# Patient Record
Sex: Female | Born: 1960
Health system: Southern US, Community
[De-identification: ages and names within clinical notes are randomized; demographics above are authoritative.]

## PROBLEM LIST (undated history)

## (undated) DIAGNOSIS — F32A Depression, unspecified: Secondary | ICD-10-CM

## (undated) DIAGNOSIS — J449 Chronic obstructive pulmonary disease, unspecified: Secondary | ICD-10-CM

## (undated) DIAGNOSIS — F988 Other specified behavioral and emotional disorders with onset usually occurring in childhood and adolescence: Secondary | ICD-10-CM

## (undated) DIAGNOSIS — F209 Schizophrenia, unspecified: Secondary | ICD-10-CM

## (undated) DIAGNOSIS — R569 Unspecified convulsions: Secondary | ICD-10-CM

## (undated) DIAGNOSIS — F329 Major depressive disorder, single episode, unspecified: Secondary | ICD-10-CM

## (undated) DIAGNOSIS — F319 Bipolar disorder, unspecified: Secondary | ICD-10-CM

## (undated) DIAGNOSIS — F419 Anxiety disorder, unspecified: Secondary | ICD-10-CM

## (undated) DIAGNOSIS — G8929 Other chronic pain: Secondary | ICD-10-CM

## (undated) DIAGNOSIS — F1111 Opioid abuse, in remission: Secondary | ICD-10-CM

## (undated) HISTORY — PX: BACK SURGERY: SHX140

---

## 2000-09-10 ENCOUNTER — Encounter: Payer: Self-pay | Admitting: Internal Medicine

## 2000-09-10 ENCOUNTER — Ambulatory Visit (HOSPITAL_COMMUNITY): Admission: RE | Admit: 2000-09-10 | Discharge: 2000-09-10 | Payer: Self-pay | Admitting: Internal Medicine

## 2000-11-09 ENCOUNTER — Emergency Department (HOSPITAL_COMMUNITY): Admission: EM | Admit: 2000-11-09 | Discharge: 2000-11-09 | Payer: Self-pay | Admitting: *Deleted

## 2000-11-09 ENCOUNTER — Encounter: Payer: Self-pay | Admitting: *Deleted

## 2002-03-20 ENCOUNTER — Emergency Department (HOSPITAL_COMMUNITY): Admission: EM | Admit: 2002-03-20 | Discharge: 2002-03-20 | Payer: Self-pay | Admitting: Emergency Medicine

## 2003-07-17 ENCOUNTER — Other Ambulatory Visit: Admission: RE | Admit: 2003-07-17 | Discharge: 2003-07-17 | Payer: Self-pay | Admitting: Internal Medicine

## 2005-10-06 ENCOUNTER — Ambulatory Visit (HOSPITAL_COMMUNITY): Admission: RE | Admit: 2005-10-06 | Discharge: 2005-10-06 | Payer: Self-pay | Admitting: Family Medicine

## 2006-11-09 ENCOUNTER — Ambulatory Visit: Payer: Self-pay | Admitting: Pain Medicine

## 2010-04-25 ENCOUNTER — Encounter: Payer: Self-pay | Admitting: Family Medicine

## 2011-08-05 ENCOUNTER — Encounter (HOSPITAL_COMMUNITY): Payer: Self-pay | Admitting: *Deleted

## 2011-08-05 ENCOUNTER — Emergency Department (HOSPITAL_COMMUNITY)
Admission: EM | Admit: 2011-08-05 | Discharge: 2011-08-05 | Disposition: A | Discharge status: Other Acute Inpatient Hospital | Payer: Medicare Other | Source: Home / Self Care

## 2011-08-05 ENCOUNTER — Encounter (HOSPITAL_COMMUNITY): Payer: Self-pay | Admitting: Emergency Medicine

## 2011-08-05 ENCOUNTER — Inpatient Hospital Stay (HOSPITAL_COMMUNITY)
Admission: AD | Admit: 2011-08-05 | Discharge: 2011-08-08 | DRG: 885 | Disposition: A | Discharge status: Home / Self Care | Payer: Medicare Other | Source: Ambulatory Visit | Attending: Psychiatry | Admitting: Psychiatry

## 2011-08-05 DIAGNOSIS — Z91199 Patient's noncompliance with other medical treatment and regimen due to unspecified reason: Secondary | ICD-10-CM

## 2011-08-05 DIAGNOSIS — F1111 Opioid abuse, in remission: Secondary | ICD-10-CM | POA: Diagnosis present

## 2011-08-05 DIAGNOSIS — Z9119 Patient's noncompliance with other medical treatment and regimen: Secondary | ICD-10-CM

## 2011-08-05 DIAGNOSIS — F988 Other specified behavioral and emotional disorders with onset usually occurring in childhood and adolescence: Secondary | ICD-10-CM | POA: Diagnosis present

## 2011-08-05 DIAGNOSIS — G8929 Other chronic pain: Secondary | ICD-10-CM | POA: Insufficient documentation

## 2011-08-05 DIAGNOSIS — F17200 Nicotine dependence, unspecified, uncomplicated: Secondary | ICD-10-CM | POA: Diagnosis present

## 2011-08-05 DIAGNOSIS — F200 Paranoid schizophrenia: Secondary | ICD-10-CM | POA: Diagnosis present

## 2011-08-05 DIAGNOSIS — Z79899 Other long term (current) drug therapy: Secondary | ICD-10-CM | POA: Insufficient documentation

## 2011-08-05 DIAGNOSIS — F259 Schizoaffective disorder, unspecified: Principal | ICD-10-CM | POA: Diagnosis present

## 2011-08-05 DIAGNOSIS — F319 Bipolar disorder, unspecified: Secondary | ICD-10-CM | POA: Insufficient documentation

## 2011-08-05 DIAGNOSIS — R443 Hallucinations, unspecified: Secondary | ICD-10-CM | POA: Insufficient documentation

## 2011-08-05 DIAGNOSIS — Z9114 Patient's other noncompliance with medication regimen: Secondary | ICD-10-CM

## 2011-08-05 HISTORY — DX: Opioid abuse, in remission: F11.11

## 2011-08-05 HISTORY — DX: Bipolar disorder, unspecified: F31.9

## 2011-08-05 HISTORY — DX: Other chronic pain: G89.29

## 2011-08-05 HISTORY — DX: Other specified behavioral and emotional disorders with onset usually occurring in childhood and adolescence: F98.8

## 2011-08-05 LAB — CBC
HCT: 42.8 % (ref 36.0–46.0)
MCH: 32.2 pg (ref 26.0–34.0)
MCHC: 34.3 g/dL (ref 30.0–36.0)
MCV: 93.9 fL (ref 78.0–100.0)
Platelets: 397 10*3/uL (ref 150–400)
RDW: 13.6 % (ref 11.5–15.5)
WBC: 9.4 10*3/uL (ref 4.0–10.5)

## 2011-08-05 LAB — COMPREHENSIVE METABOLIC PANEL
AST: 23 U/L (ref 0–37)
Albumin: 3.3 g/dL — ABNORMAL LOW (ref 3.5–5.2)
BUN: 7 mg/dL (ref 6–23)
Calcium: 9.3 mg/dL (ref 8.4–10.5)
Creatinine, Ser: 0.65 mg/dL (ref 0.50–1.10)
Total Bilirubin: 0.2 mg/dL — ABNORMAL LOW (ref 0.3–1.2)
Total Protein: 7.4 g/dL (ref 6.0–8.3)

## 2011-08-05 LAB — RAPID URINE DRUG SCREEN, HOSP PERFORMED
Amphetamines: POSITIVE — AB
Cocaine: NOT DETECTED
Opiates: NOT DETECTED
Tetrahydrocannabinol: NOT DETECTED

## 2011-08-05 LAB — ETHANOL: Alcohol, Ethyl (B): <11 mg/dL (ref 0–11)

## 2011-08-05 MED ORDER — ACETAMINOPHEN 325 MG PO TABS: 650.0000 mg | ORAL_TABLET | ORAL | Status: DC | PRN

## 2011-08-05 MED ORDER — METHYLPHENIDATE HCL 20 MG PO TABS: 30.0000 mg | ORAL_TABLET | Freq: Two times a day (BID) | ORAL | Status: DC

## 2011-08-05 MED ORDER — ZIPRASIDONE HCL 80 MG PO CAPS
80.0000 mg | ORAL_CAPSULE | Freq: Every day | ORAL | Status: DC
  Filled 2011-08-05: qty 1

## 2011-08-05 MED ORDER — LORAZEPAM 1 MG PO TABS: 1.0000 mg | ORAL_TABLET | Freq: Three times a day (TID) | ORAL | Status: DC | PRN

## 2011-08-05 MED ORDER — ASPIRIN-ACETAMINOPHEN-CAFFEINE 250-250-65 MG PO TABS
2.0000 | ORAL_TABLET | Freq: Four times a day (QID) | ORAL | Status: DC | PRN
  Administered 2011-08-05 – 2011-08-08 (×10): 2 via ORAL
  Filled 2011-08-05 (×5): qty 2

## 2011-08-05 MED ORDER — ALUM & MAG HYDROXIDE-SIMETH 200-200-20 MG/5ML PO SUSP: 30.0000 mL | ORAL | Status: DC | PRN

## 2011-08-05 MED ORDER — CLONAZEPAM 1 MG PO TABS
1.0000 mg | ORAL_TABLET | Freq: Once | ORAL | Status: AC
  Administered 2011-08-05 – 2011-08-06 (×2): 1 mg via ORAL
  Filled 2011-08-05: qty 1

## 2011-08-05 MED ORDER — FLUOXETINE HCL 20 MG PO CAPS
40.0000 mg | ORAL_CAPSULE | Freq: Every day | ORAL | Status: DC
  Administered 2011-08-06 – 2011-08-08 (×3): 40 mg via ORAL
  Filled 2011-08-05 (×6): qty 2

## 2011-08-05 MED ORDER — FLUOXETINE HCL 20 MG PO CAPS
40.0000 mg | ORAL_CAPSULE | Freq: Every day | ORAL | Status: DC
  Administered 2011-08-05: 40 mg via ORAL
  Filled 2011-08-05: qty 2

## 2011-08-05 MED ORDER — NICOTINE 21 MG/24HR TD PT24: 21.0000 mg | MEDICATED_PATCH | Freq: Every day | TRANSDERMAL | Status: DC

## 2011-08-05 MED ORDER — ZIPRASIDONE HCL 60 MG PO CAPS
60.0000 mg | ORAL_CAPSULE | Freq: Once | ORAL | Status: AC
  Administered 2011-08-05: 60 mg via ORAL
  Filled 2011-08-05 (×2): qty 1

## 2011-08-05 MED ORDER — MAGNESIUM HYDROXIDE 400 MG/5ML PO SUSP
30.0000 mL | Freq: Every day | ORAL | Status: DC | PRN
Start: 2011-08-05 — End: 2011-08-08

## 2011-08-05 MED ORDER — ONDANSETRON HCL 8 MG PO TABS: 4.0000 mg | ORAL_TABLET | Freq: Three times a day (TID) | ORAL | Status: DC | PRN

## 2011-08-05 MED ORDER — ZIPRASIDONE MESYLATE 20 MG IM SOLR
20.0000 mg | Freq: Once | INTRAMUSCULAR | Status: AC
  Administered 2011-08-05: 20 mg via INTRAMUSCULAR
  Filled 2011-08-05: qty 20

## 2011-08-05 NOTE — ED Notes (Signed)
Per Irving Burton from ACT team, patient has been accepted at behavioral health pending she is agreeable to stopping her suboxone and ritalin prescriptions. Pt very agitated and yelling she doesn't want to stop these medications. Pt educated that her withdrawal symptoms would be managed by her doctors. Pt finally agreeable to plan to transfer to [facility]. Irving Burton made aware and will start paper work.

## 2011-08-05 NOTE — ED Notes (Signed)
Assumed care of patient from [place], [place]. Pt very pleasant. States she was told by her psychiatrist she needed to come to the ER because she has been hearing voices for the last 2 months. Pt states the voices having been accusing her of selling oxycodone and tell her the police are watching her. Pt reports she has been clean from drugs and alcohol for 5 years. Pt denies suicidal or homicidal ideation. Pt expressed no needs at present. Will continue to monitor.

## 2011-08-05 NOTE — BH Assessment (Signed)
Assessment Note   Christine Ortega is an 51 y.o. female that presents at the request of her Psychiatrist and PCP to address increasing A/V hallucinations and "voices that are driving me crazy."  Pt has become increasingly paranoid, irritable, and agitated while in ED and believes that the staff are talking negatively about her.  Pt admits that she attempted to wean herself off of her Geodon "because it was just one more pill that I was forced to take."  Pt realizes now that "this was a bad idea."  Pt has been responding to internal stimuli while in ED and is yelling at and threatening the voices.  Pt denies SI, HI, or previous history of same.  Pt does admits history of Opioid Abuse, but is currently taking prescribed Suboxene "religiously" and voices over five years of sobriety from ETOH and "pain pills."  Pt is currently able to contract for safety but is becoming difficult to verbal handle, as she continued to scream and yell at "the voices" until she was given 20mg  of Geodon IM.  Pt is also requesting her Flexeril and her Ritalin and "then I will be A-okay."  Telepsych requested by Grant Fontana, PA-C; awaiting disposition regarding medication and placement needs.  However, pt would benefit from inpatient care to treat current and worsening symptoms.  Axis I: Mood Disorder NOS Axis II: Deferred Axis III:  Past Medical History  Diagnosis Date  . Narcotic abuse in remission   . Bipolar disorder   . Chronic pain   . ADD (attention deficit disorder)    Axis IV: other psychosocial or environmental problems, problems related to social environment, problems with access to health care services and problems with primary support group Axis V: 21-30 behavior considerably influenced by delusions or hallucinations OR serious impairment in judgment, communication OR inability to function in almost all areas  Past Medical History:  Past Medical History  Diagnosis Date  . Narcotic abuse in remission     . Bipolar disorder   . Chronic pain   . ADD (attention deficit disorder)     History reviewed. No pertinent past surgical history.  Family History: History reviewed. No pertinent family history.  Social History:  reports that she has been smoking.  She does not have any smokeless tobacco history on file. She reports that she does not drink alcohol or use illicit drugs.  Additional Social History:    Allergies:  Allergies  Allergen Reactions  . Ibuprofen Anaphylaxis    Home Medications:  (Not in a hospital admission)  OB/GYN Status:  No LMP recorded.  General Assessment Data Location of Assessment: [facility] ED Living Arrangements: Alone Can pt return to current living arrangement?: Yes Admission Status: Voluntary Is patient capable of signing voluntary admission?: Yes Transfer from: Acute Hospital Referral Source: MD  Education Status Is patient currently in school?: No  Risk to self Suicidal Ideation: No Suicidal Intent: No Is patient at risk for suicide?: No Suicidal Plan?: No Access to Means: No What has been your use of drugs/alcohol within the last 12 months?:  (history of Opioid Abuse; on Syboxen) Previous Attempts/Gestures: No How many times?: 0  Other Self Harm Risks: reckless/ A/V hallucinations Family Suicide History: No Recent stressful life event(s): Conflict (Comment);Recent negative physical changes Persecutory voices/beliefs?: Yes Depression: Yes Depression Symptoms: Insomnia;Tearfulness;Guilt;Feeling worthless/self pity;Feeling angry/irritable Substance abuse history and/or treatment for substance abuse?: Yes Suicide prevention information given to non-admitted patients: Not applicable  Risk to Others Homicidal Ideation: No Thoughts of Harm  to Others: No Current Homicidal Intent: No Current Homicidal Plan: No Access to Homicidal Means: No History of harm to others?: No Assessment of Violence: None Noted Violent Behavior Description: none  noted Does patient have access to weapons?: No Criminal Charges Pending?: No Does patient have a court date: No  Psychosis Hallucinations: Auditory;With command Delusions: Persecutory  Mental Status Report Appear/Hygiene: Improved Eye Contact: Good Motor Activity: Agitation;Restlessness Speech: Argumentative;Loud Level of Consciousness: Alert;Restless;Crying;Irritable Mood: Depressed;Anxious;Suspicious;Angry;Despair Affect: Apathetic;Depressed;Irritable;Inconsistent with thought content;Preoccupied;Sad;Threatening Anxiety Level: Severe Thought Processes: Irrelevant;Flight of Ideas Judgement: Impaired Orientation: Person;Place;Time;Situation Obsessive Compulsive Thoughts/Behaviors: Severe  Cognitive Functioning Concentration: Decreased Memory: Recent Impaired;Remote Impaired IQ: Average Insight: Poor Impulse Control: Poor Appetite: Good Weight Loss: 0  Weight Gain: 0  Sleep: Decreased Total Hours of Sleep: 4  Vegetative Symptoms: None  Prior Inpatient Therapy Prior Inpatient Therapy: No Prior Therapy Dates: n/a Prior Therapy Facilty/Provider(s): none Reason for Treatment: n/a  Prior Outpatient Therapy Prior Outpatient Therapy: Yes Prior Therapy Dates: currently Prior Therapy Facilty/Provider(s): Dr. Orvan Falconer Reason for Treatment: depression/anxiety/SA            Values / Beliefs Cultural Requests During Hospitalization: None Spiritual Requests During Hospitalization: None        Additional Information 1:1 In Past 12 Months?: No CIRT Risk: No Elopement Risk: No Does patient have medical clearance?: Yes     Disposition:  Disposition Disposition of Patient: Referred to Patient referred to: Other (Comment)  On Site Evaluation by:   Reviewed with Physician:     Angelica Ran 08/05/2011 3:38 PM

## 2011-08-05 NOTE — ED Notes (Signed)
Pt here with auditory hallucinations and paranoia x 2 months that went to PCP for today and sent here for eval; pt denies SI/HI; pt calm cooperative; pt sts hx of narcotic abuse in past but now on suboxone

## 2011-08-05 NOTE — ED Notes (Signed)
Pt found agitated, yelling in room at no one. Pt obviously having auditory hallucinations, very tearful and paranoid staff is talking about her. Grant Fontana PA-C made aware. New orders received to medicate patient with geodon.

## 2011-08-05 NOTE — ED Provider Notes (Signed)
History     CSN: [id number]  Arrival date & time 08/05/11  1043   First MD Initiated Contact with Patient 08/05/11 1228      Chief Complaint  Patient presents with  . Medical Clearance  . Hallucinations    (Consider location/radiation/quality/duration/timing/severity/associated sxs/prior treatment) HPI Hx from pt. 51 year old female with past medical history of bipolar disorder, ADD presents for medical clearance with c/o hallucinations. She states that this began about 2 months ago. She states that she has a remote history of opioid abuse, and has not used opioids within the past 5 years. She is having hallucinations, both auditory and visual, of policemen coming to her house, accusing her of selling oxycodone. States they are in the woods outside her house and have come into her attic. She states "I know this is really silly. My family tells me that they can't see them and they make fun of me." States "I feel like I'm having a psychotic break."   She has been on Geodon for about the past 15 years for her bipolar, but has been "weaning herself off" over about the past month. She is unable to tell me a reason why she has done this. She has never had hallucinations in the past. Denies any recreational drug use or alcohol use. Denies suicidal or homicidal ideation. She has no somatic complaints.  Past Medical History  Diagnosis Date  . Narcotic abuse in remission   . Bipolar disorder   . Chronic pain   . ADD (attention deficit disorder)     History reviewed. No pertinent past surgical history.  History reviewed. No pertinent family history.  History  Substance Use Topics  . Smoking status: Current Everyday Smoker  . Smokeless tobacco: Not on file  . Alcohol Use: No    OB History    Grav Para Term Preterm Abortions TAB SAB Ect Mult Living                  Review of Systems  Constitutional: Negative.   HENT: Negative.   Eyes: Negative.   Respiratory: Negative for cough,  chest tightness and shortness of breath.   Cardiovascular: Negative for chest pain and palpitations.  Gastrointestinal: Negative for nausea, vomiting and abdominal pain.  Musculoskeletal: Negative for myalgias.  Skin: Negative for color change and rash.  Neurological: Negative for weakness and headaches.    Allergies  Ibuprofen  Home Medications   Current Outpatient Rx  Name Route Sig Dispense Refill  . ASPIRIN-ACETAMINOPHEN-CAFFEINE 250-250-65 MG PO TABS Oral Take 2 tablets by mouth 4 (four) times daily.    Marland Kitchen BUPRENORPHINE HCL-NALOXONE HCL 8-2 MG SL FILM Sublingual Place 1 Film under the tongue daily.    Marland Kitchen CLONAZEPAM 1 MG PO TABS Oral Take 1 mg by mouth 3 (three) times daily.    Marland Kitchen FLUOXETINE HCL 40 MG PO CAPS Oral Take 40 mg by mouth daily.    . METHYLPHENIDATE HCL 20 MG PO TABS Oral Take 30 mg by mouth 2 (two) times daily.    Marland Kitchen ZIPRASIDONE HCL 80 MG PO CAPS Oral Take 80 mg by mouth at bedtime.      BP 110/76  Pulse 96  Temp(Src) 97.8 F (36.6 C) (Oral)  Resp 18  SpO2 98%  Physical Exam  Nursing note and vitals reviewed. Constitutional: She is oriented to person, place, and time. She appears well-developed and well-nourished. No distress.  HENT:  Head: Normocephalic and atraumatic.  Eyes: EOM are normal. Pupils are equal, round,  and reactive to light.  Neck: Normal range of motion. Neck supple.  Cardiovascular: Normal rate, regular rhythm and normal heart sounds.   Pulmonary/Chest: Effort normal and breath sounds normal. She exhibits no tenderness.  Abdominal: Soft. Bowel sounds are normal. There is no tenderness. There is no rebound and no guarding.  Musculoskeletal: Normal range of motion.  Neurological: She is alert and oriented to person, place, and time. No cranial nerve deficit. She exhibits normal muscle tone. Coordination normal.  Skin: Skin is warm and dry. She is not diaphoretic.  Psychiatric: She has a normal mood and affect.       Pt cooperative, not actively  hallucinating at this time    ED Course  Procedures (including critical care time)  Labs Reviewed  COMPREHENSIVE METABOLIC PANEL - Abnormal; Notable for the following:    Potassium 3.3 (*)    Albumin 3.3 (*)    Alkaline Phosphatase 157 (*)    Total Bilirubin 0.2 (*)    All other components within normal limits  URINE RAPID DRUG SCREEN (HOSP PERFORMED) - Abnormal; Notable for the following:    Amphetamines POSITIVE (*)    All other components within normal limits  CBC  ETHANOL  ACETAMINOPHEN LEVEL  Pt is on Ritalin to explain +UDS  No results found.   No diagnosis found.    MDM  Pt with c/o hallucinations. Medically clear for eval. Discussed with ACT counselor; telepsych ordered.        Grant Fontana, [place] 08/05/11 1556

## 2011-08-06 DIAGNOSIS — F3160 Bipolar disorder, current episode mixed, unspecified: Secondary | ICD-10-CM

## 2011-08-06 DIAGNOSIS — Z9114 Patient's other noncompliance with medication regimen: Secondary | ICD-10-CM | POA: Insufficient documentation

## 2011-08-06 MED ORDER — CYCLOBENZAPRINE HCL 10 MG PO TABS
10.0000 mg | ORAL_TABLET | Freq: Three times a day (TID) | ORAL | Status: DC
  Administered 2011-08-06 – 2011-08-07 (×6): 10 mg via ORAL
  Filled 2011-08-06 (×12): qty 1

## 2011-08-06 MED ORDER — CLONAZEPAM 1 MG PO TABS
1.0000 mg | ORAL_TABLET | Freq: Three times a day (TID) | ORAL | Status: DC
  Administered 2011-08-06 – 2011-08-07 (×5): 1 mg via ORAL
  Filled 2011-08-06 (×5): qty 1

## 2011-08-06 MED ORDER — CYCLOBENZAPRINE HCL 10 MG PO TABS
10.0000 mg | ORAL_TABLET | Freq: Once | ORAL | Status: DC
  Filled 2011-08-06: qty 1

## 2011-08-06 MED ORDER — ZIPRASIDONE HCL 80 MG PO CAPS
80.0000 mg | ORAL_CAPSULE | Freq: Every day | ORAL | Status: DC
  Administered 2011-08-06 – 2011-08-07 (×2): 80 mg via ORAL
  Filled 2011-08-06 (×5): qty 1

## 2011-08-06 MED ORDER — FLUOXETINE HCL 40 MG PO CAPS: 40.0000 mg | ORAL_CAPSULE | Freq: Every day | ORAL | Status: DC

## 2011-08-06 MED ORDER — CLONAZEPAM 1 MG PO TABS
ORAL_TABLET | ORAL | Status: AC
  Administered 2011-08-06: 1 mg via ORAL
  Filled 2011-08-06: qty 1

## 2011-08-06 MED ORDER — CLONAZEPAM 1 MG PO TABS: 1.0000 mg | ORAL_TABLET | Freq: Three times a day (TID) | ORAL | Status: DC

## 2011-08-06 MED ORDER — ZIPRASIDONE HCL 80 MG PO CAPS: 80.0000 mg | ORAL_CAPSULE | Freq: Every day | ORAL | Status: DC

## 2011-08-06 NOTE — H&P (Signed)
Psychiatric Admission Assessment Adult  Patient Identification:  Christine Ortega Date of Evaluation:  08/06/2011 51 yo MWF  CC: hallucinations  History of Present Illness: Weaned herself off Geodon that she has taken for 15 years this past month. Now relizes this was a bad idea.  Is now having hallucinations- both auditory and visual of policemen coming to her house accusing her of selling Oxycodone.She has not used opioids for at least 5 years. Feels like she is having a psychotic break.     Past Psychiatric History: Says sh has been admitted for rehab purposes but not for at least 5 years. Diagnosed as Bipolar & ADD age 85.   Substance Abuse History: 5 years since last Opioid use.   Social History:    reports that she has been smoking.  She does not have any smokeless tobacco history on file. She reports that she does not drink alcohol or use illicit drugs. Married once has  daughters 9 & 65 one son age 81.  Gets SSDI for low back pain had a tumor removed and this caused opiate dependence.    Family Psych History: Denies   Past Medical History:     Past Medical History  Diagnosis Date  . Narcotic abuse in remission   . Bipolar disorder   . Chronic pain   . ADD (attention deficit disorder)        Past Surgical History  Procedure Date  . Back surgery     Allergies:  Allergies  Allergen Reactions  . Ibuprofen Anaphylaxis  . Nsaids Anaphylaxis    Allergic to NSAIDs except for Excedrin    Current Medications:  Prior to Admission medications   Medication Sig Start Date End Date Taking? Authorizing Provider  aspirin-acetaminophen-caffeine (EXCEDRIN MIGRAINE) [phone] MG per tablet Take 2 tablets by mouth 4 (four) times daily.    Historical Provider, MD  Buprenorphine HCl-Naloxone HCl (SUBOXONE) 8-2 MG FILM Place 1 Film under the tongue daily.    Historical Provider, MD  clonazePAM (KLONOPIN) 1 MG tablet Take 1 mg by mouth 3 (three) times daily.    Historical  Provider, MD  FLUoxetine (PROZAC) 40 MG capsule Take 40 mg by mouth daily.    Historical Provider, MD  methylphenidate (RITALIN) 20 MG tablet Take 30 mg by mouth 2 (two) times daily.    Historical Provider, MD  ziprasidone (GEODON) 80 MG capsule Take 80 mg by mouth at bedtime.    Historical Provider, MD    Mental Status Examination/Evaluation: Objective:  Appearance: Disheveled  Psychomotor Activity:  Mannerisms  Eye Contact::  Good  Speech:  Clear and Coherent  Volume:  Normal  Mood:ramps up easily     Affect:  Full Range  Thought Process: clear rational goal oriented - asks for discharge   Orientation:  Full  Thought Content:  Hallucinations: Auditory Visual  Suicidal Thoughts:  No  Homicidal Thoughts:  No  Judgement:  Fair  Insight:  Fair    DIAGNOSIS:    AXIS I Bipolar, mixed  AXIS II Deferred  AXIS III See medical history.  AXIS IV other psychosocial or environmental problems, problems with access to health care services and problems with primary support group  AXIS V 21-30 behavior considerably influenced by delusions or hallucinations OR serious impairment in judgment, communication OR inability to function in almost all areas     Treatment Plan Summary: Admit for safety & stabilization  Dr.Zarzar had already restarted Prozac 40 mg QAm Flexeril 10 mg TID Geodon 80  mg at hs and Klonopin 1mg  TID.Ritalin and Suboxone to be held while inpatient.

## 2011-08-06 NOTE — Progress Notes (Signed)
[facility] Group Notes:  (Counselor/Nursing/MHT/Case Management/Adjunct)  08/06/2011 4:31 PM  Type of Therapy:  Group Therapy  Participation Level:  Active  Participation Quality:  Appropriate  Affect:  Appropriate  Cognitive:  Appropriate  Insight:  Good  Engagement in Group:  Good  Engagement in Therapy:  Good  Modes of Intervention:  Clarification, Problem-solving, Socialization and Support  Summary of Progress/Problems:Pt.  participated in counseling group on self sabotaging behaviors and how to support themselves in a positive way. Pt. spoke about how she does not take her medication like she should as a way she self sabotage herself. Pt.s spoke about taking her medications when she is supposed to as a way of positively supporting her self when needed.  Neila Gear 08/06/2011, 4:31 PM

## 2011-08-06 NOTE — [facility] Suicide Risk Assessment (Signed)
Suicide Risk Assessment  Admission Assessment     Demographic factors:  Assessment Details Time of Assessment: Admission Information Obtained From: Patient Current Mental Status:    Loss Factors:  Loss Factors: Decline in physical health Historical Factors:  Historical Factors: Family history of mental illness or substance abuse Risk Reduction Factors:  Risk Reduction Factors: Responsible for children under 51 years of age;Sense of responsibility to family;Positive social support;Positive therapeutic relationship  CLINICAL FACTORS:   Bipolar Disorder:   Depressive phase Alcohol/Substance Abuse/Dependencies Chronic Pain More than one psychiatric diagnosis  Christine Ortega was seen and assessed today. She reports self-discontinuing Geodon, thinking that she could live without it, and then having worsening auditory hallucinations of voices talking about her prior addiction. She denies AVH today. She slept well last night and reports good mood, appetite, and energy level. She does complain of back pain and requests Flexeril. She also asks if Ritalin and Suboxone can be restarted and I explained that we would not be restarting these medications here. I agreed to restart Klonopin after confirming the patient's dose through the [place] controlled substance database.  COGNITIVE FEATURES THAT CONTRIBUTE TO RISK:  Closed-mindedness    SUICIDE RISK:   Mild:  Suicidal ideation of limited frequency, intensity, duration, and specificity.  There are no identifiable plans, no associated intent, mild dysphoria and related symptoms, good self-control (both objective and subjective assessment), few other risk factors, and identifiable protective factors, including available and accessible social support.  PLAN OF CARE: 1. Restart Prozac 40 mg qam, Flexeril 10 mg TID, Geodon 80 mg qhs, and Klonopin 1 mg po TID, which are the patient's outpatient meds. Will not restart Suboxone or Ritalin while inpatient. 2. Continue  q15 minute observation for safety. The patient currently denies SI/HI. 3. Encouraged participation in [facility] groups. 4. Reviewed admission labs.  Christine Ortega 08/06/2011, 1:13 PM

## 2011-08-06 NOTE — H&P (Signed)
I have read the H&P, interviewed the patient, and I agree with the findings above.  Jamerson Vonbargen, MD   

## 2011-08-06 NOTE — ED Provider Notes (Signed)
Medical screening examination/treatment/procedure(s) were performed by non-physician practitioner and as supervising physician I was immediately available for consultation/collaboration.   Alassane Kalafut A Farha Dano, MD 08/06/11 1242 

## 2011-08-06 NOTE — Progress Notes (Signed)
[facility] Group Notes:  (Counselor/Nursing/MHT/Case Management/Adjunct)  08/06/2011 4:54 PM  Type of Therapy:  After care Planning group    Summary of Progress/Problems: Pt. participated in after care planning group. Pt. Was give the [facility] SI  Prevention information and the crisis and hot line numbers. Pt. Agreed to use them if needed.  The pt. Spoke about being at hospital due to not taking her mediations. Pt. Denied SI or HI. Pt. Was told she would see a doctor today.  Neila Gear 08/06/2011, 4:54 PM

## 2011-08-06 NOTE — Progress Notes (Signed)
Pt has been very anxious and upset today  She was asking for her medications ritalin and suboxone to be restarted but then later admitted to wanting to come off the suboxone   Her mood is labile  She has been tearful and saying staff just doesn't care about her   Then she becomes calm and logical   She does talk about all the stress she has at home with a disabled 51 year old son  She reports having a good relationship with her husband  She said she made a mistake going off her geodon and plans to be compliant with medications once discharged   Verbal support given   Medications administered and effectiveness monitored  Q 15 min checks   Pt safe at present

## 2011-08-07 MED ORDER — CYCLOBENZAPRINE HCL 10 MG PO TABS
10.0000 mg | ORAL_TABLET | ORAL | Status: DC
  Administered 2011-08-07 – 2011-08-08 (×3): 10 mg via ORAL
  Filled 2011-08-07 (×8): qty 1

## 2011-08-07 MED ORDER — CLONAZEPAM 1 MG PO TABS
1.0000 mg | ORAL_TABLET | ORAL | Status: DC
  Administered 2011-08-07 – 2011-08-08 (×3): 1 mg via ORAL
  Filled 2011-08-07 (×3): qty 1

## 2011-08-07 NOTE — Progress Notes (Signed)
[facility] MD Progress Note  08/07/2011 11:45 AM  ADL's:  Intact  Sleep: Good  Appetite:  Good  Suicidal Ideation:  Plan:  No Intent:  No Means:  No Homicidal Ideation:  Plan:  No Intent:  No Means:  No  Christine Ortega reports a "pretty good" mood today. She slept fairly well last night and her appetite is good. She reports feeling ready for discharge. She denies adverse effects to medications and denies SI/HI and AVH. Reports she is glad she came in to get help when she did and says she knows she needs to "stick with the medications. I've learned my lesson." Patient has an appointment with her family doctor, Dr. Orvan Falconer on 5/17, and says she wants his input prior to choosing a community mental health provider.  Mental Status Examination/Evaluation: Objective:  Appearance: Casual and Fairly Groomed  Eye Contact::  Good  Speech:  Clear and Coherent and Normal Rate  Volume:  Normal  Mood:  Euthymic  Affect:  Full Range  Thought Process:  Coherent, Goal Directed and Linear  Orientation:  Full  Thought Content:  No SI/HI, no delusions, no AVH, not responding to internal stimuli  Suicidal Thoughts:  No  Homicidal Thoughts:  No  Judgement:  Fair  Insight:  Fair  Psychomotor Activity:  Normal  Concentration:  Good  Sleep:  Number of Hours: 5.75    Vital Signs:Blood pressure 92/66, pulse 87, temperature 98.5 F (36.9 C), temperature source Oral, resp. rate 16, height 6\' 1"  (1.854 m), weight 176 lb (79.833 kg). Current Medications: Current Facility-Administered Medications  Medication Dose Route Frequency Provider Last Rate Last Dose  . alum & mag hydroxide-simeth (MAALOX/MYLANTA) 200-200-20 MG/5ML suspension 30 mL  30 mL Oral Q4H PRN Cleotis Nipper, MD      . aspirin-acetaminophen-caffeine (EXCEDRIN MIGRAINE) per tablet 2 tablet  2 tablet Oral QID PRN Cleotis Nipper, MD   2 tablet at 08/07/11 [id number]  . clonazePAM (KLONOPIN) tablet 1 mg  1 mg Oral Once Cleotis Nipper, MD   1 mg at 08/06/11 1402    . clonazePAM (KLONOPIN) tablet 1 mg  1 mg Oral TID Ronny Bacon, MD   1 mg at 08/07/11 0811  . cyclobenzaprine (FLEXERIL) tablet 10 mg  10 mg Oral TID Ronny Bacon, MD   10 mg at 08/07/11 0811  . cyclobenzaprine (FLEXERIL) tablet 10 mg  10 mg Oral Once Curlene Labrum Readling, MD      . cyclobenzaprine (FLEXERIL) tablet 10 mg  10 mg Oral Once Ronny Bacon, MD      . FLUoxetine (PROZAC) capsule 40 mg  40 mg Oral Daily Cleotis Nipper, MD   40 mg at 08/07/11 0811  . magnesium hydroxide (MILK OF MAGNESIA) suspension 30 mL  30 mL Oral Daily PRN Cleotis Nipper, MD      . ziprasidone (GEODON) capsule 80 mg  80 mg Oral Q supper Curlene Labrum Readling, MD   80 mg at 08/06/11 1853  . DISCONTD: clonazePAM (KLONOPIN) tablet 1 mg  1 mg Oral TID Mickie D. Adams, PA      . DISCONTD: FLUoxetine (PROZAC) capsule 40 mg  40 mg Oral Daily Mickie D. Adams, PA      . DISCONTD: ziprasidone (GEODON) capsule 80 mg  80 mg Oral QHS Mickie D. Adams, [place]        Lab Results:  Results for orders placed during the hospital encounter of 08/05/11 (from the past 48 hour(s))  URINE  RAPID DRUG SCREEN (HOSP PERFORMED)     Status: Abnormal   Collection Time   08/05/11 11:47 AM      Component Value Range Comment   Opiates NONE DETECTED  NONE DETECTED     Cocaine NONE DETECTED  NONE DETECTED     Benzodiazepines NONE DETECTED  NONE DETECTED     Amphetamines POSITIVE (*) NONE DETECTED     Tetrahydrocannabinol NONE DETECTED  NONE DETECTED     Barbiturates NONE DETECTED  NONE DETECTED     Treatment Plan Summary: Daily contact with patient to assess and evaluate symptoms and progress in treatment Medication management  Plan: 1. Continue medications noted above. 2. Continue q15 minute observation for safety 3. Encouraged participation in [facility] groups  Eligah East 08/07/2011, 11:45 AM

## 2011-08-07 NOTE — Progress Notes (Signed)
Patient ID: Christine Ortega, female   DOB: 01-Oct-1960, 51 y.o.   MRN: [id number] Came to the med window after group this evening, stated she just wanted her meds so she could go to bed. Was told it was a little soon for one, became irritable, said just give her what she could have, took them and walked to her room. Will continue to monitor.

## 2011-08-07 NOTE — Progress Notes (Signed)
[facility] Group Notes:  (Counselor/Nursing/MHT/Case Management/Adjunct)  08/07/2011 7:35 PM  Type of Therapy:  Group Therapy  Participation Level:  Active  Participation Quality:  Appropriate  Affect:  Appropriate  Cognitive:  Appropriate  Insight:  Good  Engagement in Group:  Good  Engagement in Therapy:  Good  Modes of Intervention:  Clarification, Limit-setting, Socialization and Support  Summary of Progress/Problems: Pt. participated in group on supports and how they can support themselves. Each pt. Shared how the person who is a support to them supports them personally. The pt. Spoke about her daughter being a support. Pt.'s daughter is a Engineer, civil (consulting) and she stated her daughter is going to help her with taking her medication properly.  Christine Ortega 08/07/2011, 7:35 PM

## 2011-08-07 NOTE — Progress Notes (Signed)
Pt has been pleasant and mostly appropriate today  She attended and participated in groups   She can be loud and very talkative at times and asks for medications as often as possible   Pt expresses she wants to be discharged tomorrow   Verbal support given  Medications administered and effectiveness monitored  Q 15 min checks  Pt safe at present

## 2011-08-07 NOTE — Progress Notes (Signed)
[facility] Adult Inpatient Family/Significant Other Suicide Prevention Education  Suicide Prevention Education:  Education Completed; Daughter April [phone] has been identified by the patient as the family member/significant other with whom the patient will be residing, and identified as the person(s) who will aid the patient in the event of a mental health crisis (suicidal ideations/suicide attempt).  With written consent from the patient, the family member/significant other has been provided the following suicide prevention education, prior to the and/or following the discharge of the patient.  The suicide prevention education provided includes the following:  Suicide risk factors  Suicide prevention and interventions  National Suicide Hotline telephone number  [facility] assessment telephone number  [facility] Emergency Assistance 911  [facility] and/or Residential Mobile Crisis Unit telephone number  Request made of family/significant other to:  Remove weapons (e.g., guns, rifles, knives), all items previously/currently identified as safety concern.    Remove drugs/medications (over-the-counter, prescriptions, illicit drugs), all items previously/currently identified as a safety concern.  The family member/significant other verbalizes understanding of the suicide prevention education information provided.  The family member/significant other agrees to remove the items of safety concern listed above.  Christine Ortega L 08/07/2011, 4:20 PM

## 2011-08-08 DIAGNOSIS — F1111 Opioid abuse, in remission: Secondary | ICD-10-CM

## 2011-08-08 DIAGNOSIS — F200 Paranoid schizophrenia: Secondary | ICD-10-CM

## 2011-08-08 MED ORDER — ZIPRASIDONE HCL 80 MG PO CAPS: ORAL_CAPSULE | ORAL | Status: DC

## 2011-08-08 MED ORDER — CYCLOBENZAPRINE HCL 10 MG PO TABS: ORAL_TABLET | ORAL | Status: DC

## 2011-08-08 MED ORDER — POTASSIUM CHLORIDE CRYS ER 20 MEQ PO TBCR
20.0000 meq | EXTENDED_RELEASE_TABLET | Freq: Every day | ORAL | Status: DC
  Administered 2011-08-08: 20 meq via ORAL
  Filled 2011-08-08 (×4): qty 1

## 2011-08-08 MED ORDER — CLONAZEPAM 1 MG PO TABS: ORAL_TABLET | ORAL | Status: DC

## 2011-08-08 MED ORDER — FLUOXETINE HCL 40 MG PO CAPS: 40.0000 mg | ORAL_CAPSULE | Freq: Every day | ORAL | Status: AC

## 2011-08-08 NOTE — Discharge Summary (Signed)
Physician Discharge Summary Note  Patient:  Christine Ortega is an 51 y.o., female MRN:  [id number] DOB:  [birth year] Patient phone:  [phone] (home)  Patient address:   [address] [place] [address],   Date of Admission:  08/05/2011 Date of Discharge: 08/08/2011  Reason for Admission: Pt. Was having active psychosis  Discharge Diagnoses: Principal Problem:  *Non compliance w medication regimen   Axis Diagnosis:  Discharge Diagnoses:  AXIS I: Schizophrenia - Paranoid Type.  Narcotic Dependence - In Remission.  AXIS II: Deferred.  AXIS III: 1. Chronic Pain.  AXIS IV: Chronic Mental Illness. Non-compliance with medications.  AXIS V: GAF at time of admission approximately 30. GAF at time of discharge approximately 55.   Level of Care:  OP  Hospital Course:  Christine Ortega was admitted to [facility] after becoming increasingly more and more agitated after she elected to discontinue her Geodon.  She developed auditory and visual hallucinations and was very belligerent to ED staff and was given Geodon IM. After she became stable she was transferred to [facility].  Her home medications were restarted at the regular intervals with the exception of her Ritalin. Christine Ortega slept well and ate well. She interacted with staff and other patients, attended groups and unit programming.  While she was a little intrusive at times she was not at risk for elopement and was not a safety concern.  Her behaviors changed significantly over the weekend. She reported no side effects to the medication and was asking for discharge this morning. Christine Ortega was evaluated by the treatment team and felt safe for discharge this morning with plans for her to follow up with her primary care MD as well as her psychiatrist.  Consults:  None  Significant Diagnostic Studies:  None  Discharge Vitals:   Blood pressure 99/68, pulse 75, temperature 96.9 F (36.1 C), temperature source Oral, resp. rate 20, height 6\' 1"  (1.854 m), weight 79.833 kg  (176 lb).  Mental Status Exam: See Mental Status Examination and Suicide Risk Assessment completed by Attending Physician prior to discharge.  Discharge destination:  Home  Is patient on multiple antipsychotic therapies at discharge:  No   Has Patient had three or more failed trials of antipsychotic monotherapy by history:  No  Recommended Plan for Multiple Antipsychotic Therapies: Not applicable.   Discharge Orders    Future Orders Please Complete By Expires   Diet - low sodium heart healthy      Increase activity slowly      Discharge instructions      Comments:   Take all medication as prescribed.  Keep all follow up appointments as planned in order to get your meds refilled.     Medication List  As of 08/08/2011  1:53 PM   STOP taking these medications         aspirin-acetaminophen-caffeine 250-250-65 MG per tablet      methylphenidate 20 MG tablet      SUBOXONE 8-2 MG Film         TAKE these medications      Indication    clonazePAM 1 MG tablet   Commonly known as: KLONOPIN   Take at 8AM, 2pm, and at bedtime for anxiety.    Indication: Panic Disorder      cyclobenzaprine 10 MG tablet   Commonly known as: FLEXERIL   Take one tablet every eight hours if needed for muscle spasm.    Indication: Muscle Spasm      FLUoxetine 40 MG  capsule   Commonly known as: PROZAC   Take 1 capsule (40 mg total) by mouth daily. For depression and anxiety.    Indication: Depression      ziprasidone 80 MG capsule   Commonly known as: GEODON   Take one capsule at bedtime for depression and mood stability.    Indication: Manic-Depression           Follow-up Information    Follow up with Dr. Junious Dresser on 08/19/2011. (8:15am appointment)    Contact information:   [facility] [address] [place]  [address] Telephone:  [phone]  Fax:  [phone]         Follow-up recommendations:  Activity:  as tolerated. Diet:  heart  healthy.  Comments:  none  Signed: Lloyd Huger T. Jarrod Bodkins PAC for  Dr. Harvie Heck D. Readling 08/08/2011, 1:53 PM

## 2011-08-08 NOTE — Tx Team (Signed)
Interdisciplinary Treatment Plan Update (Adult)  Date:  08/08/2011  Time Reviewed:  10:15AM-11:15AM  Progress in Treatment: Attending groups:  Yes Participating in groups:    Yes, fully engaged Taking medication as prescribed:    Yes, no refusals Tolerating medication:   Yes, no side effects have been reported by patient or noted by staff Family/Significant other contact made:  Will be done prior to discharge Patient understands diagnosis:   Yes Discussing patient identified problems/goals with staff:   Yes Medical problems stabilized or resolved:   Yes Denies suicidal/homicidal ideation:  Yes Issues/concerns per patient self-inventory:   None Other:    New problem(s) identified: No, Describe:    Reason for Continuation of Hospitalization: None  Interventions implemented related to continuation of hospitalization:  Medication monitoring and adjustment, safety checks Q15 min., suicide risk assessment, group therapy, psychoeducation, collateral contact, aftercare planning, ongoing physician assessments, medication education - UNTIL DISCHARGE  Additional comments:  Not applicable  Estimated length of stay:  Discharge today  Discharge Plan:  Return home to live with husband and 3 children, transported by daughter.  Follow up with Dr. [facility]er at [facility].  RN daughter to help with medications.  New goal(s):  Not applicable  Review of initial/current patient goals per problem list:   1.  Goal(s):  Medication stabilization; "I need my meds adjusted and to stay on them."  Met:  Yes  Target date:  By Discharge   As evidenced by:  Feels meds are adjusted, states that she will not go off them again, "That wasn't a good idea."  2.  Goal(s):  Contact family re supports at home  Met:  No  Target date:  By Discharge   As evidenced by:  Needed prior to discharge  3.  Goal(s):  Decrease paranoia to manageable level  Met:  Yes  Target date:  By Discharge     As evidenced by:  Reports none today   Attendees: Patient:  Christine Ortega  08/08/2011 10:15AM-11:15AM  Family:     Physician:  Dr. Harvie Heck Readling 08/08/2011 10:15AM-11:15AM  Nursing:   Tacy Learn, RN 08/08/2011 10:15AM -11:15AM   Case Manager:  Ambrose Mantle, LCSW 08/08/2011 10:15AM-11:15AM  Counselor:  Veto Kemps, MT-BC 08/08/2011 10:15AM-11:15AM  Other:   Verne Spurr, PA 08/08/2011 10:15AM-11:15AM  Other:      Other:      Other:       Scribe for Treatment Team:   Sarina Ser, 08/08/2011, 10:15AM-11:15AM

## 2011-08-08 NOTE — Progress Notes (Signed)
Patient ID: Christine Ortega, female   DOB: 19-Oct-1960, 51 y.o.   MRN: [id number] Was out and about on hall this evening and in dayroom, laughing loudly at times and can be intrusive.  Interacting with select peers, has been to group this evening and came to med window for exedrin for back pain but didn't come back for the rest of her meds. They were taken to her as she had gotten into bed, and she was appreciative. No other c/o's voiced, denies SI/HI, voices.  Will continue to monitor.

## 2011-08-08 NOTE — [facility] Suicide Risk Assessment (Signed)
Suicide Risk Assessment  Discharge Assessment     Demographic factors:  Caucasian;Unemployed  Current Mental Status Per Nursing Assessment::   On Admission:    At Discharge:  The patient is AO x 3.  She was friendly and cooperative and denies any depressive symptoms as well as any SI/HI.  She also denies any auditory or visual hallucinations or delusional thinking.  She does report some mild anxiety.  Current Mental Status Per Physician:  Diagnosis:  Axis I:  Schizophrenia - Paranoid Type. Narcotic Dependence - In Remission.  The patient was seen today and reports the following:   ADL's: Intact.  Sleep: The patient reports to sleeping very well last night without difficulty.  Appetite: The patient reports a good appetite.   Mild>(1-10) >Severe  Hopelessness (1-10): 0  Depression (1-10): 0  Anxiety (1-10): 2   Suicidal Ideation: The patient adamantly denies any suicidal ideations today.  Plan: No.  Intent: No.  Means: No.   Homicidal Ideation: The patient adamantly denies any homicidal ideations.  Plan: No  Intent: No.  Means: No   General Appearance /Behavior: Casual and cooperative today with this provider.  Eye Contact: Good.  Speech: Appropriate in rate and volume with no pressuring noted.  Motor Behavior: Appropriate.  Level of Consciousness: Alert and Oriented x 3.  Mental Status: Alert and Oriented x 3.  Mood: Euthymic.  Affect: Bright and cheerful.  Anxiety Level: Mild anxiety reported today.  Thought Process: wnl.  Thought Content: The patient denies any auditory or visual hallucinations or delusional thinking.  Perception:. wnl.  Judgment: Fair to Good.  Insight: Fair to Good.  Cognition: Oriented to person, place and time.  Loss Factors: Decline in physical health  Historical Factors: Family history of mental illness or substance abuse  Risk Reduction Factors:   Good family support.  Good assess to healthcare.  Continued Clinical Symptoms:    Schizophrenia:   Paranoid or undifferentiated type Previous Psychiatric Diagnoses and Treatments Medical Diagnoses and Treatments/Surgeries  Discharge Diagnoses:   AXIS I:   Schizophrenia - Paranoid Type.   Narcotic Dependence - In Remission. AXIS II:   Deferred. AXIS III:   1. Chronic Pain. AXIS IV:   Chronic Mental Illness. Non-compliance with medications. AXIS V:   GAF at time of admission approximately 30.  GAF at time of discharge approximately 55.  Cognitive Features That Contribute To Risk:  None Noted.  Vital Signs:Blood pressure 99/68, pulse 75, temperature 96.9 F (36.1 C), temperature source Oral, resp. rate 20, height 6\' 1"  (1.854 m), weight 79.833 kg (176 lb).  Current Medications: Current Facility-Administered Medications  Medication Dose Route Frequency Provider Last Rate Last Dose  . alum & mag hydroxide-simeth (MAALOX/MYLANTA) 200-200-20 MG/5ML suspension 30 mL  30 mL Oral Q4H PRN Cleotis Nipper, MD      . aspirin-acetaminophen-caffeine (EXCEDRIN MIGRAINE) per tablet 2 tablet  2 tablet Oral QID PRN Cleotis Nipper, MD   2 tablet at 08/08/11 1324  . clonazePAM (KLONOPIN) tablet 1 mg  1 mg Oral [id number] Curlene Labrum Briana Newman, MD   1 mg at 08/08/11 1324  . cyclobenzaprine (FLEXERIL) tablet 10 mg  10 mg Oral BH-[id number] Curlene Labrum Giabella Duhart, MD   10 mg at 08/08/11 1324  . FLUoxetine (PROZAC) capsule 40 mg  40 mg Oral Daily Curlene Labrum Shianna Bally, MD   40 mg at 08/08/11 0802  . magnesium hydroxide (MILK OF MAGNESIA) suspension 30 mL  30 mL Oral Daily PRN Cleotis Nipper, MD      .  potassium chloride SA (K-DUR,KLOR-CON) CR tablet 20 mEq  20 mEq Oral Daily Curlene Labrum Fard Borunda, MD   20 mEq at 08/08/11 0919  . ziprasidone (GEODON) capsule 80 mg  80 mg Oral Q supper Ronny Bacon, MD   80 mg at 08/07/11 2013  . DISCONTD: clonazePAM (KLONOPIN) tablet 1 mg  1 mg Oral TID Ronny Bacon, MD   1 mg at 08/07/11 1719  . DISCONTD: cyclobenzaprine (FLEXERIL) tablet 10 mg  10 mg Oral TID Ronny Bacon, MD   10 mg at 08/07/11 1719  . DISCONTD: cyclobenzaprine (FLEXERIL) tablet 10 mg  10 mg Oral Once Ronny Bacon, MD      . DISCONTD: cyclobenzaprine (FLEXERIL) tablet 10 mg  10 mg Oral Once Ronny Bacon, MD       Lab Results: No results found for this or any previous visit (from the past 48 hour(s)).  Review of Symptoms:  Neurological: No current headaches or dizziness.  G.I.: The patient denies any current nausea, vomiting or diarrhea.  Musculoskeletal: The patient reports chronic low back pain today.   Time was spent today discussing with the patient her current symptoms. The patient states that she is sleeping well without difficulty and reports a good appetite. She denies any significant feelings of sadness, anhedonia or depressed mood and denies any SI/HI. She also denies any auditory or visual hallucinations or delusional thinking. The patient reports some mild anxiety but overall feels ready for discharge to outpatient follow up.  Ms. Nicholls states that she became non-compliant with her medications and began to experience both auditory and visual hallucinations.  She states that once she restarted her medications, her symptoms quickly resolved.  She is followed by Dr. Gypsy Decant with an appointment scheduled for Aug 19, 2011.  Treatment Plan Summary:  1. Daily contact with patient to assess and evaluate symptoms and progress in treatment  2. Medication management  3. The patient will deny suicidal ideations or homicidal ideations for 48 hours prior to discharge and have a depression and anxiety rating of 3 or less. The patient will also deny any auditory or visual hallucinations or delusional thinking.  4. The patient will deny any symptoms of substance withdrawal at time of discharge.   Plan:  1. Will continue the patient on her current medications.  2. Laboratory studies reviewed.  3. Will continue to monitor.  4. Will discharge today to outpatient follow up as  requested.   Suicide Risk:  Minimal: No identifiable suicidal ideation. Patients presenting with no risk factors but with morbid ruminations; may be classified as minimal risk based on the severity of the depressive symptoms   Plan Of Care/Follow-up recommendations:  Activity: As tolerated.  Diet: Heart Healthy diet.  Other: Please take all medications as prescribed and keep all scheduled follow up appointments.  Shreyan Hinz 08/08/2011, 2:17 PM

## 2011-08-08 NOTE — Progress Notes (Signed)
[facility] Case Management Discharge Plan:  Will you be returning to the same living situation after discharge: Yes,  lives with husband and 3 children aged 51, 15, and 31. At discharge, do you have transportation home?:Yes,  daughter who is a nurse will pick her up, transport home. Do you have the ability to pay for your medications:Yes,  has disability income and 2 kinds of insurance  Interagency Information:     Release of information consent forms completed and in the chart;  Patient's signature needed at discharge.  Patient to Follow up at:  Follow-up Information    Follow up with Dr. Junious Dresser on 08/19/2011. (8:15am appointment)    Contact information:   [facility] [address]  [place]  [address] Telephone:  [phone]  Fax:  [phone]         Patient denies SI/HI:   Yes,      Safety Planning and Suicide Prevention discussed:  Yes,  over the weekend, received SPI   Barrier to discharge identified:No.  Summary and Recommendations:  Patient will see her primary care physician who has been prescribing all her meds, including psychiatric, on 5/17.  If he feels she should go to a psychiatrist or other specialist, he will do that referral, per patient.   Sarina Ser 08/08/2011, 1:11 PM

## 2011-08-08 NOTE — Progress Notes (Signed)
Patient ID: Christine Ortega, female   DOB: October 16, 1960, 51 y.o.   MRN: [id number] Pt discharged to daughter at this time, pt denies SI/HI, pt provided with prescriptions and supply of medications, discharge instructions provided and pt verbalized understanding, all belongings returned

## 2011-08-09 NOTE — Progress Notes (Signed)
[facility] Group Notes:  (Counselor/Nursing/MHT/Case Management/Adjunct)  08/09/2011 9:10 AM  Type of Therapy:  Group Therapy 08/08/11  Participation Level:  Active  Participation Quality:  Attentive and Sharing  Affect:  Appropriate  Cognitive:  Oriented  Insight:  Good  Engagement in Group:  Good  Engagement in Therapy:  Good  Modes of Intervention:  Clarification, Education, Problem-solving and Support  Summary of Progress/Problems: Patient stated that she plans never to go off her medications again. Talked about her reckless behavior. Talked about the support of her family.    Jayin Derousse, Aram Beecham 08/09/2011, 9:10 AM

## 2011-08-09 NOTE — Progress Notes (Signed)
Patient ID: Christine Ortega, female   DOB: 12-06-1960, 51 y.o.   MRN: [id number] Called patient's daughter April [phone]) on 08/08/11 to ask if she thought patient was back to baseline. She had visited her all weekend and felt like she was back to baseline and could be discharged. She expressed no concerns.

## 2011-08-11 NOTE — Progress Notes (Signed)
Patient Discharge Instructions:  After Visit Summary (AVS):   Faxed to:  08/11/2011 Face Sheet:   Faxed to:  08/11/2011 Psychiatric Admission Assessment Note:   Faxed to:  08/11/2011 Suicide Risk Assessment - Discharge Assessment:   Faxed to:  08/11/2011 Faxed/Sent to the Next Level Care provider:  08/11/2011  Faxed to [facility] - Dr. Orvan Falconer @ [phone]  Wandra Scot, 08/11/2011, 6:52 PM

## 2011-08-25 ENCOUNTER — Encounter (HOSPITAL_COMMUNITY): Payer: Self-pay

## 2011-08-25 ENCOUNTER — Emergency Department (HOSPITAL_COMMUNITY)
Admission: EM | Admit: 2011-08-25 | Discharge: 2011-08-25 | Disposition: A | Discharge status: Other Acute Inpatient Hospital | Payer: Medicare Other | Attending: Emergency Medicine | Admitting: Emergency Medicine

## 2011-08-25 DIAGNOSIS — Z046 Encounter for general psychiatric examination, requested by authority: Secondary | ICD-10-CM | POA: Insufficient documentation

## 2011-08-25 DIAGNOSIS — F319 Bipolar disorder, unspecified: Secondary | ICD-10-CM

## 2011-08-25 DIAGNOSIS — R443 Hallucinations, unspecified: Secondary | ICD-10-CM | POA: Insufficient documentation

## 2011-08-25 DIAGNOSIS — G8929 Other chronic pain: Secondary | ICD-10-CM | POA: Insufficient documentation

## 2011-08-25 DIAGNOSIS — F988 Other specified behavioral and emotional disorders with onset usually occurring in childhood and adolescence: Secondary | ICD-10-CM | POA: Insufficient documentation

## 2011-08-25 LAB — DIFFERENTIAL
Basophils Absolute: 0.1 10*3/uL (ref 0.0–0.1)
Lymphocytes Relative: 27 % (ref 12–46)
Lymphs Abs: 3.8 10*3/uL (ref 0.7–4.0)
Neutro Abs: 8.9 10*3/uL — ABNORMAL HIGH (ref 1.7–7.7)
Neutrophils Relative %: 65 % (ref 43–77)

## 2011-08-25 LAB — RAPID URINE DRUG SCREEN, HOSP PERFORMED
Amphetamines: NOT DETECTED
Benzodiazepines: NOT DETECTED
Cocaine: NOT DETECTED
Opiates: NOT DETECTED

## 2011-08-25 LAB — ETHANOL: Alcohol, Ethyl (B): <11 mg/dL (ref 0–11)

## 2011-08-25 LAB — BASIC METABOLIC PANEL
CO2: 27 mEq/L (ref 19–32)
Calcium: 9.3 mg/dL (ref 8.4–10.5)
Chloride: 101 mEq/L (ref 96–112)
Potassium: 3.9 mEq/L (ref 3.5–5.1)
Sodium: 136 mEq/L (ref 135–145)

## 2011-08-25 LAB — CBC
Platelets: 335 10*3/uL (ref 150–400)
RBC: 3.91 MIL/uL (ref 3.87–5.11)
RDW: 13.2 % (ref 11.5–15.5)
WBC: 13.7 10*3/uL — ABNORMAL HIGH (ref 4.0–10.5)

## 2011-08-25 MED ORDER — FLUOXETINE HCL 20 MG PO CAPS
40.0000 mg | ORAL_CAPSULE | Freq: Every day | ORAL | Status: DC
  Administered 2011-08-25: 40 mg via ORAL
  Filled 2011-08-25 (×3): qty 2

## 2011-08-25 MED ORDER — ZIPRASIDONE MESYLATE 20 MG IM SOLR
20.0000 mg | Freq: Once | INTRAMUSCULAR | Status: AC
  Administered 2011-08-25: 20 mg via INTRAMUSCULAR
  Filled 2011-08-25: qty 20

## 2011-08-25 MED ORDER — ONDANSETRON HCL 4 MG PO TABS: 4.0000 mg | ORAL_TABLET | Freq: Three times a day (TID) | ORAL | Status: DC | PRN

## 2011-08-25 MED ORDER — ZOLPIDEM TARTRATE 5 MG PO TABS: 5.0000 mg | ORAL_TABLET | Freq: Every evening | ORAL | Status: DC | PRN

## 2011-08-25 MED ORDER — ZIPRASIDONE HCL 80 MG PO CAPS
80.0000 mg | ORAL_CAPSULE | Freq: Every day | ORAL | Status: DC
  Filled 2011-08-25 (×2): qty 1

## 2011-08-25 MED ORDER — NICOTINE 21 MG/24HR TD PT24
21.0000 mg | MEDICATED_PATCH | Freq: Every day | TRANSDERMAL | Status: DC
  Filled 2011-08-25: qty 1

## 2011-08-25 MED ORDER — ACETAMINOPHEN 325 MG PO TABS: 650.0000 mg | ORAL_TABLET | ORAL | Status: DC | PRN

## 2011-08-25 MED ORDER — LORAZEPAM 1 MG PO TABS: 1.0000 mg | ORAL_TABLET | Freq: Three times a day (TID) | ORAL | Status: DC | PRN

## 2011-08-25 MED ORDER — ZIPRASIDONE HCL 80 MG PO CAPS
80.0000 mg | ORAL_CAPSULE | Freq: Once | ORAL | Status: DC
Start: 2011-08-25 — End: 2011-08-25
  Filled 2011-08-25: qty 1

## 2011-08-25 MED ORDER — CLONAZEPAM 0.5 MG PO TABS
0.5000 mg | ORAL_TABLET | Freq: Three times a day (TID) | ORAL | Status: DC
  Administered 2011-08-25: 0.5 mg via ORAL
  Filled 2011-08-25 (×2): qty 1

## 2011-08-25 NOTE — ED Notes (Addendum)
Witnessed Pt in room having a conversation and hollaring intermittant as if someone in room with her.

## 2011-08-25 NOTE — ED Provider Notes (Signed)
History     CSN: [id number]  Arrival date & time 08/25/11  0545   First MD Initiated Contact with Patient 08/25/11 [phone]      Chief Complaint  Patient presents with  . Psychiatric Evaluation    (Consider location/radiation/quality/duration/timing/severity/associated sxs/prior treatment) HPI Christine Ortega is a 51 y.o. female  With a h/o bipolar disease,who was brought to theto the Emergency Department by [facility] Department complaining of hallucinations. Patient was recently hospitalized at [facility] (5/3 -08/09/2011) having gone off her geodon. She is here today having called 911 claiming a policeman was shot in her home and there are undercover agents in her attic. She can hear them through the ceiling.She is seen by Dr. Verlee Rossetti in [facility] who manages her  Chronic pain and former narcotic addiction with suboxone. She is seeking help because her family thinks she is crazy and she knows she is hearing voices.   Past Medical History  Diagnosis Date  . Narcotic abuse in remission   . Bipolar disorder   . Chronic pain   . ADD (attention deficit disorder)     Past Surgical History  Procedure Date  . Back surgery     History reviewed. No pertinent family history.  History  Substance Use Topics  . Smoking status: Current Everyday Smoker -- 0.5 packs/day  . Smokeless tobacco: Not on file  . Alcohol Use: No    OB History    Grav Para Term Preterm Abortions TAB SAB Ect Mult Living                  Review of Systems  Constitutional: Negative for fever.       10 Systems reviewed and are negative for acute change except as noted in the HPI.  HENT: Negative for congestion.   Eyes: Negative for discharge and redness.  Respiratory: Negative for cough and shortness of breath.   Cardiovascular: Negative for chest pain.  Gastrointestinal: Negative for vomiting and abdominal pain.  Musculoskeletal: Negative for back pain.  Skin: Negative for rash.    Neurological: Negative for syncope, numbness and headaches.  Psychiatric/Behavioral:       Hearing voices    Allergies  Ibuprofen and Nsaids  Home Medications   Current Outpatient Rx  Name Route Sig Dispense Refill  . CLONAZEPAM 1 MG PO TABS  Take at 8AM, 2pm, and at bedtime for anxiety. 90 tablet 0  . CYCLOBENZAPRINE HCL 10 MG PO TABS  Take one tablet every eight hours if needed for muscle spasm. 30 tablet 0  . FLUOXETINE HCL 40 MG PO CAPS Oral Take 1 capsule (40 mg total) by mouth daily. For depression and anxiety. 30 capsule 0  . ZIPRASIDONE HCL 80 MG PO CAPS  Take one capsule at bedtime for depression and mood stability. 30 capsule 0    BP 141/90  Pulse 112  Temp(Src) 98.6 F (37 C) (Oral)  Resp 16  Ht 5\' 2"  (1.575 m)  Wt 168 lb (76.204 kg)  BMI 30.73 kg/m2  SpO2 95%  Physical Exam  Nursing note and vitals reviewed. Constitutional:       Awake, alert, nontoxic appearance.Tearful  HENT:  Head: Atraumatic.  Eyes: Right eye exhibits no discharge. Left eye exhibits no discharge.  Neck: Neck supple.  Pulmonary/Chest: Effort normal. She exhibits no tenderness.  Abdominal: Soft. There is no tenderness. There is no rebound.  Musculoskeletal: She exhibits no tenderness.       Baseline ROM, no obvious new  focal weakness.  Neurological:       Mental status and motor strength appears baseline for patient and situation.  Skin: No rash noted.  Psychiatric: She has a normal mood and affect.       Denies SI/HI. States she does hear voices and she has seen and talked with the undercover agents that live in her attic.     ED Course  Procedures (including critical care time)   Labs Reviewed  CBC  DIFFERENTIAL  BASIC METABOLIC PANEL  ETHANOL  URINE RAPID DRUG SCREEN (HOSP PERFORMED)   [phone] Reviewed previous admission to [facility]. She was taken off suboxone while hospitalized for 3 days. Patient states when she went home she restarted her subaxone. She was discharged with the  understanding she would take her geodon. Patient states she has been compliant. Daughter places meds in a pill box.    MDM  Patient with h/o hallucinations and delusions here seeking help. Recent hospitalization for similar presentation. She is voluntary at the present time. Once medially cleared, she is to be evaluated by [facility]. 0700 Care/disposition to Dr. Adriana Simas.  MDM Reviewed: previous chart, nursing note and vitals           Nicoletta Dress. Colon Branch, MD 08/25/11 [phone]

## 2011-08-25 NOTE — BH Assessment (Signed)
Assessment Note   Patient has been accepted by Dr. Wendall Stade @ [facility].  [place], [facility] M 08/25/2011 2:13 PM

## 2011-08-25 NOTE — ED Notes (Signed)
Report to christy rn.

## 2011-08-25 NOTE — ED Notes (Addendum)
Family at bedside.Pt's daughter. Pt crying wanting to leave. Daughter consoling pt. Pt yelling out and crying out. Daughter requesting IM injection of geodon for pt. MD notified.

## 2011-08-25 NOTE — ED Notes (Signed)
Gave patient AM food tray. No other needs voiced at this time.

## 2011-08-25 NOTE — ED Notes (Signed)
Pts daughter April Majka took pts black pocketbook with her, as requested by he mom.  Daryll Drown, Pts nurse also aware.
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
## 2011-08-25 NOTE — ED Notes (Addendum)
Patient wanded by security. Pocketbook removed from room, patient placed in hospital gown at her request. States that she does not ever wear pants and does not want to wear paper scrubs. Patients dress, shoes, and pocketbook placed in cabinet. Patient states that she does not have any money, Chapman Moss, RN present when patient stated this information.

## 2011-08-25 NOTE — ED Notes (Signed)
IVC papers faxed to court house by [facility] member and pt can go with them when they come. Security on standby

## 2011-08-25 NOTE — ED Notes (Signed)
Pt refused to eat am meal, stated she "just want to go back home", "i just need to take my geodon"
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
## 2011-08-25 NOTE — ED Notes (Signed)
Pt left with sheriff ; pt was yelling but cooperative with sheriff

## 2016-10-28 ENCOUNTER — Other Ambulatory Visit (HOSPITAL_COMMUNITY): Payer: Self-pay | Admitting: Family Medicine

## 2016-10-28 DIAGNOSIS — G8929 Other chronic pain: Secondary | ICD-10-CM

## 2016-10-28 DIAGNOSIS — M545 Low back pain: Principal | ICD-10-CM

## 2016-11-01 ENCOUNTER — Ambulatory Visit (HOSPITAL_COMMUNITY)
Admission: RE | Admit: 2016-11-01 | Discharge: 2016-11-01 | Disposition: A | Discharge status: Home / Self Care | Payer: Medicare Other | Source: Ambulatory Visit | Attending: Family Medicine | Admitting: Family Medicine

## 2016-11-01 DIAGNOSIS — M4316 Spondylolisthesis, lumbar region: Secondary | ICD-10-CM | POA: Insufficient documentation

## 2016-11-01 DIAGNOSIS — G8929 Other chronic pain: Secondary | ICD-10-CM | POA: Insufficient documentation

## 2016-11-01 DIAGNOSIS — M545 Low back pain: Secondary | ICD-10-CM | POA: Diagnosis present

## 2016-11-01 DIAGNOSIS — M5136 Other intervertebral disc degeneration, lumbar region: Secondary | ICD-10-CM | POA: Insufficient documentation

## 2016-11-01 DIAGNOSIS — M47896 Other spondylosis, lumbar region: Secondary | ICD-10-CM | POA: Diagnosis not present

## 2016-11-01 DIAGNOSIS — M47897 Other spondylosis, lumbosacral region: Secondary | ICD-10-CM | POA: Diagnosis not present

## 2018-05-19 ENCOUNTER — Other Ambulatory Visit: Payer: Self-pay

## 2018-05-19 ENCOUNTER — Inpatient Hospital Stay (HOSPITAL_COMMUNITY): Payer: Medicare Other

## 2018-05-19 ENCOUNTER — Emergency Department (HOSPITAL_COMMUNITY): Payer: Medicare Other

## 2018-05-19 ENCOUNTER — Encounter (HOSPITAL_COMMUNITY): Payer: Self-pay | Admitting: Emergency Medicine

## 2018-05-19 ENCOUNTER — Encounter (HOSPITAL_COMMUNITY)
Admission: EM | Disposition: A | Discharge status: Home Health | Payer: Self-pay | Source: Home / Self Care | Attending: Pulmonary Disease

## 2018-05-19 ENCOUNTER — Inpatient Hospital Stay (HOSPITAL_COMMUNITY)
Admission: EM | Admit: 2018-05-19 | Discharge: 2018-06-08 | DRG: 853 | Disposition: A | Discharge status: Home Health | Payer: Medicare Other | Attending: Internal Medicine | Admitting: Internal Medicine

## 2018-05-19 DIAGNOSIS — F909 Attention-deficit hyperactivity disorder, unspecified type: Secondary | ICD-10-CM | POA: Diagnosis not present

## 2018-05-19 DIAGNOSIS — T82858A Stenosis of vascular prosthetic devices, implants and grafts, initial encounter: Secondary | ICD-10-CM | POA: Diagnosis not present

## 2018-05-19 DIAGNOSIS — G92 Toxic encephalopathy: Secondary | ICD-10-CM | POA: Diagnosis not present

## 2018-05-19 DIAGNOSIS — I82622 Acute embolism and thrombosis of deep veins of left upper extremity: Secondary | ICD-10-CM | POA: Diagnosis not present

## 2018-05-19 DIAGNOSIS — T82898A Other specified complication of vascular prosthetic devices, implants and grafts, initial encounter: Secondary | ICD-10-CM | POA: Diagnosis not present

## 2018-05-19 DIAGNOSIS — J189 Pneumonia, unspecified organism: Secondary | ICD-10-CM | POA: Diagnosis not present

## 2018-05-19 DIAGNOSIS — I82409 Acute embolism and thrombosis of unspecified deep veins of unspecified lower extremity: Secondary | ICD-10-CM | POA: Diagnosis not present

## 2018-05-19 DIAGNOSIS — R509 Fever, unspecified: Secondary | ICD-10-CM

## 2018-05-19 DIAGNOSIS — Z888 Allergy status to other drugs, medicaments and biological substances status: Secondary | ICD-10-CM

## 2018-05-19 DIAGNOSIS — E876 Hypokalemia: Secondary | ICD-10-CM

## 2018-05-19 DIAGNOSIS — J9600 Acute respiratory failure, unspecified whether with hypoxia or hypercapnia: Secondary | ICD-10-CM | POA: Diagnosis not present

## 2018-05-19 DIAGNOSIS — K625 Hemorrhage of anus and rectum: Secondary | ICD-10-CM | POA: Diagnosis not present

## 2018-05-19 DIAGNOSIS — E872 Acidosis: Secondary | ICD-10-CM | POA: Diagnosis present

## 2018-05-19 DIAGNOSIS — R0602 Shortness of breath: Secondary | ICD-10-CM

## 2018-05-19 DIAGNOSIS — R6521 Severe sepsis with septic shock: Secondary | ICD-10-CM | POA: Diagnosis not present

## 2018-05-19 DIAGNOSIS — A419 Sepsis, unspecified organism: Principal | ICD-10-CM

## 2018-05-19 DIAGNOSIS — K270 Acute peptic ulcer, site unspecified, with hemorrhage: Secondary | ICD-10-CM | POA: Diagnosis not present

## 2018-05-19 DIAGNOSIS — F319 Bipolar disorder, unspecified: Secondary | ICD-10-CM | POA: Diagnosis present

## 2018-05-19 DIAGNOSIS — K922 Gastrointestinal hemorrhage, unspecified: Secondary | ICD-10-CM

## 2018-05-19 DIAGNOSIS — Z452 Encounter for adjustment and management of vascular access device: Secondary | ICD-10-CM | POA: Diagnosis not present

## 2018-05-19 DIAGNOSIS — G8929 Other chronic pain: Secondary | ICD-10-CM | POA: Diagnosis not present

## 2018-05-19 DIAGNOSIS — K631 Perforation of intestine (nontraumatic): Secondary | ICD-10-CM

## 2018-05-19 DIAGNOSIS — J1100 Influenza due to unidentified influenza virus with unspecified type of pneumonia: Secondary | ICD-10-CM | POA: Diagnosis present

## 2018-05-19 DIAGNOSIS — R059 Cough, unspecified: Secondary | ICD-10-CM

## 2018-05-19 DIAGNOSIS — F988 Other specified behavioral and emotional disorders with onset usually occurring in childhood and adolescence: Secondary | ICD-10-CM | POA: Diagnosis not present

## 2018-05-19 DIAGNOSIS — K250 Acute gastric ulcer with hemorrhage: Secondary | ICD-10-CM | POA: Diagnosis not present

## 2018-05-19 DIAGNOSIS — I361 Nonrheumatic tricuspid (valve) insufficiency: Secondary | ICD-10-CM | POA: Diagnosis not present

## 2018-05-19 DIAGNOSIS — D473 Essential (hemorrhagic) thrombocythemia: Secondary | ICD-10-CM | POA: Diagnosis not present

## 2018-05-19 DIAGNOSIS — B9620 Unspecified Escherichia coli [E. coli] as the cause of diseases classified elsewhere: Secondary | ICD-10-CM | POA: Diagnosis present

## 2018-05-19 DIAGNOSIS — Z87891 Personal history of nicotine dependence: Secondary | ICD-10-CM

## 2018-05-19 DIAGNOSIS — M21371 Foot drop, right foot: Secondary | ICD-10-CM | POA: Diagnosis present

## 2018-05-19 DIAGNOSIS — D62 Acute posthemorrhagic anemia: Secondary | ICD-10-CM | POA: Diagnosis not present

## 2018-05-19 DIAGNOSIS — J9601 Acute respiratory failure with hypoxia: Secondary | ICD-10-CM | POA: Diagnosis not present

## 2018-05-19 DIAGNOSIS — D500 Iron deficiency anemia secondary to blood loss (chronic): Secondary | ICD-10-CM | POA: Diagnosis not present

## 2018-05-19 DIAGNOSIS — F200 Paranoid schizophrenia: Secondary | ICD-10-CM | POA: Diagnosis present

## 2018-05-19 DIAGNOSIS — R569 Unspecified convulsions: Secondary | ICD-10-CM | POA: Diagnosis not present

## 2018-05-19 DIAGNOSIS — R143 Flatulence: Secondary | ICD-10-CM | POA: Diagnosis not present

## 2018-05-19 DIAGNOSIS — M549 Dorsalgia, unspecified: Secondary | ICD-10-CM | POA: Diagnosis present

## 2018-05-19 DIAGNOSIS — F1120 Opioid dependence, uncomplicated: Secondary | ICD-10-CM | POA: Diagnosis present

## 2018-05-19 DIAGNOSIS — N390 Urinary tract infection, site not specified: Secondary | ICD-10-CM | POA: Diagnosis not present

## 2018-05-19 DIAGNOSIS — R262 Difficulty in walking, not elsewhere classified: Secondary | ICD-10-CM | POA: Diagnosis not present

## 2018-05-19 DIAGNOSIS — R1310 Dysphagia, unspecified: Secondary | ICD-10-CM | POA: Diagnosis not present

## 2018-05-19 DIAGNOSIS — Z978 Presence of other specified devices: Secondary | ICD-10-CM

## 2018-05-19 DIAGNOSIS — F209 Schizophrenia, unspecified: Secondary | ICD-10-CM | POA: Diagnosis present

## 2018-05-19 DIAGNOSIS — Z539 Procedure and treatment not carried out, unspecified reason: Secondary | ICD-10-CM | POA: Diagnosis present

## 2018-05-19 DIAGNOSIS — K264 Chronic or unspecified duodenal ulcer with hemorrhage: Secondary | ICD-10-CM | POA: Diagnosis not present

## 2018-05-19 DIAGNOSIS — F419 Anxiety disorder, unspecified: Secondary | ICD-10-CM | POA: Diagnosis present

## 2018-05-19 DIAGNOSIS — J9691 Respiratory failure, unspecified with hypoxia: Secondary | ICD-10-CM

## 2018-05-19 DIAGNOSIS — R739 Hyperglycemia, unspecified: Secondary | ICD-10-CM | POA: Diagnosis not present

## 2018-05-19 DIAGNOSIS — Z7982 Long term (current) use of aspirin: Secondary | ICD-10-CM

## 2018-05-19 DIAGNOSIS — M21372 Foot drop, left foot: Secondary | ICD-10-CM | POA: Diagnosis present

## 2018-05-19 DIAGNOSIS — Z79899 Other long term (current) drug therapy: Secondary | ICD-10-CM

## 2018-05-19 DIAGNOSIS — Z9911 Dependence on respirator [ventilator] status: Secondary | ICD-10-CM | POA: Diagnosis not present

## 2018-05-19 DIAGNOSIS — J9584 Transfusion-related acute lung injury (TRALI): Secondary | ICD-10-CM | POA: Diagnosis not present

## 2018-05-19 DIAGNOSIS — I248 Other forms of acute ischemic heart disease: Secondary | ICD-10-CM | POA: Diagnosis not present

## 2018-05-19 DIAGNOSIS — Z931 Gastrostomy status: Secondary | ICD-10-CM | POA: Diagnosis not present

## 2018-05-19 DIAGNOSIS — I82411 Acute embolism and thrombosis of right femoral vein: Secondary | ICD-10-CM | POA: Diagnosis not present

## 2018-05-19 DIAGNOSIS — I82401 Acute embolism and thrombosis of unspecified deep veins of right lower extremity: Secondary | ICD-10-CM | POA: Diagnosis not present

## 2018-05-19 DIAGNOSIS — G9340 Encephalopathy, unspecified: Secondary | ICD-10-CM | POA: Diagnosis not present

## 2018-05-19 DIAGNOSIS — K254 Chronic or unspecified gastric ulcer with hemorrhage: Secondary | ICD-10-CM | POA: Diagnosis present

## 2018-05-19 DIAGNOSIS — R05 Cough: Secondary | ICD-10-CM

## 2018-05-19 DIAGNOSIS — J9690 Respiratory failure, unspecified, unspecified whether with hypoxia or hypercapnia: Secondary | ICD-10-CM | POA: Diagnosis not present

## 2018-05-19 DIAGNOSIS — R000 Tachycardia, unspecified: Secondary | ICD-10-CM | POA: Diagnosis not present

## 2018-05-19 DIAGNOSIS — R945 Abnormal results of liver function studies: Secondary | ICD-10-CM | POA: Diagnosis not present

## 2018-05-19 DIAGNOSIS — T4275XA Adverse effect of unspecified antiepileptic and sedative-hypnotic drugs, initial encounter: Secondary | ICD-10-CM | POA: Diagnosis not present

## 2018-05-19 DIAGNOSIS — R0603 Acute respiratory distress: Secondary | ICD-10-CM

## 2018-05-19 DIAGNOSIS — R578 Other shock: Secondary | ICD-10-CM | POA: Diagnosis not present

## 2018-05-19 DIAGNOSIS — J159 Unspecified bacterial pneumonia: Secondary | ICD-10-CM | POA: Diagnosis not present

## 2018-05-19 DIAGNOSIS — M7989 Other specified soft tissue disorders: Secondary | ICD-10-CM | POA: Diagnosis not present

## 2018-05-19 DIAGNOSIS — D75839 Thrombocytosis, unspecified: Secondary | ICD-10-CM

## 2018-05-19 DIAGNOSIS — Z0189 Encounter for other specified special examinations: Secondary | ICD-10-CM

## 2018-05-19 DIAGNOSIS — R0689 Other abnormalities of breathing: Secondary | ICD-10-CM | POA: Diagnosis not present

## 2018-05-19 DIAGNOSIS — I952 Hypotension due to drugs: Secondary | ICD-10-CM | POA: Diagnosis not present

## 2018-05-19 DIAGNOSIS — J0410 Acute tracheitis without obstruction: Secondary | ICD-10-CM | POA: Diagnosis not present

## 2018-05-19 DIAGNOSIS — R918 Other nonspecific abnormal finding of lung field: Secondary | ICD-10-CM | POA: Diagnosis not present

## 2018-05-19 DIAGNOSIS — Z4659 Encounter for fitting and adjustment of other gastrointestinal appliance and device: Secondary | ICD-10-CM

## 2018-05-19 DIAGNOSIS — R579 Shock, unspecified: Secondary | ICD-10-CM | POA: Diagnosis not present

## 2018-05-19 DIAGNOSIS — G894 Chronic pain syndrome: Secondary | ICD-10-CM

## 2018-05-19 DIAGNOSIS — F1911 Other psychoactive substance abuse, in remission: Secondary | ICD-10-CM | POA: Diagnosis not present

## 2018-05-19 HISTORY — DX: Depression, unspecified: F32.A

## 2018-05-19 HISTORY — DX: Major depressive disorder, single episode, unspecified: F32.9

## 2018-05-19 HISTORY — DX: Schizophrenia, unspecified: F20.9

## 2018-05-19 HISTORY — DX: Anxiety disorder, unspecified: F41.9

## 2018-05-19 HISTORY — DX: Unspecified convulsions: R56.9

## 2018-05-19 HISTORY — PX: ESOPHAGOGASTRODUODENOSCOPY: SHX5428

## 2018-05-19 LAB — BASIC METABOLIC PANEL
Anion gap: 18 — ABNORMAL HIGH (ref 5–15)
BUN: 16 mg/dL (ref 6–20)
CALCIUM: 10.3 mg/dL (ref 8.9–10.3)
CO2: 22 mmol/L (ref 22–32)
CREATININE: 1.04 mg/dL — AB (ref 0.44–1.00)
Chloride: 97 mmol/L — ABNORMAL LOW (ref 98–111)
GFR calc Af Amer: >60 mL/min (ref >60)
GFR, EST NON AFRICAN AMERICAN: 60 mL/min — AB (ref >60)
GLUCOSE: 135 mg/dL — AB (ref 70–99)
Potassium: 2.6 mmol/L — CL (ref 3.5–5.1)
Sodium: 137 mmol/L (ref 135–145)

## 2018-05-19 LAB — CBC WITH DIFFERENTIAL/PLATELET
ABS IMMATURE GRANULOCYTES: 0.08 10*3/uL — AB (ref 0.00–0.07)
Abs Immature Granulocytes: 0.22 10*3/uL — ABNORMAL HIGH (ref 0.00–0.07)
BASOS PCT: 0 %
Basophils Absolute: 0.1 10*3/uL (ref 0.0–0.1)
Basophils Absolute: 0 10*3/uL (ref 0.0–0.1)
Basophils Relative: 0 %
EOS ABS: 0.2 10*3/uL (ref 0.0–0.5)
EOS PCT: 1 %
Eosinophils Absolute: 0.2 10*3/uL (ref 0.0–0.5)
Eosinophils Relative: 1 %
HCT: 16.2 % — ABNORMAL LOW (ref 36.0–46.0)
HEMATOCRIT: 20.3 % — AB (ref 36.0–46.0)
Hemoglobin: 6 g/dL — CL (ref 12.0–15.0)
Hemoglobin: 4.8 g/dL — CL (ref 12.0–15.0)
Immature Granulocytes: 1 %
Immature Granulocytes: 1 %
Lymphocytes Relative: 20 %
Lymphocytes Relative: 20 %
Lymphs Abs: 4.3 10*3/uL — ABNORMAL HIGH (ref 0.7–4.0)
Lymphs Abs: 3 10*3/uL (ref 0.7–4.0)
MCH: 32.3 pg (ref 26.0–34.0)
MCH: 29.8 pg (ref 26.0–34.0)
MCHC: 29.6 g/dL — ABNORMAL LOW (ref 30.0–36.0)
MCHC: 29.6 g/dL — ABNORMAL LOW (ref 30.0–36.0)
MCV: 109.1 fL — AB (ref 80.0–100.0)
MCV: 100.6 fL — ABNORMAL HIGH (ref 80.0–100.0)
MONO ABS: 1.2 10*3/uL — AB (ref 0.1–1.0)
Monocytes Absolute: 1.1 10*3/uL — ABNORMAL HIGH (ref 0.1–1.0)
Monocytes Relative: 5 %
Monocytes Relative: 8 %
NEUTROS ABS: 10.8 10*3/uL — AB (ref 1.7–7.7)
NEUTROS PCT: 73 %
NEUTROS PCT: 70 %
Neutro Abs: 15.6 10*3/uL — ABNORMAL HIGH (ref 1.7–7.7)
PLATELETS: 811 10*3/uL — AB (ref 150–400)
PLATELETS: 479 10*3/uL — AB (ref 150–400)
RBC: 1.86 MIL/uL — AB (ref 3.87–5.11)
RBC: 1.61 MIL/uL — ABNORMAL LOW (ref 3.87–5.11)
RDW: 20.4 % — AB (ref 11.5–15.5)
RDW: 20.7 % — ABNORMAL HIGH (ref 11.5–15.5)
WBC: 21.4 10*3/uL — ABNORMAL HIGH (ref 4.0–10.5)
WBC: 15.3 10*3/uL — ABNORMAL HIGH (ref 4.0–10.5)
nRBC: 0.8 % — ABNORMAL HIGH (ref 0.0–0.2)
nRBC: 1 % — ABNORMAL HIGH (ref 0.0–0.2)

## 2018-05-19 LAB — COMPREHENSIVE METABOLIC PANEL
ALT: 12 U/L (ref 0–44)
ANION GAP: 7 (ref 5–15)
AST: 33 U/L (ref 15–41)
Albumin: 1.9 g/dL — ABNORMAL LOW (ref 3.5–5.0)
Alkaline Phosphatase: 79 U/L (ref 38–126)
BUN: 14 mg/dL (ref 6–20)
CO2: 21 mmol/L — ABNORMAL LOW (ref 22–32)
Calcium: 7.5 mg/dL — ABNORMAL LOW (ref 8.9–10.3)
Chloride: 113 mmol/L — ABNORMAL HIGH (ref 98–111)
Creatinine, Ser: 0.8 mg/dL (ref 0.44–1.00)
GFR calc Af Amer: >60 mL/min (ref >60)
GFR calc non Af Amer: >60 mL/min (ref >60)
Glucose, Bld: 86 mg/dL (ref 70–99)
Potassium: 4.3 mmol/L (ref 3.5–5.1)
Sodium: 141 mmol/L (ref 135–145)
Total Bilirubin: 0.2 mg/dL — ABNORMAL LOW (ref 0.3–1.2)
Total Protein: 5 g/dL — ABNORMAL LOW (ref 6.5–8.1)

## 2018-05-19 LAB — LACTIC ACID, PLASMA
LACTIC ACID, VENOUS: 5.6 mmol/L — AB (ref 0.5–1.9)
LACTIC ACID, VENOUS: 4.4 mmol/L — AB (ref 0.5–1.9)
LACTIC ACID, VENOUS: 1.6 mmol/L (ref 0.5–1.9)

## 2018-05-19 LAB — URINALYSIS, ROUTINE W REFLEX MICROSCOPIC
Bilirubin Urine: NEGATIVE
Glucose, UA: NEGATIVE mg/dL
KETONES UR: NEGATIVE mg/dL
Nitrite: NEGATIVE
PH: 6 (ref 5.0–8.0)
Protein, ur: 30 mg/dL — AB
Specific Gravity, Urine: 1.008 (ref 1.005–1.030)
Trans Epithel, UA: 1
WBC, UA: >50 WBC/hpf — ABNORMAL HIGH (ref 0–5)

## 2018-05-19 LAB — CBC
HCT: 13.6 % — ABNORMAL LOW (ref 36.0–46.0)
HEMOGLOBIN: 4.1 g/dL — AB (ref 12.0–15.0)
MCH: 29.9 pg (ref 26.0–34.0)
MCHC: 30.1 g/dL (ref 30.0–36.0)
MCV: 99.3 fL (ref 80.0–100.0)
Platelets: 436 10*3/uL — ABNORMAL HIGH (ref 150–400)
RBC: 1.37 MIL/uL — AB (ref 3.87–5.11)
RDW: 20.7 % — ABNORMAL HIGH (ref 11.5–15.5)
WBC: 14.5 10*3/uL — ABNORMAL HIGH (ref 4.0–10.5)
nRBC: 0.9 % — ABNORMAL HIGH (ref 0.0–0.2)

## 2018-05-19 LAB — VALPROIC ACID LEVEL: Valproic Acid Lvl: 11 ug/mL — ABNORMAL LOW (ref 50.0–100.0)

## 2018-05-19 LAB — POC OCCULT BLOOD, ED: FECAL OCCULT BLD: POSITIVE — AB

## 2018-05-19 LAB — MAGNESIUM
Magnesium: 1.9 mg/dL (ref 1.7–2.4)
Magnesium: 1.5 mg/dL — ABNORMAL LOW (ref 1.7–2.4)

## 2018-05-19 LAB — ABO/RH
ABO/RH(D): O POS
ABO/RH(D): O POS

## 2018-05-19 LAB — PREPARE RBC (CROSSMATCH)

## 2018-05-19 LAB — MRSA PCR SCREENING: MRSA BY PCR: NEGATIVE

## 2018-05-19 LAB — INFLUENZA PANEL BY PCR (TYPE A & B)
Influenza A By PCR: NEGATIVE
Influenza B By PCR: NEGATIVE

## 2018-05-19 LAB — PROTIME-INR
INR: 1.23
Prothrombin Time: 15.4 seconds — ABNORMAL HIGH (ref 11.4–15.2)

## 2018-05-19 LAB — APTT: aPTT: 31 seconds (ref 24–36)

## 2018-05-19 LAB — PROCALCITONIN: Procalcitonin: 0.68 ng/mL

## 2018-05-19 LAB — AMMONIA: Ammonia: 24 umol/L (ref 9–35)

## 2018-05-19 SURGERY — EGD (ESOPHAGOGASTRODUODENOSCOPY)
Anesthesia: Moderate Sedation

## 2018-05-19 MED ORDER — SODIUM CHLORIDE 0.9% IV SOLUTION: Freq: Once | INTRAVENOUS | Status: DC

## 2018-05-19 MED ORDER — VANCOMYCIN HCL 10 G IV SOLR
1250.0000 mg | Freq: Once | INTRAVENOUS | Status: AC
  Administered 2018-05-19: 1250 mg via INTRAVENOUS
  Filled 2018-05-19: qty 1250

## 2018-05-19 MED ORDER — POTASSIUM CHLORIDE IN NACL 20-0.9 MEQ/L-% IV SOLN
INTRAVENOUS | Status: DC
  Administered 2018-05-19: 15:00:00 via INTRAVENOUS
  Filled 2018-05-19 (×2): qty 1000

## 2018-05-19 MED ORDER — NOREPINEPHRINE 4 MG/250ML-% IV SOLN
0.0000 ug/min | INTRAVENOUS | Status: DC
  Administered 2018-05-19: 5 ug/min via INTRAVENOUS
  Filled 2018-05-19 (×3): qty 250

## 2018-05-19 MED ORDER — ONDANSETRON HCL 4 MG PO TABS: 4.0000 mg | ORAL_TABLET | Freq: Four times a day (QID) | ORAL | Status: DC | PRN

## 2018-05-19 MED ORDER — VANCOMYCIN HCL IN DEXTROSE 1-5 GM/200ML-% IV SOLN
1000.0000 mg | INTRAVENOUS | Status: DC
  Filled 2018-05-19: qty 200

## 2018-05-19 MED ORDER — POTASSIUM CHLORIDE 20 MEQ/15ML (10%) PO SOLN
20.0000 meq | Freq: Once | ORAL | Status: AC
  Administered 2018-05-19: 20 meq via ORAL
  Filled 2018-05-19: qty 15
  Filled 2018-05-19: qty 30
  Filled 2018-05-19: qty 15

## 2018-05-19 MED ORDER — SODIUM CHLORIDE 0.9 % IV BOLUS
1000.0000 mL | Freq: Once | INTRAVENOUS | Status: AC
  Administered 2018-05-19: 1000 mL via INTRAVENOUS

## 2018-05-19 MED ORDER — ACETAMINOPHEN 325 MG PO TABS
650.0000 mg | ORAL_TABLET | Freq: Four times a day (QID) | ORAL | Status: DC | PRN
  Administered 2018-05-19: 650 mg via ORAL
  Filled 2018-05-19: qty 2

## 2018-05-19 MED ORDER — SODIUM CHLORIDE 0.9 % IV SOLN
2.0000 g | INTRAVENOUS | Status: AC
  Administered 2018-05-20 – 2018-05-26 (×7): 2 g via INTRAVENOUS
  Filled 2018-05-19 (×7): qty 20

## 2018-05-19 MED ORDER — SODIUM CHLORIDE 0.9 % IV SOLN
80.0000 mg | Freq: Once | INTRAVENOUS | Status: AC
  Administered 2018-05-19: 80 mg via INTRAVENOUS
  Filled 2018-05-19: qty 80

## 2018-05-19 MED ORDER — FENTANYL CITRATE (PF) 100 MCG/2ML IJ SOLN
50.0000 ug | Freq: Once | INTRAMUSCULAR | Status: AC
  Administered 2018-05-19: 50 ug via INTRAVENOUS
  Filled 2018-05-19: qty 2

## 2018-05-19 MED ORDER — SODIUM CHLORIDE 0.9 % IV SOLN
8.0000 mg/h | INTRAVENOUS | Status: AC
  Administered 2018-05-19 – 2018-05-22 (×5): 8 mg/h via INTRAVENOUS
  Filled 2018-05-19 (×12): qty 80

## 2018-05-19 MED ORDER — HYDROCODONE-HOMATROPINE 5-1.5 MG/5ML PO SYRP
5.0000 mL | ORAL_SOLUTION | ORAL | Status: DC | PRN
  Administered 2018-05-22: 5 mL via ORAL
  Filled 2018-05-19: qty 5

## 2018-05-19 MED ORDER — DM-GUAIFENESIN ER 30-600 MG PO TB12
1.0000 | ORAL_TABLET | Freq: Two times a day (BID) | ORAL | Status: DC
  Filled 2018-05-19 (×2): qty 1

## 2018-05-19 MED ORDER — PANTOPRAZOLE SODIUM 40 MG IV SOLR: 40.0000 mg | Freq: Two times a day (BID) | INTRAVENOUS | Status: DC

## 2018-05-19 MED ORDER — HYDROCODONE-ACETAMINOPHEN 5-325 MG PO TABS: 1.0000 | ORAL_TABLET | Freq: Four times a day (QID) | ORAL | Status: DC | PRN

## 2018-05-19 MED ORDER — GUAIFENESIN-DM 100-10 MG/5ML PO SYRP
5.0000 mL | ORAL_SOLUTION | Freq: Once | ORAL | Status: AC
  Administered 2018-05-19: 5 mL via ORAL
  Filled 2018-05-19: qty 5

## 2018-05-19 MED ORDER — SODIUM CHLORIDE 0.9 % IV SOLN
1.0000 g | Freq: Once | INTRAVENOUS | Status: AC
  Administered 2018-05-19: 1 g via INTRAVENOUS
  Filled 2018-05-19: qty 10

## 2018-05-19 MED ORDER — FLUOXETINE HCL 20 MG PO CAPS
40.0000 mg | ORAL_CAPSULE | Freq: Every day | ORAL | Status: DC
  Administered 2018-05-19: 40 mg via ORAL
  Filled 2018-05-19 (×2): qty 2

## 2018-05-19 MED ORDER — MIDAZOLAM HCL 2 MG/2ML IJ SOLN
2.0000 mg | INTRAMUSCULAR | Status: DC | PRN
  Administered 2018-05-20: 2 mg via INTRAVENOUS
  Filled 2018-05-19 (×2): qty 2

## 2018-05-19 MED ORDER — ACETAMINOPHEN 650 MG RE SUPP: 650.0000 mg | Freq: Four times a day (QID) | RECTAL | Status: DC | PRN

## 2018-05-19 MED ORDER — FENTANYL CITRATE (PF) 100 MCG/2ML IJ SOLN
INTRAMUSCULAR | Status: AC
  Filled 2018-05-19: qty 4

## 2018-05-19 MED ORDER — ACETAMINOPHEN 325 MG PO TABS
650.0000 mg | ORAL_TABLET | Freq: Once | ORAL | Status: AC
  Administered 2018-05-19: 650 mg via ORAL
  Filled 2018-05-19: qty 2

## 2018-05-19 MED ORDER — ONDANSETRON HCL 4 MG/2ML IJ SOLN
4.0000 mg | Freq: Once | INTRAMUSCULAR | Status: AC
  Administered 2018-05-19: 4 mg via INTRAVENOUS
  Filled 2018-05-19: qty 2

## 2018-05-19 MED ORDER — BENZONATATE 100 MG PO CAPS
100.0000 mg | ORAL_CAPSULE | Freq: Three times a day (TID) | ORAL | Status: DC
  Administered 2018-05-19: 100 mg via ORAL
  Filled 2018-05-19 (×2): qty 1

## 2018-05-19 MED ORDER — MELATONIN 3 MG PO TABS
3.0000 | ORAL_TABLET | Freq: Every day | ORAL | Status: DC
  Filled 2018-05-19: qty 3

## 2018-05-19 MED ORDER — DOCUSATE SODIUM 50 MG/5ML PO LIQD
100.0000 mg | Freq: Two times a day (BID) | ORAL | Status: DC | PRN
  Administered 2018-05-27 – 2018-05-28 (×2): 100 mg
  Filled 2018-05-19 (×3): qty 10

## 2018-05-19 MED ORDER — SODIUM CHLORIDE 0.9 % IV SOLN
500.0000 mg | INTRAVENOUS | Status: DC
  Administered 2018-05-20 – 2018-05-21 (×2): 500 mg via INTRAVENOUS
  Filled 2018-05-19 (×2): qty 500

## 2018-05-19 MED ORDER — CYCLOBENZAPRINE HCL 5 MG PO TABS
5.0000 mg | ORAL_TABLET | Freq: Three times a day (TID) | ORAL | Status: DC
  Administered 2018-05-19: 5 mg via ORAL
  Filled 2018-05-19: qty 1

## 2018-05-19 MED ORDER — SODIUM CHLORIDE 0.9 % IV SOLN
1.0000 g | INTRAVENOUS | Status: DC
  Administered 2018-05-19: 1 g via INTRAVENOUS
  Filled 2018-05-19: qty 10

## 2018-05-19 MED ORDER — POTASSIUM CHLORIDE 10 MEQ/100ML IV SOLN
10.0000 meq | Freq: Once | INTRAVENOUS | Status: AC
  Administered 2018-05-19: 10 meq via INTRAVENOUS
  Filled 2018-05-19: qty 100

## 2018-05-19 MED ORDER — MIDAZOLAM HCL 2 MG/2ML IJ SOLN
2.0000 mg | INTRAMUSCULAR | Status: AC | PRN
  Administered 2018-05-19 – 2018-05-20 (×3): 2 mg via INTRAVENOUS
  Filled 2018-05-19 (×2): qty 2

## 2018-05-19 MED ORDER — FENTANYL CITRATE (PF) 100 MCG/2ML IJ SOLN
100.0000 ug | INTRAMUSCULAR | Status: DC | PRN
  Administered 2018-05-19 – 2018-05-20 (×2): 100 ug via INTRAVENOUS
  Filled 2018-05-19 (×3): qty 2

## 2018-05-19 MED ORDER — POTASSIUM CHLORIDE 10 MEQ/100ML IV SOLN
10.0000 meq | INTRAVENOUS | Status: AC
  Administered 2018-05-19 (×3): 10 meq via INTRAVENOUS
  Filled 2018-05-19 (×3): qty 100

## 2018-05-19 MED ORDER — MIDAZOLAM HCL 2 MG/2ML IJ SOLN
INTRAMUSCULAR | Status: AC
  Filled 2018-05-19: qty 4

## 2018-05-19 MED ORDER — SODIUM CHLORIDE 0.9 % IV SOLN
500.0000 mg | Freq: Once | INTRAVENOUS | Status: AC
  Administered 2018-05-19: 500 mg via INTRAVENOUS
  Filled 2018-05-19: qty 500

## 2018-05-19 MED ORDER — FENTANYL CITRATE (PF) 100 MCG/2ML IJ SOLN
100.0000 ug | INTRAMUSCULAR | Status: DC | PRN
  Administered 2018-05-20: 100 ug via INTRAVENOUS
  Filled 2018-05-19: qty 2

## 2018-05-19 MED ORDER — NOREPINEPHRINE 4 MG/250ML-% IV SOLN
INTRAVENOUS | Status: AC
  Filled 2018-05-19: qty 250

## 2018-05-19 MED ORDER — ZIPRASIDONE HCL 80 MG PO CAPS
80.0000 mg | ORAL_CAPSULE | Freq: Every day | ORAL | Status: DC
  Filled 2018-05-19: qty 1

## 2018-05-19 MED ORDER — DIVALPROEX SODIUM ER 500 MG PO TB24
500.0000 mg | ORAL_TABLET | Freq: Every day | ORAL | Status: DC
  Filled 2018-05-19: qty 1

## 2018-05-19 MED ORDER — CYCLOBENZAPRINE HCL 10 MG PO TABS: 5.0000 mg | ORAL_TABLET | Freq: Three times a day (TID) | ORAL | Status: DC | PRN

## 2018-05-19 MED ORDER — ONDANSETRON HCL 4 MG/2ML IJ SOLN: 4.0000 mg | Freq: Four times a day (QID) | INTRAMUSCULAR | Status: DC | PRN

## 2018-05-19 NOTE — Progress Notes (Signed)
Pt orientation to unit, room and routine. Information packet given to patient/family and safety video watched.  Admission INP armband ID verified with patient/family, and in place. SR up x 2, fall risk assessment complete with Patient and family verbalizing understanding of risks associated with falls. Pt verbalizes an understanding of how to use the call bell and to call for help before getting out of bed.  Skin, clean-dry- intact without evidence of bruising, or skin tears.   Pt received two unit of blood prior to admission, pt H&H order.  No evidence of skin break down noted on exam. Will cont to monitor and assist as needed.  [place], RN 05/19/2018 3:07 PM

## 2018-05-19 NOTE — Progress Notes (Signed)
GI UPDATE  I received a call from the hospitalist about this patient this evening. She passed a significant amount of red / maroon blood with clots this evening. She became tachycardic initially to 140s and hypotensive. Repeat Hgb after 2 units of PRBC she received at [facility] showed Hgb went from 6.0 to 4.1. Given her respiratory status and massive bleeding recommended transfer to the ICU and intubation for urgent endoscopy.  Given her history of melena over the past 4 days + significant NSAID use this has been presumed to be an upper GI bleed, however BUN is not as elevated as would expect in this situation, possible it could be lower bleed as well. She is receiving RBC transfusion ASAP and will be intubated in the ICU. Plan on urgent EGD to clear the upper tract. If this is an upper GI bleed we will provide endoscopic hemostasis if possible. If EGD negative, would proceed with CT angio and IR consultation. Discussed with patient who agreed with the plan. Once hemodynamics improve and resuscitated with blood will proceed with endoscopy. No family present at this time, will reach out to them over the phone.  [place] Cellar, MD [facility]

## 2018-05-19 NOTE — ED Notes (Signed)
Date and time results received: 05/19/18 0810 (use smartphrase ".now" to insert current time)  Test: Lactic Acid Critical Value: 5.6  Name of Provider Notified: Eulis Foster  Orders Received? Or Actions Taken?: Orders Received - See Orders for details

## 2018-05-19 NOTE — Progress Notes (Addendum)
MD. Posey Pronto was notified of the p'st first red bloodly stools, Doctor communicated that we need to obtain the lab work and monitor her hemoglobin and Hematocrit. Night shift was notify also of the pt bloody stools.  Pt had no complaints of SOB or signs of distress noted. BP 114/46, map 62,  HR 114, sat 94% on 3L.  Will continue to monitor.

## 2018-05-19 NOTE — Progress Notes (Addendum)
Called by RN, regarding worsening GI bleed, 3 episodes in an hour w/ clots, maroon stool -Vitals initially ok, then BP dropped to 24-11 systolic range, evaluated at bedside, drowsy but answers my questions, HR 100-130s -Hb down to 4.8 on PPI gtt -called [facility] and d/w Dr.Armbruster -ordered 3 units PRBC, Transfer to ICU, PCCM consulted for Intubation and then endoscopy   Domenic Polite, MD

## 2018-05-19 NOTE — ED Triage Notes (Signed)
Patient states flu like symptoms. Patient states she has been short of breath x 1 week.

## 2018-05-19 NOTE — Interval H&P Note (Signed)
History and Physical Interval Note:  05/19/2018 10:24 PM  Christine Ortega  has presented today for surgery, with the diagnosis of massive GI bleed  The various methods of treatment have been discussed with the patient and family. After consideration of risks, benefits and other options for treatment, the patient has consented to  Procedure(s) with comments: ESOPHAGOGASTRODUODENOSCOPY (EGD) (N/A) - egd at bedside in ICU, intubated as a surgical intervention .  The patient's history has been reviewed, patient examined, no change in status, stable for surgery.  I have reviewed the patient's chart and labs.  Questions were answered to the patient's satisfaction.     [place]

## 2018-05-19 NOTE — ED Notes (Signed)
Date and time results received: 05/19/18 0810 (use smartphrase ".now" to insert current time)  Test: K+ Critical Value: 2.6  Name of Provider Notified: Eulis Foster  Orders Received? Or Actions Taken?: Orders Received - See Orders for details

## 2018-05-19 NOTE — ED Notes (Signed)
Date and time results received: 05/19/18 0810 (use smartphrase ".now" to insert current time)  Test: Hgb  Critical Value: 6.0  Name of Provider Notified: Eulis Foster  Orders Received? Or Actions Taken?: Orders Received - See Orders for details

## 2018-05-19 NOTE — ED Notes (Signed)
Report given to [facility]  

## 2018-05-19 NOTE — Progress Notes (Signed)
[facility] PROGRESS NOTE  Patient: Christine Ortega OVP:[id number]   PCP: Default, Provider, MD DOB: 04/28/1960   DOA: 05/19/2018   DOS: 05/19/2018    Subjective: Patient seen at bedside. reports chronic persistent back pain, pleuritic chest pain from coughing, persistent shortnes of breath, no fever or chills or nausea or vomiting. No further BM since arrival. Family-daughter who is RN in SNF- concerned with volume overload.   Objective:    General: Alert, Awake and Oriented to Time, Place and Person. Appear in marked distress, affect appropriate flat Eyes: PERRL, Conjunctiva normal ENT: Oral Mucosa clear moist. Neck: difficult to assess  JVD, no Abnormal Mass Or lumps Cardiovascular: S1 and S2 Present, no Murmur, Peripheral Pulses Present Respiratory: increased respiratory effort, Bilateral Air entry equal and Decreased, no use of accessory muscle, bilateral basal Crackles, Occasional  wheezes Abdomen: Bowel Sound present, Soft and distended, no tenderness, Skin: no redness, no Rash, no induration Extremities: trace Pedal edema, no calf tenderness Neurologic: Grossly no focal neuro deficit. Bilaterally Equal motor strength  Assessment and plan: Med rec performed.  Patient is not taking Suboxone, Flexeril. Not taking any chronic pain medication right now other than antacids as mentioned. Depakote is once a day not 3 times daily. Not taking Mobic. Also not taking Ritalin or Klonopin  Sepsis with pneumonia and cough. Former smoker quit a week ago 1-1/2 pack a day history Hycodan for cough.  Mucinex.  Tessalon Perles. Duo nebs. Recheck lactic acid and pro calcitonin level.  Anemia. Recheck CBC INR APTT. L85 iron folic acid tomorrow morning. Reorder type and screen transfuse for hemoglobin less than 7. Per GI keep n.p.o. for emergent procedure. Continue gentle IV hydration.  Chronic back pain. Added Norco for now. PDMP reviewed.  Depression. On Depakote.  Continue.   Check levels.  Hypokalemia. Hypomagnesemia. Recheck.  Author: Berle Mull, MD [facility] 05/19/2018 6:00 PM   If 7PM-7AM, please contact night-coverage at [email or web address]

## 2018-05-19 NOTE — Progress Notes (Signed)
Pharmacy Antibiotic Note  Christine Ortega is a 58 y.o. female admitted on 05/19/2018 with pneumonia.  Pharmacy has been consulted for Vancomycin dosing.  Plan: Vancomycin 1000 mg IV every 24 hours.  Goal trough 15-20 mcg/mL.  Monitor labs, c/s, and vanco levels as indicated.  Height: 5\' 3"  (160 cm) Weight: 140 lb (63.5 kg) IBW/kg (Calculated) : 52.4  Temp (24hrs), Avg:97.6 F (36.4 C), Min:97.4 F (36.3 C), Max:97.8 F (36.6 C)  Recent Labs  Lab 05/19/18 0700 05/19/18 0737 05/19/18 0920  WBC 21.4*  --   --   CREATININE 1.04*  --   --   LATICACIDVEN  --  5.6* 4.4*    Estimated Creatinine Clearance: 53.5 mL/min (A) (by C-G formula based on SCr of 1.04 mg/dL (H)).    Allergies  Allergen Reactions  . Ibuprofen Anaphylaxis  . Nsaids Anaphylaxis    Allergic to NSAIDs except for Excedrin    Antimicrobials this admission: Vanco 2/14 >>   Azith x 1 dose Ceftriaxone x 1 dose  Dose adjustments this admission: N/A  Microbiology results: 2/15 BCx: pending  Thank you for allowing pharmacy to be a part of this patient's care.  Ramond Craver 05/19/2018 10:59 AM

## 2018-05-19 NOTE — Consult Note (Signed)
NAME:  Christine Ortega, MRN:  [id number], DOB:  09/23/1960, LOS: 0 ADMISSION DATE:  05/19/2018, CONSULTATION DATE: 2 15 2020 REFERRING MD: GI services, CHIEF COMPLAINT: GI bleed, pneumonia, blood loss anemia shock  Brief History   58 year old with GI bleed to be intubated for endoscopy  History of present illness   58 year old female who was transferred from [facility] 05/19/2018 after developing GI bleed blood loss anemia.  While at [facility] she became hypotensive noted to be pale and was transferred to intensive care unit with intention of intubation for planned endoscopy plus or minus pressor support.  Been adequately resuscitated with blood products and fluids.  Blood pressure is responding to this current interventions.  We are intubating her at the crest of GI to the fact she has most likely a community-acquired pneumonia and due to her weakened state probably not tolerated endoscopy without developing respiratory failure.  He is ex-smoker having quit 1 month ago.  Chest x-ray is consistent with community-acquired pneumonia.  Past Medical History  Attention deficit disorder Schizophrenia Bipolar History of chronic opioid abuse History of tobacco abuse Overuse of NSAIDs   Significant Hospital Events   05/19/2018 due to excessive GI bleeding transferred to intensive care unit with plan for endoscopy  Consults:  05/19/2018 pulmonary critical care 05/19/2018 GI  Procedures:  05/19/2018 intubation  Significant Diagnostic Tests:  05/19/2018 plan for endoscopy>>  Micro Data:  05/19/2018 blood cultures x2 05/19/2018 urine culture 05/19/2018 sputum culture  Antimicrobials:  05/19/2018 ceftriaxone 05/19/2018 Zithromax  Interim history/subjective:  59 year old female with acute GI bleed being transferred to intensive care unit with intensive intubation transfusion endoscopy pressor support.  Objective   Blood pressure (!) 74/60, pulse (!) 115, temperature 98.5 F (36.9 C),  temperature source Oral, resp. rate (!) 28, height 5\' 3"  (1.6 m), weight 63.5 kg, SpO2 99 %.        Intake/Output Summary (Last 24 hours) at 05/19/2018 2115 Last data filed at 05/19/2018 1645 Gross per 24 hour  Intake 4594.54 ml  Output -  Net 4594.54 ml   Filed Weights   05/19/18 0650  Weight: 63.5 kg    Examination: General: Thin pale female who is awake follows commands HENT: Oropharynx is unremarkable.  No circumoral pallor is noted Lungs: Coarse rhonchi bilaterally Cardiovascular: Tach 108 Abdomen: Mild tenderness, passing flatus.  Has melena smell to it Extremities: Warm to touch positive pulses Neuro: Awake and follows commands. GU Foley with adequate urine output  Resolved Hospital Problem list     Assessment & Plan:  Shock secondary to blood loss anemia with hemoglobin noted be as low as 4.1.  In the setting of a GI bleed.  With multiple melena stools.  He has a history of use of NSAIDs for chronic pain. Transfer to intensive care unit She will need intubation for endoscopy Protonix drip per GI service Endoscopy per GI service May need pressor support Fluid resuscitation Blood transfusions  Vent dependent respiratory failure secondary to blood loss anemia, shock and need to perform endoscopy. Suspected pneumonia Vent bundle Portable chest x-ray Arterial blood gas History of smoking therefore she will most likely need bronchodilators Antimicrobial therapy  History attention deficit disorder, bipolar, depression, schizophrenia chronic pain Holding Geodon at this time Fentanyl and Versed for tube tolerance should cover her anxiety issues  Best practice:  Diet: N.p.o. Pain/Anxiety/Delirium protocol (if indicated): Sedation when intubated for tube tolerance VAP protocol (if indicated): Positive DVT prophylaxis: Compression hose no anticoagulation with GI bleed  GI prophylaxis: PPI drip Glucose control: Sliding scale insulin Mobility: Bedrest Code Status:  Full Family Communication: Immediate bedside 05/19/2018 2100 hrs. Disposition: Respiratory intensive care unit, she will need intubation, possible central line, possible pressor support.  Labs   CBC: Recent Labs  Lab 05/19/18 0700 05/19/18 1839 05/19/18 2039  WBC 21.4* 15.3* 14.5*  NEUTROABS 15.6* 10.8*  --   HGB 6.0* 4.8* 4.1*  HCT 20.3* 16.2* 13.6*  MCV 109.1* 100.6* 99.3  PLT 811* 479* 436*    Basic Metabolic Panel: Recent Labs  Lab 05/19/18 0700 05/19/18 0737 05/19/18 1839  NA 137  --  141  K 2.6*  --  4.3  CL 97*  --  113*  CO2 22  --  21*  GLUCOSE 135*  --  86  BUN 16  --  14  CREATININE 1.04*  --  0.80  CALCIUM 10.3  --  7.5*  MG  --  1.9 1.5*   GFR: Estimated Creatinine Clearance: 69.6 mL/min (by C-G formula based on SCr of 0.8 mg/dL). Recent Labs  Lab 05/19/18 0700 05/19/18 0737 05/19/18 0920 05/19/18 1839 05/19/18 2039  PROCALCITON  --   --   --  0.68  --   WBC 21.4*  --   --  15.3* 14.5*  LATICACIDVEN  --  5.6* 4.4* 1.6  --     Liver Function Tests: Recent Labs  Lab 05/19/18 1839  AST 33  ALT 12  ALKPHOS 79  BILITOT 0.2*  PROT 5.0*  ALBUMIN 1.9*   No results for input(s): LIPASE, AMYLASE in the last 168 hours. Recent Labs  Lab 05/19/18 1839  AMMONIA 24    ABG No results found for: PHART, PCO2ART, PO2ART, HCO3, TCO2, ACIDBASEDEF, O2SAT   Coagulation Profile: Recent Labs  Lab 05/19/18 1839  INR 1.23    Cardiac Enzymes: No results for input(s): CKTOTAL, CKMB, CKMBINDEX, TROPONINI in the last 168 hours.  HbA1C: No results found for: HGBA1C  CBG: No results for input(s): GLUCAP in the last 168 hours.  Review of Systems:   Acutely ill unable to obtain  Past Medical History  She,  has a past medical history of ADD (attention deficit disorder), Anxiety, Bipolar disorder ([place]), Chronic pain, Depression, Narcotic abuse in remission ([place]), Schizophrenia ([place]), and Seizures ([place]).   Surgical History    Past Surgical History:    Procedure Laterality Date  . BACK SURGERY       Social History   reports that she has quit smoking. She smoked 0.50 packs per day. She has never used smokeless tobacco. She reports that she does not drink alcohol or use drugs.   Family History   Her family history is not on file.   Allergies No Active Allergies   Home Medications  Prior to Admission medications   Medication Sig Start Date End Date Taking? Authorizing Provider  Aspirin-Salicylamide-Caffeine (ARTHRITIS STRENGTH BC POWDER PO) Take 2 packets by mouth 5 (five) times daily as needed (pain/headache).   Yes [provider]  divalproex (DEPAKOTE ER) 500 MG 24 hr tablet Take 500 mg by mouth at bedtime. For moods 03/08/18  Yes [provider]  FLUoxetine (PROZAC) 40 MG capsule Take 1 capsule (40 mg total) by mouth daily. For depression and anxiety. 08/08/11  Yes Mashburn, Marlane Hatcher, PA-C  ibuprofen (ADVIL,MOTRIN) 200 MG tablet Take 400-600 mg by mouth every 6 (six) hours as needed for headache (pain).   Yes [provider]  Melatonin 5 MG CAPS Take 20 mg  by mouth at bedtime.    Yes [provider]  naproxen (NAPROSYN) 500 MG tablet Take 500-1,000 mg by mouth 3 (three) times daily as needed (pain/headache).  03/22/18  Yes [provider]  Probiotic Product (PROBIOTIC PO) Take 1 tablet by mouth daily. Women's probiotic for GI health   Yes [provider]  ziprasidone (GEODON) 80 MG capsule Take one capsule at bedtime for depression and mood stability. Patient taking differently: Take 80 mg by mouth at bedtime. for depression and mood stability. 08/08/11  Yes Ruben Im, PA-C     Critical care time: 45 min     Richardson Landry Meadow Abramo ACNP Maryanna Shape PCCM Pager [phone] till 1 pm If no answer page [phone] 05/19/2018, 9:16 PM

## 2018-05-19 NOTE — Progress Notes (Signed)
Got report on patient at 108pm.  Daughter reported 3 bloody bowel movements within last hour.  Got critical value from lab, hgb 4.8.  Pt. BP running soft and tachy.  Called MD on call.  Dr. Broadus John responded.  Started blood.  Got help from charge and SWOT nurses.  Transferred pt over to 65M.  Pt daughter April notified.

## 2018-05-19 NOTE — ED Provider Notes (Signed)
[facility] EMERGENCY DEPARTMENT Provider Note   CSN: [id number] Arrival date & time: 05/19/18  0102     History   Chief Complaint Chief Complaint  Patient presents with  . Influenza    HPI Christine Ortega is a 58 y.o. female.  HPI  She presents for evaluation of cough for 2 weeks productive of "white pus."  She denies fever, hemoptysis, chest pain, focal weakness or paresthesia.  She denies being a cigarette smoker.  She did not take a flu immunization, this year.  No other recent illnesses.  She denies nausea, vomiting, abdominal or back pain.  There is been no change in her bowel or urinary habits.  There are no other known modifying factors.  Past Medical History:  Diagnosis Date  . ADD (attention deficit disorder)   . Anxiety   . Bipolar disorder ([place])   . Chronic pain   . Depression   . Narcotic abuse in remission ([place])   . Schizophrenia ([place])   . Seizures [facility])     Patient Active Problem List   Diagnosis Date Noted  . Schizophrenia, paranoid type ([place]) 08/08/2011  . Narcotic abuse in remission ([place])   . Bipolar disorder ([place])   . Chronic pain   . ADD (attention deficit disorder)     Past Surgical History:  Procedure Laterality Date  . BACK SURGERY       OB History   No obstetric history on file.      Home Medications    Prior to Admission medications   Medication Sig Start Date End Date Taking? Authorizing Provider  divalproex (DEPAKOTE) 250 MG DR tablet Take 250 mg by mouth 3 (three) times daily.   Yes [provider]  FLUoxetine (PROZAC) 40 MG capsule Take 1 capsule (40 mg total) by mouth daily. For depression and anxiety. 08/08/11  Yes Mashburn, Marlane Hatcher, PA-C  ziprasidone (GEODON) 80 MG capsule Take one capsule at bedtime for depression and mood stability. 08/08/11  Yes Mashburn, Marlane Hatcher, PA-C  buprenorphine-naloxone (SUBOXONE) 8-2 MG SUBL Place 1 tablet under the tongue daily.    [provider]  clonazePAM (KLONOPIN) 1 MG tablet  Take at 8AM, 2pm, and at bedtime for anxiety. 08/08/11   Ruben Im, PA-C  cyclobenzaprine (FLEXERIL) 10 MG tablet Take one tablet every eight hours if needed for muscle spasm. 08/08/11   Ruben Im, PA-C  meloxicam (MOBIC) 7.5 MG tablet Take 7.5 mg by mouth daily. 03/13/18   [provider]  methylphenidate (RITALIN) 20 MG tablet Take 20 mg by mouth 3 (three) times daily.    [provider]  naproxen (NAPROSYN) 500 MG tablet Take 500 mg by mouth 2 (two) times daily. 03/22/18   [provider]    Family History History reviewed. No pertinent family history.  Social History Social History   Tobacco Use  . Smoking status: Former Smoker    Packs/day: 0.50  . Smokeless tobacco: Never Used  Substance Use Topics  . Alcohol use: No  . Drug use: No     Allergies   Ibuprofen and Nsaids   Review of Systems Review of Systems   Physical Exam Updated Vital Signs BP (!) 129/106 (BP Location: Left Arm)   Pulse (!) 118   Temp (!) 97.4 F (36.3 C) (Rectal)   Resp 20   Ht 5\' 3"  (1.6 m)   Wt 63.5 kg   SpO2 95%   BMI 24.80 kg/m   Physical Exam Vitals signs  and nursing note reviewed.  Constitutional:      General: She is in acute distress (Uncomfortable).     Appearance: She is well-developed. She is ill-appearing. She is not toxic-appearing or diaphoretic.     Comments: She appears under nourished  HENT:     Head: Normocephalic and atraumatic.     Right Ear: External ear normal.     Left Ear: External ear normal.     Mouth/Throat:     Mouth: Mucous membranes are moist.     Pharynx: No oropharyngeal exudate or posterior oropharyngeal erythema.  Eyes:     Conjunctiva/sclera: Conjunctivae normal.     Pupils: Pupils are equal, round, and reactive to light.  Neck:     Musculoskeletal: Normal range of motion and neck supple.     Trachea: Phonation normal.  Cardiovascular:     Rate and Rhythm: Regular rhythm. Tachycardia present.     Heart  sounds: Normal heart sounds.  Pulmonary:     Effort: Pulmonary effort is normal. No respiratory distress.     Breath sounds: No stridor.     Comments: Scattered rhonchi.  No increased work of breathing. Chest:     Chest wall: No tenderness.  Abdominal:     General: There is no distension.     Palpations: Abdomen is soft.     Tenderness: There is no abdominal tenderness.  Genitourinary:    Comments: Rectal exam: Normal anus.  Small amount of dark, stool in rectal vault.  No evidence of gross bleeding. Musculoskeletal: Normal range of motion.        General: No swelling or tenderness.     Right lower leg: No edema.     Left lower leg: No edema.  Skin:    General: Skin is warm and dry.  Neurological:     Mental Status: She is alert and oriented to person, place, and time.     Cranial Nerves: No cranial nerve deficit.     Sensory: No sensory deficit.     Motor: No abnormal muscle tone.     Coordination: Coordination normal.  Psychiatric:        Behavior: Behavior normal.        Thought Content: Thought content normal.        Judgment: Judgment normal.      ED Treatments / Results  Labs (all labs ordered are listed, but only abnormal results are displayed) Labs Reviewed  LACTIC ACID, PLASMA - Abnormal; Notable for the following components:      Result Value   Lactic Acid, Venous 5.6 (*)    All other components within normal limits  LACTIC ACID, PLASMA - Abnormal; Notable for the following components:   Lactic Acid, Venous 4.4 (*)    All other components within normal limits  BASIC METABOLIC PANEL - Abnormal; Notable for the following components:   Potassium 2.6 (*)    Chloride 97 (*)    Glucose, Bld 135 (*)    Creatinine, Ser 1.04 (*)    GFR calc non Af Amer 60 (*)    Anion gap 18 (*)    All other components within normal limits  CBC WITH DIFFERENTIAL/PLATELET - Abnormal; Notable for the following components:   WBC 21.4 (*)    RBC 1.86 (*)    Hemoglobin 6.0 (*)     HCT 20.3 (*)    MCV 109.1 (*)    MCHC 29.6 (*)    RDW 20.4 (*)    Platelets 811 (*)  nRBC 0.8 (*)    Neutro Abs 15.6 (*)    Lymphs Abs 4.3 (*)    Monocytes Absolute 1.1 (*)    Abs Immature Granulocytes 0.22 (*)    All other components within normal limits  URINALYSIS, ROUTINE W REFLEX MICROSCOPIC - Abnormal; Notable for the following components:   APPearance CLOUDY (*)    Hgb urine dipstick SMALL (*)    Protein, ur 30 (*)    Leukocytes,Ua LARGE (*)    WBC, UA >50 (*)    Bacteria, UA RARE (*)    All other components within normal limits  POC OCCULT BLOOD, ED - Abnormal; Notable for the following components:   Fecal Occult Bld POSITIVE (*)    All other components within normal limits  CULTURE, BLOOD (ROUTINE X 2)  CULTURE, BLOOD (ROUTINE X 2)  MRSA PCR SCREENING  INFLUENZA PANEL BY PCR (TYPE A & B)  MAGNESIUM  PREPARE RBC (CROSSMATCH)  TYPE AND SCREEN  ABO/RH    EKG EKG Interpretation  Date/Time:  Saturday May 19 2018 06:53:41 EST Ventricular Rate:  135 PR Interval:    QRS Duration: 86 QT Interval:  315 QTC Calculation: 473 R Axis:   55 Text Interpretation:  Sinus tachycardia Paired ventricular premature complexes Aberrant complex Minimal ST depression, lateral leads Baseline wander in lead(s) V6 No previous ECGs available Confirmed by Ezequiel Essex [phone]) on 05/19/2018 7:01:52 AM   Radiology Dg Chest 2 View  Result Date: 05/19/2018 CLINICAL DATA:  Shortness of breath and cough EXAM: CHEST - 2 VIEW COMPARISON:  None. FINDINGS: There is diffuse reticulonodular interstitial disease throughout the lungs bilaterally. No consolidation. Heart size and pulmonary vascularity are normal. No adenopathy. No bone lesions. IMPRESSION: Widespread reticulonodular interstitial disease without consolidation. Question chronicity of this interstitial disease. Differential considerations must include atypical infection, somewhat atypical appearance of edema secondary to congestive  heart failure, allergic type phenomenon, and possible underlying noncardiogenic interstitial disease. Short interval follow-up chest radiograph advised in this circumstance; repeat study in 2-3 weeks after appropriate therapy based on clinical symptoms advised. Heart size and pulmonary vascularity are normal. No adenopathy evident. Electronically Signed   By: Lowella Grip III M.D.   On: 05/19/2018 08:21    Procedures .Critical Care Performed by: Daleen Bo, MD Authorized by: Daleen Bo, MD   Critical care provider statement:    Critical care time (minutes):  35   Critical care start time:  05/19/2018 7:00 AM   Critical care end time:  05/19/2018 10:32 AM   Critical care time was exclusive of:  Separately billable procedures and treating other patients   Critical care was necessary to treat or prevent imminent or life-threatening deterioration of the following conditions:  Shock   Critical care was time spent personally by me on the following activities:  Blood draw for specimens, development of treatment plan with patient or surrogate, discussions with consultants, evaluation of patient's response to treatment, examination of patient, obtaining history from patient or surrogate, ordering and performing treatments and interventions, ordering and review of laboratory studies, pulse oximetry, re-evaluation of patient's condition, review of old charts and ordering and review of radiographic studies   (including critical care time)  Medications Ordered in ED Medications  potassium chloride 10 mEq in 100 mL IVPB (10 mEq Intravenous New Bag/Given 05/19/18 1049)  vancomycin (VANCOCIN) IVPB 1000 mg/200 mL premix (has no administration in time range)  sodium chloride 0.9 % bolus 1,000 mL (0 mLs Intravenous Stopped 05/19/18 0844)  guaiFENesin-dextromethorphan (ROBITUSSIN DM)  100-10 MG/5ML syrup 5 mL (5 mLs Oral Given 05/19/18 0738)  sodium chloride 0.9 % bolus 1,000 mL (0 mLs Intravenous Stopped  05/19/18 1000)  cefTRIAXone (ROCEPHIN) 1 g in sodium chloride 0.9 % 100 mL IVPB (0 g Intravenous Stopped 05/19/18 0912)  azithromycin (ZITHROMAX) 500 mg in sodium chloride 0.9 % 250 mL IVPB (0 mg Intravenous Stopped 05/19/18 1022)  potassium chloride 10 mEq in 100 mL IVPB (0 mEq Intravenous Stopped 05/19/18 0948)  sodium chloride 0.9 % bolus 1,000 mL (0 mLs Intravenous Stopped 05/19/18 0948)  vancomycin (VANCOCIN) 1,250 mg in sodium chloride 0.9 % 250 mL IVPB (1,250 mg Intravenous New Bag/Given 05/19/18 0933)  acetaminophen (TYLENOL) tablet 650 mg (650 mg Oral Given 05/19/18 0923)  ondansetron (ZOFRAN) injection 4 mg (4 mg Intravenous Given 05/19/18 0938)  fentaNYL (SUBLIMAZE) injection 50 mcg (50 mcg Intravenous Given 05/19/18 1046)     Initial Impression / Assessment and Plan / ED Course  I have reviewed the triage vital signs and the nursing notes.  Pertinent labs & imaging results that were available during my care of the patient were reviewed by me and considered in my medical decision making (see chart for details).  Clinical Course as of May 20 1115  Sat May 19, 2018  4163 Creatinine(!): 1.04 [SH]  0815 Abnormal chest x-ray requires aggressive treatment.  Lactate elevated.  Sirs positive with apparent sepsis.  Potassium low.   [EW]  [id number] At this time blood pressure was 108/44.  Patient is afebrile, central, by Foley catheter.   [EW]  [id number] Normal  Influenza panel by PCR (type A & B) [EW]  0938 Abnormal, elevated white count, low hemoglobin, elevated MCV, left shift.  CBC with Differential(!!) [EW]  [id number] Abnormal, blood present  POC occult blood, ED(!) [EW]  [phone] Abnormal, elevated  Lactic acid, plasma(!!) [EW]  0938 Abnormal, low potassium, elevated glucose, elevated creatinine, low GFR and elevated anion gap  Basic metabolic panel(!!) [EW]  6468 Case discussed with intensivist, Dr. Doyne Keel.  Currently blood pressure 120/105.  He states that the patient can be maintained at this  facility, with appropriate support including vasopressors as needed.  Will contact hospitalist service here.   [EW]  1030 Elevated but improving.  Lactic acid, plasma(!!) [EW]  1053 Patient had transient hypoxia, which improved with adjusting the oxygen saturation monitor, and increasing her oxygen to 3 L by nasal cannula.  Now saturation is 93%.  Patient complained of back pain, while she was having the transient hypoxia.   [EW]    Clinical Course User Index [EW] Daleen Bo, MD [SH] William Hamburger, Student-PA     Patient Vitals for the past 24 hrs:  BP Temp Temp src Pulse Resp SpO2 Height Weight  05/19/18 1036 (!) 129/106 - - (!) 118 20 95 % - -  05/19/18 0930 (!) 108/44 - - (!) 109 (!) 24 98 % - -  05/19/18 0921 - (!) 97.4 F (36.3 C) Rectal - - - - -  05/19/18 0900 (!) 100/51 - - (!) 107 - 98 % - -  05/19/18 0830 (!) 80/64 - - - (!) 23 - - -  05/19/18 0730 (!) 73/61 - - (!) 140 (!) 23 93 % - -  05/19/18 0700 (!) 93/40 - - - (!) 27 - - -  05/19/18 0650 - - - - - - 5\' 3"  (1.6 m) 63.5 kg  05/19/18 0649 (!) 99/31 97.8 F (36.6 C) Oral - (!) 22 93 % - -  Sepsis - Repeat Assessment  Performed at:    9:15 AM  Vitals     Blood pressure (!) 108/44, pulse (!) 109, temperature (!) 97.4 F (36.3 C), temperature source Rectal, resp. rate (!) 24, height 5\' 3"  (1.6 m), weight 63.5 kg, SpO2 98 %.  Heart:     Tachycardic  Lungs:    Rhonchi  Capillary Refill:   > 2 sec  Peripheral Pulse:   Radial pulse palpable  Skin:     Pale    9:47 AM Reevaluation with update and discussion. After initial assessment and treatment, an updated evaluation reveals he is fairly comfortable, mentating well.  After Foley catheter placed she has produced about 150 cc of urine.  Findings discussed with the patient, she agrees with plan. Daleen Bo   Medical Decision Making: Patient with respiratory illness, signs and symptoms of severe sepsis, blood pressure improving with IV fluids.  Rectal  bleeding present, with significant anemia, requiring transfusion, and will need to be worked up further.  Hyperkalemia present, treated with IV supplementation.  Patient does not require vasopressors, as of 9:45 AM.  ICU consulted for admission.  CRITICAL CARE- yes Performed by: Daleen Bo  Nursing Notes Reviewed/ Care Coordinated Applicable Imaging Reviewed Interpretation of Laboratory Data incorporated into ED treatment  10:33 AM-Consult complete with hospitalist. Patient case explained and discussed.  He agrees to admit patient for further evaluation and treatment. Call ended at 11:18 AM  Plan: Admit  Final Clinical Impressions(s) / ED Diagnoses   Final diagnoses:  Sepsis with acute organ dysfunction and septic shock, due to unspecified organism, unspecified type ([place])  Iron deficiency anemia due to chronic blood loss  Community acquired pneumonia, unspecified laterality  Hypokalemia  Rectal bleeding    ED Discharge Orders    None       Daleen Bo, MD 05/19/18 1119

## 2018-05-19 NOTE — Consult Note (Signed)
Consultation  Referring Provider:   Orson Eva MD   Primary Care Physician:  Default, Provider, MD Primary Gastroenterologist:        NA Reason for Consultation:     GI bleed         HPI:   Christine Ortega is a 58 y.o. female with chronic pain, history of schizophrenia, seizure disorder, who presented to [facility] with shortness of breath and fevers. She also endorsed melena. We were consulted by Dr. Carles Collet for transfer as Forestine Na has no GI coverage this weekend. She states she has chronic back pain, has been taking ibuprofen - 2 tabs every 4-6 hours as well as BC powder every 6 hours. About 4 days ago she reports she had a "massive black tarry bowel movement", and then had another black bowel movement about 24 hours ago. No bowel movements since that time. She has ongoing shortness of breath and feels "terrible.   In the ED she was tachycardic to 120s and hypotensive with 70/60s BP. She was noted to have Hgb of 6.0. She was given 2 units PRBC as well. BP and HR have since improved. She tested negative for flu. CXR shows widespread interstitial infiltrate concerning for pneumonia. She has a WBC of 21.4 and has been started on ceftriazone, azithromycin, and vancomycin. Her oxygen sats range from high 80s to low 90s on 3 L [place].  She denies any history of GI bleeding. She has never had an EGD or colonoscopy. She does not follow with any GI practice. Her main complaint is chronic back pain and asking what she can be given for that. She denies any abdominal pain or vomiting.   Past Medical History:  Diagnosis Date  . ADD (attention deficit disorder)   . Anxiety   . Bipolar disorder ([place])   . Chronic pain   . Depression   . Narcotic abuse in remission ([place])   . Schizophrenia ([place])   . Seizures ([place])     Past Surgical History:  Procedure Laterality Date  . BACK SURGERY      History reviewed. No pertinent family history.  No CRC  Social History   Tobacco Use  . Smoking  status: Former Smoker    Packs/day: 0.50  . Smokeless tobacco: Never Used  Substance Use Topics  . Alcohol use: No  . Drug use: No    Prior to Admission medications   Medication Sig Start Date End Date Taking? Authorizing Provider  divalproex (DEPAKOTE) 250 MG DR tablet Take 250 mg by mouth 3 (three) times daily.   Yes [provider]  FLUoxetine (PROZAC) 40 MG capsule Take 1 capsule (40 mg total) by mouth daily. For depression and anxiety. 08/08/11  Yes Mashburn, Marlane Hatcher, PA-C  ziprasidone (GEODON) 80 MG capsule Take one capsule at bedtime for depression and mood stability. 08/08/11  Yes Mashburn, Marlane Hatcher, PA-C  buprenorphine-naloxone (SUBOXONE) 8-2 MG SUBL Place 1 tablet under the tongue daily.    [provider]  clonazePAM (KLONOPIN) 1 MG tablet Take at 8AM, 2pm, and at bedtime for anxiety. 08/08/11   Ruben Im, PA-C  cyclobenzaprine (FLEXERIL) 10 MG tablet Take one tablet every eight hours if needed for muscle spasm. 08/08/11   Ruben Im, PA-C  meloxicam (MOBIC) 7.5 MG tablet Take 7.5 mg by mouth daily. 03/13/18   [provider]  methylphenidate (RITALIN) 20 MG tablet Take 20 mg by mouth 3 (three) times daily.  [provider]  naproxen (NAPROSYN) 500 MG tablet Take 500 mg by mouth 2 (two) times daily. 03/22/18   [provider]    Current Facility-Administered Medications  Medication Dose Route Frequency Provider Last Rate Last Dose  . 0.9 % NaCl with KCl 20 mEq/ L  infusion   Intravenous Continuous Tat, David, MD 100 mL/hr at 05/19/18 1518    . cefTRIAXone (ROCEPHIN) 1 g in sodium chloride 0.9 % 100 mL IVPB  1 g Intravenous Q24H Tat, David, MD 200 mL/hr at 05/19/18 1455 1 g at 05/19/18 1455  . pantoprazole (PROTONIX) 80 mg in sodium chloride 0.9 % 250 mL (0.32 mg/mL) infusion  8 mg/hr Intravenous Continuous Tat, David, MD 25 mL/hr at 05/19/18 1431 8 mg/hr at 05/19/18 1431  . [START ON 05/20/2018] vancomycin (VANCOCIN) IVPB 1000  mg/200 mL premix  1,000 mg Intravenous Q24H Daleen Bo, MD        Allergies as of 05/19/2018 - Review Complete 05/19/2018  Allergen Reaction Noted  . Ibuprofen Anaphylaxis 08/05/2011  . Nsaids Anaphylaxis 08/05/2011     Review of Systems:    As per HPI, otherwise negative    Physical Exam:  Vital signs in last 24 hours: Temp:  [97.4 F (36.3 C)-98.2 F (36.8 C)] 98.2 F (36.8 C) (02/15 1451) Pulse Rate:  [104-140] 107 (02/15 1451) Resp:  [16-27] 21 (02/15 1451) BP: (73-129)/(31-106) 95/75 (02/15 1451) SpO2:  [93 %-100 %] 96 % (02/15 1451) Weight:  [63.5 kg] 63.5 kg (02/15 0650) Last BM Date: 05/18/18 General:   Pleasant female, working hard to breath Neck:  Supple Lungs: Respirations even but labored. Some coarse BS B.    Heart:  Mild tachycardia Abdomen:  Soft, nondistended, nontender. No appreciable masses or hepatomegaly.  Rectal:  Not performed.  Msk:  Symmetrical without gross deformities.  Extremities:  Without edema. Neurologic:  Alert and  oriented x4;  grossly normal neurologically. Skin:  Intact without significant lesions or rashes. Psych:  Alert and cooperative. Normal affect.  LAB RESULTS: Recent Labs    05/19/18 0700  WBC 21.4*  HGB 6.0*  HCT 20.3*  PLT 811*   BMET Recent Labs    05/19/18 0700  NA 137  K 2.6*  CL 97*  CO2 22  GLUCOSE 135*  BUN 16  CREATININE 1.04*  CALCIUM 10.3   LFT No results for input(s): PROT, ALBUMIN, AST, ALT, ALKPHOS, BILITOT, BILIDIR, IBILI in the last 72 hours. PT/INR No results for input(s): LABPROT, INR in the last 72 hours.  STUDIES: Dg Chest 2 View  Result Date: 05/19/2018 CLINICAL DATA:  Shortness of breath and cough EXAM: CHEST - 2 VIEW COMPARISON:  None. FINDINGS: There is diffuse reticulonodular interstitial disease throughout the lungs bilaterally. No consolidation. Heart size and pulmonary vascularity are normal. No adenopathy. No bone lesions. IMPRESSION: Widespread reticulonodular interstitial  disease without consolidation. Question chronicity of this interstitial disease. Differential considerations must include atypical infection, somewhat atypical appearance of edema secondary to congestive heart failure, allergic type phenomenon, and possible underlying noncardiogenic interstitial disease. Short interval follow-up chest radiograph advised in this circumstance; repeat study in 2-3 weeks after appropriate therapy based on clinical symptoms advised. Heart size and pulmonary vascularity are normal. No adenopathy evident. Electronically Signed   By: Lowella Grip III M.D.   On: 05/19/2018 08:21        Impression / Plan:  58 y/o female with history of significant NSAID use for chronic pain, presenting with shortness of breath / leukocytosis /  abnormal CXR, and symptoms of melena with severe anemia. She has a severe pneumonia / sepsis, along with an upper GI bleed. Her GI bleeding symptoms have been ongoing for at least 4 days, seems to be a slow bleed right now, her last BM was about 24 hours ago. She is on supplemental oxygen but having some labored respirations. I suspect she likely has NSAID related PUD causing her bleeding and she warrants an endoscopy, however would prefer her respiratory status to be significantly improved prior to sedating her, do not think she would tolerate it very well right now.   Recommend: - continue NPO in case emergent endoscopy is needed - please recheck post transfusion Hgb and trend, transfuse to Hgb > 7 - continue IV protonix - no NSAIDs - await course with treatment of pneumonia. No plans for urgent EGD at this time due to her respiratory status unless she has significant active bleeding. In that situation she would likely need intubation.   Will follow, call with questions.   Iberia Cellar, MD [facility]

## 2018-05-19 NOTE — Procedures (Signed)
Intubation Procedure Note NAJIA HURLBUTT [id number] 1960-12-26  Procedure: Intubation Indications: Airway protection and maintenance  Procedure Details Consent: Risks of procedure as well as the alternatives and risks of each were explained to the (patient/caregiver).  Consent for procedure obtained. Time Out: Verified patient identification, verified procedure, site/side was marked, verified correct patient position, special equipment/implants available, medications/allergies/relevent history reviewed, required imaging and test results available.  Performed   Sabra Heck and 3    Evaluation Hemodynamic Status: prior to procedure became hypertensive for which she received a total of 100 mcg of fentanyl with improved BP. After induction her BP dropped to MAP 60's so levophed 2 mcg started with improvement; O2 sats: stable throughout Patient's Current Condition: stable Complications: No apparent complications Patient did tolerate procedure well. Chest X-ray ordered to verify placement.  CXR: pending.   Mali Ranvir Renovato 05/19/2018

## 2018-05-19 NOTE — H&P (Addendum)
History and Physical  Christine Ortega EPP:[id number] DOB: 1960/04/13 DOA: 05/19/2018   PCP: Default, Provider, MD   Patient coming from: Home  Chief Complaint: sob, cough  HPI:  Christine Ortega is a 58 y.o. female with medical history of bipolar disorder, narcotic dependence, anxiety, and chronic back pain presenting with 2-week history of shortness of breath and coughing with white sputum.  The patient has had some subjective fevers and chills associated with shortness of breath.  She denies any chest pain, headache, sore throat, neck pain.  Because of her chronic back pain, the patient has been taking ibuprofen 2 tablets every 4 to 6 hours as well as 1 BC every 6 hours for at least the past month.  She denies any vomiting but continues to complain of nausea without any abdominal pain.  The patient states that she has had dark stools without any hematochezia.  There is been no hematemesis or hemoptysis.  The patient denies any recent sick contacts or travels. In the emergency department, the patient was afebrile but was initially tachycardic up to 120.  Her initial blood pressure was 73/61.  After 3 L of fluid, the patient's blood pressure has been in the low 90s.  WBC was 21.4 with hemoglobin 6.0 and platelets 211,000.  BMP showed a potassium of 2.6 with serum creatinine 1.04.  Chest x-ray showed widespread reticulonodular interstitial disease.  FOBT showed dark stool without any blood.  Initial lactic acid was 5.6.  Influenza PCR was negative.  UA showed pyuria greater than 50 WBC.  EKG was sinus tachycardia with nonspecific ST changes.  The patient was given vancomycin, azithromycin, and ceftriaxone.  Assessment/Plan: Sepsis -present on admission -Secondary to pneumonia, possibly urinary source -Continue ceftriaxone 2 g daily and azithromycin -Lactic acid peaked at 5.6 -Check procalcitonin -Continue IV fluids  Pneumonia -Urine Legionella antigen -Urine Streptococcus pneumoniae  antigen  Pyuria -UA greater than 50 WBCs -Obtain urine culture -Continue ceftriaxone pending culture data  Acute blood loss anemia/melena -Patient presents with hemoglobin 6.0 -08/25/2011 hemoglobin 12.3 -Patient has been overusing NSAIDs as discussed above -GI has been consulted--case discussed with Dr. Havery Moros who will see pt after transfer to [facility] -start protonix drip -clear liquids for now -iron studies -serum B12 -RBC folate -2 units PRBC ordered in ED  Opioid dependence -PMP Aware queried--patient has not received hydrocodone since 12/25/2017.  She has not received clonazepam since 09/04/2017 -judicious pain control  Thrombocytosis -acute phase reactant vs iron deficiency -iron studies        Past Medical History:  Diagnosis Date  . ADD (attention deficit disorder)   . Anxiety   . Bipolar disorder ([place])   . Chronic pain   . Depression   . Narcotic abuse in remission ([place])   . Schizophrenia ([place])   . Seizures ([place])    Past Surgical History:  Procedure Laterality Date  . BACK SURGERY     Social History:  reports that she has quit smoking. She smoked 0.50 packs per day. She has never used smokeless tobacco. She reports that she does not drink alcohol or use drugs.   History reviewed. No pertinent family history.   Allergies  Allergen Reactions  . Ibuprofen Anaphylaxis  . Nsaids Anaphylaxis    Allergic to NSAIDs except for Excedrin     Prior to Admission medications   Medication Sig Start Date End Date Taking? Authorizing Provider  divalproex (DEPAKOTE) 250 MG DR tablet Take 250 mg by mouth 3 (  three) times daily.   Yes [provider]  FLUoxetine (PROZAC) 40 MG capsule Take 1 capsule (40 mg total) by mouth daily. For depression and anxiety. 08/08/11  Yes Mashburn, Marlane Hatcher, PA-C  ziprasidone (GEODON) 80 MG capsule Take one capsule at bedtime for depression and mood stability. 08/08/11  Yes Mashburn, Marlane Hatcher, PA-C  buprenorphine-naloxone (SUBOXONE)  8-2 MG SUBL Place 1 tablet under the tongue daily.    [provider]  clonazePAM (KLONOPIN) 1 MG tablet Take at 8AM, 2pm, and at bedtime for anxiety. 08/08/11   Ruben Im, PA-C  cyclobenzaprine (FLEXERIL) 10 MG tablet Take one tablet every eight hours if needed for muscle spasm. 08/08/11   Ruben Im, PA-C  meloxicam (MOBIC) 7.5 MG tablet Take 7.5 mg by mouth daily. 03/13/18   [provider]  methylphenidate (RITALIN) 20 MG tablet Take 20 mg by mouth 3 (three) times daily.    [provider]  naproxen (NAPROSYN) 500 MG tablet Take 500 mg by mouth 2 (two) times daily. 03/22/18   [provider]    Review of Systems:  Constitutional:  No weight loss, night sweats, Fevers, chills, fatigue.  Head&Eyes: No headache.  No vision loss.  No eye pain or scotoma ENT:  No Difficulty swallowing,Tooth/dental problems,Sore throat,  No ear ache, post nasal drip,  Cardio-vascular:  No chest pain, Orthopnea, PND, swelling in lower extremities,  dizziness, palpitations  GI:  No  abdominal pain, vomiting, diarrhea, loss of appetite, hematochezia, melena, heartburn, indigestion, Resp:   No coughing up of blood .No wheezing.No chest wall deformity  Skin:  no rash or lesions.  GU:  no dysuria, change in color of urine, no urgency or frequency. No flank pain.  Musculoskeletal:  No joint pain or swelling. No decreased range of motion. No back pain.  Psych:  No change in mood or affect. No depression or anxiety. Neurologic: No headache, no dysesthesia, no focal weakness, no vision loss. No syncope  Physical Exam: Vitals:   05/19/18 0921 05/19/18 0930 05/19/18 1036 05/19/18 1140  BP:  (!) 108/44 (!) 129/106 93/76  Pulse:  (!) 109 (!) 118 (!) 109  Resp:  (!) 24 20 (!) 21  Temp: (!) 97.4 F (36.3 C)     TempSrc: Rectal     SpO2:  98% 95% 100%  Weight:      Height:       General:  A&O x 3, NAD, nontoxic, pleasant/cooperative Head/Eye: No conjunctival  hemorrhage, no icterus, [place]/AT, No nystagmus ENT:  No icterus,  No thrush, good dentition, no pharyngeal exudate Neck:  No masses, no lymphadenpathy, no bruits CV:  RRR, no rub, no gallop, no S3 Lung:  Bibasilar crackles, no wheeze Abdomen: soft/NT, +BS, nondistended, no peritoneal signs Ext: No cyanosis, No rashes, No petechiae, No lymphangitis, No edema Neuro: CNII-XII intact, strength 4/5 in bilateral upper and lower extremities, no dysmetria  Labs on Admission:  Basic Metabolic Panel: Recent Labs  Lab 05/19/18 0700 05/19/18 0737  NA 137  --   K 2.6*  --   CL 97*  --   CO2 22  --   GLUCOSE 135*  --   BUN 16  --   CREATININE 1.04*  --   CALCIUM 10.3  --   MG  --  1.9   Liver Function Tests: No results for input(s): AST, ALT, ALKPHOS, BILITOT, PROT, ALBUMIN in the last 168 hours. No results for input(s): LIPASE, AMYLASE in the last 168 hours. No  results for input(s): AMMONIA in the last 168 hours. CBC: Recent Labs  Lab 05/19/18 0700  WBC 21.4*  NEUTROABS 15.6*  HGB 6.0*  HCT 20.3*  MCV 109.1*  PLT 811*   Coagulation Profile: No results for input(s): INR, PROTIME in the last 168 hours. Cardiac Enzymes: No results for input(s): CKTOTAL, CKMB, CKMBINDEX, TROPONINI in the last 168 hours. BNP: Invalid input(s): POCBNP CBG: No results for input(s): GLUCAP in the last 168 hours. Urine analysis:    Component Value Date/Time   COLORURINE YELLOW 05/19/2018 0921   APPEARANCEUR CLOUDY (A) 05/19/2018 0921   LABSPEC 1.008 05/19/2018 0921   PHURINE 6.0 05/19/2018 0921   GLUCOSEU NEGATIVE 05/19/2018 0921   HGBUR SMALL (A) 05/19/2018 0921   BILIRUBINUR NEGATIVE 05/19/2018 0921   KETONESUR NEGATIVE 05/19/2018 0921   PROTEINUR 30 (A) 05/19/2018 0921   NITRITE NEGATIVE 05/19/2018 0921   LEUKOCYTESUR LARGE (A) 05/19/2018 0921   Sepsis Labs: @LABRCNTIP (procalcitonin:4,lacticidven:4) ) Recent Results (from the past 240 hour(s))  Culture, blood (routine x 2)     Status:  None (Preliminary result)   Collection Time: 05/19/18  8:13 AM  Result Value Ref Range Status   Specimen Description   Final    BLOOD LEFT FOREARM BOTTLES DRAWN AEROBIC AND ANAEROBIC   Special Requests   Final    Blood Culture adequate volume Performed at [facility], [address] [place], [place] [address]    Culture PENDING  Incomplete   Report Status PENDING  Incomplete  Culture, blood (routine x 2)     Status: None (Preliminary result)   Collection Time: 05/19/18  9:00 AM  Result Value Ref Range Status   Specimen Description BLOOD RIGHT HAND BOTTLES DRAWN AEROBIC ONLY  Final   Special Requests   Final    Blood Culture results may not be optimal due to an inadequate volume of blood received in culture bottles Performed at [facility], [address], [place], [place] [address]    Culture PENDING  Incomplete   Report Status PENDING  Incomplete     Radiological Exams on Admission: Dg Chest 2 View  Result Date: 05/19/2018 CLINICAL DATA:  Shortness of breath and cough EXAM: CHEST - 2 VIEW COMPARISON:  None. FINDINGS: There is diffuse reticulonodular interstitial disease throughout the lungs bilaterally. No consolidation. Heart size and pulmonary vascularity are normal. No adenopathy. No bone lesions. IMPRESSION: Widespread reticulonodular interstitial disease without consolidation. Question chronicity of this interstitial disease. Differential considerations must include atypical infection, somewhat atypical appearance of edema secondary to congestive heart failure, allergic type phenomenon, and possible underlying noncardiogenic interstitial disease. Short interval follow-up chest radiograph advised in this circumstance; repeat study in 2-3 weeks after appropriate therapy based on clinical symptoms advised. Heart size and pulmonary vascularity are normal. No adenopathy evident. Electronically Signed   By: Lowella Grip III M.D.   On: 05/19/2018 08:21    EKG: Independently  reviewed. Sinus, nonspecific ST changes    Time spent:70 minutes Code Status:   FULL Family Communication:  No Family at bedside Disposition Plan: expect 2-3 day hospitalization Consults called: GI--[place] DVT Prophylaxis: SCDs  Orson Eva, DO  [facility] Pager [phone]  If 7PM-7AM, please contact night-coverage [email or web address] Password [facility] 05/19/2018, 11:58 AM

## 2018-05-19 NOTE — H&P (View-Only) (Signed)
Consultation  Referring Provider:   Orson Eva MD   Primary Care Physician:  Default, Provider, MD Primary Gastroenterologist:        NA Reason for Consultation:     GI bleed         HPI:   Christine Ortega is a 58 y.o. female with chronic pain, history of schizophrenia, seizure disorder, who presented to [facility] with shortness of breath and fevers. She also endorsed melena. We were consulted by Dr. Carles Collet for transfer as Forestine Na has no GI coverage this weekend. She states she has chronic back pain, has been taking ibuprofen - 2 tabs every 4-6 hours as well as BC powder every 6 hours. About 4 days ago she reports she had a "massive black tarry bowel movement", and then had another black bowel movement about 24 hours ago. No bowel movements since that time. She has ongoing shortness of breath and feels "terrible.   In the ED she was tachycardic to 120s and hypotensive with 70/60s BP. She was noted to have Hgb of 6.0. She was given 2 units PRBC as well. BP and HR have since improved. She tested negative for flu. CXR shows widespread interstitial infiltrate concerning for pneumonia. She has a WBC of 21.4 and has been started on ceftriazone, azithromycin, and vancomycin. Her oxygen sats range from high 80s to low 90s on 3 L [place].  She denies any history of GI bleeding. She has never had an EGD or colonoscopy. She does not follow with any GI practice. Her main complaint is chronic back pain and asking what she can be given for that. She denies any abdominal pain or vomiting.   Past Medical History:  Diagnosis Date  . ADD (attention deficit disorder)   . Anxiety   . Bipolar disorder ([place])   . Chronic pain   . Depression   . Narcotic abuse in remission ([place])   . Schizophrenia ([place])   . Seizures ([place])     Past Surgical History:  Procedure Laterality Date  . BACK SURGERY      History reviewed. No pertinent family history.  No CRC  Social History   Tobacco Use  . Smoking  status: Former Smoker    Packs/day: 0.50  . Smokeless tobacco: Never Used  Substance Use Topics  . Alcohol use: No  . Drug use: No    Prior to Admission medications   Medication Sig Start Date End Date Taking? Authorizing Provider  divalproex (DEPAKOTE) 250 MG DR tablet Take 250 mg by mouth 3 (three) times daily.   Yes [provider]  FLUoxetine (PROZAC) 40 MG capsule Take 1 capsule (40 mg total) by mouth daily. For depression and anxiety. 08/08/11  Yes Mashburn, Marlane Hatcher, PA-C  ziprasidone (GEODON) 80 MG capsule Take one capsule at bedtime for depression and mood stability. 08/08/11  Yes Mashburn, Marlane Hatcher, PA-C  buprenorphine-naloxone (SUBOXONE) 8-2 MG SUBL Place 1 tablet under the tongue daily.    [provider]  clonazePAM (KLONOPIN) 1 MG tablet Take at 8AM, 2pm, and at bedtime for anxiety. 08/08/11   Ruben Im, PA-C  cyclobenzaprine (FLEXERIL) 10 MG tablet Take one tablet every eight hours if needed for muscle spasm. 08/08/11   Ruben Im, PA-C  meloxicam (MOBIC) 7.5 MG tablet Take 7.5 mg by mouth daily. 03/13/18   [provider]  methylphenidate (RITALIN) 20 MG tablet Take 20 mg by mouth 3 (three) times daily.  [provider]  naproxen (NAPROSYN) 500 MG tablet Take 500 mg by mouth 2 (two) times daily. 03/22/18   [provider]    Current Facility-Administered Medications  Medication Dose Route Frequency Provider Last Rate Last Dose  . 0.9 % NaCl with KCl 20 mEq/ L  infusion   Intravenous Continuous Tat, David, MD 100 mL/hr at 05/19/18 1518    . cefTRIAXone (ROCEPHIN) 1 g in sodium chloride 0.9 % 100 mL IVPB  1 g Intravenous Q24H Tat, David, MD 200 mL/hr at 05/19/18 1455 1 g at 05/19/18 1455  . pantoprazole (PROTONIX) 80 mg in sodium chloride 0.9 % 250 mL (0.32 mg/mL) infusion  8 mg/hr Intravenous Continuous Tat, David, MD 25 mL/hr at 05/19/18 1431 8 mg/hr at 05/19/18 1431  . [START ON 05/20/2018] vancomycin (VANCOCIN) IVPB 1000  mg/200 mL premix  1,000 mg Intravenous Q24H Daleen Bo, MD        Allergies as of 05/19/2018 - Review Complete 05/19/2018  Allergen Reaction Noted  . Ibuprofen Anaphylaxis 08/05/2011  . Nsaids Anaphylaxis 08/05/2011     Review of Systems:    As per HPI, otherwise negative    Physical Exam:  Vital signs in last 24 hours: Temp:  [97.4 F (36.3 C)-98.2 F (36.8 C)] 98.2 F (36.8 C) (02/15 1451) Pulse Rate:  [104-140] 107 (02/15 1451) Resp:  [16-27] 21 (02/15 1451) BP: (73-129)/(31-106) 95/75 (02/15 1451) SpO2:  [93 %-100 %] 96 % (02/15 1451) Weight:  [63.5 kg] 63.5 kg (02/15 0650) Last BM Date: 05/18/18 General:   Pleasant female, working hard to breath Neck:  Supple Lungs: Respirations even but labored. Some coarse BS B.    Heart:  Mild tachycardia Abdomen:  Soft, nondistended, nontender. No appreciable masses or hepatomegaly.  Rectal:  Not performed.  Msk:  Symmetrical without gross deformities.  Extremities:  Without edema. Neurologic:  Alert and  oriented x4;  grossly normal neurologically. Skin:  Intact without significant lesions or rashes. Psych:  Alert and cooperative. Normal affect.  LAB RESULTS: Recent Labs    05/19/18 0700  WBC 21.4*  HGB 6.0*  HCT 20.3*  PLT 811*   BMET Recent Labs    05/19/18 0700  NA 137  K 2.6*  CL 97*  CO2 22  GLUCOSE 135*  BUN 16  CREATININE 1.04*  CALCIUM 10.3   LFT No results for input(s): PROT, ALBUMIN, AST, ALT, ALKPHOS, BILITOT, BILIDIR, IBILI in the last 72 hours. PT/INR No results for input(s): LABPROT, INR in the last 72 hours.  STUDIES: Dg Chest 2 View  Result Date: 05/19/2018 CLINICAL DATA:  Shortness of breath and cough EXAM: CHEST - 2 VIEW COMPARISON:  None. FINDINGS: There is diffuse reticulonodular interstitial disease throughout the lungs bilaterally. No consolidation. Heart size and pulmonary vascularity are normal. No adenopathy. No bone lesions. IMPRESSION: Widespread reticulonodular interstitial  disease without consolidation. Question chronicity of this interstitial disease. Differential considerations must include atypical infection, somewhat atypical appearance of edema secondary to congestive heart failure, allergic type phenomenon, and possible underlying noncardiogenic interstitial disease. Short interval follow-up chest radiograph advised in this circumstance; repeat study in 2-3 weeks after appropriate therapy based on clinical symptoms advised. Heart size and pulmonary vascularity are normal. No adenopathy evident. Electronically Signed   By: Lowella Grip III M.D.   On: 05/19/2018 08:21        Impression / Plan:  58 y/o female with history of significant NSAID use for chronic pain, presenting with shortness of breath / leukocytosis /  abnormal CXR, and symptoms of melena with severe anemia. She has a severe pneumonia / sepsis, along with an upper GI bleed. Her GI bleeding symptoms have been ongoing for at least 4 days, seems to be a slow bleed right now, her last BM was about 24 hours ago. She is on supplemental oxygen but having some labored respirations. I suspect she likely has NSAID related PUD causing her bleeding and she warrants an endoscopy, however would prefer her respiratory status to be significantly improved prior to sedating her, do not think she would tolerate it very well right now.   Recommend: - continue NPO in case emergent endoscopy is needed - please recheck post transfusion Hgb and trend, transfuse to Hgb > 7 - continue IV protonix - no NSAIDs - await course with treatment of pneumonia. No plans for urgent EGD at this time due to her respiratory status unless she has significant active bleeding. In that situation she would likely need intubation.   Will follow, call with questions.   [place] Cellar, MD [facility] Gastroenterology

## 2018-05-20 ENCOUNTER — Inpatient Hospital Stay (HOSPITAL_COMMUNITY): Payer: Medicare Other

## 2018-05-20 ENCOUNTER — Encounter (HOSPITAL_COMMUNITY): Payer: Self-pay | Admitting: Interventional Radiology

## 2018-05-20 DIAGNOSIS — G9340 Encephalopathy, unspecified: Secondary | ICD-10-CM

## 2018-05-20 DIAGNOSIS — K922 Gastrointestinal hemorrhage, unspecified: Secondary | ICD-10-CM

## 2018-05-20 HISTORY — PX: IR EMBO ART  VEN HEMORR LYMPH EXTRAV  INC GUIDE ROADMAPPING: IMG5450

## 2018-05-20 HISTORY — PX: IR ANGIOGRAM SELECTIVE EACH ADDITIONAL VESSEL: IMG667

## 2018-05-20 HISTORY — PX: IR US GUIDE VASC ACCESS LEFT: IMG2389

## 2018-05-20 HISTORY — PX: IR ANGIOGRAM VISCERAL SELECTIVE: IMG657

## 2018-05-20 LAB — CBC WITH DIFFERENTIAL/PLATELET
Abs Immature Granulocytes: 0.24 10*3/uL — ABNORMAL HIGH (ref 0.00–0.07)
Abs Immature Granulocytes: 0.11 10*3/uL — ABNORMAL HIGH (ref 0.00–0.07)
Abs Immature Granulocytes: 0.13 10*3/uL — ABNORMAL HIGH (ref 0.00–0.07)
Basophils Absolute: 0.1 10*3/uL (ref 0.0–0.1)
Basophils Absolute: 0 10*3/uL (ref 0.0–0.1)
Basophils Absolute: 0.1 10*3/uL (ref 0.0–0.1)
Basophils Relative: 0 %
Basophils Relative: 0 %
Basophils Relative: 0 %
Eosinophils Absolute: 0 10*3/uL (ref 0.0–0.5)
Eosinophils Absolute: 0 10*3/uL (ref 0.0–0.5)
Eosinophils Absolute: 0 10*3/uL (ref 0.0–0.5)
Eosinophils Relative: 0 %
Eosinophils Relative: 0 %
Eosinophils Relative: 0 %
HCT: 45.1 % (ref 36.0–46.0)
HCT: 35.8 % — ABNORMAL LOW (ref 36.0–46.0)
HCT: 37.4 % (ref 36.0–46.0)
Hemoglobin: 14.1 g/dL (ref 12.0–15.0)
Hemoglobin: 11.7 g/dL — ABNORMAL LOW (ref 12.0–15.0)
Hemoglobin: 12.1 g/dL (ref 12.0–15.0)
Immature Granulocytes: 1 %
Immature Granulocytes: 1 %
Immature Granulocytes: 1 %
Lymphocytes Relative: 9 %
Lymphocytes Relative: 19 %
Lymphocytes Relative: 24 %
Lymphs Abs: 2.1 10*3/uL (ref 0.7–4.0)
Lymphs Abs: 3.2 10*3/uL (ref 0.7–4.0)
Lymphs Abs: 4.3 10*3/uL — ABNORMAL HIGH (ref 0.7–4.0)
MCH: 27.8 pg (ref 26.0–34.0)
MCH: 28.3 pg (ref 26.0–34.0)
MCH: 28.2 pg (ref 26.0–34.0)
MCHC: 31.3 g/dL (ref 30.0–36.0)
MCHC: 32.7 g/dL (ref 30.0–36.0)
MCHC: 32.4 g/dL (ref 30.0–36.0)
MCV: 88.8 fL (ref 80.0–100.0)
MCV: 86.5 fL (ref 80.0–100.0)
MCV: 87.2 fL (ref 80.0–100.0)
Monocytes Absolute: 2.7 10*3/uL — ABNORMAL HIGH (ref 0.1–1.0)
Monocytes Absolute: 1.4 10*3/uL — ABNORMAL HIGH (ref 0.1–1.0)
Monocytes Absolute: 1.8 10*3/uL — ABNORMAL HIGH (ref 0.1–1.0)
Monocytes Relative: 11 %
Monocytes Relative: 8 %
Monocytes Relative: 10 %
NEUTROS PCT: 79 %
NEUTROS PCT: 72 %
Neutro Abs: 19.1 10*3/uL — ABNORMAL HIGH (ref 1.7–7.7)
Neutro Abs: 12.3 10*3/uL — ABNORMAL HIGH (ref 1.7–7.7)
Neutro Abs: 11.5 10*3/uL — ABNORMAL HIGH (ref 1.7–7.7)
Neutrophils Relative %: 65 %
Platelets: 288 10*3/uL (ref 150–400)
Platelets: 229 10*3/uL (ref 150–400)
Platelets: 242 10*3/uL (ref 150–400)
RBC: 5.08 MIL/uL (ref 3.87–5.11)
RBC: 4.14 MIL/uL (ref 3.87–5.11)
RBC: 4.29 MIL/uL (ref 3.87–5.11)
RDW: 15.9 % — ABNORMAL HIGH (ref 11.5–15.5)
RDW: 16.7 % — ABNORMAL HIGH (ref 11.5–15.5)
RDW: 17.5 % — AB (ref 11.5–15.5)
WBC: 24.2 10*3/uL — ABNORMAL HIGH (ref 4.0–10.5)
WBC: 17 10*3/uL — ABNORMAL HIGH (ref 4.0–10.5)
WBC: 17.8 10*3/uL — ABNORMAL HIGH (ref 4.0–10.5)
nRBC: 2.4 % — ABNORMAL HIGH (ref 0.0–0.2)
nRBC: 3.3 % — ABNORMAL HIGH (ref 0.0–0.2)
nRBC: 2.1 % — ABNORMAL HIGH (ref 0.0–0.2)

## 2018-05-20 LAB — RESPIRATORY PANEL BY PCR
Adenovirus: NOT DETECTED
BORDETELLA PERTUSSIS-RVPCR: NOT DETECTED
Chlamydophila pneumoniae: NOT DETECTED
Coronavirus 229E: NOT DETECTED
Coronavirus HKU1: NOT DETECTED
Coronavirus NL63: NOT DETECTED
Coronavirus OC43: NOT DETECTED
Influenza A: NOT DETECTED
Influenza B: NOT DETECTED
METAPNEUMOVIRUS-RVPPCR: NOT DETECTED
Mycoplasma pneumoniae: NOT DETECTED
Parainfluenza Virus 1: NOT DETECTED
Parainfluenza Virus 2: NOT DETECTED
Parainfluenza Virus 3: NOT DETECTED
Parainfluenza Virus 4: NOT DETECTED
RHINOVIRUS / ENTEROVIRUS - RVPPCR: NOT DETECTED
Respiratory Syncytial Virus: NOT DETECTED

## 2018-05-20 LAB — POCT I-STAT 7, (LYTES, BLD GAS, ICA,H+H)
Acid-base deficit: 11 mmol/L — ABNORMAL HIGH (ref 0.0–2.0)
Acid-base deficit: 8 mmol/L — ABNORMAL HIGH (ref 0.0–2.0)
Bicarbonate: 18.6 mmol/L — ABNORMAL LOW (ref 20.0–28.0)
Bicarbonate: 18.1 mmol/L — ABNORMAL LOW (ref 20.0–28.0)
Calcium, Ion: 1.07 mmol/L — ABNORMAL LOW (ref 1.15–1.40)
Calcium, Ion: 1.09 mmol/L — ABNORMAL LOW (ref 1.15–1.40)
HCT: 41 % (ref 36.0–46.0)
HCT: 34 % — ABNORMAL LOW (ref 36.0–46.0)
HEMOGLOBIN: 13.9 g/dL (ref 12.0–15.0)
HEMOGLOBIN: 11.6 g/dL — AB (ref 12.0–15.0)
O2 Saturation: 100 %
O2 Saturation: 98 %
PCO2 ART: 41.9 mmHg (ref 32.0–48.0)
PO2 ART: 407 mmHg — AB (ref 83.0–108.0)
Potassium: 4.4 mmol/L (ref 3.5–5.1)
Potassium: 4.3 mmol/L (ref 3.5–5.1)
Sodium: 140 mmol/L (ref 135–145)
Sodium: 146 mmol/L — ABNORMAL HIGH (ref 135–145)
TCO2: 20 mmol/L — ABNORMAL LOW (ref 22–32)
TCO2: 19 mmol/L — ABNORMAL LOW (ref 22–32)
pCO2 arterial: 53.7 mmHg — ABNORMAL HIGH (ref 32.0–48.0)
pH, Arterial: 7.147 — CL (ref 7.350–7.450)
pH, Arterial: 7.258 — ABNORMAL LOW (ref 7.350–7.450)
pO2, Arterial: 129 mmHg — ABNORMAL HIGH (ref 83.0–108.0)

## 2018-05-20 LAB — IRON AND TIBC
IRON: 84 ug/dL (ref 28–170)
Saturation Ratios: 30 % (ref 10.4–31.8)
TIBC: 277 ug/dL (ref 250–450)
UIBC: 193 ug/dL

## 2018-05-20 LAB — GLUCOSE, CAPILLARY: Glucose-Capillary: 159 mg/dL — ABNORMAL HIGH (ref 70–99)

## 2018-05-20 LAB — BASIC METABOLIC PANEL
Anion gap: 8 (ref 5–15)
BUN: 16 mg/dL (ref 6–20)
CO2: 17 mmol/L — ABNORMAL LOW (ref 22–32)
Calcium: 6.6 mg/dL — ABNORMAL LOW (ref 8.9–10.3)
Chloride: 116 mmol/L — ABNORMAL HIGH (ref 98–111)
Creatinine, Ser: 1.08 mg/dL — ABNORMAL HIGH (ref 0.44–1.00)
GFR calc non Af Amer: 57 mL/min — ABNORMAL LOW (ref >60)
Glucose, Bld: 196 mg/dL — ABNORMAL HIGH (ref 70–99)
Potassium: 4.4 mmol/L (ref 3.5–5.1)
Sodium: 141 mmol/L (ref 135–145)

## 2018-05-20 LAB — STREP PNEUMONIAE URINARY ANTIGEN: STREP PNEUMO URINARY ANTIGEN: NEGATIVE

## 2018-05-20 LAB — PHOSPHORUS: Phosphorus: 5 mg/dL — ABNORMAL HIGH (ref 2.5–4.6)

## 2018-05-20 LAB — RAPID URINE DRUG SCREEN, HOSP PERFORMED
Amphetamines: NOT DETECTED
Barbiturates: NOT DETECTED
Benzodiazepines: POSITIVE — AB
COCAINE: NOT DETECTED
Opiates: NOT DETECTED
Tetrahydrocannabinol: POSITIVE — AB

## 2018-05-20 LAB — TRIGLYCERIDES: Triglycerides: 109 mg/dL (ref <150)

## 2018-05-20 LAB — ACETAMINOPHEN LEVEL: Acetaminophen (Tylenol), Serum: <10 ug/mL — ABNORMAL LOW (ref 10–30)

## 2018-05-20 LAB — LACTIC ACID, PLASMA: LACTIC ACID, VENOUS: 1.6 mmol/L (ref 0.5–1.9)

## 2018-05-20 LAB — FERRITIN: Ferritin: 19 ng/mL (ref 11–307)

## 2018-05-20 LAB — SALICYLATE LEVEL: Salicylate Lvl: <7 mg/dL (ref 2.8–30.0)

## 2018-05-20 LAB — MAGNESIUM: Magnesium: 1.5 mg/dL — ABNORMAL LOW (ref 1.7–2.4)

## 2018-05-20 LAB — HIV ANTIBODY (ROUTINE TESTING W REFLEX): HIV Screen 4th Generation wRfx: NONREACTIVE

## 2018-05-20 LAB — VITAMIN B12: Vitamin B-12: 2349 pg/mL — ABNORMAL HIGH (ref 180–914)

## 2018-05-20 MED ORDER — NOREPINEPHRINE 16 MG/250ML-% IV SOLN
0.0000 ug/min | INTRAVENOUS | Status: DC
  Administered 2018-05-20: 20 ug/min via INTRAVENOUS
  Administered 2018-05-20: 40 ug/min via INTRAVENOUS
  Filled 2018-05-20 (×3): qty 250

## 2018-05-20 MED ORDER — FENTANYL CITRATE (PF) 100 MCG/2ML IJ SOLN
50.0000 ug | INTRAMUSCULAR | Status: DC | PRN
  Administered 2018-05-20: 100 ug via INTRAVENOUS

## 2018-05-20 MED ORDER — LIDOCAINE HCL 1 % IJ SOLN
INTRAMUSCULAR | Status: AC
  Filled 2018-05-20: qty 20

## 2018-05-20 MED ORDER — FENTANYL CITRATE (PF) 100 MCG/2ML IJ SOLN
INTRAMUSCULAR | Status: AC | PRN
  Administered 2018-05-20: 50 ug via INTRAVENOUS

## 2018-05-20 MED ORDER — ORAL CARE MOUTH RINSE
15.0000 mL | OROMUCOSAL | Status: DC
  Administered 2018-05-20 – 2018-05-21 (×8): 15 mL via OROMUCOSAL

## 2018-05-20 MED ORDER — HYDROMORPHONE HCL 1 MG/ML IJ SOLN: 1.0000 mg | INTRAMUSCULAR | Status: DC | PRN

## 2018-05-20 MED ORDER — SODIUM BICARBONATE 8.4 % IV SOLN
INTRAVENOUS | Status: DC
  Administered 2018-05-20: 05:00:00 via INTRAVENOUS
  Filled 2018-05-20 (×2): qty 150

## 2018-05-20 MED ORDER — IOPAMIDOL (ISOVUE-300) INJECTION 61%
INTRAVENOUS | Status: AC
  Filled 2018-05-20: qty 200

## 2018-05-20 MED ORDER — MIDAZOLAM HCL 2 MG/2ML IJ SOLN
1.0000 mg | INTRAMUSCULAR | Status: DC | PRN
  Administered 2018-05-20: 2 mg via INTRAVENOUS

## 2018-05-20 MED ORDER — PROPOFOL 1000 MG/100ML IV EMUL
5.0000 ug/kg/min | INTRAVENOUS | Status: DC
  Administered 2018-05-20: 40 ug/kg/min via INTRAVENOUS
  Administered 2018-05-20: 20 ug/kg/min via INTRAVENOUS
  Administered 2018-05-20: 40 ug/kg/min via INTRAVENOUS
  Administered 2018-05-21: 70 ug/kg/min via INTRAVENOUS
  Filled 2018-05-20 (×6): qty 100

## 2018-05-20 MED ORDER — SODIUM BICARBONATE 8.4 % IV SOLN
100.0000 meq | Freq: Once | INTRAVENOUS | Status: AC
  Administered 2018-05-20: 100 meq via INTRAVENOUS
  Filled 2018-05-20: qty 50

## 2018-05-20 MED ORDER — DEXTROSE IN LACTATED RINGERS 5 % IV SOLN
INTRAVENOUS | Status: DC
  Administered 2018-05-20: 900 mL via INTRAVENOUS
  Administered 2018-05-20 – 2018-05-21 (×2): via INTRAVENOUS

## 2018-05-20 MED ORDER — HYDROMORPHONE HCL 1 MG/ML IJ SOLN
1.0000 mg | INTRAMUSCULAR | Status: DC | PRN
  Administered 2018-05-20: 2 mg via INTRAVENOUS

## 2018-05-20 MED ORDER — VALPROATE SODIUM 500 MG/5ML IV SOLN
250.0000 mg | Freq: Two times a day (BID) | INTRAVENOUS | Status: AC
  Administered 2018-05-20 – 2018-05-28 (×17): 250 mg via INTRAVENOUS
  Filled 2018-05-20 (×17): qty 2.5

## 2018-05-20 MED ORDER — SODIUM CHLORIDE 0.9% IV SOLUTION: Freq: Once | INTRAVENOUS | Status: DC

## 2018-05-20 MED ORDER — MIDAZOLAM HCL 2 MG/2ML IJ SOLN
INTRAMUSCULAR | Status: AC
  Filled 2018-05-20: qty 2

## 2018-05-20 MED ORDER — CALCIUM GLUCONATE-NACL 1-0.675 GM/50ML-% IV SOLN
1.0000 g | Freq: Once | INTRAVENOUS | Status: AC
  Administered 2018-05-20: 1000 mg via INTRAVENOUS
  Filled 2018-05-20: qty 50

## 2018-05-20 MED ORDER — ACETAMINOPHEN 160 MG/5ML PO SOLN
650.0000 mg | Freq: Once | ORAL | Status: AC
  Administered 2018-05-20: 650 mg
  Filled 2018-05-20: qty 20.3

## 2018-05-20 MED ORDER — FENTANYL CITRATE (PF) 100 MCG/2ML IJ SOLN
INTRAMUSCULAR | Status: AC
  Filled 2018-05-20: qty 2

## 2018-05-20 MED ORDER — LIDOCAINE HCL (PF) 1 % IJ SOLN
INTRAMUSCULAR | Status: AC | PRN
  Administered 2018-05-20: 5 mL

## 2018-05-20 MED ORDER — DEXMEDETOMIDINE HCL IN NACL 400 MCG/100ML IV SOLN
0.4000 ug/kg/h | INTRAVENOUS | Status: DC
  Administered 2018-05-20: 1.2 ug/kg/h via INTRAVENOUS
  Administered 2018-05-20: 1 ug/kg/h via INTRAVENOUS
  Filled 2018-05-20: qty 200
  Filled 2018-05-20: qty 100

## 2018-05-20 MED ORDER — HYDROMORPHONE HCL 1 MG/ML IJ SOLN
INTRAMUSCULAR | Status: AC
  Administered 2018-05-20: 2 mg via INTRAVENOUS
  Filled 2018-05-20: qty 2

## 2018-05-20 MED ORDER — CHLORHEXIDINE GLUCONATE 0.12% ORAL RINSE (MEDLINE KIT)
15.0000 mL | Freq: Two times a day (BID) | OROMUCOSAL | Status: DC
  Administered 2018-05-20 – 2018-05-21 (×2): 15 mL via OROMUCOSAL

## 2018-05-20 MED ORDER — MAGNESIUM SULFATE 4 GM/100ML IV SOLN
4.0000 g | Freq: Once | INTRAVENOUS | Status: AC
  Administered 2018-05-20: 4 g via INTRAVENOUS
  Filled 2018-05-20: qty 100

## 2018-05-20 NOTE — Progress Notes (Signed)
ABG critical values called to Dr. Jimmy Footman at 5844 on 05/20/2018.  Orders given to titrate oxygen and increase respiratory rate to 26.

## 2018-05-20 NOTE — Progress Notes (Signed)
Vented pt transported at 0048 to IR with unit RT, charge RN, bedside RN and pt's family without incident. Bedside RN and unit RT remained with the pt throughout the entire procedure.  Pt transported back to unit room (3M02) at 1594 without complications.

## 2018-05-20 NOTE — Procedures (Signed)
Pre-procedure Diagnosis: Upper GI bleed from gastric ulcer Post-procedure Diagnosis: Same  Post mesenteric arteriogram and empiric embolization of the GDA.    Complications: None Immediate  EBL: None Keep left leg straight while vascular sheath remains in place.    Signed: Sandi Mariscal Pager: [phone] 05/20/2018, 2:03 AM

## 2018-05-20 NOTE — Progress Notes (Signed)
Progress Note   Subjective  Patient had massive bleeding overnight - passed bright red blood per rectum. Hgb went from 6 to 4.1 after 2 units of PRBC. Transferred to ICU for intubation and emergent endoscopy. She had significant hypotension and tachycardia. EGD was limited by significant blood obscuring views but a very large ulcer in the antrum / duodenal bulb with adherent clot and active bleeding which did not seem amenable to endoscopic therapy. She was taken to IR for embolization of the GDA. Hgb has improved this AM, on pressors and bicarb drip for acidosis.    Objective   Vital signs in last 24 hours: Temp:  [97.4 F (36.3 C)-102 F (38.9 C)] 102 F (38.9 C) (02/16 0815) Pulse Rate:  [76-155] 117 (02/16 0815) Resp:  [11-31] 23 (02/16 0815) BP: (57-183)/(27-164) 123/110 (02/16 0800) SpO2:  [94 %-100 %] 100 % (02/16 0815) Arterial Line BP: (78-151)/(41-79) 132/67 (02/16 0815) FiO2 (%):  [40 %-100 %] 40 % (02/16 0306) Last BM Date: 05/18/18 General:    white female intubated and sedated Heart:  Regular, tachycardic Lungs: ventilated, some coarse BS B Abdomen:  Soft, nondistended.  Extremities:  (+) LE edema.   Intake/Output from previous day: 02/15 0701 - 02/16 0700 In: 7911.1 [I.V.:2323.5; Blood:1308; IV Piggyback:4079.5] Out: 600 [Urine:600] Intake/Output this shift: Total I/O In: 420.2 [I.V.:420.2] Out: 75 [Urine:75]  Lab Results: Recent Labs    05/19/18 1839 05/19/18 2039 05/20/18 0233 05/20/18 0256  WBC 15.3* 14.5* 24.2*  --   HGB 4.8* 4.1* 14.1 13.9  HCT 16.2* 13.6* 45.1 41.0  PLT 479* 436* 288  --    BMET Recent Labs    05/19/18 0700 05/19/18 1839 05/20/18 0233 05/20/18 0256  NA 137 141 141 140  K 2.6* 4.3 4.4 4.4  CL 97* 113* 116*  --   CO2 22 21* 17*  --   GLUCOSE 135* 86 196*  --   BUN 16 14 16   --   CREATININE 1.04* 0.80 1.08*  --   CALCIUM 10.3 7.5* 6.6*  --    LFT Recent Labs    05/19/18 1839  PROT 5.0*  ALBUMIN 1.9*  AST  33  ALT 12  ALKPHOS 79  BILITOT 0.2*   PT/INR Recent Labs    05/19/18 1839  LABPROT 15.4*  INR 1.23    Studies/Results: Dg Chest 2 View  Result Date: 05/19/2018 CLINICAL DATA:  Shortness of breath and cough EXAM: CHEST - 2 VIEW COMPARISON:  None. FINDINGS: There is diffuse reticulonodular interstitial disease throughout the lungs bilaterally. No consolidation. Heart size and pulmonary vascularity are normal. No adenopathy. No bone lesions. IMPRESSION: Widespread reticulonodular interstitial disease without consolidation. Question chronicity of this interstitial disease. Differential considerations must include atypical infection, somewhat atypical appearance of edema secondary to congestive heart failure, allergic type phenomenon, and possible underlying noncardiogenic interstitial disease. Short interval follow-up chest radiograph advised in this circumstance; repeat study in 2-3 weeks after appropriate therapy based on clinical symptoms advised. Heart size and pulmonary vascularity are normal. No adenopathy evident. Electronically Signed   By: Lowella Grip III M.D.   On: 05/19/2018 08:21   Dg Chest Port 1 View  Result Date: 05/19/2018 CLINICAL DATA:  Respiratory failure EXAM: PORTABLE CHEST 1 VIEW COMPARISON:  Chest x-ray from earlier same day. FINDINGS: Endotracheal tube has been placed with tip well-positioned at approximately 3 cm above the carina. Heart size and mediastinal contours are stable in the short-term interval. The bilateral reticulonodular opacities  are unchanged in the short-term interval. No pleural effusion or pneumothorax seen. IMPRESSION: 1. Endotracheal tube well positioned with tip approximately 3 cm above the carina. 2. Otherwise stable exam. Electronically Signed   By: Franki Cabot M.D.   On: 05/19/2018 23:29   Dg Abd Portable 1v  Result Date: 05/19/2018 CLINICAL DATA:  Bowel perforation. EXAM: PORTABLE ABDOMEN - 1 VIEW COMPARISON:  None. FINDINGS: Stomach and  small bowel are distended with air. No evidence of small bowel obstruction. No convincing evidence of free intraperitoneal air. IMPRESSION: No convincing evidence of bowel perforation on this semi-erect plain film examination of the abdomen. If clinical concern for bowel perforation persists, would consider additional decubitus view. Electronically Signed   By: Franki Cabot M.D.   On: 05/19/2018 23:31       Assessment / Plan:    58 y/o female admitted with pneumonia and upper GI bleed. She had endorsed a few days of melena upon admission. Overnight developed bright red blood per rectum, her Hgb went from 6.0 to 4.1 after receiving 2 units of PRBC. She became hypotensive and tachycardic, transferred to ICU for emergent intubation, aggressive resuscitation, and emergent endoscopy over which time she received 5 units of PRBC. EGD showed significant blood / clot burden in the stomach with poor views due to this. Eventually a giant ulcer was able to be visualized in the antrum / bulb with adherent clot and active bleeding. Due to the size of the lesion and poor visualization, I did not think this was amenable to endoscopic therapy, total time with endoscopy was a few minutes. Xray did not show any obvious evidence of perforation. In light of her tenuous status with intermittent hypotension on pressors, decision was made to proceed with IR embolization after discussion with Dr. Francena Hanly. At IR there was suprisingly no extravasation noted in the stomach, but GDA was empirically embolized.    Overall her Hgb has improved more than expected given her course last night, no significant bleeding noted per nursing staff after IR procedure, she has been hemodynamically more stable but is acidotic. Current pressor requirement for hypotension could be due to pneumonia / sepsis as well.   Continue IV PPI and supportive care at this time. Would consider CT abdomen / pelvis if she fails to improve hemodynamics / acidosis.    Will follow, call with questions.  [place] Cellar, MD [facility] Gastroenterology

## 2018-05-20 NOTE — Op Note (Signed)
[facility] Patient Name: Christine Ortega Procedure Date : 05/19/2018 MRN: [id number] Attending MD: Carlota Raspberry. Havery Moros , MD Date of Birth: Oct 04, 1960 CSN: [id number] Age: 58 Admit Type: Inpatient Procedure:                Upper GI endoscopy Indications:              massive upper gastrointestinal bleeding, history of                            NSAID use Providers:                Carlota Raspberry. Havery Moros, MD, Cleda Daub, RN,                            Laverda Sorenson, Technician Referring MD:              Medicines:                sedation per ICU staff Complications:            ongoing active bleeding Estimated Blood Loss:     active bleeding from ulcer Procedure:                Pre-Anesthesia Assessment:                           - Prior to the procedure, a History and Physical                            was performed, and patient medications and                            allergies were reviewed. The patient's tolerance of                            previous anesthesia was also reviewed. The risks                            and benefits of the procedure and the sedation                            options and risks were discussed with the patient.                            All questions were answered, and informed consent                            was obtained. Prior Anticoagulants: The patient has                            taken no previous anticoagulant or antiplatelet                            agents. ASA Grade Assessment: IV - A patient with  severe systemic disease that is a constant threat                            to life. After reviewing the risks and benefits,                            the patient was deemed in satisfactory condition to                            undergo the procedure.                           After obtaining informed consent, the endoscope was                            passed under direct vision. Throughout the                             procedure, the patient's blood pressure, pulse, and                            oxygen saturations were monitored continuously. The                            [id number]-[id number] ([id number]) Olympus double channel                            gastroscope was introduced through the mouth, and                            advanced to the second part of duodenum. The upper                            GI endoscopy was technically difficult and complex                            due to excessive bleeding. The patient tolerated                            the procedure. Scope In: Scope Out: Findings:      The examined esophagus appeared normal.      Red blood and significant clot burden was found in the entire examined       stomach. This was not cleared due to reasons outlined below.      On initial examination one suspected giant gastric ulcer was found in       the distal gastric antrum entering the bulb. There was a large amount of       adherent clot with red blood in the area, noted at the center of the       base of the ulcer however visualization of the entire ulcer base was       quite difficult, and also could not clear identify a focal vessel or       bleeding point. The patient had worsening hypotension and tachycardia  after only a few minutes of endoscopy and the procedure was aborted so       the critical care team could place an additional line for venous access       and arterial line. During this time an abdominal xray was also obtained       to ensure no perforation from insufflation of the stomach given the size       of the ulcer and hypotension. I reviewed the xray with the on call       radiologist who did not see any evidence of perforation or free air.       Once the critical care team had additional venous access and additional       blood was transfused with stablization of the blood pressure, the       endoscope was placed again to see if this lesion  could be better       visualized and if it was amenable to endoscopic therapy. Additional       lavage was performed of the area for a very short period of time with       better views obtained, and there was no focal area to easily apply any       endoscopic therapy. Given the patient's tenous status the procedure was       quickly aborted again and interventional radiology was contacted.      Red blood was found in the entire duodenum without other apparent       pathology however visualization was poor. Impression:               - Normal esophagus.                           - Red blood in the entire stomach with significant                            clot burden - as above time not taken to clear the                            entire stomach due to obvious pathology at the                            antrum. It is possible there is pathology in the                            fundus / proximal stomach to cause bleeding but                            would not have been visualized on this exam.                           - Giant gastric ulcer at the antrum entering                            duodenal bulb - not amenable to endoscopic therapy                            given appearance and poor  ability to visualize the                            area (of note, hemospray currently not available                            due to FDA recall). The procedure was aborted due                            to tenous status with need for more definitive                            therapy                           - Blood in the entire examined duodenum.                           This patient is having life threatening bleeding                            presumed due to giant gastric ulcer which did not                            appear amenable to endoscopic therapy. IR was                            called for attempt at embolization. Recommendation:           - Remain in ICU for ongoing care.                            - Remain intubated                           - NPO.                           - Continue IV protonix                           - IR for embolization attempt. If that is not                            successful and patient continues to bleed, then                            surgical consultation.                           - We will follow, call with questions. I discussed                            the findings and course with the patient's sister  and daughter at length. Procedure Code(s):        --- Professional ---                           [id number], Esophagogastroduodenoscopy, flexible,                            transoral; diagnostic, including collection of                            specimen(s) by brushing or washing, when performed                            (separate procedure) Diagnosis Code(s):        --- Professional ---                           K92.2, Gastrointestinal hemorrhage, unspecified                           K25.9, Gastric ulcer, unspecified as acute or                            chronic, without hemorrhage or perforation CPT copyright 2018 [employer]. All rights reserved. The codes documented in this report are preliminary and upon coder review may  be revised to meet current compliance requirements. Remo Lipps P. Armbruster, MD 05/20/2018 12:42:49 AM This report has been signed electronically. Number of Addenda: 0

## 2018-05-20 NOTE — Progress Notes (Signed)
Supervising Physician: Sandi Mariscal  Patient Status: [facility] - In-pt  Subjective: S/p visceral angio for GI bleed with successful embo of GDA earlier this am. Pt remains in ICU sedated on vent. No family at bedside  Objective: Physical Exam: BP (!) 123/110 (BP Location: Right Arm)   Pulse (!) 117   Temp (!) 102 F (38.9 C)   Resp (!) 23   Ht '5\' 3"'  (1.6 m)   Wt 63.5 kg   SpO2 100%   BMI 24.80 kg/m  Intubated/sedated (R)groin soft, NT, no hematoma, sheath intact.   Current Facility-Administered Medications:  .  0.9 %  sodium chloride infusion (Manually program via Guardrails IV Fluids), , Intravenous, Once, Domenic Polite, MD .  0.9 %  sodium chloride infusion (Manually program via Guardrails IV Fluids), , Intravenous, Once, Minor, Grace Bushy, NP .  0.9 %  sodium chloride infusion (Manually program via Guardrails IV Fluids), , Intravenous, Once, Minor, Grace Bushy, NP .  0.9 %  sodium chloride infusion (Manually program via Guardrails IV Fluids), , Intravenous, Once, Minor, Grace Bushy, NP .  azithromycin (ZITHROMAX) 500 mg in sodium chloride 0.9 % 250 mL IVPB, 500 mg, Intravenous, Q24H, Tat, David, MD .  benzonatate (TESSALON) capsule 100 mg, 100 mg, Oral, TID, Lavina Hamman, MD, 100 mg at 05/19/18 1802 .  calcium gluconate 1 g/ 50 mL sodium chloride IVPB, 1 g, Intravenous, Once, Brand Males, MD, Last Rate: 50 mL/hr at 05/20/18 0933, 1,000 mg at 05/20/18 0933 .  cefTRIAXone (ROCEPHIN) 2 g in sodium chloride 0.9 % 100 mL IVPB, 2 g, Intravenous, Q24H, Tat, David, MD, Last Rate: 200 mL/hr at 05/20/18 0834, 2 g at 05/20/18 0834 .  cyclobenzaprine (FLEXERIL) tablet 5 mg, 5 mg, Oral, TID PRN, Lavina Hamman, MD .  dextromethorphan-guaiFENesin [facility] DM) 30-600 MG per 12 hr tablet 1 tablet, 1 tablet, Oral, BID, Berle Mull M, MD .  dextrose 5 % in lactated ringers infusion, , Intravenous, Continuous, Ramaswamy, Murali, MD, Last Rate: 50 mL/hr at 05/20/18 0930 .  divalproex  (DEPAKOTE ER) 24 hr tablet 500 mg, 500 mg, Oral, QHS, Patel, Josetta Huddle, MD .  docusate (COLACE) 50 MG/5ML liquid 100 mg, 100 mg, Per Tube, BID PRN, Minor, Grace Bushy, NP .  fentaNYL (SUBLIMAZE) 100 MCG/2ML injection, , , ,  .  fentaNYL (SUBLIMAZE) injection 50-200 mcg, 50-200 mcg, Intravenous, Q2H PRN, Brand Males, MD, 100 mcg at 05/20/18 0927 .  FLUoxetine (PROZAC) capsule 40 mg, 40 mg, Oral, Daily, Lavina Hamman, MD, 40 mg at 05/19/18 1832 .  HYDROcodone-homatropine (HYCODAN) 5-1.5 MG/5ML syrup 5 mL, 5 mL, Oral, Q4H PRN, Lavina Hamman, MD .  iopamidol (ISOVUE-300) 61 % injection, , , ,  .  lidocaine (XYLOCAINE) 1 % (with pres) injection, , , ,  .  magnesium sulfate IVPB 4 g 100 mL, 4 g, Intravenous, Once, Brand Males, MD, Last Rate: 50 mL/hr at 05/20/18 0922, 4 g at 05/20/18 0922 .  midazolam (VERSED) injection 1-4 mg, 1-4 mg, Intravenous, Q2H PRN, Brand Males, MD, 2 mg at 05/20/18 0926 .  norepinephrine (LEVOPHED) 16 mg in 214m premix infusion, 0-40 mcg/min, Intravenous, Titrated, Tat, David, MD, Last Rate: 18.75 mL/hr at 05/20/18 0923, 20 mcg/min at 05/20/18 0923 .  pantoprazole (PROTONIX) 80 mg in sodium chloride 0.9 % 250 mL (0.32 mg/mL) infusion, 8 mg/hr, Intravenous, Continuous, Tat, David, MD, Stopped at 05/19/18 2143 .  propofol (DIPRIVAN) 1000 MG/100ML infusion, 5-80 mcg/kg/min, Intravenous, Titrated, RBrand Males MD .  ziprasidone (GEODON) capsule 80 mg, 80 mg, Oral, QHS, Lavina Hamman, MD  Labs: CBC Recent Labs    05/19/18 2039 05/20/18 0233 05/20/18 0256  WBC 14.5* 24.2*  --   HGB 4.1* 14.1 13.9  HCT 13.6* 45.1 41.0  PLT 436* 288  --    BMET Recent Labs    05/19/18 1839 05/20/18 0233 05/20/18 0256  NA 141 141 140  K 4.3 4.4 4.4  CL 113* 116*  --   CO2 21* 17*  --   GLUCOSE 86 196*  --   BUN 14 16  --   CREATININE 0.80 1.08*  --   CALCIUM 7.5* 6.6*  --    LFT Recent Labs    05/19/18 1839  PROT 5.0*  ALBUMIN 1.9*  AST 33  ALT  12  ALKPHOS 79  BILITOT 0.2*   PT/INR Recent Labs    05/19/18 1839  LABPROT 15.4*  INR 1.23     Studies/Results:  [place] Guide Roadmapping  Result Date: 05/20/2018 INDICATION: Acute life-threatening upper GI bleed secondary to gastric/duodenal ulcer. EXAM: 1. ULTRASOUND GUIDANCE FOR ARTERIAL ACCESS 2. SELECTIVE SUPERIOR MESENTERIC ARTERIOGRAM 3. SELECTIVE CELIAC ARTERIOGRAM 4. SELECTIVE COMMON HEPATIC ARTERIOGRAM 5. SELECTIVE GASTRODUODENAL ARTERIOGRAM AND PERCUTANEOUS COIL EMBOLIZATION MEDICATIONS: None ANESTHESIA/SEDATION: Moderate (conscious) sedation was employed during this procedure. A total of Fentanyl 50 mcg was administered intravenously. Moderate Sedation Time: 30 minutes. The patient's level of consciousness and vital signs were monitored continuously by radiology nursing throughout the procedure under my direct supervision. CONTRAST:  50 cc Isovue-300 FLUOROSCOPY TIME:  6 minutes, 6 seconds (735 mGy) COMPLICATIONS: None immediate. PROCEDURE: Informed consent was obtained from the patient's family following explanation of the procedure, risks, benefits and alternatives. The patient understands, agrees and consents for the procedure. All questions were addressed. A time out was performed prior to the initiation of the procedure. Maximal barrier sterile technique utilized including caps, mask, sterile gowns, sterile gloves, large sterile drape, hand hygiene, and Betadine prep. Attempts were made by the ICU staff to obtain a right femoral arterial line however this ultimately proved unsuccessful. Given the failed attempts, the decision was made to access the left common femoral artery. The left femoral head was marked fluoroscopically. Under ultrasound guidance, the left common femoral artery was accessed with a micropuncture kit after the overlying soft tissues were anesthetized with 1% lidocaine. An ultrasound image was saved for documentation purposes.  The micropuncture sheath was exchanged for a 5 [place] vascular sheath over a Bentson wire. A closure arteriogram was performed through the side of the sheath confirming access within the right common femoral artery. Over a Bentson wire, a Mickelson catheter was advanced to the level of the thoracic aorta where it was back bled and flushed. The catheter was then utilized to select the celiac artery and a selective celiac arteriogram was performed The Mickelson catheter was then utilized to select the superior mesenteric artery and a selective superior mesenteric arteriogram was performed. Again, the Mickelson catheter was utilized to select the celiac artery and a selective celiac arteriogram was performed. Next, with the use of a fathom 14 wire, a regular Renegade microcatheter was advanced into the gastroduodenal artery and a sub selective gastroduodenal arteriogram was performed. A GDA was then coil embolized from its distal aspect to near its origin with multiple overlapping 2, 3 and 4 mm diameter interlock coils. The microcatheter was retracted into the common hepatic artery and a selective common  hepatic arteriogram was performed. Next, the microcatheter was removed and a repeat celiac arteriogram was performed via the [facility] catheter. Finally the [facility] catheter was utilized to select the superior mesenteric artery and a selective superior mesenteric arteriogram was performed. Images were reviewed and the procedure was terminated. All wires and catheters removed from the patient. The left common femoral artery vascular sheath was sutured in place for continued arterial monitoring as per the ICU staff. Dressings were placed. FINDINGS: Celiac arteriogram demonstrates conventional branching pattern without definitive area of vessel irregularity or active extravasation. Selective gastroduodenal arteriogram demonstrates hyperemia at the expected location of the duodenal bulb without discrete area active  extravasation or vessel irregularity. Following empiric percutaneous coil embolization, there is complete embolization of the gastroduodenal artery. Superior mesenteric arteriogram was negative for definitive retrograde contribution to the gastroduodenal arterial tree. IMPRESSION: Technically successful inferior coil embolization of the gastroduodenal artery for life-threatening upper GI bleed due to gastric/duodenal ulcer. Electronically Signed   By: Sandi Mariscal M.D.   On: 05/20/2018 09:18    Assessment/Plan: S/p successful inferior coil embolization of the gastroduodenal artery for life-threatening upper GI bleed due to gastric/duodenal ulcer. Last Hgb up to 14.1 after numerous blood products. Repeat labs pending. IR following along.     LOS: 1 day   I spent a total of 158 minutes in face to face in clinical consultation, greater than 50% of which was counseling/coordinating care for GDA Karilyn Cota Orpha Dain PA-C 05/20/2018 10:08 AM

## 2018-05-20 NOTE — Procedures (Signed)
Central Venous Catheter Insertion Procedure Note Christine Ortega [id number] 1960-08-03  Procedure: Insertion of Central Venous Catheter Indications: Drug and/or fluid administration  Procedure Details Consent: Unable to obtain consent because of emergent medical necessity. Time Out: Verified patient identification, verified procedure, site/side was marked, verified correct patient position, special equipment/implants available, medications/allergies/relevent history reviewed, required imaging and test results available.  Performed  Maximum sterile technique was used including antiseptics, cap, gloves, gown, hand hygiene, mask and sheet. Skin prep: Chlorhexidine; local anesthetic administered A antimicrobial bonded/coated single lumen catheter was placed in the right femoral vein due to emergent situation using the Seldinger technique. Ultrasound guidance used.Yes.   Catheter placed to 16 cm. Blood aspirated via all 3 ports and then flushed x 3. Line sutured x 2 and dressing applied.  Evaluation Blood flow good Complications: No apparent complications Patient did tolerate procedure well. Chest X-ray ordered to verify placement.  CXR: not needed on femoral line Cordis placed urgently due to GIB and hypotension.     Richardson Landry Abrina Petz ACNP Maryanna Shape PCCM Pager [phone] till 1 pm If no answer page [phone] 05/20/2018, 12:12 AM

## 2018-05-20 NOTE — Consult Note (Signed)
NAME:  Christine Ortega, MRN:  [id number], DOB:  Oct 08, 1960, LOS: 1 ADMISSION DATE:  05/19/2018, CONSULTATION DATE: 2 15 2020 REFERRING MD: GI services, CHIEF COMPLAINT: GI bleed, pneumonia, blood loss anemia shock  Brief History   58 year old with GI bleed to be intubated for endoscopy  brief   58 year old female who was transferred from [facility] 05/19/2018 after developing GI bleed blood loss anemia.  While at [facility] she became hypotensive noted to be pale and was transferred to intensive care unit with intention of intubation for planned endoscopy plus or minus pressor support.  Been adequately resuscitated with blood products and fluids.  Blood pressure is responding to this current interventions.  We are intubating her at the crest of GI to the fact she has most likely a community-acquired pneumonia and due to her weakened state probably not tolerated endoscopy without developing respiratory failure.  He is ex-smoker having quit 1 month ago.  Chest x-ray is consistent with community-acquired pneumonia.  Past Medical History  Attention deficit disorder Schizophrenia Bipolar History of chronic opioid abuse History of tobacco abuse Overuse of NSAIDs   Significant Hospital Events   05/19/2018 due to excessive GI bleeding transferred to intensive care unit with plan for endoscopy. 2/15 - Flu PCR - neg  Consults:  05/19/2018 pulmonary critical care 05/19/2018 GI and IR  Procedures:  05/19/2018 intubation  Significant Diagnostic Tests:  05/19/2018 endoscopy>>  - Normal esophagus. - Red blood in the entire stomach with significant clot burden - as above time not taken to clear the entire stomach due to obvious pathology at the antrum. It is possible there is pathology in the fundus / proximal stomach to cause bleeding but would not have been visualized on this exam. - Giant gastric ulcer at the antrum entering duodenal bulb - not amenable to endoscopic therapy given  appearance and poor ability to visualize the area (of note, hemospray currently not available due to FDA recall). The procedure was aborted due to tenous status with need for more definitive therapy - Blood in the entire examined duodenum. This patient is having life threatening bleeding presumed due to giant gastric ulcer which did not appear amenable to endoscopic therapy. IR was called for attempt at embolization.   2./16 - Post mesenteric arteriogram and empiric embolization of the GDA.     Micro Data:  05/19/2018 blood cultures x2 05/19/2018 urine culture 05/19/2018 sputum culture 2/15 - flu pcr - neg  Antimicrobials:  05/19/2018 ceftriaxone 05/19/2018 Zithromax  Interim history/subjective:    2/16 - Per RN : possibly etoh at home. Also abuse of "goody powder" at home for chronic pain after pain clinic turned her down. Currently status- Post mesenteric arteriogram and empiric embolization of the GDA after  Having failed endoscopy.   Significant agitation on precedex gtt  + fent prn + versed prn-> hypertensive -> turned pressors off -> when sedated -> hypotensive -> needing pressors  S/p 5 units PRBC  Radiology concerned about ILD on CXR   Objective   Blood pressure (!) 123/110, pulse (!) 117, temperature (!) 102 F (38.9 C), resp. rate (!) 23, height 5\' 3"  (1.6 m), weight 63.5 kg, SpO2 100 %.    Vent Mode: PRVC FiO2 (%):  [40 %-100 %] 40 % Set Rate:  [14 bmp-26 bmp] 26 bmp Vt Set:  [480 mL] 480 mL PEEP:  [5 cmH20] 5 cmH20 Plateau Pressure:  [24 cmH20-25 cmH20] 24 cmH20   Intake/Output Summary (Last 24 hours) at 05/20/2018  0843 Last data filed at 05/20/2018 0800 Gross per 24 hour  Intake 8331.2 ml  Output 675 ml  Net 7656.2 ml   Filed Weights   05/19/18 0650  Weight: 63.5 kg   General Appearance:  Looks criticall ill + Head:  Normocephalic, without obvious abnormality, atraumatic Eyes:  PERRL - yes, conjunctiva/corneas - muddy ? jaundiced     Ears:  Normal  external ear canals, both ears Nose:  G tube - no Throat:  ETT TUBE - yes , OG tube - ys Neck:  Supple,  No enlargement/tenderness/nodules Lungs: Clear to auscultation bilaterally, Ventilator   Synchrony - NO. Very dysnchronous due to agitation Heart:  S1 and S2 normal, no murmur, CVP - no.  Pressors - levophed  Abdomen:  Soft, no masses, no organomegaly Genitalia / Rectal:  Not done Extremities:  Extremities- intact Skin:  ntact in exposed areas . Sacral area - not examined Neurologic:  Sedation - precedex gtt  + fent prn pversed prn -> RASS - +3. Moves all 4s - yes. CAM-ICU - positive delirium . Orientation - not       LABS    PULMONARY Recent Labs  Lab 05/20/18 0256  PHART 7.147*  PCO2ART 53.7*  PO2ART 407.0*  HCO3 18.6*  TCO2 20*  O2SAT 100.0    CBC Recent Labs  Lab 05/19/18 1839 05/19/18 2039 05/20/18 0233 05/20/18 0256  HGB 4.8* 4.1* 14.1 13.9  HCT 16.2* 13.6* 45.1 41.0  WBC 15.3* 14.5* 24.2*  --   PLT 479* 436* 288  --     COAGULATION Recent Labs  Lab 05/19/18 1839  INR 1.23    CARDIAC  No results for input(s): TROPONINI in the last 168 hours. No results for input(s): PROBNP in the last 168 hours.   CHEMISTRY Recent Labs  Lab 05/19/18 0700 05/19/18 0737 05/19/18 1839 05/20/18 0233 05/20/18 0256  NA 137  --  141 141 140  K 2.6*  --  4.3 4.4 4.4  CL 97*  --  113* 116*  --   CO2 22  --  21* 17*  --   GLUCOSE 135*  --  86 196*  --   BUN 16  --  14 16  --   CREATININE 1.04*  --  0.80 1.08*  --   CALCIUM 10.3  --  7.5* 6.6*  --   MG  --  1.9 1.5* 1.5*  --   PHOS  --   --   --  5.0*  --    Estimated Creatinine Clearance: 51.5 mL/min (A) (by C-G formula based on SCr of 1.08 mg/dL (H)).   LIVER Recent Labs  Lab 05/19/18 1839  AST 33  ALT 12  ALKPHOS 79  BILITOT 0.2*  PROT 5.0*  ALBUMIN 1.9*  INR 1.23     INFECTIOUS Recent Labs  Lab 05/19/18 0920 05/19/18 1839 05/20/18 0233  LATICACIDVEN 4.4* 1.6 1.6  PROCALCITON  --   0.68  --      ENDOCRINE CBG (last 3)  Recent Labs    05/20/18 0731  GLUCAP 159*         IMAGING x48h  - image(s) personally visualized  -   highlighted in bold Dg Chest 2 View  Result Date: 05/19/2018 CLINICAL DATA:  Shortness of breath and cough EXAM: CHEST - 2 VIEW COMPARISON:  None. FINDINGS: There is diffuse reticulonodular interstitial disease throughout the lungs bilaterally. No consolidation. Heart size and pulmonary vascularity are normal. No adenopathy. No bone lesions. IMPRESSION:  Widespread reticulonodular interstitial disease without consolidation. Question chronicity of this interstitial disease. Differential considerations must include atypical infection, somewhat atypical appearance of edema secondary to congestive heart failure, allergic type phenomenon, and possible underlying noncardiogenic interstitial disease. Short interval follow-up chest radiograph advised in this circumstance; repeat study in 2-3 weeks after appropriate therapy based on clinical symptoms advised. Heart size and pulmonary vascularity are normal. No adenopathy evident. Electronically Signed   By: Lowella Grip III M.D.   On: 05/19/2018 08:21   Dg Chest Port 1 View  Result Date: 05/19/2018 CLINICAL DATA:  Respiratory failure EXAM: PORTABLE CHEST 1 VIEW COMPARISON:  Chest x-ray from earlier same day. FINDINGS: Endotracheal tube has been placed with tip well-positioned at approximately 3 cm above the carina. Heart size and mediastinal contours are stable in the short-term interval. The bilateral reticulonodular opacities are unchanged in the short-term interval. No pleural effusion or pneumothorax seen. IMPRESSION: 1. Endotracheal tube well positioned with tip approximately 3 cm above the carina. 2. Otherwise stable exam. Electronically Signed   By: Franki Cabot M.D.   On: 05/19/2018 23:29   Dg Abd Portable 1v  Result Date: 05/19/2018 CLINICAL DATA:  Bowel perforation. EXAM: PORTABLE ABDOMEN - 1  VIEW COMPARISON:  None. FINDINGS: Stomach and small bowel are distended with air. No evidence of small bowel obstruction. No convincing evidence of free intraperitoneal air. IMPRESSION: No convincing evidence of bowel perforation on this semi-erect plain film examination of the abdomen. If clinical concern for bowel perforation persists, would consider additional decubitus view. Electronically Signed   By: Franki Cabot M.D.   On: 05/19/2018 23:31     Resolved Hospital Problem list     Assessment & Plan:  Shock secondary to blood loss anemia with hemoglobin noted be as low as 4.1.  In the setting of a GI bleed.  With multiple melena stools.  He has a history of use of NSAIDs for chronic pain. S/p 5 U PROBC and 2/16 - empiric embolization of GDA   2/16 - no active bleeding but needing levophed post hemorrhage reset shock syndrome  Plan  - levophed for MAP > 65 -Cbc 1pm and 7pm 05/20/2018   -  PRBC if hgb  < 7gm% or for volume resus - Protonix infusion for 72h from 05/19/2018 (currently not on it and RN infiormed)    Vent dependent respiratory failure secondary to blood loss anemia, shock and need to perform endoscopy. Suspected pneumonia v aspiration of blood v ILD (doubt ILD)   05/20/2018 - > does not meet criteria for SBT/Extubation in setting of Acute Respiratory Failure due to agitation  PLAN PRVC VAP bundle BD (hx of smoking) Need followup cxr to resolution   History attention deficit disorder, bipolar, depression, schizophrenia chronic pain   - continue home geodon, depakote, flexeril - but dc home melatonin - monitor QTc  Agitated delirium on vent  - change precedex to diprivan  - fent prn - versed prn   Electrolyte Imbalance - has low mag and calcium  - replete; recheck calcium   At Risk QTc prolongation - normal on EKG 2/15/20120 - get EKG 05/20/2018  Concern for CAP v ILD v blood aspiration (borderline PCT) - continue abx - check RVP and urine strep and  leg -> dc abx if negative  - need cxr followup as opd   Best practice:  Diet: N.p.o. Pain/Anxiety/Delirium protocol (if indicated): Sedation when intubated for tube tolerance VAP protocol (if indicated): Positive DVT prophylaxis: Compression hose no anticoagulation  with GI bleed GI prophylaxis: PPI drip Glucose control: Sliding scale insulin Mobility: Bedrest Code Status: Full Family Communication: Immediate bedside 05/19/2018 2100 hrs. None at bedside 05/20/2018 Disposition: ICU   ATTESTATION & SIGNATURE   The patient Christine Ortega is critically ill with multiple organ systems failure and requires high complexity decision making for assessment and support, frequent evaluation and titration of therapies, application of advanced monitoring technologies and extensive interpretation of multiple databases.   Critical Care Time devoted to patient care services described in this note is  30  Minutes. This time reflects time of care of this signee Dr Brand Males. This critical care time does not reflect procedure time, or teaching time or supervisory time of PA/NP/Med student/Med Resident etc but could involve care discussion time     Dr. Brand Males, M.D., [facility].C.P Pulmonary and Critical Care Medicine Staff Physician [facility] Pager: [phone], If no answer or between  15:00h - 7:00h: call [phone]  05/20/2018 8:43 AM

## 2018-05-20 NOTE — Progress Notes (Signed)
Per discussion with Dr. Chase Caller 05/20/2018, 3:29 PM discontinuing Mucinex-DM, Tessalon pearls, Geodon, and Prozac as patient is now intubated and hemodynamically unstable. Also, discussed that pharmacy will change Depakote ER 500mg  po QHS to IR 250mg  IV Q12h until able to take orals.   Note- consider restart of Prozac and Geodon alternative when more hemodynamically stable and able to receive medications per tube.   Sloan Leiter, PharmD, BCPS, BCCCP Clinical Pharmacist Clinical phone 05/20/2018 until [phone] Please refer to [facility] for [place] numbers 05/20/2018, 3:31 PM

## 2018-05-20 NOTE — Progress Notes (Signed)
CCM MD Ramaswamy aware of patients temp rising Confirmed oral temp of 103.1 Foley temp reading falsely elevated at times Ice packs have already been applied  Order for one time dose of Tylenol given Will continue to monitor

## 2018-05-21 ENCOUNTER — Encounter (HOSPITAL_COMMUNITY): Payer: Self-pay

## 2018-05-21 DIAGNOSIS — K922 Gastrointestinal hemorrhage, unspecified: Secondary | ICD-10-CM

## 2018-05-21 DIAGNOSIS — K250 Acute gastric ulcer with hemorrhage: Secondary | ICD-10-CM

## 2018-05-21 DIAGNOSIS — J189 Pneumonia, unspecified organism: Secondary | ICD-10-CM

## 2018-05-21 DIAGNOSIS — J9600 Acute respiratory failure, unspecified whether with hypoxia or hypercapnia: Secondary | ICD-10-CM

## 2018-05-21 LAB — HEPATIC FUNCTION PANEL
ALBUMIN: 1.7 g/dL — AB (ref 3.5–5.0)
ALT: 13 U/L (ref 0–44)
AST: 41 U/L (ref 15–41)
Alkaline Phosphatase: 65 U/L (ref 38–126)
Bilirubin, Direct: 0.1 mg/dL (ref 0.0–0.2)
Indirect Bilirubin: 0.5 mg/dL (ref 0.3–0.9)
Total Bilirubin: 0.6 mg/dL (ref 0.3–1.2)
Total Protein: 4.7 g/dL — ABNORMAL LOW (ref 6.5–8.1)

## 2018-05-21 LAB — CBC WITH DIFFERENTIAL/PLATELET
Abs Immature Granulocytes: 0.08 10*3/uL — ABNORMAL HIGH (ref 0.00–0.07)
Basophils Absolute: 0.1 10*3/uL (ref 0.0–0.1)
Basophils Relative: 1 %
Eosinophils Absolute: 0.3 10*3/uL (ref 0.0–0.5)
Eosinophils Relative: 2 %
HCT: 32 % — ABNORMAL LOW (ref 36.0–46.0)
Hemoglobin: 11 g/dL — ABNORMAL LOW (ref 12.0–15.0)
IMMATURE GRANULOCYTES: 1 %
Lymphocytes Relative: 27 %
Lymphs Abs: 4.5 10*3/uL — ABNORMAL HIGH (ref 0.7–4.0)
MCH: 29.1 pg (ref 26.0–34.0)
MCHC: 34.4 g/dL (ref 30.0–36.0)
MCV: 84.7 fL (ref 80.0–100.0)
Monocytes Absolute: 1.5 10*3/uL — ABNORMAL HIGH (ref 0.1–1.0)
Monocytes Relative: 9 %
NEUTROS PCT: 60 %
Neutro Abs: 10 10*3/uL — ABNORMAL HIGH (ref 1.7–7.7)
Platelets: 225 10*3/uL (ref 150–400)
RBC: 3.78 MIL/uL — ABNORMAL LOW (ref 3.87–5.11)
RDW: 17.2 % — ABNORMAL HIGH (ref 11.5–15.5)
WBC: 16.5 10*3/uL — AB (ref 4.0–10.5)
nRBC: 0.4 % — ABNORMAL HIGH (ref 0.0–0.2)

## 2018-05-21 LAB — BASIC METABOLIC PANEL
Anion gap: 7 (ref 5–15)
BUN: 23 mg/dL — ABNORMAL HIGH (ref 6–20)
CO2: 18 mmol/L — ABNORMAL LOW (ref 22–32)
Calcium: 7.1 mg/dL — ABNORMAL LOW (ref 8.9–10.3)
Chloride: 117 mmol/L — ABNORMAL HIGH (ref 98–111)
Creatinine, Ser: 1.17 mg/dL — ABNORMAL HIGH (ref 0.44–1.00)
GFR calc non Af Amer: 52 mL/min — ABNORMAL LOW (ref >60)
GFR, EST AFRICAN AMERICAN: 60 mL/min — AB (ref >60)
Glucose, Bld: 144 mg/dL — ABNORMAL HIGH (ref 70–99)
Potassium: 3.3 mmol/L — ABNORMAL LOW (ref 3.5–5.1)
Sodium: 142 mmol/L (ref 135–145)

## 2018-05-21 LAB — CBC
HCT: 31.1 % — ABNORMAL LOW (ref 36.0–46.0)
Hemoglobin: 9.9 g/dL — ABNORMAL LOW (ref 12.0–15.0)
MCH: 28 pg (ref 26.0–34.0)
MCHC: 31.8 g/dL (ref 30.0–36.0)
MCV: 88.1 fL (ref 80.0–100.0)
Platelets: 172 10*3/uL (ref 150–400)
RBC: 3.53 MIL/uL — ABNORMAL LOW (ref 3.87–5.11)
RDW: 17.6 % — ABNORMAL HIGH (ref 11.5–15.5)
WBC: 17.1 10*3/uL — ABNORMAL HIGH (ref 4.0–10.5)
nRBC: 0.2 % (ref 0.0–0.2)

## 2018-05-21 LAB — PROTIME-INR
INR: 1.17
Prothrombin Time: 14.8 seconds (ref 11.4–15.2)

## 2018-05-21 LAB — LEGIONELLA PNEUMOPHILA SEROGP 1 UR AG: L. PNEUMOPHILA SEROGP 1 UR AG: NEGATIVE

## 2018-05-21 LAB — MAGNESIUM: Magnesium: 2 mg/dL (ref 1.7–2.4)

## 2018-05-21 LAB — TROPONIN I: TROPONIN I: 1.12 ng/mL — AB (ref <0.03)

## 2018-05-21 LAB — LACTIC ACID, PLASMA: Lactic Acid, Venous: 0.9 mmol/L (ref 0.5–1.9)

## 2018-05-21 LAB — PHOSPHORUS: PHOSPHORUS: 1.9 mg/dL — AB (ref 2.5–4.6)

## 2018-05-21 MED ORDER — ORAL CARE MOUTH RINSE
15.0000 mL | Freq: Two times a day (BID) | OROMUCOSAL | Status: DC
  Administered 2018-05-21 – 2018-05-22 (×3): 15 mL via OROMUCOSAL

## 2018-05-21 MED ORDER — POTASSIUM PHOSPHATES 15 MMOLE/5ML IV SOLN
30.0000 mmol | Freq: Once | INTRAVENOUS | Status: AC
  Administered 2018-05-21: 30 mmol via INTRAVENOUS
  Filled 2018-05-21: qty 10

## 2018-05-21 MED ORDER — POTASSIUM CHLORIDE 10 MEQ/100ML IV SOLN: 10.0000 meq | INTRAVENOUS | Status: DC

## 2018-05-21 MED ORDER — PANTOPRAZOLE SODIUM 40 MG IV SOLR
40.0000 mg | Freq: Two times a day (BID) | INTRAVENOUS | Status: DC
  Administered 2018-05-22 – 2018-05-29 (×14): 40 mg via INTRAVENOUS
  Filled 2018-05-21 (×14): qty 40

## 2018-05-21 MED ORDER — CHLORHEXIDINE GLUCONATE 0.12 % MT SOLN
15.0000 mL | Freq: Two times a day (BID) | OROMUCOSAL | Status: DC
  Administered 2018-05-22 (×3): 15 mL via OROMUCOSAL
  Filled 2018-05-21 (×2): qty 15

## 2018-05-21 MED ORDER — ZIPRASIDONE MESYLATE 20 MG IM SOLR
20.0000 mg | Freq: Once | INTRAMUSCULAR | Status: AC
  Administered 2018-05-21: 20 mg via INTRAMUSCULAR
  Filled 2018-05-21 (×2): qty 20

## 2018-05-21 NOTE — Progress Notes (Addendum)
Daily Rounding Note  05/21/2018, 9:01 AM  LOS: 2 days   SUBJECTIVE:   Chief complaint: bleeding gastric/bulbar ulcer.  Blood loss anemia  Remains intubated on Diprivan.  PPI drip continues.  Decreasing doses of Levophed.   Stools overnight dark, smear of dark stool this AM.      OBJECTIVE:         Vital signs in last 24 hours:    Temp:  [98 F (36.7 C)-103.6 F (39.8 C)] 98.2 F (36.8 C) (02/17 0845) Pulse Rate:  [65-107] 76 (02/17 0845) Resp:  [23-26] 26 (02/17 0845) BP: (90-142)/(49-127) 119/74 (02/17 0800) SpO2:  [99 %-100 %] 100 % (02/17 0845) Arterial Line BP: (91-151)/(53-75) 134/66 (02/17 0845) FiO2 (%):  [40 %] 40 % (02/17 0800) Weight:  [67.2 kg] 67.2 kg (02/17 0308) Last BM Date: (P) 05/21/18 Filed Weights   05/19/18 0650 05/21/18 0308  Weight: 63.5 kg 67.2 kg   General: pale, intubated on vent   Heart: rrr Chest: clear in front, no labored breathing on vent Abdomen: soft, NT, ND, hypoactive BS  Extremities: minor non-pitting pedal edema, feet warm Neuro/Psych:  Sedated.    Intake/Output from previous day: 02/16 0701 - 02/17 0700 In: 2080.4 [I.V.:1577.9; IV Piggyback:502.5] Out: 750 [Urine:750]  Intake/Output this shift: Total I/O In: 396.3 [I.V.:396.3] Out: -   Lab Results: Recent Labs    05/20/18 1344 05/20/18 1748 05/21/18 0450  WBC 17.0* 17.8* 16.5*  HGB 11.7* 12.1 11.0*  HCT 35.8* 37.4 32.0*  PLT 229 242 225   BMET Recent Labs    05/19/18 1839 05/20/18 0233 05/20/18 0256 05/20/18 1049 05/21/18 0000  NA 141 141 140 146* 142  K 4.3 4.4 4.4 4.3 3.3*  CL 113* 116*  --   --  117*  CO2 21* 17*  --   --  18*  GLUCOSE 86 196*  --   --  144*  BUN 14 16  --   --  23*  CREATININE 0.80 1.08*  --   --  1.17*  CALCIUM 7.5* 6.6*  --   --  7.1*   LFT Recent Labs    05/19/18 1839 05/21/18 0450  PROT 5.0* 4.7*  ALBUMIN 1.9* 1.7*  AST 33 41  ALT 12 13  ALKPHOS 79 65  BILITOT  0.2* 0.6  BILIDIR  --  0.1  IBILI  --  0.5   PT/INR Recent Labs    05/19/18 1839 05/21/18 0450  LABPROT 15.4* 14.8  INR 1.23 1.17    Studies/Results: Ir Angiogram Visceral Selective  Result Date: 05/20/2018 INDICATION: Acute life-threatening upper GI bleed secondary to gastric/duodenal ulcer. EXAM: 1. ULTRASOUND GUIDANCE FOR ARTERIAL ACCESS 2. SELECTIVE SUPERIOR MESENTERIC ARTERIOGRAM 3. SELECTIVE CELIAC ARTERIOGRAM 4. SELECTIVE COMMON HEPATIC ARTERIOGRAM 5. SELECTIVE GASTRODUODENAL ARTERIOGRAM AND PERCUTANEOUS COIL EMBOLIZATION MEDICATIONS: None ANESTHESIA/SEDATION: Moderate (conscious) sedation was employed during this procedure. A total of Fentanyl 50 mcg was administered intravenously. Moderate Sedation Time: 30 minutes. The patient's level of consciousness and vital signs were monitored continuously by radiology nursing throughout the procedure under my direct supervision. CONTRAST:  50 cc Isovue-300 FLUOROSCOPY TIME:  6 minutes, 6 seconds (830 mGy) COMPLICATIONS: None immediate. PROCEDURE: Informed consent was obtained from the patient's family following explanation of the procedure, risks, benefits and alternatives. The patient understands, agrees and consents for the procedure. All questions were addressed. A time out was performed prior to the initiation of the procedure. Maximal barrier sterile technique utilized including caps,  mask, sterile gowns, sterile gloves, large sterile drape, hand hygiene, and Betadine prep. Attempts were made by the ICU staff to obtain a right femoral arterial line however this ultimately proved unsuccessful. Given the failed attempts, the decision was made to access the left common femoral artery. The left femoral head was marked fluoroscopically. Under ultrasound guidance, the left common femoral artery was accessed with a micropuncture kit after the overlying soft tissues were anesthetized with 1% lidocaine. An ultrasound image was saved for documentation  purposes. The micropuncture sheath was exchanged for a 5 [place] vascular sheath over a Bentson wire. A closure arteriogram was performed through the side of the sheath confirming access within the right common femoral artery. Over a Bentson wire, a Mickelson catheter was advanced to the level of the thoracic aorta where it was back bled and flushed. The catheter was then utilized to select the celiac artery and a selective celiac arteriogram was performed The Mickelson catheter was then utilized to select the superior mesenteric artery and a selective superior mesenteric arteriogram was performed. Again, the Mickelson catheter was utilized to select the celiac artery and a selective celiac arteriogram was performed. Next, with the use of a fathom 14 wire, a regular Renegade microcatheter was advanced into the gastroduodenal artery and a sub selective gastroduodenal arteriogram was performed. A GDA was then coil embolized from its distal aspect to near its origin with multiple overlapping 2, 3 and 4 mm diameter interlock coils. The microcatheter was retracted into the common hepatic artery and a selective common hepatic arteriogram was performed. Next, the microcatheter was removed and a repeat celiac arteriogram was performed via the [facility] catheter. Finally the [facility] catheter was utilized to select the superior mesenteric artery and a selective superior mesenteric arteriogram was performed. Images were reviewed and the procedure was terminated. All wires and catheters removed from the patient. The left common femoral artery vascular sheath was sutured in place for continued arterial monitoring as per the ICU staff. Dressings were placed. FINDINGS: Celiac arteriogram demonstrates conventional branching pattern without definitive area of vessel irregularity or active extravasation. Selective gastroduodenal arteriogram demonstrates hyperemia at the expected location of the duodenal bulb without discrete area  active extravasation or vessel irregularity. Following empiric percutaneous coil embolization, there is complete embolization of the gastroduodenal artery. Superior mesenteric arteriogram was negative for definitive retrograde contribution to the gastroduodenal arterial tree. IMPRESSION: Technically successful inferior coil embolization of the gastroduodenal artery for life-threatening upper GI bleed due to gastric/duodenal ulcer. Electronically Signed   By: Sandi Mariscal M.D.   On: 05/20/2018 09:18    Dg Chest Port 1 View  Result Date: 05/19/2018 CLINICAL DATA:  Respiratory failure EXAM: PORTABLE CHEST 1 VIEW COMPARISON:  Chest x-ray from earlier same day. FINDINGS: Endotracheal tube has been placed with tip well-positioned at approximately 3 cm above the carina. Heart size and mediastinal contours are stable in the short-term interval. The bilateral reticulonodular opacities are unchanged in the short-term interval. No pleural effusion or pneumothorax seen. IMPRESSION: 1. Endotracheal tube well positioned with tip approximately 3 cm above the carina. 2. Otherwise stable exam. Electronically Signed   By: Franki Cabot M.D.   On: 05/19/2018 23:29   Dg Abd Portable 1v  Result Date: 05/20/2018 CLINICAL DATA:  Orogastric tube placement. EXAM: PORTABLE ABDOMEN - 1 VIEW COMPARISON:  One view abdomen 05/19/2018. FINDINGS: 1358 hours. Enteric tube is looped in the proximal stomach. Interval decompression of the stomach. There is moderate distention of the colon. Embolization clips overlie  the upper lumbar spine. There are fibrotic changes at both lung bases. IMPRESSION: Enteric tube is coiled in the proximal stomach with good decompression of the stomach. Increased colonic distension, likely ileus. Electronically Signed   By: Richardean Sale M.D.   On: 05/20/2018 14:23   Dg Abd Portable 1v  Result Date: 05/19/2018 CLINICAL DATA:  Bowel perforation. EXAM: PORTABLE ABDOMEN - 1 VIEW COMPARISON:  None. FINDINGS:  Stomach and small bowel are distended with air. No evidence of small bowel obstruction. No convincing evidence of free intraperitoneal air. IMPRESSION: No convincing evidence of bowel perforation on this semi-erect plain film examination of the abdomen. If clinical concern for bowel perforation persists, would consider additional decubitus view. Electronically Signed   By: Franki Cabot M.D.   On: 05/19/2018 23:31   Scheduled Meds: . sodium chloride   Intravenous Once  . sodium chloride   Intravenous Once  . sodium chloride   Intravenous Once  . sodium chloride   Intravenous Once  . chlorhexidine gluconate (MEDLINE KIT)  15 mL Mouth Rinse BID  . mouth rinse  15 mL Mouth Rinse 10 times per day  . ziprasidone  20 mg Intramuscular Once   Continuous Infusions: . azithromycin 500 mg (05/21/18 1028)  . cefTRIAXone (ROCEPHIN)  IV Stopped (05/21/18 0955)  . dextrose 5% lactated ringers Stopped (05/20/18 1315)  . norepinephrine (LEVOPHED) Adult infusion 12 mcg/min (05/21/18 1000)  . pantoprozole (PROTONIX) infusion 8 mg/hr (05/21/18 1000)  . potassium chloride    . propofol (DIPRIVAN) infusion 50 mcg/kg/min (05/21/18 1024)  . valproate sodium Stopped (05/20/18 2354)   PRN Meds:.cyclobenzaprine, docusate, fentaNYL (SUBLIMAZE) injection, HYDROcodone-homatropine, HYDROmorphone (DILAUDID) injection, midazolam    ASSESMENT:   *   UGI bleed with melena at admission 05/19/2018 EGD: giant antral/bulb actively bleeding ulcer, not amenable to endoscopic therapy.  Blood and clot in stomach limited visualization.   S/p 2/16 IR with GDA embolization  Chronic ASA powders (10 - 20 daily) and Ibuprofen, Naprosyn for back pain at home.    *   ABL anemia s/p PRBC x 7 U.  Hgb 4.1 >> 11.      *   Schizophrenia, siezure disorder.    *   Protein malnutrition. Albumin 1.9  *   ILD vs atypical lung infection vs CHF on CXR.  Rocephin in place, remains on vent.     PLAN   *   Serum H Pylori Ab in AM.  BID  CBC.    *   PPI drip finishes tomorrow at 12:30.  Start IV  BID after that.    *   Keep NPO.    Christine Ortega  05/21/2018, 9:01 AM Phone [phone]    Attending Physician Note   I have taken an interval history, reviewed the chart and examined the patient. I agree with the Advanced Practitioner's note, impression and recommendations.   Giant gastric ulcer extending to bulb with major bleed not amenable to endoscopic therapy. Bleeding controlled post IR GDA embolization.  ABL anemia.  Extubated today.   Recommendations: Discontinue NSAIDs forever.  Trend CBC Clear liquid later today IV pantoprazole infusion to IV bid to PO bid long term H pylori Ab and treat if positive as outpatient Outpatient follow up with Dr. Havery Moros 1 month post discharge  Outpatient EGD with Dr. Havery Moros in next few months to assess ulcer healing If rebleed IR or surgical mgmt will necessary (ulcer is not amenable to endoscopic therapy) GI signing off, available if needed  Lucio Edward, MD FACG [phone]

## 2018-05-21 NOTE — Progress Notes (Signed)
Referring Physician(s): Dr Elsworth Soho  Supervising Physician: Markus Daft  Patient Status:  [facility] - In-pt  Chief Complaint:  Pre-procedure Diagnosis: Upper GI bleed from gastric ulcer Post-procedure Diagnosis: Same  2/16: Post mesenteric arteriogram and empiric embolization of the GDA.     Subjective:  Intubated Starting to wake  Allergies: Patient has no active allergies.  Medications: Prior to Admission medications   Medication Sig Start Date End Date Taking? Authorizing Provider  Aspirin-Salicylamide-Caffeine (ARTHRITIS STRENGTH BC POWDER PO) Take 2 packets by mouth 5 (five) times daily as needed (pain/headache).   Yes [provider]  divalproex (DEPAKOTE ER) 500 MG 24 hr tablet Take 500 mg by mouth at bedtime. For moods 03/08/18  Yes [provider]  FLUoxetine (PROZAC) 40 MG capsule Take 1 capsule (40 mg total) by mouth daily. For depression and anxiety. 08/08/11  Yes Mashburn, Marlane Hatcher, PA-C  ibuprofen (ADVIL,MOTRIN) 200 MG tablet Take 400-600 mg by mouth every 6 (six) hours as needed for headache (pain).   Yes [provider]  Melatonin 5 MG CAPS Take 20 mg by mouth at bedtime.    Yes [provider]  naproxen (NAPROSYN) 500 MG tablet Take 500-1,000 mg by mouth 3 (three) times daily as needed (pain/headache).  03/22/18  Yes [provider]  Probiotic Product (PROBIOTIC PO) Take 1 tablet by mouth daily. Women's probiotic for GI health   Yes [provider]  ziprasidone (GEODON) 80 MG capsule Take one capsule at bedtime for depression and mood stability. Patient taking differently: Take 80 mg by mouth at bedtime. for depression and mood stability. 08/08/11  Yes Ruben Im, PA-C     Vital Signs: BP 119/74 (BP Location: Right Arm)   Pulse 87   Temp 98.1 F (36.7 C)   Resp 20   Ht '5\' 3"'  (1.6 m)   Wt 148 lb 2.4 oz (67.2 kg)   SpO2 100%   BMI 26.24 kg/m   Physical Exam Constitutional:      Comments: Intubated;  sedated  Skin:    General: Skin is warm and dry.     Comments: Bilat groin sheathes in place  Rt is CCM placed sheath Left is IR placed sheath--RN says still using left sheath for art line Site is clean and dry No bleeding No hematoma Left foot with good pulse     Imaging: Dg Chest 2 View  Result Date: 05/19/2018 CLINICAL DATA:  Shortness of breath and cough EXAM: CHEST - 2 VIEW COMPARISON:  None. FINDINGS: There is diffuse reticulonodular interstitial disease throughout the lungs bilaterally. No consolidation. Heart size and pulmonary vascularity are normal. No adenopathy. No bone lesions. IMPRESSION: Widespread reticulonodular interstitial disease without consolidation. Question chronicity of this interstitial disease. Differential considerations must include atypical infection, somewhat atypical appearance of edema secondary to congestive heart failure, allergic type phenomenon, and possible underlying noncardiogenic interstitial disease. Short interval follow-up chest radiograph advised in this circumstance; repeat study in 2-3 weeks after appropriate therapy based on clinical symptoms advised. Heart size and pulmonary vascularity are normal. No adenopathy evident. Electronically Signed   By: Lowella Grip III M.D.   On: 05/19/2018 08:21   Ir Angiogram Visceral Selective  Result Date: 05/20/2018 INDICATION: Acute life-threatening upper GI bleed secondary to gastric/duodenal ulcer. EXAM: 1. ULTRASOUND GUIDANCE FOR ARTERIAL ACCESS 2. SELECTIVE SUPERIOR MESENTERIC ARTERIOGRAM 3. SELECTIVE CELIAC ARTERIOGRAM 4. SELECTIVE COMMON HEPATIC ARTERIOGRAM 5. SELECTIVE GASTRODUODENAL ARTERIOGRAM AND PERCUTANEOUS COIL EMBOLIZATION MEDICATIONS: None ANESTHESIA/SEDATION: Moderate (conscious) sedation was employed during  this procedure. A total of Fentanyl 50 mcg was administered intravenously. Moderate Sedation Time: 30 minutes. The patient's level of consciousness and vital signs were monitored  continuously by radiology nursing throughout the procedure under my direct supervision. CONTRAST:  50 cc Isovue-300 FLUOROSCOPY TIME:  6 minutes, 6 seconds (735 mGy) COMPLICATIONS: None immediate. PROCEDURE: Informed consent was obtained from the patient's family following explanation of the procedure, risks, benefits and alternatives. The patient understands, agrees and consents for the procedure. All questions were addressed. A time out was performed prior to the initiation of the procedure. Maximal barrier sterile technique utilized including caps, mask, sterile gowns, sterile gloves, large sterile drape, hand hygiene, and Betadine prep. Attempts were made by the ICU staff to obtain a right femoral arterial line however this ultimately proved unsuccessful. Given the failed attempts, the decision was made to access the left common femoral artery. The left femoral head was marked fluoroscopically. Under ultrasound guidance, the left common femoral artery was accessed with a micropuncture kit after the overlying soft tissues were anesthetized with 1% lidocaine. An ultrasound image was saved for documentation purposes. The micropuncture sheath was exchanged for a 5 [place] vascular sheath over a Bentson wire. A closure arteriogram was performed through the side of the sheath confirming access within the right common femoral artery. Over a Bentson wire, a Mickelson catheter was advanced to the level of the thoracic aorta where it was back bled and flushed. The catheter was then utilized to select the celiac artery and a selective celiac arteriogram was performed The Mickelson catheter was then utilized to select the superior mesenteric artery and a selective superior mesenteric arteriogram was performed. Again, the Mickelson catheter was utilized to select the celiac artery and a selective celiac arteriogram was performed. Next, with the use of a fathom 14 wire, a regular Renegade microcatheter was advanced into the  gastroduodenal artery and a sub selective gastroduodenal arteriogram was performed. A GDA was then coil embolized from its distal aspect to near its origin with multiple overlapping 2, 3 and 4 mm diameter interlock coils. The microcatheter was retracted into the common hepatic artery and a selective common hepatic arteriogram was performed. Next, the microcatheter was removed and a repeat celiac arteriogram was performed via the [facility] catheter. Finally the [facility] catheter was utilized to select the superior mesenteric artery and a selective superior mesenteric arteriogram was performed. Images were reviewed and the procedure was terminated. All wires and catheters removed from the patient. The left common femoral artery vascular sheath was sutured in place for continued arterial monitoring as per the ICU staff. Dressings were placed. FINDINGS: Celiac arteriogram demonstrates conventional branching pattern without definitive area of vessel irregularity or active extravasation. Selective gastroduodenal arteriogram demonstrates hyperemia at the expected location of the duodenal bulb without discrete area active extravasation or vessel irregularity. Following empiric percutaneous coil embolization, there is complete embolization of the gastroduodenal artery. Superior mesenteric arteriogram was negative for definitive retrograde contribution to the gastroduodenal arterial tree. IMPRESSION: Technically successful inferior coil embolization of the gastroduodenal artery for life-threatening upper GI bleed due to gastric/duodenal ulcer. Electronically Signed   By: Sandi Mariscal M.D.   On: 05/20/2018 09:18   Ir Angiogram Visceral Selective  Result Date: 05/20/2018 INDICATION: Acute life-threatening upper GI bleed secondary to gastric/duodenal ulcer. EXAM: 1. ULTRASOUND GUIDANCE FOR ARTERIAL ACCESS 2. SELECTIVE SUPERIOR MESENTERIC ARTERIOGRAM 3. SELECTIVE CELIAC ARTERIOGRAM 4. SELECTIVE COMMON HEPATIC ARTERIOGRAM 5.  SELECTIVE GASTRODUODENAL ARTERIOGRAM AND PERCUTANEOUS COIL EMBOLIZATION MEDICATIONS: None ANESTHESIA/SEDATION: Moderate (conscious)  sedation was employed during this procedure. A total of Fentanyl 50 mcg was administered intravenously. Moderate Sedation Time: 30 minutes. The patient's level of consciousness and vital signs were monitored continuously by radiology nursing throughout the procedure under my direct supervision. CONTRAST:  50 cc Isovue-300 FLUOROSCOPY TIME:  6 minutes, 6 seconds (761 mGy) COMPLICATIONS: None immediate. PROCEDURE: Informed consent was obtained from the patient's family following explanation of the procedure, risks, benefits and alternatives. The patient understands, agrees and consents for the procedure. All questions were addressed. A time out was performed prior to the initiation of the procedure. Maximal barrier sterile technique utilized including caps, mask, sterile gowns, sterile gloves, large sterile drape, hand hygiene, and Betadine prep. Attempts were made by the ICU staff to obtain a right femoral arterial line however this ultimately proved unsuccessful. Given the failed attempts, the decision was made to access the left common femoral artery. The left femoral head was marked fluoroscopically. Under ultrasound guidance, the left common femoral artery was accessed with a micropuncture kit after the overlying soft tissues were anesthetized with 1% lidocaine. An ultrasound image was saved for documentation purposes. The micropuncture sheath was exchanged for a 5 [place] vascular sheath over a Bentson wire. A closure arteriogram was performed through the side of the sheath confirming access within the right common femoral artery. Over a Bentson wire, a Mickelson catheter was advanced to the level of the thoracic aorta where it was back bled and flushed. The catheter was then utilized to select the celiac artery and a selective celiac arteriogram was performed The Mickelson catheter  was then utilized to select the superior mesenteric artery and a selective superior mesenteric arteriogram was performed. Again, the Mickelson catheter was utilized to select the celiac artery and a selective celiac arteriogram was performed. Next, with the use of a fathom 14 wire, a regular Renegade microcatheter was advanced into the gastroduodenal artery and a sub selective gastroduodenal arteriogram was performed. A GDA was then coil embolized from its distal aspect to near its origin with multiple overlapping 2, 3 and 4 mm diameter interlock coils. The microcatheter was retracted into the common hepatic artery and a selective common hepatic arteriogram was performed. Next, the microcatheter was removed and a repeat celiac arteriogram was performed via the [facility] catheter. Finally the [facility] catheter was utilized to select the superior mesenteric artery and a selective superior mesenteric arteriogram was performed. Images were reviewed and the procedure was terminated. All wires and catheters removed from the patient. The left common femoral artery vascular sheath was sutured in place for continued arterial monitoring as per the ICU staff. Dressings were placed. FINDINGS: Celiac arteriogram demonstrates conventional branching pattern without definitive area of vessel irregularity or active extravasation. Selective gastroduodenal arteriogram demonstrates hyperemia at the expected location of the duodenal bulb without discrete area active extravasation or vessel irregularity. Following empiric percutaneous coil embolization, there is complete embolization of the gastroduodenal artery. Superior mesenteric arteriogram was negative for definitive retrograde contribution to the gastroduodenal arterial tree. IMPRESSION: Technically successful inferior coil embolization of the gastroduodenal artery for life-threatening upper GI bleed due to gastric/duodenal ulcer. Electronically Signed   By: Sandi Mariscal M.D.   On:  05/20/2018 09:18   Ir Angiogram Selective Each Additional Vessel  Result Date: 05/21/2018 INDICATION: Acute life-threatening upper GI bleed secondary to gastric/duodenal ulcer.  EXAM: 1. ULTRASOUND GUIDANCE FOR ARTERIAL ACCESS 2. SELECTIVE SUPERIOR MESENTERIC ARTERIOGRAM 3. SELECTIVE CELIAC ARTERIOGRAM 4. SELECTIVE COMMON HEPATIC ARTERIOGRAM 5. SELECTIVE GASTRODUODENAL ARTERIOGRAM AND PERCUTANEOUS COIL  EMBOLIZATION  MEDICATIONS: None  ANESTHESIA/SEDATION: Moderate (conscious) sedation was employed during this procedure. A total of Fentanyl 50 mcg was administered intravenously.  Moderate Sedation Time: 30 minutes. The patient's level of consciousness and vital signs were monitored continuously by radiology nursing throughout the procedure under my direct supervision.  CONTRAST:  50 cc Isovue-300  FLUOROSCOPY TIME:  6 minutes, 6 seconds (329 mGy)  COMPLICATIONS: None immediate.  PROCEDURE: Informed consent was obtained from the patient's family following explanation of the procedure, risks, benefits and alternatives. The patient understands, agrees and consents for the procedure. All questions were addressed. A time out was performed prior to the initiation of the procedure. Maximal barrier sterile technique utilized including caps, mask, sterile gowns, sterile gloves, large sterile drape, hand hygiene, and Betadine prep.  Attempts were made by the ICU staff to obtain a right femoral arterial line however this ultimately proved unsuccessful. Given the failed attempts, the decision was made to access the left common femoral artery.  The left femoral head was marked fluoroscopically. Under ultrasound guidance, the left common femoral artery was accessed with a micropuncture kit after the overlying soft tissues were anesthetized with 1% lidocaine. An ultrasound image was saved for documentation purposes. The micropuncture sheath was exchanged for a 5 [place] vascular sheath over a Bentson wire. A closure  arteriogram was performed through the side of the sheath confirming access within the right common femoral artery.  Over a Bentson wire, a Mickelson catheter was advanced to the level of the thoracic aorta where it was back bled and flushed. The catheter was then utilized to select the celiac artery and a selective celiac arteriogram was performed  The Mickelson catheter was then utilized to select the superior mesenteric artery and a selective superior mesenteric arteriogram was performed.  Again, the Mickelson catheter was utilized to select the celiac artery and a selective celiac arteriogram was performed.  Next, with the use of a fathom 14 wire, a regular Renegade microcatheter was advanced into the gastroduodenal artery and a sub selective gastroduodenal arteriogram was performed.  A GDA was then coil embolized from its distal aspect to near its origin with multiple overlapping 2, 3 and 4 mm diameter interlock coils.  The microcatheter was retracted into the common hepatic artery and a selective common hepatic arteriogram was performed. Next, the microcatheter was removed and a repeat celiac arteriogram was performed via the [facility] catheter.  Finally the [facility] catheter was utilized to select the superior mesenteric artery and a selective superior mesenteric arteriogram was performed.  Images were reviewed and the procedure was terminated. All wires and catheters removed from the patient.  The left common femoral artery vascular sheath was sutured in place for continued arterial monitoring as per the ICU staff. Dressings were placed.  FINDINGS: Celiac arteriogram demonstrates conventional branching pattern without definitive area of vessel irregularity or active extravasation.  Selective gastroduodenal arteriogram demonstrates hyperemia at the expected location of the duodenal bulb without discrete area active extravasation or vessel irregularity. Following empiric percutaneous coil  embolization, there is complete embolization of the gastroduodenal artery.  Superior mesenteric arteriogram was negative for definitive retrograde contribution to the gastroduodenal arterial tree.  IMPRESSION: Technically successful inferior coil embolization of the gastroduodenal artery for life-threatening upper GI bleed due to gastric/duodenal ulcer.   Electronically Signed   By: Sandi Mariscal M.D.   On: 05/20/2018 09:18   Ir Angiogram Selective Each Additional Vessel  Result Date: 05/20/2018 INDICATION: Acute life-threatening upper GI bleed secondary to gastric/duodenal  ulcer. EXAM: 1. ULTRASOUND GUIDANCE FOR ARTERIAL ACCESS 2. SELECTIVE SUPERIOR MESENTERIC ARTERIOGRAM 3. SELECTIVE CELIAC ARTERIOGRAM 4. SELECTIVE COMMON HEPATIC ARTERIOGRAM 5. SELECTIVE GASTRODUODENAL ARTERIOGRAM AND PERCUTANEOUS COIL EMBOLIZATION MEDICATIONS: None ANESTHESIA/SEDATION: Moderate (conscious) sedation was employed during this procedure. A total of Fentanyl 50 mcg was administered intravenously. Moderate Sedation Time: 30 minutes. The patient's level of consciousness and vital signs were monitored continuously by radiology nursing throughout the procedure under my direct supervision. CONTRAST:  50 cc Isovue-300 FLUOROSCOPY TIME:  6 minutes, 6 seconds (962 mGy) COMPLICATIONS: None immediate. PROCEDURE: Informed consent was obtained from the patient's family following explanation of the procedure, risks, benefits and alternatives. The patient understands, agrees and consents for the procedure. All questions were addressed. A time out was performed prior to the initiation of the procedure. Maximal barrier sterile technique utilized including caps, mask, sterile gowns, sterile gloves, large sterile drape, hand hygiene, and Betadine prep. Attempts were made by the ICU staff to obtain a right femoral arterial line however this ultimately proved unsuccessful. Given the failed attempts, the decision was made to access the left common  femoral artery. The left femoral head was marked fluoroscopically. Under ultrasound guidance, the left common femoral artery was accessed with a micropuncture kit after the overlying soft tissues were anesthetized with 1% lidocaine. An ultrasound image was saved for documentation purposes. The micropuncture sheath was exchanged for a 5 [place] vascular sheath over a Bentson wire. A closure arteriogram was performed through the side of the sheath confirming access within the right common femoral artery. Over a Bentson wire, a Mickelson catheter was advanced to the level of the thoracic aorta where it was back bled and flushed. The catheter was then utilized to select the celiac artery and a selective celiac arteriogram was performed The Mickelson catheter was then utilized to select the superior mesenteric artery and a selective superior mesenteric arteriogram was performed. Again, the Mickelson catheter was utilized to select the celiac artery and a selective celiac arteriogram was performed. Next, with the use of a fathom 14 wire, a regular Renegade microcatheter was advanced into the gastroduodenal artery and a sub selective gastroduodenal arteriogram was performed. A GDA was then coil embolized from its distal aspect to near its origin with multiple overlapping 2, 3 and 4 mm diameter interlock coils. The microcatheter was retracted into the common hepatic artery and a selective common hepatic arteriogram was performed. Next, the microcatheter was removed and a repeat celiac arteriogram was performed via the [facility] catheter. Finally the [facility] catheter was utilized to select the superior mesenteric artery and a selective superior mesenteric arteriogram was performed. Images were reviewed and the procedure was terminated. All wires and catheters removed from the patient. The left common femoral artery vascular sheath was sutured in place for continued arterial monitoring as per the ICU staff. Dressings were  placed. FINDINGS: Celiac arteriogram demonstrates conventional branching pattern without definitive area of vessel irregularity or active extravasation. Selective gastroduodenal arteriogram demonstrates hyperemia at the expected location of the duodenal bulb without discrete area active extravasation or vessel irregularity. Following empiric percutaneous coil embolization, there is complete embolization of the gastroduodenal artery. Superior mesenteric arteriogram was negative for definitive retrograde contribution to the gastroduodenal arterial tree. IMPRESSION: Technically successful inferior coil embolization of the gastroduodenal artery for life-threatening upper GI bleed due to gastric/duodenal ulcer. Electronically Signed   By: Sandi Mariscal M.D.   On: 05/20/2018 09:18   Ir US Guide Vasc Access Left  Result Date: 05/20/2018 INDICATION: Acute life-threatening upper  GI bleed secondary to gastric/duodenal ulcer. EXAM: 1. ULTRASOUND GUIDANCE FOR ARTERIAL ACCESS 2. SELECTIVE SUPERIOR MESENTERIC ARTERIOGRAM 3. SELECTIVE CELIAC ARTERIOGRAM 4. SELECTIVE COMMON HEPATIC ARTERIOGRAM 5. SELECTIVE GASTRODUODENAL ARTERIOGRAM AND PERCUTANEOUS COIL EMBOLIZATION MEDICATIONS: None ANESTHESIA/SEDATION: Moderate (conscious) sedation was employed during this procedure. A total of Fentanyl 50 mcg was administered intravenously. Moderate Sedation Time: 30 minutes. The patient's level of consciousness and vital signs were monitored continuously by radiology nursing throughout the procedure under my direct supervision. CONTRAST:  50 cc Isovue-300 FLUOROSCOPY TIME:  6 minutes, 6 seconds (185 mGy) COMPLICATIONS: None immediate. PROCEDURE: Informed consent was obtained from the patient's family following explanation of the procedure, risks, benefits and alternatives. The patient understands, agrees and consents for the procedure. All questions were addressed. A time out was performed prior to the initiation of the procedure. Maximal  barrier sterile technique utilized including caps, mask, sterile gowns, sterile gloves, large sterile drape, hand hygiene, and Betadine prep. Attempts were made by the ICU staff to obtain a right femoral arterial line however this ultimately proved unsuccessful. Given the failed attempts, the decision was made to access the left common femoral artery. The left femoral head was marked fluoroscopically. Under ultrasound guidance, the left common femoral artery was accessed with a micropuncture kit after the overlying soft tissues were anesthetized with 1% lidocaine. An ultrasound image was saved for documentation purposes. The micropuncture sheath was exchanged for a 5 [place] vascular sheath over a Bentson wire. A closure arteriogram was performed through the side of the sheath confirming access within the right common femoral artery. Over a Bentson wire, a Mickelson catheter was advanced to the level of the thoracic aorta where it was back bled and flushed. The catheter was then utilized to select the celiac artery and a selective celiac arteriogram was performed The Mickelson catheter was then utilized to select the superior mesenteric artery and a selective superior mesenteric arteriogram was performed. Again, the Mickelson catheter was utilized to select the celiac artery and a selective celiac arteriogram was performed. Next, with the use of a fathom 14 wire, a regular Renegade microcatheter was advanced into the gastroduodenal artery and a sub selective gastroduodenal arteriogram was performed. A GDA was then coil embolized from its distal aspect to near its origin with multiple overlapping 2, 3 and 4 mm diameter interlock coils. The microcatheter was retracted into the common hepatic artery and a selective common hepatic arteriogram was performed. Next, the microcatheter was removed and a repeat celiac arteriogram was performed via the [facility] catheter. Finally the [facility] catheter was utilized to select  the superior mesenteric artery and a selective superior mesenteric arteriogram was performed. Images were reviewed and the procedure was terminated. All wires and catheters removed from the patient. The left common femoral artery vascular sheath was sutured in place for continued arterial monitoring as per the ICU staff. Dressings were placed. FINDINGS: Celiac arteriogram demonstrates conventional branching pattern without definitive area of vessel irregularity or active extravasation. Selective gastroduodenal arteriogram demonstrates hyperemia at the expected location of the duodenal bulb without discrete area active extravasation or vessel irregularity. Following empiric percutaneous coil embolization, there is complete embolization of the gastroduodenal artery. Superior mesenteric arteriogram was negative for definitive retrograde contribution to the gastroduodenal arterial tree. IMPRESSION: Technically successful inferior coil embolization of the gastroduodenal artery for life-threatening upper GI bleed due to gastric/duodenal ulcer. Electronically Signed   By: Sandi Mariscal M.D.   On: 05/20/2018 09:18   Dg Chest Port 1 View  Result Date: 05/19/2018  CLINICAL DATA:  Respiratory failure EXAM: PORTABLE CHEST 1 VIEW COMPARISON:  Chest x-ray from earlier same day. FINDINGS: Endotracheal tube has been placed with tip well-positioned at approximately 3 cm above the carina. Heart size and mediastinal contours are stable in the short-term interval. The bilateral reticulonodular opacities are unchanged in the short-term interval. No pleural effusion or pneumothorax seen. IMPRESSION: 1. Endotracheal tube well positioned with tip approximately 3 cm above the carina. 2. Otherwise stable exam. Electronically Signed   By: Franki Cabot M.D.   On: 05/19/2018 23:29   Dg Abd Portable 1v  Result Date: 05/20/2018 CLINICAL DATA:  Orogastric tube placement. EXAM: PORTABLE ABDOMEN - 1 VIEW COMPARISON:  One view abdomen  05/19/2018. FINDINGS: 1358 hours. Enteric tube is looped in the proximal stomach. Interval decompression of the stomach. There is moderate distention of the colon. Embolization clips overlie the upper lumbar spine. There are fibrotic changes at both lung bases. IMPRESSION: Enteric tube is coiled in the proximal stomach with good decompression of the stomach. Increased colonic distension, likely ileus. Electronically Signed   By: Richardean Sale M.D.   On: 05/20/2018 14:23   Dg Abd Portable 1v  Result Date: 05/19/2018 CLINICAL DATA:  Bowel perforation. EXAM: PORTABLE ABDOMEN - 1 VIEW COMPARISON:  None. FINDINGS: Stomach and small bowel are distended with air. No evidence of small bowel obstruction. No convincing evidence of free intraperitoneal air. IMPRESSION: No convincing evidence of bowel perforation on this semi-erect plain film examination of the abdomen. If clinical concern for bowel perforation persists, would consider additional decubitus view. Electronically Signed   By: Franki Cabot M.D.   On: 05/19/2018 23:31   [place]andy Guide Roadmapping  Result Date: 05/20/2018 INDICATION: Acute life-threatening upper GI bleed secondary to gastric/duodenal ulcer. EXAM: 1. ULTRASOUND GUIDANCE FOR ARTERIAL ACCESS 2. SELECTIVE SUPERIOR MESENTERIC ARTERIOGRAM 3. SELECTIVE CELIAC ARTERIOGRAM 4. SELECTIVE COMMON HEPATIC ARTERIOGRAM 5. SELECTIVE GASTRODUODENAL ARTERIOGRAM AND PERCUTANEOUS COIL EMBOLIZATION MEDICATIONS: None ANESTHESIA/SEDATION: Moderate (conscious) sedation was employed during this procedure. A total of Fentanyl 50 mcg was administered intravenously. Moderate Sedation Time: 30 minutes. The patient's level of consciousness and vital signs were monitored continuously by radiology nursing throughout the procedure under my direct supervision. CONTRAST:  50 cc Isovue-300 FLUOROSCOPY TIME:  6 minutes, 6 seconds (158 mGy) COMPLICATIONS: None immediate. PROCEDURE: Informed  consent was obtained from the patient's family following explanation of the procedure, risks, benefits and alternatives. The patient understands, agrees and consents for the procedure. All questions were addressed. A time out was performed prior to the initiation of the procedure. Maximal barrier sterile technique utilized including caps, mask, sterile gowns, sterile gloves, large sterile drape, hand hygiene, and Betadine prep. Attempts were made by the ICU staff to obtain a right femoral arterial line however this ultimately proved unsuccessful. Given the failed attempts, the decision was made to access the left common femoral artery. The left femoral head was marked fluoroscopically. Under ultrasound guidance, the left common femoral artery was accessed with a micropuncture kit after the overlying soft tissues were anesthetized with 1% lidocaine. An ultrasound image was saved for documentation purposes. The micropuncture sheath was exchanged for a 5 [place] vascular sheath over a Bentson wire. A closure arteriogram was performed through the side of the sheath confirming access within the right common femoral artery. Over a Bentson wire, a Mickelson catheter was advanced to the level of the thoracic aorta where it was back bled and flushed. The catheter was then  utilized to select the celiac artery and a selective celiac arteriogram was performed The Mickelson catheter was then utilized to select the superior mesenteric artery and a selective superior mesenteric arteriogram was performed. Again, the Mickelson catheter was utilized to select the celiac artery and a selective celiac arteriogram was performed. Next, with the use of a fathom 14 wire, a regular Renegade microcatheter was advanced into the gastroduodenal artery and a sub selective gastroduodenal arteriogram was performed. A GDA was then coil embolized from its distal aspect to near its origin with multiple overlapping 2, 3 and 4 mm diameter interlock  coils. The microcatheter was retracted into the common hepatic artery and a selective common hepatic arteriogram was performed. Next, the microcatheter was removed and a repeat celiac arteriogram was performed via the [facility] catheter. Finally the [facility] catheter was utilized to select the superior mesenteric artery and a selective superior mesenteric arteriogram was performed. Images were reviewed and the procedure was terminated. All wires and catheters removed from the patient. The left common femoral artery vascular sheath was sutured in place for continued arterial monitoring as per the ICU staff. Dressings were placed. FINDINGS: Celiac arteriogram demonstrates conventional branching pattern without definitive area of vessel irregularity or active extravasation. Selective gastroduodenal arteriogram demonstrates hyperemia at the expected location of the duodenal bulb without discrete area active extravasation or vessel irregularity. Following empiric percutaneous coil embolization, there is complete embolization of the gastroduodenal artery. Superior mesenteric arteriogram was negative for definitive retrograde contribution to the gastroduodenal arterial tree. IMPRESSION: Technically successful inferior coil embolization of the gastroduodenal artery for life-threatening upper GI bleed due to gastric/duodenal ulcer. Electronically Signed   By: Sandi Mariscal M.D.   On: 05/20/2018 09:18    Labs:  CBC: Recent Labs    05/20/18 0233  05/20/18 1049 05/20/18 1344 05/20/18 1748 05/21/18 0450  WBC 24.2*  --   --  17.0* 17.8* 16.5*  HGB 14.1   < > 11.6* 11.7* 12.1 11.0*  HCT 45.1   < > 34.0* 35.8* 37.4 32.0*  PLT 288  --   --  229 242 225   < > = values in this interval not displayed.    COAGS: Recent Labs    05/19/18 1839 05/21/18 0450  INR 1.23 1.17  APTT 31  --     BMP: Recent Labs    05/19/18 0700 05/19/18 1839 05/20/18 0233 05/20/18 0256 05/20/18 1049 05/21/18 0000  NA 137 141  141 140 146* 142  K 2.6* 4.3 4.4 4.4 4.3 3.3*  CL 97* 113* 116*  --   --  117*  CO2 22 21* 17*  --   --  18*  GLUCOSE 135* 86 196*  --   --  144*  BUN '16 14 16  ' --   --  23*  CALCIUM 10.3 7.5* 6.6*  --   --  7.1*  CREATININE 1.04* 0.80 1.08*  --   --  1.17*  GFRNONAA 60* >60 57*  --   --  52*  GFRAA >60 >60 >60  --   --  60*    LIVER FUNCTION TESTS: Recent Labs    05/19/18 1839 05/21/18 0450  BILITOT 0.2* 0.6  AST 33 41  ALT 12 13  ALKPHOS 79 65  PROT 5.0* 4.7*  ALBUMIN 1.9* 1.7*    Assessment and Plan:  GDA embolization 2/16 Hg 11 this am; no transfusion since 2/16 RN to inform IR when no need Left groin sheath-- let IR if need  IR to remove Will follow few days  Electronically Signed: Lavonia Drafts, PA-C 05/21/2018, 10:56 AM   I spent a total of 15 Minutes at the the patient's bedside AND on the patient's hospital floor or unit, greater than 50% of which was counseling/coordinating care for GDA embolization

## 2018-05-21 NOTE — Evaluation (Signed)
Clinical/Bedside Swallow Evaluation Patient Details  Name: GARIMA CHRONIS MRN: [id number] Date of Birth: 09-Oct-1960  Today's Date: 05/21/2018 Time: SLP Start Time (ACUTE ONLY): 1941 SLP Stop Time (ACUTE ONLY): 1603 SLP Time Calculation (min) (ACUTE ONLY): 16 min  Past Medical History:  Past Medical History:  Diagnosis Date  . ADD (attention deficit disorder)   . Anxiety   . Bipolar disorder ([place])   . Chronic pain   . Depression   . Narcotic abuse in remission ([place])   . Schizophrenia ([place])   . Seizures ([place])    Past Surgical History:  Past Surgical History:  Procedure Laterality Date  . BACK SURGERY    . ESOPHAGOGASTRODUODENOSCOPY N/A 05/19/2018   Procedure: ESOPHAGOGASTRODUODENOSCOPY (EGD);  Surgeon: Yetta Flock, MD;  Location: [facility] ENDOSCOPY;  Service: Gastroenterology;  Laterality: N/A;  egd at bedside in ICU, intubated  . IR ANGIOGRAM SELECTIVE EACH ADDITIONAL VESSEL  05/20/2018  . IR ANGIOGRAM SELECTIVE EACH ADDITIONAL VESSEL  05/20/2018  . IR ANGIOGRAM VISCERAL SELECTIVE  05/20/2018  . IR ANGIOGRAM VISCERAL SELECTIVE  05/20/2018  . IR EMBO ART  VEN HEMORR LYMPH EXTRAV  INC GUIDE ROADMAPPING  05/20/2018  . IR US GUIDE VASC ACCESS LEFT  05/20/2018   HPI:  58 year old with UGI bleed intubated 2/15-2/17 for endoscopy s/p IR embolization of gastroduodenal artery for large bleeding gastric ulcer. She had bilateral infiltrates and 3 L oxygen requirement on admission, presumptively diagnosed as community-acquired pneumonia. Hx ADD, bipolar, depression, schizophrenia, chronic pain with hx use of NSAIDS.    Assessment / Plan / Recommendation Clinical Impression  Pt presents with an acute reversible dysphagia s/p two-day intubation and compounded by impaired mentation.  Demonstrated concerns for potential aspiration after consumption of thin liquids, with notable and consistent delayed cough. Voice is hoarse. Pt with delayed responses to questions.  Family present.  For today,  continue NPO except occasional ice chips after oral care; meds whole in applesauce.  D/W RN, pt and family.  Anticipate rapid improvement.  SLP Visit Diagnosis: Dysphagia, unspecified (R13.10)    Aspiration Risk       Diet Recommendation   npo except occasional ice chips/meds whole in puree after oral care  Medication Administration: Whole meds with puree    Other  Recommendations Oral Care Recommendations: Oral care BID;Oral care prior to ice chip/H20   Follow up Recommendations Other (comment)(tba)      Frequency and Duration min 2x/week  1 week       Prognosis Prognosis for Safe Diet Advancement: Good      Swallow Study   General Date of Onset: 05/19/18 HPI: 58 year old with UGI bleed intubated 2/15-2/17 for endoscopy s/p IR embolization of gastroduodenal artery for large bleeding gastric ulcer. She had bilateral infiltrates and 3 L oxygen requirement on admission, presumptively diagnosed as community-acquired pneumonia. Hx ADD, bipolar, depression, schizophrenia, chronic pain with hx use of NSAIDS.  Type of Study: Bedside Swallow Evaluation Previous Swallow Assessment: no Diet Prior to this Study: NPO Temperature Spikes Noted: Yes Respiratory Status: Room air History of Recent Intubation: Yes Length of Intubations (days): 2 days Date extubated: 05/21/18 Behavior/Cognition: Lethargic/Drowsy Oral Cavity Assessment: (secretions) Oral Care Completed by SLP: Yes Oral Cavity - Dentition: Adequate natural dentition Self-Feeding Abilities: Needs assist Patient Positioning: Upright in bed Baseline Vocal Quality: Hoarse Volitional Cough: Strong Volitional Swallow: Able to elicit    Oral/Motor/Sensory Function Overall Oral Motor/Sensory Function: Within functional limits   Ice Chips Ice chips: Within functional limits Presentation: Spoon   Thin  Liquid Thin Liquid: Impaired Presentation: Cup;Straw Pharyngeal  Phase Impairments: Wet Vocal Quality;Cough - Delayed    Nectar  Thick Nectar Thick Liquid: Not tested   Honey Thick Honey Thick Liquid: Not tested   Puree Puree: Within functional limits Presentation: Spoon   Solid     Solid: Not tested      [name] 05/21/2018,4:13 PM  Estill Bamberg L. Tivis Ringer, [place] Office number [phone] Pager [phone]

## 2018-05-21 NOTE — Progress Notes (Addendum)
NAME:  Christine Ortega, MRN:  [id number], DOB:  Jul 19, 1960, LOS: 2 ADMISSION DATE:  05/19/2018, CONSULTATION DATE: 2 15 2020 REFERRING MD: GI services, CHIEF COMPLAINT: GI bleed, pneumonia, blood loss anemia shock  Brief History   58 year old with UGI bleed  intubated for endoscopy s/p IR embolization of gastroduodenal artery for large bleeding gastric ulcer She had bilateral infiltrates and 3 L oxygen requirement on admission, presumptively diagnosed as community-acquired pneumonia   Past Medical History  Attention deficit disorder Schizophrenia Bipolar History of chronic opioid abuse History of tobacco abuse Overuse of NSAIDs   Significant Hospital Events   05/19/2018 due to excessive GI bleeding transferred to intensive care unit with plan for endoscopy. 2/15 - Flu PCR - neg 2/16 >> change from Precedex to propofol  Consults:  05/19/2018 pulmonary critical care 05/19/2018 GI and IR  Procedures:  05/19/2018 intubation  Significant Diagnostic Tests:  05/19/2018 endoscopy>>  - Normal esophagus. - Red blood in the entire stomach with significant clot burden - as above time not taken to clear the entire stomach due to obvious pathology at the antrum. It is possible there is pathology in the fundus / proximal stomach to cause bleeding but would not have been visualized on this exam. - Giant gastric ulcer at the antrum entering duodenal bulb - not amenable to endoscopic therapy given appearance and poor ability to visualize the area (of note, hemospray currently not available due to FDA recall). The procedure was aborted due to tenous status with need for more definitive therapy - Blood in the entire examined duodenum. This patient is having life threatening bleeding presumed due to giant gastric ulcer which did not appear amenable to endoscopic therapy. IR was called for attempt at embolization.   2./16 - Post mesenteric arteriogram and empiric embolization of the GDA.      Micro Data:  05/19/2018 blood cultures x2>> ng 05/19/2018 urine culture >> GNR >> 05/19/2018 sputum culture >> 2/15 - flu pcr - neg  Antimicrobials:  05/19/2018 ceftriaxone 05/19/2018 Zithromax  Interim history/subjective:      Objective   Blood pressure 119/74, pulse 60, temperature 98.1 F (36.7 C), resp. rate (!) 26, height 5\' 3"  (1.6 m), weight 67.2 kg, SpO2 100 %.    Vent Mode: PRVC FiO2 (%):  [40 %] 40 % Set Rate:  [26 bmp] 26 bmp Vt Set:  [480 mL] 480 mL PEEP:  [5 cmH20] 5 cmH20 Plateau Pressure:  [20 cmH20-31 cmH20] 31 cmH20   Intake/Output Summary (Last 24 hours) at 05/21/2018 1012 Last data filed at 05/21/2018 1000 Gross per 24 hour  Intake 2278.66 ml  Output 975 ml  Net 1303.66 ml   Filed Weights   05/19/18 0650 05/21/18 0308  Weight: 63.5 kg 67.2 kg   Critically ill appearing, intubated and sedated No acute distress Mild pallor, no icterus, no JVD or cervical lymphadenopathy Crackles right base, no rhonchi Soft and nontender abdomen S1-S2 normal 1+ bipedal edema RA SS -5 on propofol    Chest x-ray from 2/15 personally reviewed which shows bilateral reticulonodular infiltrates, ET tube in position  Resolved Hospital Problem list     Assessment & Plan:  Shock secondary to blood loss anemia with hemoglobin noted be as low as 4.1.  In the setting of a GI bleed.  With multiple melena stools.  history of use of NSAIDs for chronic pain. S/p 5 U PROBC and 2/16 - embolization of GDA   Plan- -  PRBC if hgb  < 7gm% or  for volume resus - Protonix infusion for 72h from 2/15    Acute respiratory failure secondary to blood loss anemia, shock and need to perform endoscopy. Suspected pneumonia v aspiration of blood v doubt ILD  PLAN SBT's/W UA with goal extubation   History attention deficit disorder, bipolar, depression, schizophrenia chronic pain Agitated delirium on vent  -Resume home depakote, flexeril - monitor QTc  - change precedex to  diprivan  - fent prn - versed prn -Geodon IM x 1 dose to facilitate extubation   Hypokalemia will be repleted    QTc prolongation - 577 on 2/16  Concern for CAP v UTI - continue ceftx, dc azithro whiel awaiting cx    Best practice:  Diet: N.p.o. Pain/Anxiety/Delirium protocol (if indicated): Sedation when intubated for tube tolerance VAP protocol (if indicated): Positive DVT prophylaxis: Compression hose no anticoagulation with GI bleed GI prophylaxis: PPI drip Glucose control: Sliding scale insulin Mobility: Bedrest Code Status: Full Family Communication: niece at bedside 2/17  Disposition: ICU  Summary-due to her extreme agitation, will use 1 dose of Geodon, watch for QT prolongation and hopefully this will facilitate extubation Bilateral infiltrates appear to be acute rather than ILD but will follow evolution  The patient is critically ill with multiple organ systems failure and requires high complexity decision making for assessment and support, frequent evaluation and titration of therapies, application of advanced monitoring technologies and extensive interpretation of multiple databases. Critical Care Time devoted to patient care services described in this note independent of APP/resident  time is 35 minutes.   Kara Mead MD. Shade Flood. [place] Pager [phone] If no response call [phone]     05/21/2018 10:12 AM

## 2018-05-21 NOTE — Procedures (Signed)
Extubation Procedure Note  Patient Details:   Name: KAEDYNCE TAPP DOB: 1960-08-31 MRN: [id number]   Airway Documentation:    Vent end date: 05/21/18 Vent end time: 1100   Evaluation  O2 sats: stable throughout Complications: No apparent complications Patient did tolerate procedure well. Bilateral Breath Sounds: Expiratory wheezes, Diminished   Yes   Patient extubated to 3L [place] without complications. Positive cuff leak noted. RN at bedside. Clear bilateral breath sounds. Will continue to monitor.  Herbie Baltimore 05/21/2018, 11:10 AM

## 2018-05-22 ENCOUNTER — Other Ambulatory Visit (HOSPITAL_COMMUNITY): Payer: Self-pay

## 2018-05-22 ENCOUNTER — Inpatient Hospital Stay (HOSPITAL_COMMUNITY): Payer: Medicare Other

## 2018-05-22 LAB — POCT I-STAT 7, (LYTES, BLD GAS, ICA,H+H)
Acid-Base Excess: 1 mmol/L (ref 0.0–2.0)
Acid-Base Excess: 4 mmol/L — ABNORMAL HIGH (ref 0.0–2.0)
Bicarbonate: 25.7 mmol/L (ref 20.0–28.0)
Bicarbonate: 31.1 mmol/L — ABNORMAL HIGH (ref 20.0–28.0)
Calcium, Ion: 1.08 mmol/L — ABNORMAL LOW (ref 1.15–1.40)
Calcium, Ion: 1.04 mmol/L — ABNORMAL LOW (ref 1.15–1.40)
HCT: 26 % — ABNORMAL LOW (ref 36.0–46.0)
HCT: 26 % — ABNORMAL LOW (ref 36.0–46.0)
Hemoglobin: 8.8 g/dL — ABNORMAL LOW (ref 12.0–15.0)
Hemoglobin: 8.8 g/dL — ABNORMAL LOW (ref 12.0–15.0)
O2 Saturation: 98 %
O2 Saturation: 100 %
PCO2 ART: 61.3 mmHg — AB (ref 32.0–48.0)
PH ART: 7.313 — AB (ref 7.350–7.450)
Patient temperature: 99.5
Patient temperature: 98.6
Potassium: 2.9 mmol/L — ABNORMAL LOW (ref 3.5–5.1)
Potassium: 3.1 mmol/L — ABNORMAL LOW (ref 3.5–5.1)
Sodium: 145 mmol/L (ref 135–145)
Sodium: 142 mmol/L (ref 135–145)
TCO2: 27 mmol/L (ref 22–32)
TCO2: 33 mmol/L — ABNORMAL HIGH (ref 22–32)
pCO2 arterial: 43.3 mmHg (ref 32.0–48.0)
pH, Arterial: 7.384 (ref 7.350–7.450)
pO2, Arterial: 108 mmHg (ref 83.0–108.0)
pO2, Arterial: 324 mmHg — ABNORMAL HIGH (ref 83.0–108.0)

## 2018-05-22 LAB — CBC WITH DIFFERENTIAL/PLATELET
Abs Immature Granulocytes: 0.09 10*3/uL — ABNORMAL HIGH (ref 0.00–0.07)
Basophils Absolute: 0 10*3/uL (ref 0.0–0.1)
Basophils Relative: 0 %
EOS ABS: 0.2 10*3/uL (ref 0.0–0.5)
EOS PCT: 1 %
HCT: 29.9 % — ABNORMAL LOW (ref 36.0–46.0)
Hemoglobin: 9.5 g/dL — ABNORMAL LOW (ref 12.0–15.0)
Immature Granulocytes: 1 %
Lymphocytes Relative: 12 %
Lymphs Abs: 1.8 10*3/uL (ref 0.7–4.0)
MCH: 28.3 pg (ref 26.0–34.0)
MCHC: 31.8 g/dL (ref 30.0–36.0)
MCV: 89 fL (ref 80.0–100.0)
Monocytes Absolute: 1 10*3/uL (ref 0.1–1.0)
Monocytes Relative: 7 %
Neutro Abs: 12 10*3/uL — ABNORMAL HIGH (ref 1.7–7.7)
Neutrophils Relative %: 79 %
Platelets: 172 10*3/uL (ref 150–400)
RBC: 3.36 MIL/uL — ABNORMAL LOW (ref 3.87–5.11)
RDW: 17.6 % — AB (ref 11.5–15.5)
WBC: 15.1 10*3/uL — ABNORMAL HIGH (ref 4.0–10.5)
nRBC: 0.2 % (ref 0.0–0.2)

## 2018-05-22 LAB — BASIC METABOLIC PANEL
Anion gap: 8 (ref 5–15)
BUN: 13 mg/dL (ref 6–20)
CALCIUM: 7.3 mg/dL — AB (ref 8.9–10.3)
CO2: 23 mmol/L (ref 22–32)
Chloride: 113 mmol/L — ABNORMAL HIGH (ref 98–111)
Creatinine, Ser: 0.82 mg/dL (ref 0.44–1.00)
GFR calc Af Amer: >60 mL/min (ref >60)
GFR calc non Af Amer: >60 mL/min (ref >60)
GLUCOSE: 98 mg/dL (ref 70–99)
Potassium: 3.1 mmol/L — ABNORMAL LOW (ref 3.5–5.1)
Sodium: 144 mmol/L (ref 135–145)

## 2018-05-22 LAB — CBC
HCT: 30.7 % — ABNORMAL LOW (ref 36.0–46.0)
HCT: 29.9 % — ABNORMAL LOW (ref 36.0–46.0)
Hemoglobin: 9.6 g/dL — ABNORMAL LOW (ref 12.0–15.0)
Hemoglobin: 9.3 g/dL — ABNORMAL LOW (ref 12.0–15.0)
MCH: 28.2 pg (ref 26.0–34.0)
MCH: 28.3 pg (ref 26.0–34.0)
MCHC: 31.3 g/dL (ref 30.0–36.0)
MCHC: 31.1 g/dL (ref 30.0–36.0)
MCV: 90.3 fL (ref 80.0–100.0)
MCV: 90.9 fL (ref 80.0–100.0)
Platelets: 166 10*3/uL (ref 150–400)
Platelets: 147 10*3/uL — ABNORMAL LOW (ref 150–400)
RBC: 3.4 MIL/uL — ABNORMAL LOW (ref 3.87–5.11)
RBC: 3.29 MIL/uL — ABNORMAL LOW (ref 3.87–5.11)
RDW: 17.7 % — ABNORMAL HIGH (ref 11.5–15.5)
RDW: 17.7 % — AB (ref 11.5–15.5)
WBC: 15.3 10*3/uL — ABNORMAL HIGH (ref 4.0–10.5)
WBC: 14.5 10*3/uL — ABNORMAL HIGH (ref 4.0–10.5)
nRBC: 0.3 % — ABNORMAL HIGH (ref 0.0–0.2)
nRBC: 0.1 % (ref 0.0–0.2)

## 2018-05-22 LAB — MAGNESIUM: Magnesium: 1.4 mg/dL — ABNORMAL LOW (ref 1.7–2.4)

## 2018-05-22 LAB — GLUCOSE, CAPILLARY
Glucose-Capillary: 109 mg/dL — ABNORMAL HIGH (ref 70–99)
Glucose-Capillary: 115 mg/dL — ABNORMAL HIGH (ref 70–99)
Glucose-Capillary: 101 mg/dL — ABNORMAL HIGH (ref 70–99)
Glucose-Capillary: 99 mg/dL (ref 70–99)

## 2018-05-22 LAB — PATHOLOGIST SMEAR REVIEW

## 2018-05-22 LAB — PHOSPHORUS: Phosphorus: 2.3 mg/dL — ABNORMAL LOW (ref 2.5–4.6)

## 2018-05-22 LAB — H. PYLORI ANTIBODY, IGG: H Pylori IgG: 0.81 Index Value — ABNORMAL HIGH (ref 0.00–0.79)

## 2018-05-22 LAB — URINE CULTURE

## 2018-05-22 LAB — PROCALCITONIN: Procalcitonin: 1.48 ng/mL

## 2018-05-22 LAB — TROPONIN I: Troponin I: 0.95 ng/mL (ref <0.03)

## 2018-05-22 LAB — CALCIUM, IONIZED: Calcium, Ionized, Serum: 4.6 mg/dL (ref 4.5–5.6)

## 2018-05-22 MED ORDER — FENTANYL 2500MCG IN NS 250ML (10MCG/ML) PREMIX INFUSION: 0.0000 ug/h | INTRAVENOUS | Status: DC

## 2018-05-22 MED ORDER — ROCURONIUM BROMIDE 50 MG/5ML IV SOSY
70.0000 mg | PREFILLED_SYRINGE | Freq: Once | INTRAVENOUS | Status: AC
  Administered 2018-05-22: 70 mg via INTRAVENOUS
  Filled 2018-05-22: qty 10

## 2018-05-22 MED ORDER — FENTANYL CITRATE (PF) 100 MCG/2ML IJ SOLN
INTRAMUSCULAR | Status: AC
  Filled 2018-05-22: qty 4

## 2018-05-22 MED ORDER — SODIUM CHLORIDE 0.9 % IV BOLUS
500.0000 mL | Freq: Once | INTRAVENOUS | Status: AC
  Administered 2018-05-22: 500 mL via INTRAVENOUS

## 2018-05-22 MED ORDER — MIDAZOLAM HCL 2 MG/2ML IJ SOLN
2.0000 mg | INTRAMUSCULAR | Status: DC | PRN
  Administered 2018-05-23: 2 mg via INTRAVENOUS
  Filled 2018-05-22: qty 2

## 2018-05-22 MED ORDER — FLUOXETINE HCL 20 MG PO CAPS: 40.0000 mg | ORAL_CAPSULE | Freq: Every day | ORAL | Status: DC

## 2018-05-22 MED ORDER — ACETAMINOPHEN 650 MG RE SUPP
650.0000 mg | RECTAL | Status: DC | PRN
  Administered 2018-05-23 (×2): 650 mg via RECTAL
  Filled 2018-05-22 (×2): qty 1

## 2018-05-22 MED ORDER — MIDAZOLAM HCL 2 MG/2ML IJ SOLN
2.0000 mg | Freq: Once | INTRAMUSCULAR | Status: AC
  Administered 2018-05-22: 2 mg via INTRAVENOUS

## 2018-05-22 MED ORDER — FENTANYL CITRATE (PF) 100 MCG/2ML IJ SOLN
100.0000 ug | Freq: Once | INTRAMUSCULAR | Status: AC
  Administered 2018-05-22: 100 ug via INTRAVENOUS

## 2018-05-22 MED ORDER — ACETAMINOPHEN 650 MG RE SUPP
650.0000 mg | Freq: Once | RECTAL | Status: AC
  Administered 2018-05-22: 650 mg via RECTAL
  Filled 2018-05-22: qty 1

## 2018-05-22 MED ORDER — ETOMIDATE 2 MG/ML IV SOLN
20.0000 mg | Freq: Once | INTRAVENOUS | Status: AC
  Administered 2018-05-22: 20 mg via INTRAVENOUS

## 2018-05-22 MED ORDER — POTASSIUM CHLORIDE CRYS ER 20 MEQ PO TBCR: 40.0000 meq | EXTENDED_RELEASE_TABLET | Freq: Once | ORAL | Status: DC

## 2018-05-22 MED ORDER — POTASSIUM CHLORIDE 10 MEQ/50ML IV SOLN
10.0000 meq | INTRAVENOUS | Status: DC
  Administered 2018-05-22: 10 meq via INTRAVENOUS
  Filled 2018-05-22 (×2): qty 50

## 2018-05-22 MED ORDER — MIDAZOLAM HCL 2 MG/2ML IJ SOLN
2.0000 mg | INTRAMUSCULAR | Status: DC | PRN
  Administered 2018-05-23 (×2): 2 mg via INTRAVENOUS
  Filled 2018-05-22 (×2): qty 2

## 2018-05-22 MED ORDER — FENTANYL BOLUS VIA INFUSION
50.0000 ug | INTRAVENOUS | Status: DC | PRN
  Administered 2018-05-22 – 2018-05-23 (×2): 50 ug via INTRAVENOUS
  Filled 2018-05-22: qty 50

## 2018-05-22 MED ORDER — MIDAZOLAM HCL 2 MG/2ML IJ SOLN
INTRAMUSCULAR | Status: AC
  Administered 2018-05-22: 2 mg via INTRAVENOUS
  Filled 2018-05-22: qty 2

## 2018-05-22 MED ORDER — MAGNESIUM SULFATE 4 GM/100ML IV SOLN
4.0000 g | Freq: Once | INTRAVENOUS | Status: AC
  Administered 2018-05-22: 4 g via INTRAVENOUS
  Filled 2018-05-22: qty 100

## 2018-05-22 MED ORDER — FENTANYL CITRATE (PF) 100 MCG/2ML IJ SOLN
INTRAMUSCULAR | Status: AC
  Administered 2018-05-22: 100 ug via INTRAVENOUS
  Filled 2018-05-22: qty 2

## 2018-05-22 MED ORDER — MAGNESIUM SULFATE 2 GM/50ML IV SOLN: 2.0000 g | Freq: Once | INTRAVENOUS | Status: DC

## 2018-05-22 MED ORDER — MIDAZOLAM HCL 2 MG/2ML IJ SOLN
INTRAMUSCULAR | Status: AC
  Filled 2018-05-22: qty 2

## 2018-05-22 MED ORDER — FUROSEMIDE 10 MG/ML IJ SOLN
40.0000 mg | Freq: Once | INTRAMUSCULAR | Status: AC
  Administered 2018-05-22: 40 mg via INTRAVENOUS
  Filled 2018-05-22: qty 4

## 2018-05-22 MED ORDER — ZIPRASIDONE HCL 80 MG PO CAPS
80.0000 mg | ORAL_CAPSULE | Freq: Every day | ORAL | Status: DC
  Filled 2018-05-22 (×2): qty 1

## 2018-05-22 MED ORDER — POTASSIUM PHOSPHATES 15 MMOLE/5ML IV SOLN
30.0000 mmol | Freq: Once | INTRAVENOUS | Status: AC
  Administered 2018-05-22: 30 mmol via INTRAVENOUS
  Filled 2018-05-22: qty 10

## 2018-05-22 MED ORDER — DEXMEDETOMIDINE HCL IN NACL 400 MCG/100ML IV SOLN
0.4000 ug/kg/h | INTRAVENOUS | Status: DC
  Administered 2018-05-22: 0.4 ug/kg/h via INTRAVENOUS

## 2018-05-22 MED ORDER — FENTANYL 2500MCG IN NS 250ML (10MCG/ML) PREMIX INFUSION
25.0000 ug/h | INTRAVENOUS | Status: DC
  Administered 2018-05-22: 25 ug/h via INTRAVENOUS
  Administered 2018-05-23 (×2): 400 ug/h via INTRAVENOUS
  Filled 2018-05-22 (×4): qty 250

## 2018-05-22 MED ORDER — POTASSIUM CHLORIDE 10 MEQ/100ML IV SOLN
10.0000 meq | INTRAVENOUS | Status: AC
  Administered 2018-05-22 (×4): 10 meq via INTRAVENOUS
  Filled 2018-05-22 (×4): qty 100

## 2018-05-22 MED ORDER — FENTANYL CITRATE (PF) 100 MCG/2ML IJ SOLN
50.0000 ug | Freq: Once | INTRAMUSCULAR | Status: AC
  Administered 2018-05-22: 50 ug via INTRAVENOUS

## 2018-05-22 NOTE — Progress Notes (Signed)
PT Cancellation Note  Patient Details Name: Christine Ortega MRN: [id number] DOB: 10/24/1960   Cancelled Treatment:    Reason Eval/Treat Not Completed: Medical issues which prohibited therapy.  Pt's respiratory status is tenuous.  She is currently on NRB unable to tolerate BiPAP.  RN reports she may need re-intubation.  Holding PT today.  PT will check back tomorrow.    Barbarann Ehlers Cadell Gabrielson, PT, DPT  Acute Rehabilitation [phone] pager [phone]) [phone] office     Barbarann Ehlers Alyaan Budzynski 05/22/2018, 3:18 PM

## 2018-05-22 NOTE — Progress Notes (Signed)
Patients ETT tube pulled back 3 cm per order. ETT tube was at 25 and is now at 32.

## 2018-05-22 NOTE — Progress Notes (Addendum)
NAME:  Christine Ortega, MRN:  [id number], DOB:  February 18, 1961, LOS: 3 ADMISSION DATE:  05/19/2018, CONSULTATION DATE: 2 15 2020 REFERRING MD: GI services, CHIEF COMPLAINT: GI bleed, pneumonia, blood loss anemia shock  Brief History   58 year old with UGI bleed  intubated for endoscopy s/p IR embolization of gastroduodenal artery for large bleeding gastric ulcer She had bilateral infiltrates and 3 L oxygen requirement on admission, presumptively diagnosed as community-acquired pneumonia  Past Medical History  Attention deficit disorder Schizophrenia Bipolar History of chronic opioid abuse History of tobacco abuse Overuse of NSAIDs  Significant Hospital Events   05/19/2018 due to excessive GI bleeding transferred to intensive care unit with plan for endoscopy. 2/15 - Flu PCR - neg 2/16 >> change from Precedex to propofol; extubated  Consults:  05/19/2018 pulmonary critical care 05/19/2018 GI and IR  Procedures:  2/15 ETT >> 2/16 2/15 R femoral cordis >>2/18 -pt pulled out 2/15 left femoral aline >>  Significant Diagnostic Tests:  05/19/2018 endoscopy>>  - Normal esophagus. - Red blood in the entire stomach with significant clot burden - as above time not taken to clear the entire stomach due to obvious pathology at the antrum. It is possible there is pathology in the fundus / proximal stomach to cause bleeding but would not have been visualized on this exam. - Giant gastric ulcer at the antrum entering duodenal bulb - not amenable to endoscopic therapy given appearance and poor ability to visualize the area (of note, hemospray currently not available due to FDA recall). The procedure was aborted due to tenous status with need for more definitive therapy - Blood in the entire examined duodenum. This patient is having life threatening bleeding presumed due to giant gastric ulcer which did not appear amenable to endoscopic therapy. IR was called for attempt at embolization.  2./16 -  Post mesenteric arteriogram and empiric embolization of the GDA.    Micro Data:  05/19/2018 blood cultures x2>> ng 05/19/2018 urine culture >>  >100k e.Coli >> 05/19/2018 sputum culture >> 2/15 RVP - neg  Antimicrobials:  05/19/2018 ceftriaxone >> 05/19/2018 Zithromax >>  Interim history/subjective:  Worsening respiratory distress this morning with worsening bilateral inflitrates on CXR, s/p lasix 40mg .  Did not tolerate BiPAP.  Pulled out femoral cordis- no significant bleeding, currently without IV access.  Ongoing resp distress on NRB, keeping SpO2 up and maintaining mental status for now. Febrile Net + 8.9L   Objective   Blood pressure 98/80, pulse (!) 130, temperature 99.5 F (37.5 C), resp. rate (!) 32, height 5\' 3"  (1.6 m), weight 67.2 kg, SpO2 95 %.    FiO2 (%):  [40 %] 40 %   Intake/Output Summary (Last 24 hours) at 05/22/2018 0758 Last data filed at 05/22/2018 0600 Gross per 24 hour  Intake 2793.65 ml  Output 2460 ml  Net 333.65 ml   Filed Weights   05/19/18 0650 05/21/18 0308  Weight: 63.5 kg 67.2 kg   General:  Acutely ill appearing female sitting upright in bed in resp distress HEENT: MM pink/moist, pupils 5/ reactive, +JVD Neuro: Awake, oriented x 3, MAE CV: ST, not able to hear heart sounds due to diffuse rales PULM: tachypneic 30's, moderate respiratory distress, diffuse rales, on NRB at 96% GI: soft, non-tender, bs active  Extremities: warm/dry, generalized edema +2, RLE slightly more edematous without warmth or erythema   Skin: no rashes  Chest x-ray from 2/15 personally reviewed which shows bilateral reticulonodular infiltrates, ET tube in position  Resolved Hospital Problem  list     Assessment & Plan:  Acute hypoxic respiratory failure Suspected CAP vs aspiration, worsening CXR concerning for ARDS pulmonary edema +/- TRALI/ TACO - multifactorial related to ABLA, shock, procedure - extubated 2/17 P:  Continue NRB for now, as patient did not tolerate  BiPAP due to agitation S/p Lasix 40 mg x 1, consider additional diuresis this evening after K repleted Establishing PIV access as patient pulled out ABG pending, but will likely need intubation  Following trach culture from 2/16 Continuing empiric azithro/ ceftriaxone Assess PCT  D/c IVF  Trend renal function/ UOP- adequete / normal thus far   Hemorrhagic shock secondary to UGIB ABLA  - resolved  - s/p 7 units PRBC, Hgb 4.1 >> 11 >> now stable 9.5 P:  Goal MAP > 65, has been hemodynamically stable Hgb trend stable/ monitor/ trend CBC Transfuse for hgb < 7  Acute UGIB - found to have bleeding gastric ulcer s/p IR embolization of GDA - hx NSAIDS use for chronic pain P:  GI following CBC q 12 PPI gtt to finish at 1230 and change to BID  Pending H. Pylori ab  History attention deficit disorder, bipolar, depression, schizophrenia chronic pain Acute Delirium  P:  Monitoring QTc  Precedex prn  Continue home depakote   Hypokalemia / hypomag/ hypophos P:  Kphos 30 mmol x 1 Additional KCL 40 meq this evening  Mag 4 gm x 1 Repeat renal / mag in am   QTc prolongation P:  Tele/ QTc monitoring   Leukocytosis/ fever - suspected CAP +/- UTI P:  Awaiting culture data Continue azithro/ ceftriaxone   Elevated trop P:  Repeat troponin Check TTE   Best practice:  Diet: NPO Pain/Anxiety/Delirium protocol (if indicated): precedex  VAP protocol (if indicated): n/a DVT prophylaxis: SCD- no anticoagulation with GI bleed GI prophylaxis: PPI drip Glucose control: cbg q 4 Mobility: Bedrest Code Status: Full Family Communication: niece at bedside 2/17.  No family at bedside 2/18 Disposition: ICU   CCT 35 mins  Kennieth Rad, MSN, AGACNP-BC [facility] Pgr: [phone] or if no answer [phone] 05/22/2018, 8:33 AM

## 2018-05-22 NOTE — Progress Notes (Signed)
ABG obtained after intubation.  Sample obtained on settings of tidal volume of 420, respiratory rate of 24, PEEP of 5.0, and FIO2 of 100%.  Decreased FIO2 to 40%.  Will continue to monitor.    Ref. Range 05/22/2018 17:43  Sample type Unknown ARTERIAL  pH, Arterial Latest Ref Range: 7.350 - 7.450  7.313 (L)  pCO2 arterial Latest Ref Range: 32.0 - 48.0 mmHg 61.3 (H)  pO2, Arterial Latest Ref Range: 83.0 - 108.0 mmHg 324.0 (H)  TCO2 Latest Ref Range: 22 - 32 mmol/L 33 (H)  Acid-Base Excess Latest Ref Range: 0.0 - 2.0 mmol/L 4.0 (H)  Bicarbonate Latest Ref Range: 20.0 - 28.0 mmol/L 31.1 (H)  O2 Saturation Latest Units: % 100.0  Patient temperature Unknown 98.6 F  Collection site Unknown ARTERIAL LINE

## 2018-05-22 NOTE — Procedures (Signed)
Intubation Procedure Note Christine Ortega [id number] 12/12/1960  Procedure: Intubation Indications: Respiratory insufficiency  Procedure Details Consent: Risks of procedure as well as the alternatives and risks of each were explained to the (patient/caregiver).  Consent for procedure obtained. Time Out: Verified patient identification, verified procedure, site/side was marked, verified correct patient position, special equipment/implants available, medications/allergies/relevent history reviewed, required imaging and test results available.  Performed  Drugs Etomidate 20mg , Versed 2mg , Fentanyl 110mcg IV DL x 1 with GS 3 blade Grade 1 view 7.5 ET tube passed through cords under direct visualization Placement confirmed with bilateral breath sounds, positive EtCO2 change and smoke in tube   Evaluation Hemodynamic Status: BP stable throughout; O2 sats: stable throughout Patient's Current Condition: stable Complications: No apparent complications Patient did tolerate procedure well. Chest X-ray ordered to verify placement.  CXR: pending.   Christine Ortega 05/22/2018

## 2018-05-22 NOTE — Progress Notes (Addendum)
PIV consult: Noted edema and erythema in LUE. Assessed L forearm with [place]: Lower cephalic non-compressible. Cori, RN made aware. Two superficial sites established in R forearm. Pt may benefit from PICC line if she continues to need multiple IV meds.

## 2018-05-22 NOTE — Progress Notes (Signed)
[facility]  Ongoing respiratory failure despite diuresis Discussed with patient and daughter  Plan to proceed with intubation  Roselie Awkward, MD [facility] Pager: [phone] Cell: [phone] If no response, call [phone]

## 2018-05-22 NOTE — Progress Notes (Signed)
Patient was breathing and tolerating HFNC throughout shift, however, towards end of shift she began to cough more often than usual with sats in the 80's and breathing labored. HR in the 150's and high blood pressure due to patient being agitated and not compliant. ELink MD notified and gave orders to place patient on Bipap and give 40 of lasix. Ground team is also at bedside. Patient is not tolerating Bipap and keeps pulling mask off. Precedex ordered and started to help patient comply with treatment.

## 2018-05-22 NOTE — Progress Notes (Signed)
Fio2 increased to 60%, due to sp02 dropped to 88%.

## 2018-05-22 NOTE — Progress Notes (Signed)
[facility] ADULT ICU REPLACEMENT PROTOCOL FOR AM LAB REPLACEMENT ONLY  The patient does apply for the [facility] Adult ICU Electrolyte Replacment Protocol based on the criteria listed below:   1. Is GFR >/= 40 ml/min? Yes.    Patient's GFR today is >60 2. Is urine output >/= 0.5 ml/kg/hr for the last 6 hours? Yes.   Patient's UOP is 1.2 ml/kg/hr 3. Is BUN < 60 mg/dL? Yes.    Patient's BUN today is 13 4. Abnormal electrolyte(s): K-3.1 5. Ordered repletion with: per protocol 6. If a panic level lab has been reported, has the CCM MD in charge been notified? Yes.  .   Physician:  Dr. Terrill Mohr, Philis Nettle 05/22/2018 5:43 AM

## 2018-05-22 NOTE — Progress Notes (Signed)
I came to assess the patient due to desaturation. Now on BIPAP on 60% with Pox 97% but patient is pulling the mask. Just received IV lasix  CXR ordered but I can not pull it up and no report.  D/w bedside nurse start precedex and repeat ABG in an hour Keep on BIPAP

## 2018-05-22 NOTE — Progress Notes (Signed)
eLink Physician-Brief Progress Note Patient Name: Christine Ortega DOB: 1961-03-27 MRN: [id number]   Date of Service  05/22/2018  HPI/Events of Note  Patient desaturated into 70's with coughing. Very slow to recover. Sat now = 89. CXR looks "wet".   eICU Interventions  Will order: 1. Lasix 40 mg IV X 1 now.  2. BiPAP - IPAP 12/EPAP 5. 3. Will ask ground team to evaluate.      Intervention Category Major Interventions: Hypoxemia - evaluation and management  Lavaeh Bau [place] 05/22/2018, 6:08 AM

## 2018-05-22 NOTE — Progress Notes (Signed)
Notified NP, Jennelle Human, of IV team's assessment of the non-compressible left cephalic vein. Will continue to monitor.  Dewaine Oats, RN

## 2018-05-22 NOTE — Progress Notes (Signed)
  Speech Language Pathology Treatment: Dysphagia  Patient Details Name: Christine Ortega MRN: [id number] DOB: 04-01-1961 Today's Date: 05/22/2018 Time: 1470-9295 SLP Time Calculation (min) (ACUTE ONLY): 8 min  Assessment / Plan / Recommendation Clinical Impression  Patient seen for f/u diagnostic treatment with goal to determine readiness for pos. Patient alert but with increased WOB. Noted decline in respiratory status beginning early this am. Patient initially on venti-mask upon arrival to room however required transition to non-rebreather as unable to maintain O2 above 85%. Oral care complete by SLP to facilitate safety with po trials and increase ability to complete dry swallows. Despite max cueing however, patient unable to initiate a swallow, vocal quality remaining hoarse and intermittently wet indicative of continued decrease in glottal closures s/p intubation and decreased secretion management. Given current decline in respiratory status as compared to evaluation complete 2/17 and poor swallowing function, continue to recommend NPO, including meds.  Prognosis for ability to resume pos remains good with improved respiratory function, mentation, and continued time off vent.    HPI HPI: 58 year old with UGI bleed intubated 2/15-2/17 for endoscopy s/p IR embolization of gastroduodenal artery for large bleeding gastric ulcer. She had bilateral infiltrates and 3 L oxygen requirement on admission, presumptively diagnosed as community-acquired pneumonia. Hx ADD, bipolar, depression, schizophrenia, chronic pain with hx use of NSAIDS.       SLP Plan  Continue with current plan of care       Recommendations  Diet recommendations: NPO                Oral Care Recommendations: Oral care QID Follow up Recommendations: Other (comment)(tba) SLP Visit Diagnosis: Dysphagia, unspecified (R13.10) Plan: Continue with current plan of care       [facility] MA, [place] 05/22/2018, 9:20 AM

## 2018-05-23 ENCOUNTER — Inpatient Hospital Stay (HOSPITAL_COMMUNITY): Payer: Medicare Other

## 2018-05-23 ENCOUNTER — Inpatient Hospital Stay: Payer: Self-pay

## 2018-05-23 ENCOUNTER — Encounter (HOSPITAL_COMMUNITY): Payer: Self-pay

## 2018-05-23 DIAGNOSIS — I361 Nonrheumatic tricuspid (valve) insufficiency: Secondary | ICD-10-CM

## 2018-05-23 DIAGNOSIS — D473 Essential (hemorrhagic) thrombocythemia: Secondary | ICD-10-CM

## 2018-05-23 LAB — TYPE AND SCREEN
ABO/RH(D): O POS
ABO/RH(D): O POS
ANTIBODY SCREEN: NEGATIVE
Antibody Screen: NEGATIVE
UNIT DIVISION: 0
Unit division: 0
Unit division: 0
Unit division: 0
Unit division: 0
Unit division: 0
Unit division: 0
Unit division: 0
Unit division: 0

## 2018-05-23 LAB — POCT I-STAT 7, (LYTES, BLD GAS, ICA,H+H)
Acid-Base Excess: 2 mmol/L (ref 0.0–2.0)
Acid-Base Excess: 1 mmol/L (ref 0.0–2.0)
Bicarbonate: 28.3 mmol/L — ABNORMAL HIGH (ref 20.0–28.0)
Bicarbonate: 29.6 mmol/L — ABNORMAL HIGH (ref 20.0–28.0)
CALCIUM ION: 1.08 mmol/L — AB (ref 1.15–1.40)
Calcium, Ion: 1.13 mmol/L — ABNORMAL LOW (ref 1.15–1.40)
HCT: 26 % — ABNORMAL LOW (ref 36.0–46.0)
HCT: 25 % — ABNORMAL LOW (ref 36.0–46.0)
Hemoglobin: 8.8 g/dL — ABNORMAL LOW (ref 12.0–15.0)
Hemoglobin: 8.5 g/dL — ABNORMAL LOW (ref 12.0–15.0)
O2 SAT: 95 %
O2 Saturation: 97 %
PH ART: 7.302 — AB (ref 7.350–7.450)
Patient temperature: 38
Patient temperature: 38.4
Potassium: 3.4 mmol/L — ABNORMAL LOW (ref 3.5–5.1)
Potassium: 3.8 mmol/L (ref 3.5–5.1)
Sodium: 145 mmol/L (ref 135–145)
Sodium: 145 mmol/L (ref 135–145)
TCO2: 30 mmol/L (ref 22–32)
TCO2: 32 mmol/L (ref 22–32)
pCO2 arterial: 57.8 mmHg — ABNORMAL HIGH (ref 32.0–48.0)
pCO2 arterial: 74.2 mmHg (ref 32.0–48.0)
pH, Arterial: 7.216 — ABNORMAL LOW (ref 7.350–7.450)
pO2, Arterial: 88 mmHg (ref 83.0–108.0)
pO2, Arterial: 122 mmHg — ABNORMAL HIGH (ref 83.0–108.0)

## 2018-05-23 LAB — GLUCOSE, CAPILLARY
GLUCOSE-CAPILLARY: 68 mg/dL — AB (ref 70–99)
GLUCOSE-CAPILLARY: 110 mg/dL — AB (ref 70–99)
Glucose-Capillary: 75 mg/dL (ref 70–99)
Glucose-Capillary: 78 mg/dL (ref 70–99)
Glucose-Capillary: 80 mg/dL (ref 70–99)
Glucose-Capillary: 66 mg/dL — ABNORMAL LOW (ref 70–99)
Glucose-Capillary: 72 mg/dL (ref 70–99)
Glucose-Capillary: 106 mg/dL — ABNORMAL HIGH (ref 70–99)
Glucose-Capillary: 72 mg/dL (ref 70–99)

## 2018-05-23 LAB — CBC WITH DIFFERENTIAL/PLATELET
ABS IMMATURE GRANULOCYTES: 0.09 10*3/uL — AB (ref 0.00–0.07)
Basophils Absolute: 0.1 10*3/uL (ref 0.0–0.1)
Basophils Relative: 0 %
Eosinophils Absolute: 0.5 10*3/uL (ref 0.0–0.5)
Eosinophils Relative: 3 %
HCT: 30.7 % — ABNORMAL LOW (ref 36.0–46.0)
Hemoglobin: 9.2 g/dL — ABNORMAL LOW (ref 12.0–15.0)
Immature Granulocytes: 1 %
Lymphocytes Relative: 14 %
Lymphs Abs: 2.4 10*3/uL (ref 0.7–4.0)
MCH: 28.3 pg (ref 26.0–34.0)
MCHC: 30 g/dL (ref 30.0–36.0)
MCV: 94.5 fL (ref 80.0–100.0)
MONOS PCT: 6 %
Monocytes Absolute: 1.1 10*3/uL — ABNORMAL HIGH (ref 0.1–1.0)
Neutro Abs: 13.4 10*3/uL — ABNORMAL HIGH (ref 1.7–7.7)
Neutrophils Relative %: 76 %
Platelets: 152 10*3/uL (ref 150–400)
RBC: 3.25 MIL/uL — ABNORMAL LOW (ref 3.87–5.11)
RDW: 17.8 % — ABNORMAL HIGH (ref 11.5–15.5)
WBC: 17.6 10*3/uL — ABNORMAL HIGH (ref 4.0–10.5)
nRBC: 0.1 % (ref 0.0–0.2)

## 2018-05-23 LAB — BPAM RBC
Blood Product Expiration Date: 202003152359
Blood Product Expiration Date: 202003152359
Blood Product Expiration Date: 202003152359
Blood Product Expiration Date: 202003152359
Blood Product Expiration Date: 202003152359
Blood Product Expiration Date: 202003152359
Blood Product Expiration Date: 202003162359
Blood Product Expiration Date: 202003162359
Blood Product Expiration Date: 202003162359
ISSUE DATE / TIME: 202002151151
ISSUE DATE / TIME: 202002152024
ISSUE DATE / TIME: 202002152118
ISSUE DATE / TIME: 202002152118
ISSUE DATE / TIME: 202002152300
ISSUE DATE / TIME: 202002152300
Unit Type and Rh: 5100
Unit Type and Rh: 5100
Unit Type and Rh: 5100
Unit Type and Rh: 5100
Unit Type and Rh: 5100
Unit Type and Rh: 5100
Unit Type and Rh: 5100
Unit Type and Rh: 5100
Unit Type and Rh: 5100

## 2018-05-23 LAB — BASIC METABOLIC PANEL
Anion gap: 10 (ref 5–15)
BUN: 8 mg/dL (ref 6–20)
CO2: 28 mmol/L (ref 22–32)
Calcium: 7.5 mg/dL — ABNORMAL LOW (ref 8.9–10.3)
Chloride: 106 mmol/L (ref 98–111)
Creatinine, Ser: 0.95 mg/dL (ref 0.44–1.00)
GFR calc Af Amer: >60 mL/min (ref >60)
GFR calc non Af Amer: >60 mL/min (ref >60)
Glucose, Bld: 90 mg/dL (ref 70–99)
Potassium: 3.8 mmol/L (ref 3.5–5.1)
SODIUM: 144 mmol/L (ref 135–145)

## 2018-05-23 LAB — ECHOCARDIOGRAM COMPLETE
Height: 63 in
Weight: 2370.39 oz

## 2018-05-23 LAB — CULTURE, RESPIRATORY W GRAM STAIN: Culture: NORMAL

## 2018-05-23 LAB — MAGNESIUM: Magnesium: 2 mg/dL (ref 1.7–2.4)

## 2018-05-23 LAB — PHOSPHORUS: Phosphorus: 3.2 mg/dL (ref 2.5–4.6)

## 2018-05-23 MED ORDER — FLUOXETINE HCL 20 MG PO CAPS: 40.0000 mg | ORAL_CAPSULE | Freq: Every day | ORAL | Status: DC

## 2018-05-23 MED ORDER — CHLORHEXIDINE GLUCONATE CLOTH 2 % EX PADS
6.0000 | MEDICATED_PAD | Freq: Every day | CUTANEOUS | Status: DC
  Administered 2018-05-23 – 2018-06-04 (×13): 6 via TOPICAL

## 2018-05-23 MED ORDER — SODIUM CHLORIDE 0.9% FLUSH: 10.0000 mL | INTRAVENOUS | Status: DC | PRN

## 2018-05-23 MED ORDER — CISATRACURIUM BOLUS VIA INFUSION
0.0500 mg/kg | Freq: Once | INTRAVENOUS | Status: AC
  Administered 2018-05-23: 3.4 mg via INTRAVENOUS
  Filled 2018-05-23: qty 4

## 2018-05-23 MED ORDER — DEXTROSE 50 % IV SOLN
INTRAVENOUS | Status: AC
  Filled 2018-05-23: qty 50

## 2018-05-23 MED ORDER — PHENYLEPHRINE HCL-NACL 10-0.9 MG/250ML-% IV SOLN
0.0000 ug/min | INTRAVENOUS | Status: DC
  Administered 2018-05-23: 20 ug/min via INTRAVENOUS
  Administered 2018-05-23: 60 ug/min via INTRAVENOUS
  Administered 2018-05-23: 80 ug/min via INTRAVENOUS
  Administered 2018-05-23: 90 ug/min via INTRAVENOUS
  Administered 2018-05-23: 60 ug/min via INTRAVENOUS
  Administered 2018-05-23 (×2): 100 ug/min via INTRAVENOUS
  Administered 2018-05-24: 110 ug/min via INTRAVENOUS
  Filled 2018-05-23 (×7): qty 250
  Filled 2018-05-23: qty 500
  Filled 2018-05-23: qty 250

## 2018-05-23 MED ORDER — SODIUM CHLORIDE 0.9% FLUSH
10.0000 mL | Freq: Two times a day (BID) | INTRAVENOUS | Status: DC
  Administered 2018-05-23 – 2018-06-08 (×24): 10 mL

## 2018-05-23 MED ORDER — PROPOFOL 1000 MG/100ML IV EMUL
25.0000 ug/kg/min | INTRAVENOUS | Status: DC
  Administered 2018-05-23: 80 ug/kg/min via INTRAVENOUS
  Administered 2018-05-23: 25 ug/kg/min via INTRAVENOUS
  Administered 2018-05-23: 60 ug/kg/min via INTRAVENOUS
  Administered 2018-05-24: 40 ug/kg/min via INTRAVENOUS
  Administered 2018-05-24 (×2): 60 ug/kg/min via INTRAVENOUS
  Administered 2018-05-24: 50 ug/kg/min via INTRAVENOUS
  Administered 2018-05-25 (×2): 45 ug/kg/min via INTRAVENOUS
  Filled 2018-05-23 (×9): qty 100

## 2018-05-23 MED ORDER — FENTANYL CITRATE (PF) 2500 MCG/50ML IJ SOLN
0.0000 ug/h | Status: DC
  Administered 2018-05-23: 400 ug/h via INTRAVENOUS
  Administered 2018-05-24: 250 ug/h via INTRAVENOUS
  Administered 2018-05-25: 400 ug/h via INTRAVENOUS
  Administered 2018-05-25: 200 ug/h via INTRAVENOUS
  Filled 2018-05-23 (×5): qty 100

## 2018-05-23 MED ORDER — ARTIFICIAL TEARS OPHTHALMIC OINT
1.0000 "application " | TOPICAL_OINTMENT | Freq: Three times a day (TID) | OPHTHALMIC | Status: DC
  Administered 2018-05-23 – 2018-05-24 (×3): 1 via OPHTHALMIC
  Filled 2018-05-23: qty 3.5

## 2018-05-23 MED ORDER — DEXTROSE 50 % IV SOLN
25.0000 mL | Freq: Once | INTRAVENOUS | Status: AC
  Administered 2018-05-23: 25 mL via INTRAVENOUS

## 2018-05-23 MED ORDER — CHLORHEXIDINE GLUCONATE 0.12% ORAL RINSE (MEDLINE KIT)
15.0000 mL | Freq: Two times a day (BID) | OROMUCOSAL | Status: DC
  Administered 2018-05-23 – 2018-06-02 (×21): 15 mL via OROMUCOSAL

## 2018-05-23 MED ORDER — SODIUM CHLORIDE 0.9 % IV SOLN
INTRAVENOUS | Status: DC | PRN
  Administered 2018-05-24 – 2018-05-28 (×3): 250 mL via INTRAVENOUS
  Administered 2018-05-29: 450 mL via INTRAVENOUS

## 2018-05-23 MED ORDER — FENTANYL 2500MCG IN NS 250ML (10MCG/ML) PREMIX INFUSION: 100.0000 ug/h | INTRAVENOUS | Status: DC

## 2018-05-23 MED ORDER — ORAL CARE MOUTH RINSE
15.0000 mL | OROMUCOSAL | Status: DC
  Administered 2018-05-23 – 2018-06-02 (×100): 15 mL via OROMUCOSAL

## 2018-05-23 MED ORDER — IOPAMIDOL (ISOVUE-370) INJECTION 76%
100.0000 mL | Freq: Once | INTRAVENOUS | Status: AC | PRN
  Administered 2018-05-23: 56 mL via INTRAVENOUS

## 2018-05-23 MED ORDER — FENTANYL CITRATE (PF) 100 MCG/2ML IJ SOLN: 100.0000 ug | Freq: Once | INTRAMUSCULAR | Status: DC

## 2018-05-23 MED ORDER — FENTANYL CITRATE (PF) 100 MCG/2ML IJ SOLN: 100.0000 ug | Freq: Once | INTRAMUSCULAR | Status: DC | PRN

## 2018-05-23 MED ORDER — FENTANYL BOLUS VIA INFUSION
50.0000 ug | INTRAVENOUS | Status: DC | PRN
  Filled 2018-05-23: qty 50

## 2018-05-23 MED ORDER — DEXTROSE 50 % IV SOLN
25.0000 mL | Freq: Once | INTRAVENOUS | Status: AC
  Administered 2018-05-23: 25 mL via INTRAVENOUS
  Filled 2018-05-23: qty 50

## 2018-05-23 MED ORDER — SODIUM CHLORIDE 0.9 % IV SOLN
3.0000 ug/kg/min | INTRAVENOUS | Status: DC
  Administered 2018-05-23: 3 ug/kg/min via INTRAVENOUS
  Filled 2018-05-23 (×3): qty 20

## 2018-05-23 NOTE — Progress Notes (Signed)
Peripherally Inserted Central Catheter/Midline Placement  The IV Nurse has discussed with the patient and/or persons authorized to consent for the patient, the purpose of this procedure and the potential benefits and risks involved with this procedure.  The benefits include less needle sticks, lab draws from the catheter, and the patient may be discharged home with the catheter. Risks include, but not limited to, infection, bleeding, blood clot (thrombus formation), and puncture of an artery; nerve damage and irregular heartbeat and possibility to perform a PICC exchange if needed/ordered by physician.  Alternatives to this procedure were also discussed.  Bard Power PICC patient education guide, fact sheet on infection prevention and patient information card has been provided to patient /or left at bedside.   Daughter gave phone consent.   PICC/Midline Placement Documentation  PICC Double Lumen 90/93/11 PICC Left Basilic 42 cm 1 cm (Active)  Indication for Insertion or Continuance of Line Vasoactive infusions 05/23/2018  6:16 PM  Exposed Catheter (cm) 1 cm 05/23/2018  6:16 PM  Site Assessment Clean;Dry;Intact 05/23/2018  6:16 PM  Lumen #1 Status Flushed;Saline locked;Blood return noted 05/23/2018  6:16 PM  Lumen #2 Status Flushed;Saline locked;Blood return noted 05/23/2018  6:16 PM  Dressing Type Transparent 05/23/2018  6:16 PM  Dressing Status Clean;Dry;Intact;Antimicrobial disc in place 05/23/2018  6:16 PM  Dressing Change Due 05/30/18 05/23/2018  6:16 PM       Gordan Payment 05/23/2018, 6:17 PM

## 2018-05-23 NOTE — Progress Notes (Signed)
Pt transported from 3M02 to CT and back with no complications.

## 2018-05-23 NOTE — Progress Notes (Signed)
Initial Nutrition Assessment  DOCUMENTATION CODES:   Not applicable  INTERVENTION:  Recommend placing NG tube to initiate enteral nutrition support.  Vital AF 1.2 at goal rate of 55ml/hr to provide 1872 kcal, 117g protein, and 1210ml free water.   NUTRITION DIAGNOSIS:   Inadequate oral intake related to inability to eat as evidenced by NPO status.  GOAL:   Provide needs based on ASPEN/SCCM guidelines  MONITOR:   Labs, I & O's, TF tolerance, Skin, Vent status  REASON FOR ASSESSMENT:   Ventilator   ASSESSMENT:   58 year old female with a PMH of narcotic abuse, hypokalemia, anxiety, and bipolar disorder. Transferred from [facility] 05/19/2018 after developing a GI Bleed blood loss anemia. EGD performed and revealed giant gastric ulcer at the antrum entering the duodenal bulb.  2/16 Embolization of GDA, GI bleeding controlled 2/17 Extubated 2/18 Re-intubated d/t increasing respiratory insufficiency  On 2/18 patient was not tolerating Bipap and continued to pull mask off. HR increased to 150s and breathing became labored.   No family members were present. NFPE revealed some mild depletion.   Spoke with RN regarding nutrition. Unaware when a NG or OG tube will be placed, GI removed the tube previously when the EGD was performed. GI is no longer following.  MAP: 60s MV: 12.6 L/min Temp (24hrs), Avg:99.9 F (37.7 C), Min:97.6 F (36.4 C), Max:101.7 F (38.7 C)  Medications reviewed and include: phenylephrine Labs reviewed: Ca 7.5 (L)   NUTRITION - FOCUSED PHYSICAL EXAM:    Most Recent Value  Orbital Region  Mild depletion  Upper Arm Region  No depletion  Thoracic and Lumbar Region  No depletion  Buccal Region  Unable to assess  Temple Region  Mild depletion  Clavicle Bone Region  Mild depletion  Clavicle and Acromion Bone Region  Mild depletion  Scapular Bone Region  Unable to assess  Dorsal Hand  No depletion  Patellar Region  No depletion  Anterior  Thigh Region  No depletion  Posterior Calf Region  Unable to assess  Edema (RD Assessment)  Mild [facial, R&LLE]  Hair  Reviewed  Eyes  Unable to assess  Mouth  Unable to assess  Skin  Reviewed  Nails  Reviewed      Diet Order:   Diet Order            Diet NPO time specified  Diet effective now              EDUCATION NEEDS:   No education needs have been identified at this time  Skin:  Skin Assessment: Reviewed RN Assessment  Last BM:  2/19  Height:   Ht Readings from Last 1 Encounters:  05/19/18 5\' 3"  (1.6 m)    Weight:   Wt Readings from Last 1 Encounters:  05/21/18 67.2 kg    Ideal Body Weight:  52.27 kg  BMI:  Body mass index is 26.24 kg/m.  Estimated Nutritional Needs:   Kcal:  1818 kcal  Protein:  100-115 g  Fluid:  >/=1.[place] Dietetic Intern

## 2018-05-23 NOTE — Progress Notes (Signed)
SLP Cancellation Note  Patient Details Name: Christine Ortega MRN: [id number] DOB: 12-27-1960   Cancelled treatment:       Reason Eval/Treat Not Completed: Medical issues which prohibited therapy; pt reintubated yesterday afternoon.  SLP services to sign off.    [name] 05/23/2018, 9:40 AM

## 2018-05-23 NOTE — Progress Notes (Signed)
eLink Physician-Brief Progress Note Patient Name: Christine Ortega DOB: 1960/07/05 MRN: [id number]   Date of Service  05/23/2018  HPI/Events of Note  Patient is not synchronous with ventilator. Patient appears air [place] on video exam. PRVC rate increased to 30 and PEEP increase to 10. This appears to help with ventilator synchrony.   eICU Interventions  Will order: 1. Increase PRVC rate to 30. 2. Increase PEEP to 10. 3. ABG at 2 AM.      Intervention Category Major Interventions: Respiratory failure - evaluation and management  Karlen Barbar [place] 05/23/2018, 12:52 AM

## 2018-05-23 NOTE — Progress Notes (Signed)
Hypoglycemic Event  CBG: 68  Treatment: D50 25 mL (12.5 gm)  Symptoms: None  Follow-up CBG: Time:2034 CBG Result:110  Possible Reasons for Event: Inadequate meal intake  Comments/MD notified: Result    Christine Ortega

## 2018-05-23 NOTE — Progress Notes (Signed)
Referring Physician(s): Dr Elsworth Soho  Supervising Physician: Aletta Edouard  Patient Status:  [facility] - In-pt  Chief Complaint: s/p mesenteric arteriogram and empiric embolization of the GDA.     Subjective: Intubated, sedated. L groin sheath remains.   Allergies: Patient has no active allergies.  Medications: Prior to Admission medications   Medication Sig Start Date End Date Taking? Authorizing Provider  Aspirin-Salicylamide-Caffeine (ARTHRITIS STRENGTH BC POWDER PO) Take 2 packets by mouth 5 (five) times daily as needed (pain/headache).   Yes [provider]  divalproex (DEPAKOTE ER) 500 MG 24 hr tablet Take 500 mg by mouth at bedtime. For moods 03/08/18  Yes [provider]  FLUoxetine (PROZAC) 40 MG capsule Take 1 capsule (40 mg total) by mouth daily. For depression and anxiety. 08/08/11  Yes Mashburn, Marlane Hatcher, PA-C  ibuprofen (ADVIL,MOTRIN) 200 MG tablet Take 400-600 mg by mouth every 6 (six) hours as needed for headache (pain).   Yes [provider]  Melatonin 5 MG CAPS Take 20 mg by mouth at bedtime.    Yes [provider]  naproxen (NAPROSYN) 500 MG tablet Take 500-1,000 mg by mouth 3 (three) times daily as needed (pain/headache).  03/22/18  Yes [provider]  Probiotic Product (PROBIOTIC PO) Take 1 tablet by mouth daily. Women's probiotic for GI health   Yes [provider]  ziprasidone (GEODON) 80 MG capsule Take one capsule at bedtime for depression and mood stability. Patient taking differently: Take 80 mg by mouth at bedtime. for depression and mood stability. 08/08/11  Yes Ruben Im, PA-C     Vital Signs: BP (!) 81/53 (BP Location: Right Arm)   Pulse 79   Temp (!) 100.6 F (38.1 C) (Bladder)   Resp (!) 30   Ht 5' 3" (1.6 m)   Wt 148 lb 2.4 oz (67.2 kg)   SpO2 100%   BMI 26.24 kg/m   Physical Exam  Intubated, sedated Skin:  Left sheath remains in place. No bleeding or oozing.    Imaging: Ir  Angiogram Visceral Selective  Result Date: 05/20/2018 INDICATION: Acute life-threatening upper GI bleed secondary to gastric/duodenal ulcer. EXAM: 1. ULTRASOUND GUIDANCE FOR ARTERIAL ACCESS 2. SELECTIVE SUPERIOR MESENTERIC ARTERIOGRAM 3. SELECTIVE CELIAC ARTERIOGRAM 4. SELECTIVE COMMON HEPATIC ARTERIOGRAM 5. SELECTIVE GASTRODUODENAL ARTERIOGRAM AND PERCUTANEOUS COIL EMBOLIZATION MEDICATIONS: None ANESTHESIA/SEDATION: Moderate (conscious) sedation was employed during this procedure. A total of Fentanyl 50 mcg was administered intravenously. Moderate Sedation Time: 30 minutes. The patient's level of consciousness and vital signs were monitored continuously by radiology nursing throughout the procedure under my direct supervision. CONTRAST:  50 cc Isovue-300 FLUOROSCOPY TIME:  6 minutes, 6 seconds (456 mGy) COMPLICATIONS: None immediate. PROCEDURE: Informed consent was obtained from the patient's family following explanation of the procedure, risks, benefits and alternatives. The patient understands, agrees and consents for the procedure. All questions were addressed. A time out was performed prior to the initiation of the procedure. Maximal barrier sterile technique utilized including caps, mask, sterile gowns, sterile gloves, large sterile drape, hand hygiene, and Betadine prep. Attempts were made by the ICU staff to obtain a right femoral arterial line however this ultimately proved unsuccessful. Given the failed attempts, the decision was made to access the left common femoral artery. The left femoral head was marked fluoroscopically. Under ultrasound guidance, the left common femoral artery was accessed with a micropuncture kit after the overlying soft tissues were anesthetized with 1% lidocaine. An ultrasound image was saved for documentation purposes. The micropuncture sheath was  exchanged for a 5 [place] vascular sheath over a Bentson wire. A closure arteriogram was performed through the side of the sheath  confirming access within the right common femoral artery. Over a Bentson wire, a Mickelson catheter was advanced to the level of the thoracic aorta where it was back bled and flushed. The catheter was then utilized to select the celiac artery and a selective celiac arteriogram was performed The Mickelson catheter was then utilized to select the superior mesenteric artery and a selective superior mesenteric arteriogram was performed. Again, the Mickelson catheter was utilized to select the celiac artery and a selective celiac arteriogram was performed. Next, with the use of a fathom 14 wire, a regular Renegade microcatheter was advanced into the gastroduodenal artery and a sub selective gastroduodenal arteriogram was performed. A GDA was then coil embolized from its distal aspect to near its origin with multiple overlapping 2, 3 and 4 mm diameter interlock coils. The microcatheter was retracted into the common hepatic artery and a selective common hepatic arteriogram was performed. Next, the microcatheter was removed and a repeat celiac arteriogram was performed via the [facility] catheter. Finally the [facility] catheter was utilized to select the superior mesenteric artery and a selective superior mesenteric arteriogram was performed. Images were reviewed and the procedure was terminated. All wires and catheters removed from the patient. The left common femoral artery vascular sheath was sutured in place for continued arterial monitoring as per the ICU staff. Dressings were placed. FINDINGS: Celiac arteriogram demonstrates conventional branching pattern without definitive area of vessel irregularity or active extravasation. Selective gastroduodenal arteriogram demonstrates hyperemia at the expected location of the duodenal bulb without discrete area active extravasation or vessel irregularity. Following empiric percutaneous coil embolization, there is complete embolization of the gastroduodenal artery. Superior  mesenteric arteriogram was negative for definitive retrograde contribution to the gastroduodenal arterial tree. IMPRESSION: Technically successful inferior coil embolization of the gastroduodenal artery for life-threatening upper GI bleed due to gastric/duodenal ulcer. Electronically Signed   By: Sandi Mariscal M.D.   On: 05/20/2018 09:18   Ir Angiogram Visceral Selective  Result Date: 05/20/2018 INDICATION: Acute life-threatening upper GI bleed secondary to gastric/duodenal ulcer. EXAM: 1. ULTRASOUND GUIDANCE FOR ARTERIAL ACCESS 2. SELECTIVE SUPERIOR MESENTERIC ARTERIOGRAM 3. SELECTIVE CELIAC ARTERIOGRAM 4. SELECTIVE COMMON HEPATIC ARTERIOGRAM 5. SELECTIVE GASTRODUODENAL ARTERIOGRAM AND PERCUTANEOUS COIL EMBOLIZATION MEDICATIONS: None ANESTHESIA/SEDATION: Moderate (conscious) sedation was employed during this procedure. A total of Fentanyl 50 mcg was administered intravenously. Moderate Sedation Time: 30 minutes. The patient's level of consciousness and vital signs were monitored continuously by radiology nursing throughout the procedure under my direct supervision. CONTRAST:  50 cc Isovue-300 FLUOROSCOPY TIME:  6 minutes, 6 seconds (048 mGy) COMPLICATIONS: None immediate. PROCEDURE: Informed consent was obtained from the patient's family following explanation of the procedure, risks, benefits and alternatives. The patient understands, agrees and consents for the procedure. All questions were addressed. A time out was performed prior to the initiation of the procedure. Maximal barrier sterile technique utilized including caps, mask, sterile gowns, sterile gloves, large sterile drape, hand hygiene, and Betadine prep. Attempts were made by the ICU staff to obtain a right femoral arterial line however this ultimately proved unsuccessful. Given the failed attempts, the decision was made to access the left common femoral artery. The left femoral head was marked fluoroscopically. Under ultrasound guidance, the left  common femoral artery was accessed with a micropuncture kit after the overlying soft tissues were anesthetized with 1% lidocaine. An ultrasound image was saved for documentation purposes.  The micropuncture sheath was exchanged for a 5 [place] vascular sheath over a Bentson wire. A closure arteriogram was performed through the side of the sheath confirming access within the right common femoral artery. Over a Bentson wire, a Mickelson catheter was advanced to the level of the thoracic aorta where it was back bled and flushed. The catheter was then utilized to select the celiac artery and a selective celiac arteriogram was performed The Mickelson catheter was then utilized to select the superior mesenteric artery and a selective superior mesenteric arteriogram was performed. Again, the Mickelson catheter was utilized to select the celiac artery and a selective celiac arteriogram was performed. Next, with the use of a fathom 14 wire, a regular Renegade microcatheter was advanced into the gastroduodenal artery and a sub selective gastroduodenal arteriogram was performed. A GDA was then coil embolized from its distal aspect to near its origin with multiple overlapping 2, 3 and 4 mm diameter interlock coils. The microcatheter was retracted into the common hepatic artery and a selective common hepatic arteriogram was performed. Next, the microcatheter was removed and a repeat celiac arteriogram was performed via the [facility] catheter. Finally the [facility] catheter was utilized to select the superior mesenteric artery and a selective superior mesenteric arteriogram was performed. Images were reviewed and the procedure was terminated. All wires and catheters removed from the patient. The left common femoral artery vascular sheath was sutured in place for continued arterial monitoring as per the ICU staff. Dressings were placed. FINDINGS: Celiac arteriogram demonstrates conventional branching pattern without definitive area  of vessel irregularity or active extravasation. Selective gastroduodenal arteriogram demonstrates hyperemia at the expected location of the duodenal bulb without discrete area active extravasation or vessel irregularity. Following empiric percutaneous coil embolization, there is complete embolization of the gastroduodenal artery. Superior mesenteric arteriogram was negative for definitive retrograde contribution to the gastroduodenal arterial tree. IMPRESSION: Technically successful inferior coil embolization of the gastroduodenal artery for life-threatening upper GI bleed due to gastric/duodenal ulcer. Electronically Signed   By: Sandi Mariscal M.D.   On: 05/20/2018 09:18   Ir Angiogram Selective Each Additional Vessel  Result Date: 05/21/2018 INDICATION: Acute life-threatening upper GI bleed secondary to gastric/duodenal ulcer.  EXAM: 1. ULTRASOUND GUIDANCE FOR ARTERIAL ACCESS 2. SELECTIVE SUPERIOR MESENTERIC ARTERIOGRAM 3. SELECTIVE CELIAC ARTERIOGRAM 4. SELECTIVE COMMON HEPATIC ARTERIOGRAM 5. SELECTIVE GASTRODUODENAL ARTERIOGRAM AND PERCUTANEOUS COIL EMBOLIZATION  MEDICATIONS: None  ANESTHESIA/SEDATION: Moderate (conscious) sedation was employed during this procedure. A total of Fentanyl 50 mcg was administered intravenously.  Moderate Sedation Time: 30 minutes. The patient's level of consciousness and vital signs were monitored continuously by radiology nursing throughout the procedure under my direct supervision.  CONTRAST:  50 cc Isovue-300  FLUOROSCOPY TIME:  6 minutes, 6 seconds (287 mGy)  COMPLICATIONS: None immediate.  PROCEDURE: Informed consent was obtained from the patient's family following explanation of the procedure, risks, benefits and alternatives. The patient understands, agrees and consents for the procedure. All questions were addressed. A time out was performed prior to the initiation of the procedure. Maximal barrier sterile technique utilized including caps, mask, sterile gowns,  sterile gloves, large sterile drape, hand hygiene, and Betadine prep.  Attempts were made by the ICU staff to obtain a right femoral arterial line however this ultimately proved unsuccessful. Given the failed attempts, the decision was made to access the left common femoral artery.  The left femoral head was marked fluoroscopically. Under ultrasound guidance, the left common femoral artery was accessed with a micropuncture kit after the overlying  soft tissues were anesthetized with 1% lidocaine. An ultrasound image was saved for documentation purposes. The micropuncture sheath was exchanged for a 5 [place] vascular sheath over a Bentson wire. A closure arteriogram was performed through the side of the sheath confirming access within the right common femoral artery.  Over a Bentson wire, a Mickelson catheter was advanced to the level of the thoracic aorta where it was back bled and flushed. The catheter was then utilized to select the celiac artery and a selective celiac arteriogram was performed  The Mickelson catheter was then utilized to select the superior mesenteric artery and a selective superior mesenteric arteriogram was performed.  Again, the Mickelson catheter was utilized to select the celiac artery and a selective celiac arteriogram was performed.  Next, with the use of a fathom 14 wire, a regular Renegade microcatheter was advanced into the gastroduodenal artery and a sub selective gastroduodenal arteriogram was performed.  A GDA was then coil embolized from its distal aspect to near its origin with multiple overlapping 2, 3 and 4 mm diameter interlock coils.  The microcatheter was retracted into the common hepatic artery and a selective common hepatic arteriogram was performed. Next, the microcatheter was removed and a repeat celiac arteriogram was performed via the [facility] catheter.  Finally the [facility] catheter was utilized to select the superior mesenteric artery and a selective superior  mesenteric arteriogram was performed.  Images were reviewed and the procedure was terminated. All wires and catheters removed from the patient.  The left common femoral artery vascular sheath was sutured in place for continued arterial monitoring as per the ICU staff. Dressings were placed.  FINDINGS: Celiac arteriogram demonstrates conventional branching pattern without definitive area of vessel irregularity or active extravasation.  Selective gastroduodenal arteriogram demonstrates hyperemia at the expected location of the duodenal bulb without discrete area active extravasation or vessel irregularity. Following empiric percutaneous coil embolization, there is complete embolization of the gastroduodenal artery.  Superior mesenteric arteriogram was negative for definitive retrograde contribution to the gastroduodenal arterial tree.  IMPRESSION: Technically successful inferior coil embolization of the gastroduodenal artery for life-threatening upper GI bleed due to gastric/duodenal ulcer.   Electronically Signed   By: Sandi Mariscal M.D.   On: 05/20/2018 09:18   Ir Angiogram Selective Each Additional Vessel  Result Date: 05/20/2018 INDICATION: Acute life-threatening upper GI bleed secondary to gastric/duodenal ulcer. EXAM: 1. ULTRASOUND GUIDANCE FOR ARTERIAL ACCESS 2. SELECTIVE SUPERIOR MESENTERIC ARTERIOGRAM 3. SELECTIVE CELIAC ARTERIOGRAM 4. SELECTIVE COMMON HEPATIC ARTERIOGRAM 5. SELECTIVE GASTRODUODENAL ARTERIOGRAM AND PERCUTANEOUS COIL EMBOLIZATION MEDICATIONS: None ANESTHESIA/SEDATION: Moderate (conscious) sedation was employed during this procedure. A total of Fentanyl 50 mcg was administered intravenously. Moderate Sedation Time: 30 minutes. The patient's level of consciousness and vital signs were monitored continuously by radiology nursing throughout the procedure under my direct supervision. CONTRAST:  50 cc Isovue-300 FLUOROSCOPY TIME:  6 minutes, 6 seconds (280 mGy) COMPLICATIONS: None  immediate. PROCEDURE: Informed consent was obtained from the patient's family following explanation of the procedure, risks, benefits and alternatives. The patient understands, agrees and consents for the procedure. All questions were addressed. A time out was performed prior to the initiation of the procedure. Maximal barrier sterile technique utilized including caps, mask, sterile gowns, sterile gloves, large sterile drape, hand hygiene, and Betadine prep. Attempts were made by the ICU staff to obtain a right femoral arterial line however this ultimately proved unsuccessful. Given the failed attempts, the decision was made to access the left common femoral artery. The left femoral head  was marked fluoroscopically. Under ultrasound guidance, the left common femoral artery was accessed with a micropuncture kit after the overlying soft tissues were anesthetized with 1% lidocaine. An ultrasound image was saved for documentation purposes. The micropuncture sheath was exchanged for a 5 [place] vascular sheath over a Bentson wire. A closure arteriogram was performed through the side of the sheath confirming access within the right common femoral artery. Over a Bentson wire, a Mickelson catheter was advanced to the level of the thoracic aorta where it was back bled and flushed. The catheter was then utilized to select the celiac artery and a selective celiac arteriogram was performed The Mickelson catheter was then utilized to select the superior mesenteric artery and a selective superior mesenteric arteriogram was performed. Again, the Mickelson catheter was utilized to select the celiac artery and a selective celiac arteriogram was performed. Next, with the use of a fathom 14 wire, a regular Renegade microcatheter was advanced into the gastroduodenal artery and a sub selective gastroduodenal arteriogram was performed. A GDA was then coil embolized from its distal aspect to near its origin with multiple overlapping 2, 3  and 4 mm diameter interlock coils. The microcatheter was retracted into the common hepatic artery and a selective common hepatic arteriogram was performed. Next, the microcatheter was removed and a repeat celiac arteriogram was performed via the [facility] catheter. Finally the [facility] catheter was utilized to select the superior mesenteric artery and a selective superior mesenteric arteriogram was performed. Images were reviewed and the procedure was terminated. All wires and catheters removed from the patient. The left common femoral artery vascular sheath was sutured in place for continued arterial monitoring as per the ICU staff. Dressings were placed. FINDINGS: Celiac arteriogram demonstrates conventional branching pattern without definitive area of vessel irregularity or active extravasation. Selective gastroduodenal arteriogram demonstrates hyperemia at the expected location of the duodenal bulb without discrete area active extravasation or vessel irregularity. Following empiric percutaneous coil embolization, there is complete embolization of the gastroduodenal artery. Superior mesenteric arteriogram was negative for definitive retrograde contribution to the gastroduodenal arterial tree. IMPRESSION: Technically successful inferior coil embolization of the gastroduodenal artery for life-threatening upper GI bleed due to gastric/duodenal ulcer. Electronically Signed   By: Sandi Mariscal M.D.   On: 05/20/2018 09:18   Ir US Guide Vasc Access Left  Result Date: 05/20/2018 INDICATION: Acute life-threatening upper GI bleed secondary to gastric/duodenal ulcer. EXAM: 1. ULTRASOUND GUIDANCE FOR ARTERIAL ACCESS 2. SELECTIVE SUPERIOR MESENTERIC ARTERIOGRAM 3. SELECTIVE CELIAC ARTERIOGRAM 4. SELECTIVE COMMON HEPATIC ARTERIOGRAM 5. SELECTIVE GASTRODUODENAL ARTERIOGRAM AND PERCUTANEOUS COIL EMBOLIZATION MEDICATIONS: None ANESTHESIA/SEDATION: Moderate (conscious) sedation was employed during this procedure. A total of  Fentanyl 50 mcg was administered intravenously. Moderate Sedation Time: 30 minutes. The patient's level of consciousness and vital signs were monitored continuously by radiology nursing throughout the procedure under my direct supervision. CONTRAST:  50 cc Isovue-300 FLUOROSCOPY TIME:  6 minutes, 6 seconds (350 mGy) COMPLICATIONS: None immediate. PROCEDURE: Informed consent was obtained from the patient's family following explanation of the procedure, risks, benefits and alternatives. The patient understands, agrees and consents for the procedure. All questions were addressed. A time out was performed prior to the initiation of the procedure. Maximal barrier sterile technique utilized including caps, mask, sterile gowns, sterile gloves, large sterile drape, hand hygiene, and Betadine prep. Attempts were made by the ICU staff to obtain a right femoral arterial line however this ultimately proved unsuccessful. Given the failed attempts, the decision was made to access the left common femoral  artery. The left femoral head was marked fluoroscopically. Under ultrasound guidance, the left common femoral artery was accessed with a micropuncture kit after the overlying soft tissues were anesthetized with 1% lidocaine. An ultrasound image was saved for documentation purposes. The micropuncture sheath was exchanged for a 5 [place] vascular sheath over a Bentson wire. A closure arteriogram was performed through the side of the sheath confirming access within the right common femoral artery. Over a Bentson wire, a Mickelson catheter was advanced to the level of the thoracic aorta where it was back bled and flushed. The catheter was then utilized to select the celiac artery and a selective celiac arteriogram was performed The Mickelson catheter was then utilized to select the superior mesenteric artery and a selective superior mesenteric arteriogram was performed. Again, the Mickelson catheter was utilized to select the celiac  artery and a selective celiac arteriogram was performed. Next, with the use of a fathom 14 wire, a regular Renegade microcatheter was advanced into the gastroduodenal artery and a sub selective gastroduodenal arteriogram was performed. A GDA was then coil embolized from its distal aspect to near its origin with multiple overlapping 2, 3 and 4 mm diameter interlock coils. The microcatheter was retracted into the common hepatic artery and a selective common hepatic arteriogram was performed. Next, the microcatheter was removed and a repeat celiac arteriogram was performed via the [facility] catheter. Finally the [facility] catheter was utilized to select the superior mesenteric artery and a selective superior mesenteric arteriogram was performed. Images were reviewed and the procedure was terminated. All wires and catheters removed from the patient. The left common femoral artery vascular sheath was sutured in place for continued arterial monitoring as per the ICU staff. Dressings were placed. FINDINGS: Celiac arteriogram demonstrates conventional branching pattern without definitive area of vessel irregularity or active extravasation. Selective gastroduodenal arteriogram demonstrates hyperemia at the expected location of the duodenal bulb without discrete area active extravasation or vessel irregularity. Following empiric percutaneous coil embolization, there is complete embolization of the gastroduodenal artery. Superior mesenteric arteriogram was negative for definitive retrograde contribution to the gastroduodenal arterial tree. IMPRESSION: Technically successful inferior coil embolization of the gastroduodenal artery for life-threatening upper GI bleed due to gastric/duodenal ulcer. Electronically Signed   By: Sandi Mariscal M.D.   On: 05/20/2018 09:18   Dg Chest Port 1 View  Result Date: 05/23/2018 CLINICAL DATA:  Respiratory failure EXAM: PORTABLE CHEST 1 VIEW COMPARISON:  Yesterday FINDINGS: Improved  endotracheal tube positioning with tip now above the carina, at the level of the clavicular heads. Unchanged bilateral coarse interstitial opacities. No pleural fluid or pneumothorax. Normal heart size. Lumbar laminectomy changes are seen. IMPRESSION: 1. Improved endotracheal tube positioning. 2. Unchanged generalized interstitial opacity. Electronically Signed   By: Monte Fantasia M.D.   On: 05/23/2018 07:41   Portable Chest X-ray  Result Date: 05/22/2018 CLINICAL DATA:  Status post intubation EXAM: PORTABLE CHEST 1 VIEW COMPARISON:  05/22/2018 FINDINGS: Interval intubation. Tip of the endotracheal tube is in the proximal right mainstem bronchus. Extensive reticular and ground-glass opacity. Stable cardiomediastinal silhouette. Apical pleural thickening. No pneumothorax. IMPRESSION: 1. Endotracheal tube tip overlies the proximal right mainstem bronchus. Consider retraction by approximately 3-3.5 cm. 2. Continued extensive reticular and ground-glass opacity which may be secondary to edema or diffuse pneumonia. Critical Value/emergent results were called by telephone at the time of interpretation on 05/22/2018 at 6:03 pm to Dr. [name] , who verbally acknowledged these results. Electronically Signed   By: Madie Reno.D.  On: 05/22/2018 18:09   Dg Chest Port 1 View  Result Date: 05/22/2018 CLINICAL DATA:  58 year old female with acute respiratory failure. Subsequent encounter. EXAM: PORTABLE CHEST 1 VIEW COMPARISON:  05/19/2018 chest x-ray. FINDINGS: Progressive diffuse airspace disease with ARDS pattern. This represent pulmonary edema although infectious infiltrate not excluded in the proper clinical setting. Limited evaluation of mediastinal and cardiac silhouette. No obvious pneumothorax. IMPRESSION: Progressive diffuse airspace disease with ARDS pattern. This may reflect result of pulmonary edema. Infectious process not excluded in proper clinical setting. These results will be called to the  ordering clinician or representative by the Radiologist Assistant, and communication documented in the PACS or zVision Dashboard. Electronically Signed   By: Genia Del M.D.   On: 05/22/2018 07:23   Dg Chest Port 1 View  Result Date: 05/19/2018 CLINICAL DATA:  Respiratory failure EXAM: PORTABLE CHEST 1 VIEW COMPARISON:  Chest x-ray from earlier same day. FINDINGS: Endotracheal tube has been placed with tip well-positioned at approximately 3 cm above the carina. Heart size and mediastinal contours are stable in the short-term interval. The bilateral reticulonodular opacities are unchanged in the short-term interval. No pleural effusion or pneumothorax seen. IMPRESSION: 1. Endotracheal tube well positioned with tip approximately 3 cm above the carina. 2. Otherwise stable exam. Electronically Signed   By: Franki Cabot M.D.   On: 05/19/2018 23:29   Dg Abd Portable 1v  Result Date: 05/20/2018 CLINICAL DATA:  Orogastric tube placement. EXAM: PORTABLE ABDOMEN - 1 VIEW COMPARISON:  One view abdomen 05/19/2018. FINDINGS: 1358 hours. Enteric tube is looped in the proximal stomach. Interval decompression of the stomach. There is moderate distention of the colon. Embolization clips overlie the upper lumbar spine. There are fibrotic changes at both lung bases. IMPRESSION: Enteric tube is coiled in the proximal stomach with good decompression of the stomach. Increased colonic distension, likely ileus. Electronically Signed   By: Richardean Sale M.D.   On: 05/20/2018 14:23   Dg Abd Portable 1v  Result Date: 05/19/2018 CLINICAL DATA:  Bowel perforation. EXAM: PORTABLE ABDOMEN - 1 VIEW COMPARISON:  None. FINDINGS: Stomach and small bowel are distended with air. No evidence of small bowel obstruction. No convincing evidence of free intraperitoneal air. IMPRESSION: No convincing evidence of bowel perforation on this semi-erect plain film examination of the abdomen. If clinical concern for bowel perforation persists,  would consider additional decubitus view. Electronically Signed   By: Franki Cabot M.D.   On: 05/19/2018 23:31   [place] [employer]  Result Date: 05/20/2018 INDICATION: Acute life-threatening upper GI bleed secondary to gastric/duodenal ulcer. EXAM: 1. ULTRASOUND GUIDANCE FOR ARTERIAL ACCESS 2. SELECTIVE SUPERIOR MESENTERIC ARTERIOGRAM 3. SELECTIVE CELIAC ARTERIOGRAM 4. SELECTIVE COMMON HEPATIC ARTERIOGRAM 5. SELECTIVE GASTRODUODENAL ARTERIOGRAM AND PERCUTANEOUS COIL EMBOLIZATION MEDICATIONS: None ANESTHESIA/SEDATION: Moderate (conscious) sedation was employed during this procedure. A total of Fentanyl 50 mcg was administered intravenously. Moderate Sedation Time: 30 minutes. The patient's level of consciousness and vital signs were monitored continuously by radiology nursing throughout the procedure under my direct supervision. CONTRAST:  50 cc Isovue-300 FLUOROSCOPY TIME:  6 minutes, 6 seconds (333 mGy) COMPLICATIONS: None immediate. PROCEDURE: Informed consent was obtained from the patient's family following explanation of the procedure, risks, benefits and alternatives. The patient understands, agrees and consents for the procedure. All questions were addressed. A time out was performed prior to the initiation of the procedure. Maximal barrier sterile technique utilized including caps, mask, sterile gowns, sterile gloves, large sterile drape,  hand hygiene, and Betadine prep. Attempts were made by the ICU staff to obtain a right femoral arterial line however this ultimately proved unsuccessful. Given the failed attempts, the decision was made to access the left common femoral artery. The left femoral head was marked fluoroscopically. Under ultrasound guidance, the left common femoral artery was accessed with a micropuncture kit after the overlying soft tissues were anesthetized with 1% lidocaine. An ultrasound image was saved for documentation purposes. The  micropuncture sheath was exchanged for a 5 [place] vascular sheath over a Bentson wire. A closure arteriogram was performed through the side of the sheath confirming access within the right common femoral artery. Over a Bentson wire, a Mickelson catheter was advanced to the level of the thoracic aorta where it was back bled and flushed. The catheter was then utilized to select the celiac artery and a selective celiac arteriogram was performed The Mickelson catheter was then utilized to select the superior mesenteric artery and a selective superior mesenteric arteriogram was performed. Again, the Mickelson catheter was utilized to select the celiac artery and a selective celiac arteriogram was performed. Next, with the use of a fathom 14 wire, a regular Renegade microcatheter was advanced into the gastroduodenal artery and a sub selective gastroduodenal arteriogram was performed. A GDA was then coil embolized from its distal aspect to near its origin with multiple overlapping 2, 3 and 4 mm diameter interlock coils. The microcatheter was retracted into the common hepatic artery and a selective common hepatic arteriogram was performed. Next, the microcatheter was removed and a repeat celiac arteriogram was performed via the [facility] catheter. Finally the [facility] catheter was utilized to select the superior mesenteric artery and a selective superior mesenteric arteriogram was performed. Images were reviewed and the procedure was terminated. All wires and catheters removed from the patient. The left common femoral artery vascular sheath was sutured in place for continued arterial monitoring as per the ICU staff. Dressings were placed. FINDINGS: Celiac arteriogram demonstrates conventional branching pattern without definitive area of vessel irregularity or active extravasation. Selective gastroduodenal arteriogram demonstrates hyperemia at the expected location of the duodenal bulb without discrete area active  extravasation or vessel irregularity. Following empiric percutaneous coil embolization, there is complete embolization of the gastroduodenal artery. Superior mesenteric arteriogram was negative for definitive retrograde contribution to the gastroduodenal arterial tree. IMPRESSION: Technically successful inferior coil embolization of the gastroduodenal artery for life-threatening upper GI bleed due to gastric/duodenal ulcer. Electronically Signed   By: Sandi Mariscal M.D.   On: 05/20/2018 09:18    Labs:  CBC: Recent Labs    05/22/18 0223 05/22/18 0814  05/22/18 1728 05/22/18 1743 05/23/18 0223 05/23/18 0527  WBC 15.1* 15.3*  --  14.5*  --   --  17.6*  HGB 9.5* 9.6*   < > 9.3* 8.8* 8.8* 9.2*  HCT 29.9* 30.7*   < > 29.9* 26.0* 26.0* 30.7*  PLT 172 166  --  147*  --   --  152   < > = values in this interval not displayed.    COAGS: Recent Labs    05/19/18 1839 05/21/18 0450  INR 1.23 1.17  APTT 31  --     BMP: Recent Labs    05/20/18 0233  05/21/18 0000 05/22/18 0223 05/22/18 0823 05/22/18 1743 05/23/18 0223 05/23/18 0527  NA 141   < > 142 144 145 142 145 144  K 4.4   < > 3.3* 3.1* 2.9* 3.1* 3.4* 3.8  CL 116*  --  117* 113*  --   --   --  106  CO2 17*  --  18* 23  --   --   --  28  GLUCOSE 196*  --  144* 98  --   --   --  90  BUN 16  --  23* 13  --   --   --  8  CALCIUM 6.6*  --  7.1* 7.3*  --   --   --  7.5*  CREATININE 1.08*  --  1.17* 0.82  --   --   --  0.95  GFRNONAA 57*  --  52* >60  --   --   --  >60  GFRAA >60  --  60* >60  --   --   --  >60   < > = values in this interval not displayed.    LIVER FUNCTION TESTS: Recent Labs    05/19/18 1839 05/21/18 0450  BILITOT 0.2* 0.6  AST 33 41  ALT 12 13  ALKPHOS 79 65  PROT 5.0* 4.7*  ALBUMIN 1.9* 1.7*    Assessment and Plan: GDA embolization 2/16 HgB 9.2, overall stable  Last transfusion still 2/16. Left groin sheath remains in place without issue. Call IR if needed for removal.    Electronically  Signed: Docia Barrier, PA 05/23/2018, 12:07 PM   I spent a total of 15 Minutes at the the patient's bedside AND on the patient's hospital floor or unit, greater than 50% of which was counseling/coordinating care for GDA embolization

## 2018-05-23 NOTE — Progress Notes (Signed)
Wasted 255ml of fentanyl with Mickle Mallory, RN

## 2018-05-23 NOTE — Progress Notes (Signed)
PT Cancellation Note  Patient Details Name: Christine Ortega MRN: [id number] DOB: 1960/08/05   Cancelled Treatment:    Reason Eval/Treat Not Completed: Medical issues which prohibited therapy intubated, sedated, hold PT per RN, patient awaiting ABG and plan per team.   Reinaldo Berber, PT, DPT Acute Rehabilitation Services Pager: [phone] Office: [phone]     Reinaldo Berber 05/23/2018, 3:04 PM

## 2018-05-23 NOTE — Progress Notes (Signed)
NAME:  Christine Ortega, MRN:  [id number], DOB:  1960/05/12, LOS: 74 ADMISSION DATE:  05/19/2018, CONSULTATION DATE: 2 15 2020 REFERRING MD: GI services, CHIEF COMPLAINT: GI bleed, pneumonia, blood loss anemia shock  Brief History   58 year old with UGI bleed  intubated for endoscopy s/p IR embolization of gastroduodenal artery for large bleeding gastric ulcer She had bilateral infiltrates and 3 L oxygen requirement on admission, presumptively diagnosed as community-acquired pneumonia  Past Medical History  Attention deficit disorder, Schizophrenia, Bipolar, History of chronic opioid abuse History of tobacco abuse, Overuse of NSAIDs  Significant Hospital Events   05/19/2018 due to excessive GI bleeding transferred to intensive care unit with plan for endoscopy. 2/15 - Flu PCR - neg 2/16 >> change from Precedex to propofol; extubated 2/18 increasing FIO2 needs. Failed BIPAP; reintubated.  2/19 still hypoxic. CXR w/ diffuse airspace disease. PPlats 38 so titrated down to 6 cc/kg PBW.   Consults:  05/19/2018 pulmonary critical care 05/19/2018 GI and IR  Procedures:  2/15 ETT >> 2/16 2/15 R femoral cordis >>2/18 -pt pulled out 2/15 left femoral aline >>  Significant Diagnostic Tests:  05/19/2018 endoscopy>>  - Normal esophagus. - Red blood in the entire stomach with significant clot burden - as above time not taken to clear the entire stomach due to obvious pathology at the antrum. It is possible there is pathology in the fundus / proximal stomach to cause bleeding but would not have been visualized on this exam. - Giant gastric ulcer at the antrum entering duodenal bulb - not amenable to endoscopic therapy given appearance and poor ability to visualize the area (of note, hemospray currently not available due to FDA recall). The procedure was aborted due to tenous status with need for more definitive therapy - Blood in the entire examined duodenum. This patient is having life  threatening bleeding presumed due to giant gastric ulcer which did not appear amenable to endoscopic therapy. IR was called for attempt at embolization. 2./16 - Post mesenteric arteriogram and empiric embolization of the GDA.   2/18 reintubated. For on-going resp failure.  2/19: Chest x-ray fairly stable.  Increasing PEEP and FiO2 requirements out of proportion to chest x-ray changes.  CT chest ordered to rule out pulmonary emboli  Micro Data:  05/19/2018 blood cultures x2>> ng 05/19/2018 urine culture >>  >100k e.Coli >> 05/19/2018 sputum culture >> 2/15 RVP - neg  Antimicrobials:  05/19/2018 ceftriaxone >> 05/19/2018 Zithromax >>  Interim history/subjective:  Still high FIO2 and peep needs. Low grade temp pplat ~38  Objective   Blood pressure (Abnormal) 77/49, pulse 76, temperature (Abnormal) 100.4 F (38 C), resp. rate (Abnormal) 30, height 5\' 3"  (1.6 m), weight 67.2 kg, SpO2 100 %.    Vent Mode: PRVC FiO2 (%):  [40 %-100 %] 50 % Set Rate:  [24 bmp-30 bmp] 30 bmp Vt Set:  [420 mL] 420 mL PEEP:  [5 cmH20-10 cmH20] 10 cmH20 Plateau Pressure:  [20 cmH20-34 cmH20] 34 cmH20   Intake/Output Summary (Last 24 hours) at 05/23/2018 1430 Last data filed at 05/23/2018 1300 Gross per 24 hour  Intake 2025.05 ml  Output 2000 ml  Net 25.05 ml   Filed Weights   05/19/18 0650 05/21/18 0308  Weight: 63.5 kg 67.2 kg   General: 58 year old white female currently on full ventilatory support she remains on high PEEP and FiO2 HEENT normocephalic atraumatic orally intubated Pulmonary: Diminished throughout, plateau pressure 38 on tidal volume of 8 cc.  Predicted body weight Cardiac: Tachycardic regular  rhythm Abdomen, soft.  Hypoactive Extremities: Minimal edema Neuro: Agitated at times  Resolved Hospital Problem list   Hemorrhagic shock 2/2 UGIB: s/p 7 units PRBC, Hgb 4.1 >> 11 >> now stable 9.5  Assessment & Plan:   Acute hypoxic respiratory failure: Suspected CAP vs aspiration,   concerning for ARDS pulmonary edema +/- TRALI/ TACO - multifactorial related to ABLA, shock, procedure - extubated 2/17; re-intubated 2/19 PCXR personally reviewed: ett in satisfactory position no sig change in bilateral airspace disease c/w prior exam oxygen requirements seem out of proportion to chest x-ray findings when comparing serial exams Still 7.4 liters +  -pplat 38 on 71ml/kg Plan Transition to 51ml/kg PBW and adjust PEEP/FIO2 Repeat ABG; may need to accept some degree of hypercarbia If P/F ratio < 200 will initiate NMB protocol; may even need to consider prone PAD protocol RASS goal -3 and adjust to -5 if NMB started VAP bundle CT chest r/o PE  If CT chest negative for pulmonary emboli continue diuresis Repeat CXR in am  Day #6 ceftriaxone, likely stop at day 8  Acute UGIB w/ acute blood loss anemia  - found to have bleeding gastric ulcer s/p IR embolization of GDA - hx NSAIDS use for chronic pain -GI following -h pylori was equivocal  -hgb stable Plan Cont daily CBC PPI BID SCDs  History attention deficit disorder, bipolar, depression, schizophrenia chronic pain Acute Encephalopathy/ Delirium  Plan Cont depakote   Intermittent Fluid and electrolyte imbalance Plan Trend Chemistries  Replace as indicated   QTc prolongation Plan  Leukocytosis/ fever - suspected CAP +/- UTI Plan  Elevated trop w/ ECHO suggesting increased RV pressures but nml EF 2/19 Plan Cont supportive care Get CT angio  Best practice:  Diet: NPO Pain/Anxiety/Delirium protocol (if indicated): precedex  VAP protocol (if indicated): n/a DVT prophylaxis: SCD- no anticoagulation with GI bleed GI prophylaxis: PPI drip Glucose control: cbg q 4 Mobility: Bedrest Code Status: Full Family Communication: niece at bedside 2/17.  No family at bedside 2/18 Disposition: ICU remains critically ill.  Needs close observation of plateau pressures, adjustment of ventilation, diuresis, and rule out  pulmonary emboli.  My cct 22 minutes  Erick Colace ACNP-BC [employer] Pager # [phone] OR # [phone] if no answer

## 2018-05-23 NOTE — Progress Notes (Signed)
Hypoglycemic Event  CBG: 66 @ 1329  Treatment: D50 25 mL (12.5 gm) @ 1343  Symptoms: None  Follow-up CBG: Time: 1403 CBG Result:106  Possible Reasons for Event: NPO   Comments/MD notified:    Phill Myron D'Adamo

## 2018-05-23 NOTE — Progress Notes (Signed)
[facility] Progress Note Patient Name: Christine Ortega DOB: [birth year] MRN: [id number]   Date of Service  05/23/2018  HPI/Events of Note  Hypotension - BP = 80/50 with MAP = 60. She has had several isotonic fluid challenges. No CVL or CVP.  eICU Interventions  Will order: 1. Phenylephrine IV infusion. Titrate to MAP >= 65.      Intervention Category Major Interventions: Hypotension - evaluation and management  Sommer,Steven Eugene 05/23/2018, 5:04 AM

## 2018-05-23 NOTE — Progress Notes (Addendum)
[facility] Progress Note Patient Name: Christine Ortega DOB: 1960-07-13 MRN: [id number]   Date of Service  05/23/2018  HPI/Events of Note  1. CTA chest neg for PE 2. Off of nimbex since 6 pm, due to getting large dose about 100 when placing PICC line earlier. Asking whether to re start again.  eICU Interventions  - ok to re start nimbex for ARDS. Now re intubated. No PE.      Intervention Category Intermediate Interventions: Diagnostic test evaluation;Communication with other healthcare providers and/or family  Elmer Sow 05/23/2018, 10:57 PM   1:11 AM  Clarification on Nimbex.  Earlier signed out by bed side MD to follow CTPA at the beginning of the shift. He said she was on nimbex already since noon. As per RN, got Nimbex after PICC line, not before that.  So he was never on Nimbex before PICC.

## 2018-05-23 NOTE — Progress Notes (Signed)
RR increased to 30 and PEEP to 10 per MD order.  Recheck gas at 0200.

## 2018-05-23 NOTE — Progress Notes (Signed)
  Echocardiogram 2D Echocardiogram has been performed.  Christine Ortega 05/23/2018, 11:37 AM

## 2018-05-24 ENCOUNTER — Inpatient Hospital Stay (HOSPITAL_COMMUNITY): Payer: Medicare Other

## 2018-05-24 DIAGNOSIS — J9600 Acute respiratory failure, unspecified whether with hypoxia or hypercapnia: Secondary | ICD-10-CM

## 2018-05-24 LAB — CBC WITH DIFFERENTIAL/PLATELET
Abs Immature Granulocytes: 0.09 10*3/uL — ABNORMAL HIGH (ref 0.00–0.07)
Basophils Absolute: 0.1 10*3/uL (ref 0.0–0.1)
Basophils Relative: 1 %
Eosinophils Absolute: 1 10*3/uL — ABNORMAL HIGH (ref 0.0–0.5)
Eosinophils Relative: 8 %
HCT: 28.7 % — ABNORMAL LOW (ref 36.0–46.0)
HEMOGLOBIN: 8.5 g/dL — AB (ref 12.0–15.0)
Immature Granulocytes: 1 %
Lymphocytes Relative: 23 %
Lymphs Abs: 3 10*3/uL (ref 0.7–4.0)
MCH: 28.1 pg (ref 26.0–34.0)
MCHC: 29.6 g/dL — ABNORMAL LOW (ref 30.0–36.0)
MCV: 95 fL (ref 80.0–100.0)
Monocytes Absolute: 0.7 10*3/uL (ref 0.1–1.0)
Monocytes Relative: 5 %
Neutro Abs: 8.1 10*3/uL — ABNORMAL HIGH (ref 1.7–7.7)
Neutrophils Relative %: 62 %
Platelets: 143 10*3/uL — ABNORMAL LOW (ref 150–400)
RBC: 3.02 MIL/uL — ABNORMAL LOW (ref 3.87–5.11)
RDW: 18 % — ABNORMAL HIGH (ref 11.5–15.5)
WBC: 13 10*3/uL — AB (ref 4.0–10.5)
nRBC: 0.2 % (ref 0.0–0.2)

## 2018-05-24 LAB — CULTURE, BLOOD (ROUTINE X 2)
CULTURE: NO GROWTH
CULTURE: NO GROWTH
Culture: NO GROWTH
Culture: NO GROWTH
Special Requests: ADEQUATE
Special Requests: ADEQUATE

## 2018-05-24 LAB — BLOOD GAS, ARTERIAL
Acid-Base Excess: 2.8 mmol/L — ABNORMAL HIGH (ref 0.0–2.0)
Bicarbonate: 27.7 mmol/L (ref 20.0–28.0)
Drawn by: 511551
FIO2: 40
LHR: 35 {breaths}/min
MECHVT: 320 mL
O2 Saturation: 95.9 %
PEEP: 8 cmH2O
Patient temperature: 98.6
pCO2 arterial: 49.9 mmHg — ABNORMAL HIGH (ref 32.0–48.0)
pH, Arterial: 7.363 (ref 7.350–7.450)
pO2, Arterial: 77.9 mmHg — ABNORMAL LOW (ref 83.0–108.0)

## 2018-05-24 LAB — POCT I-STAT 7, (LYTES, BLD GAS, ICA,H+H)
Acid-Base Excess: 1 mmol/L (ref 0.0–2.0)
BICARBONATE: 28.2 mmol/L — AB (ref 20.0–28.0)
Calcium, Ion: 1.18 mmol/L (ref 1.15–1.40)
HEMATOCRIT: 25 % — AB (ref 36.0–46.0)
Hemoglobin: 8.5 g/dL — ABNORMAL LOW (ref 12.0–15.0)
O2 Saturation: 82 %
Patient temperature: 37.4
Potassium: 3.7 mmol/L (ref 3.5–5.1)
Sodium: 146 mmol/L — ABNORMAL HIGH (ref 135–145)
TCO2: 30 mmol/L (ref 22–32)
pCO2 arterial: 63 mmHg — ABNORMAL HIGH (ref 32.0–48.0)
pH, Arterial: 7.261 — ABNORMAL LOW (ref 7.350–7.450)
pO2, Arterial: 55 mmHg — ABNORMAL LOW (ref 83.0–108.0)

## 2018-05-24 LAB — COMPREHENSIVE METABOLIC PANEL
ALT: 16 U/L (ref 0–44)
AST: 51 U/L — AB (ref 15–41)
Albumin: 1.4 g/dL — ABNORMAL LOW (ref 3.5–5.0)
Alkaline Phosphatase: 81 U/L (ref 38–126)
Anion gap: 10 (ref 5–15)
BUN: 6 mg/dL (ref 6–20)
CO2: 25 mmol/L (ref 22–32)
Calcium: 7.4 mg/dL — ABNORMAL LOW (ref 8.9–10.3)
Chloride: 112 mmol/L — ABNORMAL HIGH (ref 98–111)
Creatinine, Ser: 0.73 mg/dL (ref 0.44–1.00)
GFR calc Af Amer: >60 mL/min (ref >60)
GFR calc non Af Amer: >60 mL/min (ref >60)
Glucose, Bld: 85 mg/dL (ref 70–99)
POTASSIUM: 3.5 mmol/L (ref 3.5–5.1)
Sodium: 147 mmol/L — ABNORMAL HIGH (ref 135–145)
Total Bilirubin: 0.8 mg/dL (ref 0.3–1.2)
Total Protein: 4.4 g/dL — ABNORMAL LOW (ref 6.5–8.1)

## 2018-05-24 LAB — GLUCOSE, CAPILLARY
Glucose-Capillary: 80 mg/dL (ref 70–99)
Glucose-Capillary: 84 mg/dL (ref 70–99)
Glucose-Capillary: 90 mg/dL (ref 70–99)
Glucose-Capillary: 82 mg/dL (ref 70–99)
Glucose-Capillary: 84 mg/dL (ref 70–99)

## 2018-05-24 LAB — PHOSPHORUS: Phosphorus: 2.2 mg/dL — ABNORMAL LOW (ref 2.5–4.6)

## 2018-05-24 LAB — MAGNESIUM: MAGNESIUM: 1.8 mg/dL (ref 1.7–2.4)

## 2018-05-24 MED ORDER — PHENYLEPHRINE HCL 10 MG/ML IJ SOLN
0.0000 ug/min | INTRAVENOUS | Status: DC
  Administered 2018-05-24 (×2): 100 ug/min via INTRAVENOUS
  Administered 2018-05-24: 120 ug/min via INTRAVENOUS
  Administered 2018-05-24: 110 ug/min via INTRAVENOUS
  Administered 2018-05-25: 70 ug/min via INTRAVENOUS
  Administered 2018-05-25: 20 ug/min via INTRAVENOUS
  Administered 2018-05-25: 110 ug/min via INTRAVENOUS
  Administered 2018-05-25: 70 ug/min via INTRAVENOUS
  Filled 2018-05-24 (×15): qty 4

## 2018-05-24 MED ORDER — FUROSEMIDE 10 MG/ML IJ SOLN
20.0000 mg | Freq: Four times a day (QID) | INTRAMUSCULAR | Status: AC
  Administered 2018-05-24 (×2): 20 mg via INTRAVENOUS
  Filled 2018-05-24 (×2): qty 2

## 2018-05-24 MED ORDER — POTASSIUM PHOSPHATES 15 MMOLE/5ML IV SOLN
20.0000 mmol | Freq: Once | INTRAVENOUS | Status: AC
  Administered 2018-05-24: 20 mmol via INTRAVENOUS
  Filled 2018-05-24: qty 6.67

## 2018-05-24 NOTE — Progress Notes (Signed)
Pt. accidentally received 75-120mls of nimbex via IV by [facility] vascular team during sterile procedure in which she was receiving a PICC line. Pt.'s BP dropped but it was quickly noticed that the wrong med was hung (nimbex, instead of neo), it was stopped, neo was restarted and the pt.'s BP began to increase. Pt. was adequately sedated prior to error. MD notified and expressed he will be notifying the family.

## 2018-05-24 NOTE — Progress Notes (Signed)
NAME:  Christine Ortega, MRN:  [id number], DOB:  1960/05/20, LOS: 5 ADMISSION DATE:  05/19/2018, CONSULTATION DATE: 2 15 2020 REFERRING MD: GI services, CHIEF COMPLAINT: GI bleed, pneumonia, blood loss anemia shock  Brief History   57 year old with UGI bleed intubated for endoscopy s/p IR embolization of gastroduodenal artery 2/16 for large bleeding gastric ulcer. She had bilateral infiltrates and 3 L oxygen requirement on admission, presumptively diagnosed as community-acquired pneumonia. Intubated   Past Medical History  Attention deficit disorder, Schizophrenia, Bipolar, History of chronic opioid abuse History of tobacco abuse, Overuse of NSAIDs  Significant Hospital Events   05/19/2018 due to excessive GI bleeding transferred to intensive care unit with plan for endoscopy. 2/15 - Flu PCR - neg 2/16 >> change from Precedex to propofol; extubated. 2/18 increasing FIO2 needs. Failed BIPAP; reintubated.  2/19 still hypoxic. CXR w/ diffuse airspace disease. PPlats 38 so titrated down to 6 cc/kg PBW.   Consults:  05/19/2018 pulmonary critical care 05/19/2018 GI and IR  Procedures:  2/15 ETT >> 2/16 2/15 R femoral cordis >>2/18 -pt pulled out 2/15 left femoral aline >> 2/16 > embolization of GDA  Significant Diagnostic Tests:  05/19/2018 endoscopy>>  - Normal esophagus. - Red blood in the entire stomach with significant clot burden - as above time not taken to clear the entire stomach due to obvious pathology at the antrum. It is possible there is pathology in the fundus / proximal stomach to cause bleeding but would not have been visualized on this exam. - Giant gastric ulcer at the antrum entering duodenal bulb - not amenable to endoscopic therapy given appearance and poor ability to visualize the area (of note, hemospray currently not available due to FDA recall). The procedure was aborted due to tenous status with need for more definitive therapy - Blood in the entire examined  duodenum. This patient is having life threatening bleeding presumed due to giant gastric ulcer which did not appear amenable to endoscopic therapy. IR was called for attempt at embolization. 2/16 - Post mesenteric arteriogram and empiric embolization of the GDA.   2/18 reintubated. For on-going resp failure.  2/19: Chest x-ray fairly stable.  Increasing PEEP and FiO2 requirements out of proportion to chest x-ray changes.  CT chest ordered to rule out pulmonary emboli > negative.  Micro Data:  05/19/2018 blood cultures x2>> ng 05/19/2018 urine culture >>  >100k e.Coli >> sens to CTX 05/19/2018 sputum culture >> nl resp flora 2/15 RVP - neg  Antimicrobials:  05/19/2018 ceftriaxone >> 05/19/2018 Zithromax >> 2/17  Interim history/subjective:  Lower CBG overnight. Sedated on vent this am.  Objective   Blood pressure (!) 108/37, pulse 65, temperature 99.3 F (37.4 C), resp. rate (!) 35, height 5\' 3"  (1.6 m), weight 67.2 kg, SpO2 95 %.    Vent Mode: PRVC FiO2 (%):  [40 %-60 %] 40 % Set Rate:  [30 bmp-35 bmp] 35 bmp Vt Set:  [320 mL-420 mL] 320 mL PEEP:  [5 cmH20-10 cmH20] 5 cmH20 Plateau Pressure:  [24 cmH20-29 cmH20] 24 cmH20   Intake/Output Summary (Last 24 hours) at 05/24/2018 0903 Last data filed at 05/24/2018 0853 Gross per 24 hour  Intake 3827.72 ml  Output 989.5 ml  Net 2838.22 ml   Filed Weights   05/19/18 0650 05/21/18 0308  Weight: 63.5 kg 67.2 kg   General: sedated on vent HEENT ETT in place, MMM Pulmonary: CTAB  Cardiac: RRR, no murmur Abdomen: soft, +BS Extremities: warm and dry Neuro: sedated  Resolved  Hospital Problem list   Hemorrhagic shock 2/2 UGIB: s/p 7 units PRBC, Hgb 4.1 >> 11 >> now stable 8.5  Assessment & Plan:   Acute hypoxic respiratory failure: Suspected CAP vs aspiration,  concerning for ARDS pulmonary edema +/- TRALI/ TACO Multifactorial related to ABLA, shock, procedure. Extubated 2/17; re-intubated 2/19. CXR this am with improved aeration  although persistent opacities. CT yesterday negative for PE. Vent changed to ARDS settings yesterday. ABG this am improved. Still 9 liters +  Plan PAD protocol RASS goal -3 and adjust to -5 if NMB started VAP bundle Day #6 ceftriaxone, likely stop at day 8  Acute UGIB w/ acute blood loss anemia  - found to have bleeding gastric ulcer 2/2 NSAID use s/p IR embolization of GDA  -GI following -h pylori was equivocal  -hgb stable Plan Cont daily CBC PPI BID SCDs  History attention deficit disorder, bipolar, depression, schizophrenia chronic pain Acute Encephalopathy/ Delirium  Plan Cont depakote   Intermittent Fluid and electrolyte imbalance Plan Trend Chemistries  Replace as indicated   QTc prolongation Plan Avoid QTc prolonging meds  Sepsis, Leukocytosis/ fever - suspected 2/2 CAP +/- UTI. Improving, no longer febrile. UCx >100k ecoli. Plan Continue abx as above Continue pressors, wean as tolerated  Elevated trop w/ ECHO suggesting increased RV pressures but nml EF 2/19 Plan Trops downtrended. Cont supportive care  Best practice:  Diet: NPO Pain/Anxiety/Delirium protocol (if indicated): fentanyl, propofol  VAP protocol (if indicated): n/a DVT prophylaxis: SCD- no anticoagulation with GI bleed GI prophylaxis: PPI drip Glucose control: cbg q 4 Mobility: Bedrest Code Status: Full Family Communication: no family at bedside 2/20. Disposition: ICU remains critically ill.  Needs close observation of plateau pressures, adjustment of ventilation  Rory Percy, DO PGY-2, [place] [facility] 05/24/2018 9:03 AM

## 2018-05-24 NOTE — Progress Notes (Addendum)
[place] Progress Note Patient Name: Christine Ortega DOB: 09/09/1960 MRN: [id number]   Date of Service  05/24/2018  HPI/Events of Note  BG 72. Asking for dextrose. NPO/NG tube. GI bleed.  On Neosynephrine via dextrose.   eICU Interventions  - ok to quadruple Neosynephrine in dextrose, would help fluid restriction for ARDS and for low glucose.      Intervention Category Intermediate Interventions: Other:  Elmer Sow 05/24/2018, 12:12 AM

## 2018-05-24 NOTE — Progress Notes (Signed)
[place] Progress Note Patient Name: Christine Ortega DOB: [birth year] MRN: [id number]   Date of Service  05/24/2018  HPI/Events of Note  [facility] asking for NG or OG tube insertion or not. Discussed with bed side RN. She is going to ask GI for this approval.  Recent embolization for GI bleeding.   eICU Interventions  As above     Intervention Category Minor Interventions: Other:  Elmer Sow 05/24/2018, 6:37 AM

## 2018-05-24 NOTE — Progress Notes (Signed)
eLink Physician-Brief Progress Note Patient Name: Christine Ortega DOB: 02/05/1961 MRN: [id number]   Date of Service  05/24/2018  HPI/Events of Note  K/po4 low. Cr normal.  eICU Interventions  Kphos 20 mmols IV once.      Intervention Category Minor Interventions: Electrolytes abnormality - evaluation and management  Elmer Sow 05/24/2018, 5:49 AM

## 2018-05-24 NOTE — Progress Notes (Signed)
PT Cancellation Note  Patient Details Name: Christine Ortega MRN: [id number] DOB: 14-Oct-1960   Cancelled Treatment:    Reason Eval/Treat Not Completed: Medical issues which prohibited therapy Patient cont intubated and lethargic.    Reinaldo Berber, PT, DPT Acute Rehabilitation Services Pager: [phone] Office: [place] 05/24/2018, 4:25 PM

## 2018-05-25 ENCOUNTER — Inpatient Hospital Stay (HOSPITAL_COMMUNITY): Payer: Medicare Other

## 2018-05-25 ENCOUNTER — Inpatient Hospital Stay: Payer: Self-pay

## 2018-05-25 DIAGNOSIS — K631 Perforation of intestine (nontraumatic): Secondary | ICD-10-CM

## 2018-05-25 LAB — CBC WITH DIFFERENTIAL/PLATELET
Abs Immature Granulocytes: 0.08 10*3/uL — ABNORMAL HIGH (ref 0.00–0.07)
BASOS PCT: 1 %
Basophils Absolute: 0.1 10*3/uL (ref 0.0–0.1)
Eosinophils Absolute: 1.4 10*3/uL — ABNORMAL HIGH (ref 0.0–0.5)
Eosinophils Relative: 10 %
HCT: 30.5 % — ABNORMAL LOW (ref 36.0–46.0)
Hemoglobin: 9 g/dL — ABNORMAL LOW (ref 12.0–15.0)
Immature Granulocytes: 1 %
Lymphocytes Relative: 18 %
Lymphs Abs: 2.6 10*3/uL (ref 0.7–4.0)
MCH: 28.4 pg (ref 26.0–34.0)
MCHC: 29.5 g/dL — ABNORMAL LOW (ref 30.0–36.0)
MCV: 96.2 fL (ref 80.0–100.0)
Monocytes Absolute: 0.7 10*3/uL (ref 0.1–1.0)
Monocytes Relative: 5 %
NRBC: 0.1 % (ref 0.0–0.2)
Neutro Abs: 9.5 10*3/uL — ABNORMAL HIGH (ref 1.7–7.7)
Neutrophils Relative %: 65 %
Platelets: 132 10*3/uL — ABNORMAL LOW (ref 150–400)
RBC: 3.17 MIL/uL — AB (ref 3.87–5.11)
RDW: 17.9 % — ABNORMAL HIGH (ref 11.5–15.5)
WBC: 14.4 10*3/uL — ABNORMAL HIGH (ref 4.0–10.5)

## 2018-05-25 LAB — BASIC METABOLIC PANEL
Anion gap: 7 (ref 5–15)
BUN: <5 mg/dL — ABNORMAL LOW (ref 6–20)
CALCIUM: 7.6 mg/dL — AB (ref 8.9–10.3)
CO2: 31 mmol/L (ref 22–32)
Chloride: 105 mmol/L (ref 98–111)
Creatinine, Ser: 0.72 mg/dL (ref 0.44–1.00)
GFR calc non Af Amer: >60 mL/min (ref >60)
Glucose, Bld: 94 mg/dL (ref 70–99)
Potassium: 3.3 mmol/L — ABNORMAL LOW (ref 3.5–5.1)
SODIUM: 143 mmol/L (ref 135–145)

## 2018-05-25 LAB — GLUCOSE, CAPILLARY
GLUCOSE-CAPILLARY: 84 mg/dL (ref 70–99)
Glucose-Capillary: 103 mg/dL — ABNORMAL HIGH (ref 70–99)
Glucose-Capillary: 75 mg/dL (ref 70–99)
Glucose-Capillary: 81 mg/dL (ref 70–99)
Glucose-Capillary: 84 mg/dL (ref 70–99)
Glucose-Capillary: 84 mg/dL (ref 70–99)
Glucose-Capillary: 85 mg/dL (ref 70–99)

## 2018-05-25 LAB — PHOSPHORUS: Phosphorus: 2.8 mg/dL (ref 2.5–4.6)

## 2018-05-25 LAB — MAGNESIUM: Magnesium: 1.4 mg/dL — ABNORMAL LOW (ref 1.7–2.4)

## 2018-05-25 LAB — TRIGLYCERIDES
TRIGLYCERIDES: 169 mg/dL — AB (ref <150)
Triglycerides: 187 mg/dL — ABNORMAL HIGH (ref <150)

## 2018-05-25 MED ORDER — CHLORHEXIDINE GLUCONATE 4 % EX LIQD
CUTANEOUS | Status: AC
  Filled 2018-05-25: qty 15

## 2018-05-25 MED ORDER — VITAL HIGH PROTEIN PO LIQD: 1000.0000 mL | ORAL | Status: DC

## 2018-05-25 MED ORDER — VITAL AF 1.2 CAL PO LIQD
1000.0000 mL | ORAL | Status: DC
  Administered 2018-05-25 – 2018-05-30 (×6): 1000 mL

## 2018-05-25 MED ORDER — PROPOFOL 1000 MG/100ML IV EMUL
0.0000 ug/kg/min | INTRAVENOUS | Status: DC
  Administered 2018-05-25: 20 ug/kg/min via INTRAVENOUS
  Administered 2018-05-26: 25 ug/kg/min via INTRAVENOUS
  Filled 2018-05-25: qty 100

## 2018-05-25 MED ORDER — SENNA 8.6 MG PO TABS
1.0000 | ORAL_TABLET | Freq: Every day | ORAL | Status: DC | PRN
  Administered 2018-05-27: 8.6 mg via ORAL
  Filled 2018-05-25 (×2): qty 1

## 2018-05-25 MED ORDER — BISACODYL 10 MG RE SUPP
10.0000 mg | Freq: Every day | RECTAL | Status: DC | PRN
  Administered 2018-05-28: 10 mg via RECTAL
  Filled 2018-05-25: qty 1

## 2018-05-25 MED ORDER — POTASSIUM CHLORIDE 10 MEQ/100ML IV SOLN
10.0000 meq | INTRAVENOUS | Status: AC
  Administered 2018-05-25 (×4): 10 meq via INTRAVENOUS
  Filled 2018-05-25 (×4): qty 100

## 2018-05-25 MED ORDER — FENTANYL CITRATE (PF) 100 MCG/2ML IJ SOLN
100.0000 ug | INTRAMUSCULAR | Status: DC | PRN
  Administered 2018-05-25 (×3): 100 ug via INTRAVENOUS

## 2018-05-25 MED ORDER — PHENYLEPHRINE HCL-NACL 40-0.9 MG/250ML-% IV SOLN
0.0000 ug/min | INTRAVENOUS | Status: DC
  Administered 2018-05-25: 30 ug/min via INTRAVENOUS
  Administered 2018-05-26: 20 ug/min via INTRAVENOUS
  Filled 2018-05-25 (×3): qty 250

## 2018-05-25 MED ORDER — FENTANYL CITRATE (PF) 100 MCG/2ML IJ SOLN: 100.0000 ug | INTRAMUSCULAR | Status: DC | PRN

## 2018-05-25 MED ORDER — PRO-STAT SUGAR FREE PO LIQD
30.0000 mL | Freq: Two times a day (BID) | ORAL | Status: DC
  Administered 2018-05-25: 30 mL
  Filled 2018-05-25: qty 30

## 2018-05-25 MED ORDER — MIDAZOLAM HCL 2 MG/2ML IJ SOLN: 2.0000 mg | INTRAMUSCULAR | Status: DC | PRN

## 2018-05-25 MED ORDER — FUROSEMIDE 10 MG/ML IJ SOLN
20.0000 mg | Freq: Four times a day (QID) | INTRAMUSCULAR | Status: AC
  Administered 2018-05-25 (×2): 20 mg via INTRAVENOUS
  Filled 2018-05-25 (×2): qty 2

## 2018-05-25 NOTE — Progress Notes (Signed)
Nutrition Follow-up  DOCUMENTATION CODES:   Not applicable  INTERVENTION:  Vital AF 1.2 at goal rate of 30ml/hr to provide 1728 kcal, 108g protein, and 1141ml free water. (95% kcal needs, 100% protein needs)  Propofol at 59ml/hr will provide 132 non-protein kcal.   NUTRITION DIAGNOSIS:   Inadequate oral intake related to inability to eat as evidenced by NPO status.  Ongoing  GOAL:   Provide needs based on ASPEN/SCCM guidelines  Unmet  MONITOR:   Labs, I & O's, TF tolerance, Skin, Vent status  ASSESSMENT:   58 year old female with a PMH of narcotic abuse, hypokalemia, anxiety, and bipolar disorder. Transferred from [facility] 05/19/2018 after developing a GI Bleed blood loss anemia. EGD performed and revealed giant gastric ulcer at the antrum entering the duodenal bulb.  2/16 Embolization of GDA, GI bleeding controlled 2/17 Extubated 2/18 Re-intubated d/t increasing respiratory insufficiency 2/21 Cortrak placed  Spoke w/ RN who reported that Cortrak placement went well. RN also said that they have been decreasing propofol each hour and will now turn the propofol off for right now. If they do continue it it will be at a low dose (5-56ml/hr).  Was previously running at 10.51ml/hr.   MV: 7.5 L/min MAP (a-line): 60s Temp (24hrs), Avg:99.4 F (37.4 C), Min:98.4 F (36.9 C), Max:100.6 F (38.1 C)  Medications reviewed and include: phenylephrine Labs reviewed: K 3.3 (L)  Diet Order:   Diet Order            Diet NPO time specified  Diet effective now              EDUCATION NEEDS:   No education needs have been identified at this time  Skin:  Skin Assessment: Reviewed RN Assessment  Last BM:  2/19  Height:   Ht Readings from Last 1 Encounters:  05/19/18 5\' 3"  (1.6 m)    Weight:   Wt Readings from Last 1 Encounters:  05/21/18 67.2 kg    Ideal Body Weight:  52.27 kg  BMI:  Body mass index is 26.24 kg/m.  Estimated Nutritional Needs:    Kcal:  1818 kcal  Protein:  100-115 g  Fluid:  >/=1.[place] Dietetic Intern

## 2018-05-25 NOTE — Progress Notes (Signed)
At bedside with Claretha Cooper, RN to assess patient for new PICC on right side due to malposition of current PICC on left.  Right basilic vein was non-compressible.  Once right basilic joined medial brachial vein, it continued to be non compressible up to axilla.  Lateral brachial vein with catheter to vein ratio of 61% therefore unable to use due to small size.  Attempted reposition of current PICC with repositioning patient slightly to right and power flushed both ports with no IV medications infusing with total of 60 cc NS for two separate attempts.  CXR obtained with each attempt for PICC position to now be over SVC per radiologist.  CXR viewed by Dr. Lake Bells okay to leave PICC at present position.  Will continue to monitor.  Carolee Rota, RN VAST

## 2018-05-25 NOTE — Progress Notes (Signed)
NAME:  Christine Ortega, MRN:  [id number], DOB:  09-23-1960, LOS: 6 ADMISSION DATE:  05/19/2018, CONSULTATION DATE: 2 15 2020 REFERRING MD: GI services, CHIEF COMPLAINT: GI bleed, pneumonia, blood loss anemia shock  Brief History   58 year old with UGI bleed intubated for endoscopy s/p IR embolization of gastroduodenal artery 2/16 for large bleeding gastric ulcer. She had bilateral infiltrates and 3 L oxygen requirement on admission, presumptively diagnosed as community-acquired pneumonia. Intubated.   Past Medical History  Attention deficit disorder, Schizophrenia, Bipolar, History of chronic opioid abuse, History of tobacco abuse, Overuse of NSAIDs  Significant Hospital Events   05/19/2018 due to excessive GI bleeding transferred to intensive care unit with plan for endoscopy. 2/15 - Flu PCR - neg 2/16 >> change from Precedex to propofol; extubated. 2/18 increasing FIO2 needs. Failed BIPAP; reintubated.  2/19 still hypoxic. CXR w/ diffuse airspace disease. PPlats 38 so titrated down to 6 cc/kg PBW.   Consults:  05/19/2018 pulmonary critical care 05/19/2018 GI and IR  Procedures:  2/15 ETT >> 2/16 2/15 R femoral cordis >>2/18 -pt pulled out 2/15 left femoral aline >> 2/16 > embolization of GDA  Significant Diagnostic Tests:  05/19/2018 endoscopy>>  - Normal esophagus. - Red blood in the entire stomach with significant clot burden - as above time not taken to clear the entire stomach due to obvious pathology at the antrum. It is possible there is pathology in the fundus / proximal stomach to cause bleeding but would not have been visualized on this exam. - Giant gastric ulcer at the antrum entering duodenal bulb - not amenable to endoscopic therapy given appearance and poor ability to visualize the area (of note, hemospray currently not available due to FDA recall). The procedure was aborted due to tenous status with need for more definitive therapy - Blood in the entire examined  duodenum. This patient is having life threatening bleeding presumed due to giant gastric ulcer which did not appear amenable to endoscopic therapy. IR was called for attempt at embolization. 2/16 - Post mesenteric arteriogram and empiric embolization of the GDA.   2/18 reintubated. For on-going resp failure.  2/19: Chest x-ray fairly stable.  Increasing PEEP and FiO2 requirements out of proportion to chest x-ray changes.  CT chest ordered to rule out pulmonary emboli > negative.  Micro Data:  05/19/2018 blood cultures x2>> ng 05/19/2018 urine culture >>  >100k e.Coli >> sens to CTX 05/19/2018 sputum culture >> nl resp flora 2/15 RVP - neg  Antimicrobials:  05/19/2018 ceftriaxone >> 05/19/2018 Zithromax >> 2/17  Interim history/subjective:  Febrile overnight.  Objective   Blood pressure (!) 120/47, pulse 66, temperature 99.3 F (37.4 C), temperature source Core, resp. rate (!) 24, height 5\' 3"  (1.6 m), weight 67.2 kg, SpO2 99 %.    Vent Mode: PRVC FiO2 (%):  [40 %-50 %] 40 % Set Rate:  [24 bmp-35 bmp] 24 bmp Vt Set:  [320 mL] 320 mL PEEP:  [5 cmH20-8 cmH20] 8 cmH20 Plateau Pressure:  [17 cmH20-26 cmH20] 26 cmH20   Intake/Output Summary (Last 24 hours) at 05/25/2018 0745 Last data filed at 05/25/2018 0416 Gross per 24 hour  Intake 2443.05 ml  Output 4043 ml  Net -1599.95 ml   Filed Weights   05/19/18 0650 05/21/18 0308  Weight: 63.5 kg 67.2 kg   General: sedated on vent HEENT: ETT in place Pulmonary: rhonchorous throughout  Cardiac: RRR Abdomen: soft, +BS, non distended Extremities: warm, dry Neuro: sedated on vent  Resolved Hospital Problem list  Hemorrhagic shock 2/2 UGIB: s/p 7 units PRBC, Hgb 4.1 >> 11 >> now stable 8.5  Assessment & Plan:   Acute hypoxic respiratory failure: Suspected CAP vs aspiration,  concerning for ARDS pulmonary edema +/- TRALI/ TACO Multifactorial related to ABLA, shock, procedure. Extubated 2/17; re-intubated 2/19. No new CXR. ABG slightly  worsened. Good UOP with addition of Lasix yesterday. Net +7L. Plan PAD protocol RASS goal -3 to -4 VAP bundle Day #7 ceftriaxone, likely stop at day 8 Consider reculturing if fever sustained  Acute UGIB w/ acute blood loss anemia  - found to have bleeding gastric ulcer 2/2 NSAID use s/p IR embolization of GDA  -GI following -h pylori was equivocal  -hgb stable - plan for cortrak placement today Plan Cont daily CBC PPI BID SCDs  History attention deficit disorder, bipolar, depression, schizophrenia chronic pain Acute Encephalopathy/ Delirium  Plan Cont depakote   Intermittent Fluid and electrolyte imbalance Plan Trend Chemistries  Replace as indicated   QTc prolongation Plan Avoid QTc prolonging meds  Sepsis, Leukocytosis/ fever Suspected 2/2 CAP, UTI. UCx >100k ecoli. Febrile overnight though not sustained. Plan Continue abx as above. Consider reculturing if fever sustained. Continue pressors, wean as tolerated  Elevated trop w/ ECHO suggesting increased RV pressures but nml EF 2/19 Plan Trops downtrended. Cont supportive care  Best practice:  Diet: NPO Pain/Anxiety/Delirium protocol (if indicated): fentanyl, propofol  VAP protocol (if indicated): n/a DVT prophylaxis: SCD- no anticoagulation with GI bleed GI prophylaxis: PPI drip Glucose control: cbg q 4 Mobility: Bedrest Code Status: Full Family Communication: no family at bedside 2/21 Disposition: ICU remains critically ill.    Rory Percy, DO PGY-2, [facility] 05/25/2018 7:45 AM

## 2018-05-25 NOTE — Progress Notes (Signed)
PT Cancellation Note  Patient Details Name: Christine Ortega MRN: [id number] DOB: [birth year]   Cancelled Treatment:    Reason Eval/Treat Not Completed: Medical issues which prohibited therapy Discussed with RN, not ready sedated. Will sign off for time being, please re-consult when medically appropriate.   Reinaldo Berber, PT, DPT Acute Rehabilitation Services Pager: [phone] Office: [place] 05/25/2018, 1:58 PM

## 2018-05-25 NOTE — Progress Notes (Signed)
5 fr sheath was pulled at 3:24pm.  Manual pressure was applied and a V-pad was placed over the incision site.  Hemostasis was achieved at 15:41.  Site was clean and dry and no hematomas were visualized.  Site was handed off to April RN.

## 2018-05-25 NOTE — Progress Notes (Signed)
eLink Physician-Brief Progress Note Patient Name: Christine Ortega DOB: February 03, 1961 MRN: [id number]   Date of Service  05/25/2018  HPI/Events of Note    eICU Interventions  AM labs ordered.     Intervention Category Minor Interventions: Other:  Elsie Lincoln 05/25/2018, 3:53 AM

## 2018-05-25 NOTE — Procedures (Signed)
Cortrak  Person Inserting Tube:  Christine Ortega, RD Tube Type:  Cortrak - 43 inches Tube Location:  Right nare Initial Placement:  Stomach Secured by: Bridle Technique Used to Measure Tube Placement:  Documented cm marking at nare/ corner of mouth Cortrak Secured At:  65 cm    Cortrak Tube Team Note:  Consult received to place a Cortrak feeding tube.  Noted pt with recent large gastric ulcer embolization.  X-ray ordered, abdominal x-ray has been ordered by the Cortrak team. Please confirm tube placement before using the Cortrak tube. Discussed with RN.   If the tube becomes dislodged please keep the tube and contact the Cortrak team at [email or web address] (password [email or web address]) for replacement.  If after hours and replacement cannot be delayed, place a NG tube and confirm placement with an abdominal x-ray.    [place], [place], [place] Pager [phone] After Hours Pager

## 2018-05-26 ENCOUNTER — Inpatient Hospital Stay (HOSPITAL_COMMUNITY): Payer: Medicare Other

## 2018-05-26 LAB — CBC WITH DIFFERENTIAL/PLATELET
Abs Immature Granulocytes: 0.03 10*3/uL (ref 0.00–0.07)
Basophils Absolute: 0.1 10*3/uL (ref 0.0–0.1)
Basophils Relative: 1 %
Eosinophils Absolute: 0.4 10*3/uL (ref 0.0–0.5)
Eosinophils Relative: 4 %
HCT: 29.8 % — ABNORMAL LOW (ref 36.0–46.0)
Hemoglobin: 9 g/dL — ABNORMAL LOW (ref 12.0–15.0)
Immature Granulocytes: 0 %
Lymphocytes Relative: 14 %
Lymphs Abs: 1.6 10*3/uL (ref 0.7–4.0)
MCH: 29.2 pg (ref 26.0–34.0)
MCHC: 30.2 g/dL (ref 30.0–36.0)
MCV: 96.8 fL (ref 80.0–100.0)
MONOS PCT: 6 %
Monocytes Absolute: 0.7 10*3/uL (ref 0.1–1.0)
Neutro Abs: 8.2 10*3/uL — ABNORMAL HIGH (ref 1.7–7.7)
Neutrophils Relative %: 75 %
Platelets: 134 10*3/uL — ABNORMAL LOW (ref 150–400)
RBC: 3.08 MIL/uL — ABNORMAL LOW (ref 3.87–5.11)
RDW: 17.2 % — ABNORMAL HIGH (ref 11.5–15.5)
WBC: 10.9 10*3/uL — ABNORMAL HIGH (ref 4.0–10.5)
nRBC: 0 % (ref 0.0–0.2)

## 2018-05-26 LAB — GLUCOSE, CAPILLARY
GLUCOSE-CAPILLARY: 150 mg/dL — AB (ref 70–99)
GLUCOSE-CAPILLARY: 83 mg/dL (ref 70–99)
Glucose-Capillary: 129 mg/dL — ABNORMAL HIGH (ref 70–99)
Glucose-Capillary: 125 mg/dL — ABNORMAL HIGH (ref 70–99)
Glucose-Capillary: 282 mg/dL — ABNORMAL HIGH (ref 70–99)
Glucose-Capillary: 128 mg/dL — ABNORMAL HIGH (ref 70–99)

## 2018-05-26 LAB — BASIC METABOLIC PANEL
Anion gap: 8 (ref 5–15)
BUN: 7 mg/dL (ref 6–20)
CHLORIDE: 101 mmol/L (ref 98–111)
CO2: 36 mmol/L — ABNORMAL HIGH (ref 22–32)
Calcium: 7.6 mg/dL — ABNORMAL LOW (ref 8.9–10.3)
Creatinine, Ser: 0.72 mg/dL (ref 0.44–1.00)
GFR calc Af Amer: >60 mL/min (ref >60)
GFR calc non Af Amer: >60 mL/min (ref >60)
GLUCOSE: 95 mg/dL (ref 70–99)
Potassium: 3.5 mmol/L (ref 3.5–5.1)
Sodium: 145 mmol/L (ref 135–145)

## 2018-05-26 LAB — PHOSPHORUS
Phosphorus: 3 mg/dL (ref 2.5–4.6)
Phosphorus: 1.6 mg/dL — ABNORMAL LOW (ref 2.5–4.6)

## 2018-05-26 LAB — MAGNESIUM
Magnesium: 1.3 mg/dL — ABNORMAL LOW (ref 1.7–2.4)
Magnesium: 1.9 mg/dL (ref 1.7–2.4)

## 2018-05-26 MED ORDER — ACETAMINOPHEN 325 MG PO TABS
650.0000 mg | ORAL_TABLET | Freq: Four times a day (QID) | ORAL | Status: DC | PRN
  Administered 2018-05-26 – 2018-06-03 (×17): 650 mg
  Filled 2018-05-26 (×19): qty 2

## 2018-05-26 MED ORDER — FENTANYL CITRATE (PF) 100 MCG/2ML IJ SOLN
100.0000 ug | INTRAMUSCULAR | Status: DC | PRN
  Administered 2018-05-26 – 2018-05-28 (×6): 100 ug via INTRAVENOUS
  Filled 2018-05-26 (×5): qty 2

## 2018-05-26 MED ORDER — MAGNESIUM SULFATE 4 GM/100ML IV SOLN
4.0000 g | Freq: Once | INTRAVENOUS | Status: AC
  Administered 2018-05-26: 4 g via INTRAVENOUS
  Filled 2018-05-26: qty 100

## 2018-05-26 MED ORDER — DEXMEDETOMIDINE HCL IN NACL 400 MCG/100ML IV SOLN
0.0000 ug/kg/h | INTRAVENOUS | Status: DC
  Administered 2018-05-26: 1.1 ug/kg/h via INTRAVENOUS
  Administered 2018-05-26: 1 ug/kg/h via INTRAVENOUS
  Administered 2018-05-26: 0.4 ug/kg/h via INTRAVENOUS
  Administered 2018-05-27 (×2): 0.8 ug/kg/h via INTRAVENOUS
  Administered 2018-05-27: 1 ug/kg/h via INTRAVENOUS
  Administered 2018-05-27: 0.9 ug/kg/h via INTRAVENOUS
  Administered 2018-05-28: 0.8 ug/kg/h via INTRAVENOUS
  Filled 2018-05-26 (×8): qty 100

## 2018-05-26 MED ORDER — FENTANYL CITRATE (PF) 100 MCG/2ML IJ SOLN
100.0000 ug | INTRAMUSCULAR | Status: DC | PRN
  Filled 2018-05-26: qty 2

## 2018-05-26 MED ORDER — FUROSEMIDE 10 MG/ML IJ SOLN
20.0000 mg | Freq: Four times a day (QID) | INTRAMUSCULAR | Status: AC
  Administered 2018-05-26 (×2): 20 mg via INTRAVENOUS
  Filled 2018-05-26 (×2): qty 2

## 2018-05-26 MED ORDER — POTASSIUM CHLORIDE 20 MEQ/15ML (10%) PO SOLN
40.0000 meq | Freq: Once | ORAL | Status: AC
  Administered 2018-05-26: 40 meq
  Filled 2018-05-26: qty 30

## 2018-05-26 NOTE — Progress Notes (Signed)
NAME:  Christine Ortega, MRN:  [id number], DOB:  [birth year], LOS: 7 ADMISSION DATE:  05/19/2018, CONSULTATION DATE: 2 15 2020 REFERRING MD: GI services, CHIEF COMPLAINT: GI bleed, pneumonia, blood loss anemia shock  Brief History   58 year old with UGI bleed intubated for endoscopy s/p IR embolization of gastroduodenal artery 2/16 for large bleeding gastric ulcer. She had bilateral infiltrates and 3 L oxygen requirement on admission, presumptively diagnosed as community-acquired pneumonia. Intubated   Past Medical History  Attention deficit disorder, Schizophrenia, Bipolar, History of chronic opioid abuse History of tobacco abuse, Overuse of NSAIDs  Significant Hospital Events   05/19/2018 due to excessive GI bleeding transferred to intensive care unit with plan for endoscopy. 2/15 - Flu PCR - neg 2/16 >> change from Precedex to propofol; extubated. 2/18 increasing FIO2 needs. Failed BIPAP; reintubated.  2/19 still hypoxic. CXR w/ diffuse airspace disease. PPlats 38 so titrated down to 6 cc/kg PBW. CT obtained. Neg for PE. Was started on NMB.  2/20: off NMB. Still using high RASS goal of -3 to -4. FIO2 and peep down. Still on low dose pressors. Getting lasix.  2/21: FT placed. Diuresing  2/22 cont diuresis. Transition to precedex.  Consults:  05/19/2018 pulmonary critical care 05/19/2018 GI and IR  Procedures:  2/15 ETT >> 2/16 2/15 R femoral cordis >>2/18 -pt pulled out 2/15 left femoral aline >> 2/16 > embolization of GDA  Significant Diagnostic Tests:  05/19/2018 endoscopy>>  - Normal esophagus. - Red blood in the entire stomach with significant clot burden - as above time not taken to clear the entire stomach due to obvious pathology at the antrum. It is possible there is pathology in the fundus / proximal stomach to cause bleeding but would not have been visualized on this exam. - Giant gastric ulcer at the antrum entering duodenal bulb - not amenable to endoscopic therapy  given appearance and poor ability to visualize the area (of note, hemospray currently not available due to FDA recall). The procedure was aborted due to tenous status with need for more definitive therapy - Blood in the entire examined duodenum. This patient is having life threatening bleeding presumed due to giant gastric ulcer which did not appear amenable to endoscopic therapy. IR was called for attempt at embolization. 2/16 - Post mesenteric arteriogram and empiric embolization of the GDA.   2/18 reintubated. For on-going resp failure.  2/19: Chest x-ray fairly stable.  Increasing PEEP and FiO2 requirements out of proportion to chest x-ray changes.  CT chest ordered to rule out pulmonary emboli > negative.  Micro Data:  05/19/2018 blood cultures x2>> ng 05/19/2018 urine culture >>  >100k e.Coli >> sens to CTX 05/19/2018 sputum culture >> nl resp flora 2/15 RVP - neg  Antimicrobials:  05/19/2018 ceftriaxone >> 05/19/2018 Zithromax >> 2/17  Interim history/subjective:  Sedated, starting to wake up some FiO2 and PEEP requirements down  Objective   Blood pressure 107/62, pulse 84, temperature 98.6 F (37 C), resp. rate (Abnormal) 24, height 5\' 3"  (1.6 m), weight 68.3 kg, SpO2 99 %.    Vent Mode: PRVC FiO2 (%):  [40 %] 40 % Set Rate:  [24 bmp] 24 bmp Vt Set:  [320 mL] 320 mL PEEP:  [5 cmH20-8 cmH20] 5 cmH20 Plateau Pressure:  [22 cmH20-25 cmH20] 22 cmH20   Intake/Output Summary (Last 24 hours) at 05/26/2018 0742 Last data filed at 05/26/2018 0612 Gross per 24 hour  Intake 1343.33 ml  Output 2802.5 ml  Net -1459.17 ml  Filed Weights   05/19/18 0650 05/21/18 0308 05/26/18 0500  Weight: 63.5 kg 67.2 kg 68.3 kg  General: 59 year old white female currently sedated on mechanical ventilator HEENT normocephalic atraumatic orally intubated mucous membranes moist Pulmonary: Diffuse scattered rhonchi no accessory use synchronous on mechanical ventilation Cardiac: Regular rate and  rhythm Abdomen: Soft nontender no organomegaly Extremities: Dependent edema strong pulses she does appear to be developing bilateral foot drop pulses are strong Neuro: Opens eyes to stimulation, not moving no clear focal deficits appreciated GU: Clear yellow  Resolved Hospital Problem list   Hemorrhagic shock 2/2 UGIB: s/p 7 units PRBC, Hgb 4.1 >> 11 >> now stable 8.5  Assessment & Plan:   Acute hypoxic respiratory failure: Suspected CAP vs aspiration,  concerning for ARDS pulmonary edema +/- TRALI/ TACO Multifactorial related to ABLA, shock, procedure. Extubated 2/17; re-intubated 2/19.  Portable chest x-ray personally reviewed: Lateral diffuse airspace disease endotracheal tube in satisfactory position no significant change when comparing prior films this film was from 2/21 Plan Day 7 or 8 ceftriaxone PAD protocol RASS goal: -1 VAP bundle Lasix as she is still 5.7 L positive A.m. chest x-ray  Acute UGIB w/ acute blood loss anemia  - found to have bleeding gastric ulcer 2/2 NSAID use s/p IR embolization of GDA  -GI following -h pylori was equivocal  -Hemoglobin has been stable for over 72 hours Plan PPI BID Try to limit lab draws now that Hgb stable SCDs  Sedation related hypotension Plan Titrate Neo-Synephrine, mean arterial pressure goal greater than 65  History attention deficit disorder, bipolar, depression, schizophrenia chronic pain Acute Encephalopathy/ Delirium  Plan Cont depakote  Changing to Precedex with PRN fentanyl today 2/22  Intermittent Fluid and electrolyte imbalance: Borderline hypokalemia and currently has hypomagnesemia Plan trend chems and replace as needed   QTc prolongation Plan Avoid QTc agents   Ecoli UTI fvr curve better. WBC down Plan Continue abx as above Continue pressors, wean as tolerated  Elevated trop w/ ECHO suggesting increased RV pressures but nml EF 2/19 Plan Supportive care and tele for now   Foot drop Plan Prevalon  boot  Best practice:  Diet: NPO Pain/Anxiety/Delirium protocol (if indicated): fentanyl, propofol  VAP protocol (if indicated): n/a DVT prophylaxis: SCD- no anticoagulation with GI bleed GI prophylaxis: PPI drip Glucose control: cbg q 4 Mobility: Bedrest Code Status: Full Family Communication: no family at bedside 2/20. Disposition: She remains critically ill due to need for mechanical ventilation, PEEP and FiO2 support.  She is still volume overloaded, and chest x-ray continues to demonstrate diffuse pulmonary infiltrates most likely ARDS versus transfusion related lung injury.  She seems to have improved, her PEEP, and FiO2 requirements are down.  She is tolerating diuresis requiring only low-dose Neo-Synephrine to do so.  I think the vasopressin requirements are due to sedation.  We will continue to support her, transitioning to Precedex and PRN fentanyl.  Hopefully we can initiate weaning in the next 48 hours or less, however I do think she is at high risk for tracheostomy  Erick Colace ACNP-BC [place] Pager # [phone] OR # [phone] if no answer

## 2018-05-27 ENCOUNTER — Inpatient Hospital Stay (HOSPITAL_COMMUNITY): Payer: Medicare Other

## 2018-05-27 LAB — MAGNESIUM
Magnesium: 1.8 mg/dL (ref 1.7–2.4)
Magnesium: 1.7 mg/dL (ref 1.7–2.4)

## 2018-05-27 LAB — PHOSPHORUS
Phosphorus: 1.6 mg/dL — ABNORMAL LOW (ref 2.5–4.6)
Phosphorus: 2.2 mg/dL — ABNORMAL LOW (ref 2.5–4.6)

## 2018-05-27 LAB — COMPREHENSIVE METABOLIC PANEL
ALT: 61 U/L — ABNORMAL HIGH (ref 0–44)
AST: 78 U/L — ABNORMAL HIGH (ref 15–41)
Albumin: 1.4 g/dL — ABNORMAL LOW (ref 3.5–5.0)
Alkaline Phosphatase: 97 U/L (ref 38–126)
Anion gap: 10 (ref 5–15)
BUN: 13 mg/dL (ref 6–20)
CALCIUM: 7.3 mg/dL — AB (ref 8.9–10.3)
CO2: 35 mmol/L — ABNORMAL HIGH (ref 22–32)
Chloride: 100 mmol/L (ref 98–111)
Creatinine, Ser: 0.69 mg/dL (ref 0.44–1.00)
GFR calc non Af Amer: >60 mL/min (ref >60)
Glucose, Bld: 145 mg/dL — ABNORMAL HIGH (ref 70–99)
Potassium: 3.4 mmol/L — ABNORMAL LOW (ref 3.5–5.1)
Sodium: 145 mmol/L (ref 135–145)
Total Bilirubin: 0.2 mg/dL — ABNORMAL LOW (ref 0.3–1.2)
Total Protein: 4.9 g/dL — ABNORMAL LOW (ref 6.5–8.1)

## 2018-05-27 LAB — GLUCOSE, CAPILLARY
Glucose-Capillary: 128 mg/dL — ABNORMAL HIGH (ref 70–99)
Glucose-Capillary: 148 mg/dL — ABNORMAL HIGH (ref 70–99)
Glucose-Capillary: 97 mg/dL (ref 70–99)
Glucose-Capillary: 106 mg/dL — ABNORMAL HIGH (ref 70–99)
Glucose-Capillary: 123 mg/dL — ABNORMAL HIGH (ref 70–99)
Glucose-Capillary: 170 mg/dL — ABNORMAL HIGH (ref 70–99)

## 2018-05-27 MED ORDER — POLYETHYLENE GLYCOL 3350 17 G PO PACK
17.0000 g | PACK | Freq: Every day | ORAL | Status: DC
  Administered 2018-05-27 – 2018-05-28 (×2): 17 g via ORAL
  Filled 2018-05-27 (×2): qty 1

## 2018-05-27 MED ORDER — SODIUM PHOSPHATES 45 MMOLE/15ML IV SOLN
30.0000 mmol | Freq: Once | INTRAVENOUS | Status: AC
  Administered 2018-05-27: 30 mmol via INTRAVENOUS
  Filled 2018-05-27: qty 10

## 2018-05-27 MED ORDER — INSULIN ASPART 100 UNIT/ML ~~LOC~~ SOLN
0.0000 [IU] | SUBCUTANEOUS | Status: DC
  Administered 2018-05-27 – 2018-05-28 (×2): 2 [IU] via SUBCUTANEOUS
  Administered 2018-05-28 – 2018-06-06 (×30): 1 [IU] via SUBCUTANEOUS
  Administered 2018-06-07: 2 [IU] via SUBCUTANEOUS
  Administered 2018-06-07: 1 [IU] via SUBCUTANEOUS

## 2018-05-27 MED ORDER — POTASSIUM CHLORIDE 20 MEQ/15ML (10%) PO SOLN
20.0000 meq | ORAL | Status: AC
  Administered 2018-05-27 (×2): 20 meq
  Filled 2018-05-27 (×2): qty 15

## 2018-05-27 NOTE — Progress Notes (Signed)
NAME:  Christine Ortega, MRN:  [id number], DOB:  [birth year], LOS: 93 ADMISSION DATE:  05/19/2018, CONSULTATION DATE: 2 15 2020 REFERRING MD: GI services, CHIEF COMPLAINT: GI bleed, pneumonia, blood loss anemia shock  Brief History   58 year old with UGI bleed intubated for endoscopy s/p IR embolization of gastroduodenal artery 2/16 for large bleeding gastric ulcer. She had bilateral infiltrates and 3 L oxygen requirement on admission, presumptively diagnosed as community-acquired pneumonia. Intubated   Past Medical History  Attention deficit disorder, Schizophrenia, Bipolar, History of chronic opioid abuse History of tobacco abuse, Overuse of NSAIDs  Significant Hospital Events   05/19/2018 due to excessive GI bleeding transferred to intensive care unit with plan for endoscopy. 2/15 - Flu PCR - neg 2/16 >> change from Precedex to propofol; extubated. 2/18 increasing FIO2 needs. Failed BIPAP; reintubated.  2/19 still hypoxic. CXR w/ diffuse airspace disease. PPlats 38 so titrated down to 6 cc/kg PBW. CT obtained. Neg for PE. Was started on NMB.  2/20: off NMB. Still using high RASS goal of -3 to -4. FIO2 and peep down. Still on low dose pressors. Getting lasix.  2/21: FT placed. Diuresing  2/22 cont diuresis. Transition to precedex.  Consults:  05/19/2018 pulmonary critical care 05/19/2018 GI and IR  Procedures:  2/15 ETT >> 2/16 2/15 R femoral cordis >>2/18 -pt pulled out 2/15 left femoral aline >> 2/16 > embolization of GDA  Significant Diagnostic Tests:  05/19/2018 endoscopy>>  - Normal esophagus. - Red blood in the entire stomach with significant clot burden - as above time not taken to clear the entire stomach due to obvious pathology at the antrum. It is possible there is pathology in the fundus / proximal stomach to cause bleeding but would not have been visualized on this exam. - Giant gastric ulcer at the antrum entering duodenal bulb - not amenable to endoscopic therapy  given appearance and poor ability to visualize the area (of note, hemospray currently not available due to FDA recall). The procedure was aborted due to tenous status with need for more definitive therapy - Blood in the entire examined duodenum. This patient is having life threatening bleeding presumed due to giant gastric ulcer which did not appear amenable to endoscopic therapy. IR was called for attempt at embolization. 2/16 - Post mesenteric arteriogram and empiric embolization of the GDA.   2/18 reintubated. For on-going resp failure.  2/19: Chest x-ray fairly stable.  Increasing PEEP and FiO2 requirements out of proportion to chest x-ray changes.  CT chest ordered to rule out pulmonary emboli > negative. 2/23 weaning on 12/5 PSV  Micro Data:  05/19/2018 blood cultures x2>> ng 05/19/2018 urine culture >>  >100k e.Coli >> sens to CTX 05/19/2018 sputum culture >> nl resp flora 2/15 RVP - neg  Antimicrobials:  05/19/2018 ceftriaxone >> 2/23 05/19/2018 Zithromax >> 2/17  Interim history/subjective:  Fever, WBC down diuresing Hypokalemia Weaning on pressure support vent  Objective   Blood pressure 134/81, pulse 67, temperature (!) 101.3 F (38.5 C), resp. rate 16, height 5\' 3"  (1.6 m), weight 66.2 kg, SpO2 100 %.    Vent Mode: CPAP;PSV FiO2 (%):  [40 %] 40 % Set Rate:  [24 bmp] 24 bmp Vt Set:  [320 mL] 320 mL PEEP:  [5 cmH20] 5 cmH20 Pressure Support:  [12 cmH20-15 cmH20] 12 cmH20 Plateau Pressure:  [18 cmH20-21 cmH20] 18 cmH20   Intake/Output Summary (Last 24 hours) at 05/27/2018 0744 Last data filed at 05/27/2018 0647 Gross per 24 hour  Intake  1842.82 ml  Output 2534 ml  Net -691.18 ml   Filed Weights   05/21/18 0308 05/26/18 0500 05/27/18 0500  Weight: 67.2 kg 68.3 kg 66.2 kg   General:  In bed on vent HENT: NCAT ETT in place PULM: CTA B, vent supported breathing CV: RRR, no mgr GI: BS+, soft, nontender MSK: normal bulk and tone Neuro: sedated on  vent   Resolved Hospital Problem list   Hemorrhagic shock 2/2 UGIB: s/p 7 units PRBC, Hgb 4.1 >> 11 >> now stable 8.5  Assessment & Plan:   ARDS Plan Stop antibiotics Wean pressure support as able VAP prevention Daily WUA/PSV  Acute severe UGIB w/ profound acute blood loss anemia  - found to have bleeding gastric ulcer 2/2 NSAID use s/p IR embolization of GDA  -GI following -h pylori was equivocal  -Hemoglobin has been stable for over 72 hours Plan Continue PPI bid SCD for dvt prevention Monitor for bleeding Transfuse PRBC for Hgb < 7 gm/dL  Sedation related hypotension Plan Titrate phenylephrine for MAP > 65  History attention deficit disorder, bipolar, depression, schizophrenia chronic pain Acute Encephalopathy/ Delirium  Plan Continue depakote Continue precedex infusion with prn fentanyl  Hypokalemia Plan Monitor BMET and UOP Replace electrolytes as needed   QTc prolongation Plan Avoid QTc prolonging drugs  Ecoli UTI Fever 2/23, WBC re-assuring, could be ARDS related, not septic, overall condition improving daily so doubt infection Plan Stop ceftriaxone Monitor off of antibiotics Culture if WBC changes, hemodynamics change  Demand ischemia Plan Tele Supportive care   Foot drop Plan Prevalon boot  Best practice:  Diet: Tube feeding Pain/Anxiety/Delirium protocol (if indicated): precedex, fentanyl PAD protocol VAP protocol (if indicated): yes DVT prophylaxis: SCD- no anticoagulation with GI bleed GI prophylaxis: PPI bid Glucose control: cbg q 4 Mobility: Bedrest Code Status: Full Family Communication: no family at bedside 2/23. Disposition: ICU  My cc time 31 minutes  Roselie Awkward, MD [facility] Pager: [phone] Cell: [phone] If no response, call [phone]

## 2018-05-27 NOTE — Progress Notes (Signed)
eLink Physician-Brief Progress Note Patient Name: MILLI WOOLRIDGE DOB: March 16, 1961 MRN: [id number]   Date of Service  05/27/2018  HPI/Events of Note  Hypertension - Blood glucose = 170.   eICU Interventions  Will order: 1. Q 4 hour sensitive Novolog SSI.     Intervention Category Major Interventions: Hyperglycemia - active titration of insulin therapy  Lysle Dingwall 05/27/2018, 11:38 PM

## 2018-05-28 ENCOUNTER — Inpatient Hospital Stay (HOSPITAL_COMMUNITY): Payer: Medicare Other

## 2018-05-28 DIAGNOSIS — Z9911 Dependence on respirator [ventilator] status: Secondary | ICD-10-CM

## 2018-05-28 DIAGNOSIS — Z452 Encounter for adjustment and management of vascular access device: Secondary | ICD-10-CM

## 2018-05-28 DIAGNOSIS — D500 Iron deficiency anemia secondary to blood loss (chronic): Secondary | ICD-10-CM

## 2018-05-28 DIAGNOSIS — R6521 Severe sepsis with septic shock: Secondary | ICD-10-CM

## 2018-05-28 DIAGNOSIS — A419 Sepsis, unspecified organism: Principal | ICD-10-CM

## 2018-05-28 DIAGNOSIS — Z978 Presence of other specified devices: Secondary | ICD-10-CM

## 2018-05-28 LAB — BASIC METABOLIC PANEL
Anion gap: 6 (ref 5–15)
Anion gap: 8 (ref 5–15)
BUN: 19 mg/dL (ref 6–20)
BUN: 19 mg/dL (ref 6–20)
CALCIUM: 7.5 mg/dL — AB (ref 8.9–10.3)
CO2: 33 mmol/L — ABNORMAL HIGH (ref 22–32)
CO2: 36 mmol/L — AB (ref 22–32)
Calcium: 6.8 mg/dL — ABNORMAL LOW (ref 8.9–10.3)
Chloride: 106 mmol/L (ref 98–111)
Chloride: 99 mmol/L (ref 98–111)
Creatinine, Ser: 0.52 mg/dL (ref 0.44–1.00)
Creatinine, Ser: 0.52 mg/dL (ref 0.44–1.00)
GFR calc Af Amer: >60 mL/min (ref >60)
GFR calc Af Amer: >60 mL/min (ref >60)
GFR calc non Af Amer: >60 mL/min (ref >60)
GFR calc non Af Amer: >60 mL/min (ref >60)
Glucose, Bld: 134 mg/dL — ABNORMAL HIGH (ref 70–99)
Glucose, Bld: 145 mg/dL — ABNORMAL HIGH (ref 70–99)
Potassium: 3.2 mmol/L — ABNORMAL LOW (ref 3.5–5.1)
Potassium: 3.7 mmol/L (ref 3.5–5.1)
Sodium: 145 mmol/L (ref 135–145)
Sodium: 143 mmol/L (ref 135–145)

## 2018-05-28 LAB — CBC
HCT: 30.7 % — ABNORMAL LOW (ref 36.0–46.0)
Hemoglobin: 9 g/dL — ABNORMAL LOW (ref 12.0–15.0)
MCH: 28.8 pg (ref 26.0–34.0)
MCHC: 29.3 g/dL — ABNORMAL LOW (ref 30.0–36.0)
MCV: 98.4 fL (ref 80.0–100.0)
Platelets: 196 10*3/uL (ref 150–400)
RBC: 3.12 MIL/uL — AB (ref 3.87–5.11)
RDW: 17.2 % — ABNORMAL HIGH (ref 11.5–15.5)
WBC: 14.4 10*3/uL — ABNORMAL HIGH (ref 4.0–10.5)
nRBC: 0 % (ref 0.0–0.2)

## 2018-05-28 LAB — PHOSPHORUS
PHOSPHORUS: 2.4 mg/dL — AB (ref 2.5–4.6)
Phosphorus: 1.9 mg/dL — ABNORMAL LOW (ref 2.5–4.6)

## 2018-05-28 LAB — GLUCOSE, CAPILLARY
GLUCOSE-CAPILLARY: 165 mg/dL — AB (ref 70–99)
GLUCOSE-CAPILLARY: 133 mg/dL — AB (ref 70–99)
Glucose-Capillary: 126 mg/dL — ABNORMAL HIGH (ref 70–99)
Glucose-Capillary: 134 mg/dL — ABNORMAL HIGH (ref 70–99)
Glucose-Capillary: 135 mg/dL — ABNORMAL HIGH (ref 70–99)
Glucose-Capillary: 138 mg/dL — ABNORMAL HIGH (ref 70–99)

## 2018-05-28 LAB — TRIGLYCERIDES
Triglycerides: 113 mg/dL (ref <150)
Triglycerides: 122 mg/dL (ref <150)

## 2018-05-28 LAB — MAGNESIUM
Magnesium: 1.7 mg/dL (ref 1.7–2.4)
Magnesium: 2 mg/dL (ref 1.7–2.4)

## 2018-05-28 MED ORDER — SODIUM PHOSPHATES 45 MMOLE/15ML IV SOLN
20.0000 mmol | Freq: Once | INTRAVENOUS | Status: AC
  Administered 2018-05-28: 20 mmol via INTRAVENOUS
  Filled 2018-05-28: qty 6.67

## 2018-05-28 MED ORDER — MAGNESIUM SULFATE 2 GM/50ML IV SOLN
2.0000 g | Freq: Once | INTRAVENOUS | Status: AC
  Administered 2018-05-28: 2 g via INTRAVENOUS
  Filled 2018-05-28: qty 50

## 2018-05-28 MED ORDER — FUROSEMIDE 10 MG/ML IJ SOLN
40.0000 mg | Freq: Three times a day (TID) | INTRAMUSCULAR | Status: AC
  Administered 2018-05-28 – 2018-05-30 (×6): 40 mg via INTRAVENOUS
  Filled 2018-05-28 (×6): qty 4

## 2018-05-28 MED ORDER — DIVALPROEX SODIUM ER 500 MG PO TB24
500.0000 mg | ORAL_TABLET | Freq: Every day | ORAL | Status: DC
  Filled 2018-05-28: qty 1

## 2018-05-28 MED ORDER — POTASSIUM CHLORIDE 20 MEQ/15ML (10%) PO SOLN
30.0000 meq | ORAL | Status: AC
  Administered 2018-05-28 (×2): 30 meq
  Filled 2018-05-28 (×2): qty 30

## 2018-05-28 NOTE — Progress Notes (Signed)
..  [facility] ADULT ICU REPLACEMENT PROTOCOL FOR AM LAB REPLACEMENT ONLY  The patient does apply for the [facility] Adult ICU Electrolyte Replacment Protocol based on the criteria listed below:   1. Is GFR >/= 40 ml/min? Yes.    Patient's GFR today is >60 2. Is urine output >/= 0.5 ml/kg/hr for the last 6 hours? Yes.   Patient's UOP is 0.75 ml/kg/hr 3. Is BUN < 60 mg/dL? Yes.    Patient's BUN today is 19 4. Abnormal electrolyte(s): K3.2 Phos 1.9 5. Ordered repletion with: protocol 6. If a panic level lab has been reported, has the CCM MD in charge been notified? Yes.  .   Physician:  Dr.Sommer  Carlisle Beers 05/28/2018 5:20 AM

## 2018-05-28 NOTE — Progress Notes (Signed)
NAME:  Christine Ortega, MRN:  [id number], DOB:  07-01-1960, LOS: 52 ADMISSION DATE:  05/19/2018, CONSULTATION DATE: 2 15 2020 REFERRING MD: GI services, CHIEF COMPLAINT: GI bleed, pneumonia, blood loss anemia shock  Brief History   58 year old with UGI bleed intubated for endoscopy s/p IR embolization of gastroduodenal artery 2/16 for large bleeding gastric ulcer. She had bilateral infiltrates and 3 L oxygen requirement on admission, presumptively diagnosed as community-acquired pneumonia. Intubated   Past Medical History  Attention deficit disorder, Schizophrenia, Bipolar, History of chronic opioid abuse, History of tobacco abuse, Overuse of NSAIDs  Significant Hospital Events   05/19/2018 due to excessive GI bleeding transferred to intensive care unit with plan for endoscopy. 2/15 - Flu PCR - neg 2/16 >> change from Precedex to propofol; extubated. 2/18 increasing FIO2 needs. Failed BIPAP; reintubated.  2/19 still hypoxic. CXR w/ diffuse airspace disease. PPlats 38 so titrated down to 6 cc/kg PBW. CT obtained. Neg for PE. Was started on NMB.  2/20: off NMB. Still using high RASS goal of -3 to -4. FIO2 and peep down. Still on low dose pressors. Getting lasix.  2/21: FT placed. Diuresing  2/22 cont diuresis. Transition to precedex.  2/23 weaning on 12/5 PSV  Consults:  05/19/2018 pulmonary critical care 05/19/2018 GI and IR  Procedures:  2/15 ETT >> 2/16 2/15 R femoral cordis >>2/18 -pt pulled out 2/15 left femoral aline >> 2/16 > embolization of GDA  Significant Diagnostic Tests:  05/19/2018 endoscopy>>  - Normal esophagus. - Red blood in the entire stomach with significant clot burden - as above time not taken to clear the entire stomach due to obvious pathology at the antrum. It is possible there is pathology in the fundus / proximal stomach to cause bleeding but would not have been visualized on this exam. - Giant gastric ulcer at the antrum entering duodenal bulb - not  amenable to endoscopic therapy given appearance and poor ability to visualize the area (of note, hemospray currently not available due to FDA recall). The procedure was aborted due to tenous status with need for more definitive therapy - Blood in the entire examined duodenum. This patient is having life threatening bleeding presumed due to giant gastric ulcer which did not appear amenable to endoscopic therapy. IR was called for attempt at embolization. 2/16 - Post mesenteric arteriogram and empiric embolization of the GDA.   2/18 reintubated. For on-going resp failure.  2/19: Chest x-ray fairly stable.  Increasing PEEP and FiO2 requirements out of proportion to chest x-ray changes.  CT chest ordered to rule out pulmonary emboli > negative.  Micro Data:  05/19/2018 blood cultures x2>> ng 05/19/2018 urine culture >>  >100k e.Coli >> sens to CTX 05/19/2018 sputum culture >> nl resp flora 2/15 RVP - neg  Antimicrobials:  05/19/2018 ceftriaxone >> 2/23 05/19/2018 Zithromax >> 2/17  Interim history/subjective:  Currently on 10/5 PSV  Objective   Blood pressure (!) 103/55, pulse 85, temperature 99.9 F (37.7 C), resp. rate 15, height 5\' 3"  (1.6 m), weight 69.8 kg, SpO2 100 %.    Vent Mode: PRVC FiO2 (%):  [40 %] 40 % Set Rate:  [24 bmp] 24 bmp Vt Set:  [320 mL] 320 mL PEEP:  [5 cmH20] 5 cmH20 Pressure Support:  [12 cmH20] 12 cmH20 Plateau Pressure:  [14 cmH20-18 cmH20] 18 cmH20   Intake/Output Summary (Last 24 hours) at 05/28/2018 0740 Last data filed at 05/28/2018 0600 Gross per 24 hour  Intake 2308.33 ml  Output 1150 ml  Net 1158.33 ml   Filed Weights   05/26/18 0500 05/27/18 0500 05/28/18 0500  Weight: 68.3 kg 66.2 kg 69.8 kg   General:  Sedated on vent HENT: ETT in place PULM: slightly rhonchorous throughout CV: RRR, no mrg GI: soft, hypoactive BS, non distended MSK: normal bulk and tone, edematous UE Neuro: sedated on vent  Resolved Hospital Problem list   Hemorrhagic  shock 2/2 UGIB: s/p 7 units PRBC, Hgb 4.1 >> 11 >> now stable 8.5  Assessment & Plan:   ARDS Plan CXR this am slightly improved this am.  s/p antibiotics Wean pressure support as able VAP prevention Daily WUA/PSV  Acute severe UGIB w/ profound acute blood loss anemia  - found to have bleeding gastric ulcer 2/2 NSAID use s/p IR embolization of GDA  -GI following -h pylori was equivocal  -Hemoglobin has been stable for over 72 hours Plan Continue PPI bid SCD for dvt prevention Monitor for bleeding Transfuse PRBC for Hgb < 7 gm/dL  Sedation related hypotension Plan Now off neo  History attention deficit disorder, bipolar, depression, schizophrenia chronic pain Acute Encephalopathy/ Delirium  Plan Continue depakote Continue precedex infusion with prn fentanyl  Hypokalemia Plan Monitor BMET and UOP Replace electrolytes as needed  QTc prolongation Plan Avoid QTc prolonging drugs  Ecoli UTI Fever overnight, WBC trended back up s/p abx. Could be infectious though not septic and overall condition improving. CXR improving.  Plan S/p CTX Consider reculture.  Demand ischemia Plan Tele Supportive care   Foot drop Plan Prevalon boot  Constipation Plan Last documented stool 2/17. Continue bowel regimen.  Best practice:  Diet: Tube feeding Pain/Anxiety/Delirium protocol (if indicated): precedex, fentanyl PAD protocol VAP protocol (if indicated): yes DVT prophylaxis: SCD- no anticoagulation with GI bleed GI prophylaxis: PPI bid Glucose control: cbg q 4 Mobility: Bedrest Code Status: Full Family Communication: no family at bedside 2/24 Disposition: ICU  Rory Percy, DO PGY-2, [facility] 05/28/2018 7:46 AM

## 2018-05-29 ENCOUNTER — Inpatient Hospital Stay (HOSPITAL_COMMUNITY): Payer: Medicare Other

## 2018-05-29 DIAGNOSIS — R579 Shock, unspecified: Secondary | ICD-10-CM

## 2018-05-29 LAB — CBC
HCT: 32.6 % — ABNORMAL LOW (ref 36.0–46.0)
Hemoglobin: 9.3 g/dL — ABNORMAL LOW (ref 12.0–15.0)
MCH: 28.3 pg (ref 26.0–34.0)
MCHC: 28.5 g/dL — ABNORMAL LOW (ref 30.0–36.0)
MCV: 99.1 fL (ref 80.0–100.0)
Platelets: 274 10*3/uL (ref 150–400)
RBC: 3.29 MIL/uL — ABNORMAL LOW (ref 3.87–5.11)
RDW: 17 % — ABNORMAL HIGH (ref 11.5–15.5)
WBC: 17.6 10*3/uL — ABNORMAL HIGH (ref 4.0–10.5)
nRBC: 0 % (ref 0.0–0.2)

## 2018-05-29 LAB — GLUCOSE, CAPILLARY
Glucose-Capillary: 128 mg/dL — ABNORMAL HIGH (ref 70–99)
Glucose-Capillary: 144 mg/dL — ABNORMAL HIGH (ref 70–99)
Glucose-Capillary: 141 mg/dL — ABNORMAL HIGH (ref 70–99)
Glucose-Capillary: 143 mg/dL — ABNORMAL HIGH (ref 70–99)
Glucose-Capillary: 127 mg/dL — ABNORMAL HIGH (ref 70–99)

## 2018-05-29 LAB — BASIC METABOLIC PANEL
Anion gap: 8 (ref 5–15)
Anion gap: 10 (ref 5–15)
BUN: 20 mg/dL (ref 6–20)
BUN: 21 mg/dL — ABNORMAL HIGH (ref 6–20)
CO2: 38 mmol/L — ABNORMAL HIGH (ref 22–32)
CO2: 40 mmol/L — ABNORMAL HIGH (ref 22–32)
Calcium: 7.9 mg/dL — ABNORMAL LOW (ref 8.9–10.3)
Calcium: 8.1 mg/dL — ABNORMAL LOW (ref 8.9–10.3)
Chloride: 98 mmol/L (ref 98–111)
Chloride: 95 mmol/L — ABNORMAL LOW (ref 98–111)
Creatinine, Ser: 0.54 mg/dL (ref 0.44–1.00)
Creatinine, Ser: 0.57 mg/dL (ref 0.44–1.00)
GFR calc Af Amer: >60 mL/min (ref >60)
GFR calc non Af Amer: >60 mL/min (ref >60)
Glucose, Bld: 148 mg/dL — ABNORMAL HIGH (ref 70–99)
Glucose, Bld: 145 mg/dL — ABNORMAL HIGH (ref 70–99)
Potassium: 3.6 mmol/L (ref 3.5–5.1)
Potassium: 3.5 mmol/L (ref 3.5–5.1)
Sodium: 144 mmol/L (ref 135–145)
Sodium: 145 mmol/L (ref 135–145)

## 2018-05-29 LAB — URINE CULTURE: Culture: NO GROWTH

## 2018-05-29 LAB — PHOSPHORUS
PHOSPHORUS: 2.7 mg/dL (ref 2.5–4.6)
Phosphorus: 2.5 mg/dL (ref 2.5–4.6)

## 2018-05-29 LAB — MAGNESIUM
Magnesium: 1.9 mg/dL (ref 1.7–2.4)
Magnesium: 1.7 mg/dL (ref 1.7–2.4)

## 2018-05-29 MED ORDER — WHITE PETROLATUM EX OINT
TOPICAL_OINTMENT | CUTANEOUS | Status: AC
  Administered 2018-05-29: 0.2
  Filled 2018-05-29: qty 28.35

## 2018-05-29 MED ORDER — PANTOPRAZOLE SODIUM 40 MG IV SOLR
40.0000 mg | Freq: Two times a day (BID) | INTRAVENOUS | Status: DC
  Administered 2018-05-29: 40 mg via INTRAVENOUS
  Filled 2018-05-29: qty 40

## 2018-05-29 MED ORDER — PANTOPRAZOLE SODIUM 40 MG PO PACK: 40.0000 mg | PACK | Freq: Two times a day (BID) | ORAL | Status: DC

## 2018-05-29 MED ORDER — SODIUM CHLORIDE 0.9 % IV SOLN
1.0000 g | INTRAVENOUS | Status: DC
  Administered 2018-05-29 – 2018-06-01 (×4): 1 g via INTRAVENOUS
  Filled 2018-05-29 (×4): qty 10

## 2018-05-29 MED ORDER — POLYETHYLENE GLYCOL 3350 17 G PO PACK: 17.0000 g | PACK | Freq: Every day | ORAL | Status: DC | PRN

## 2018-05-29 MED ORDER — VALPROIC ACID 250 MG/5ML PO SOLN
250.0000 mg | Freq: Two times a day (BID) | ORAL | Status: DC
  Administered 2018-05-29 – 2018-05-31 (×4): 250 mg
  Filled 2018-05-29 (×4): qty 5

## 2018-05-29 MED ORDER — FENTANYL CITRATE (PF) 100 MCG/2ML IJ SOLN
50.0000 ug | INTRAMUSCULAR | Status: DC | PRN
  Administered 2018-05-29 – 2018-06-04 (×15): 50 ug via INTRAVENOUS
  Filled 2018-05-29 (×16): qty 2

## 2018-05-29 NOTE — Progress Notes (Signed)
NAME:  Christine Ortega, MRN:  [id number], DOB:  [birth year], LOS: 39 ADMISSION DATE:  05/19/2018, CONSULTATION DATE: 2 15 2020 REFERRING MD: GI services, CHIEF COMPLAINT: GI bleed, pneumonia, blood loss anemia shock  Brief History   58 year old with UGI bleed intubated for endoscopy s/p IR embolization of gastroduodenal artery 2/16 for large bleeding gastric ulcer. She had bilateral infiltrates and 3 L oxygen requirement on admission, presumptively diagnosed as community-acquired pneumonia. Intubated. Ecoli UTI.   Past Medical History  Attention deficit disorder, Schizophrenia, Bipolar, History of chronic opioid abuse, History of tobacco abuse, Overuse of NSAIDs  Significant Hospital Events   05/19/2018 due to excessive GI bleeding transferred to intensive care unit with plan for endoscopy. 2/15 - Flu PCR - neg 2/16 >> change from Precedex to propofol; extubated. 2/18 increasing FIO2 needs. Failed BIPAP; reintubated.  2/19 still hypoxic. CXR w/ diffuse airspace disease. PPlats 38 so titrated down to 6 cc/kg PBW. CT obtained. Neg for PE. Was started on NMB.  2/20: off NMB. Still using high RASS goal of -3 to -4. FIO2 and peep down. Still on low dose pressors. Getting lasix.  2/21: FT placed. Diuresing  2/22 cont diuresis. Transition to precedex.  2/23 weaning on 12/5 PSV  Consults:  05/19/2018 pulmonary critical care 05/19/2018 GI and IR  Procedures:  2/15 ETT >> 2/16 2/15 R femoral cordis >>2/18 -pt pulled out 2/15 left femoral aline >> 2/16 > embolization of GDA  Significant Diagnostic Tests:  05/19/2018 endoscopy>>  - Normal esophagus. - Red blood in the entire stomach with significant clot burden - as above time not taken to clear the entire stomach due to obvious pathology at the antrum. It is possible there is pathology in the fundus / proximal stomach to cause bleeding but would not have been visualized on this exam. - Giant gastric ulcer at the antrum entering duodenal bulb -  not amenable to endoscopic therapy given appearance and poor ability to visualize the area (of note, hemospray currently not available due to FDA recall). The procedure was aborted due to tenous status with need for more definitive therapy - Blood in the entire examined duodenum. This patient is having life threatening bleeding presumed due to giant gastric ulcer which did not appear amenable to endoscopic therapy. IR was called for attempt at embolization. 2/16 - Post mesenteric arteriogram and empiric embolization of the GDA.   2/18 reintubated. For on-going resp failure.  2/19: Chest x-ray fairly stable.  Increasing PEEP and FiO2 requirements out of proportion to chest x-ray changes.  CT chest ordered to rule out pulmonary emboli > negative.  Micro Data:  05/19/2018 blood cultures x2>> ng 05/19/2018 urine culture >>  >100k e.Coli >> sens to CTX 05/19/2018 sputum culture >> nl resp flora 2/15 RVP - neg 2/24 Blood Cx >> 2/24 Urine Cx >>  Antimicrobials:  ceftriaxone 2/15 >> 2/23, 2/25 > Zithromax 2/15 >> 2/17  Interim history/subjective:  Febrile and tachycardic overnight.  Objective   Blood pressure (!) 153/90, pulse (!) 117, temperature (!) 100.9 F (38.3 C), temperature source Axillary, resp. rate (!) 24, height 5\' 3"  (1.6 m), weight 65.2 kg, SpO2 100 %.    Vent Mode: PRVC FiO2 (%):  [40 %] 40 % Set Rate:  [24 bmp] 24 bmp Vt Set:  [320 mL] 320 mL PEEP:  [5 cmH20] 5 cmH20 Pressure Support:  [10 cmH20] 10 cmH20 Plateau Pressure:  [20 cmH20-22 cmH20] 21 cmH20   Intake/Output Summary (Last 24 hours) at 05/29/2018 0756  Last data filed at 05/29/2018 0700 Gross per 24 hour  Intake 1904.79 ml  Output 3820 ml  Net -1915.21 ml   Filed Weights   05/27/18 0500 05/28/18 0500 05/29/18 0500  Weight: 66.2 kg 69.8 kg 65.2 kg   General:  On vent, opens eyes intermittently HENT: ETT in place PULM: rhonchorous throughout CV: tachycardic, regular rhythm GI: soft, +BS, non  distended MSK: normal bulk and tone, improved edema to UE Neuro: opens eyes but no purposeful movement, not responsive  Resolved Hospital Problem list   Hemorrhagic shock 2/2 UGIB: s/p 7 units PRBC, Hgb 4.1 >> 11 >> now stable 8.5 Sedation related hypotension  Assessment & Plan:   ARDS Good UOP with BID Lasix, net +4L. Continues to wean, currently 10/5. Plan Wean pressure support as able VAP prevention Daily WUA/PSV Continue BID Lasix  Acute severe UGIB w/ profound acute blood loss anemia  - found to have bleeding gastric ulcer 2/2 NSAID use s/p IR embolization of GDA  -GI following -h pylori was equivocal  -Hemoglobin has been stable for over 72 hours Plan Continue PPI bid SCD for dvt prevention Monitor for bleeding Transfuse PRBC for Hgb < 7 gm/dL  History attention deficit disorder, bipolar, depression, schizophrenia chronic pain Acute Encephalopathy/ Delirium  Plan Continue depakote per tube Continue prn fentanyl  Hypokalemia Plan Monitor BMET and UOP Replace electrolytes as needed  QTc prolongation Plan Avoid QTc prolonging drugs  Ecoli UTI Febrile overnight, tachycardic. Foley changed and repeat cultures sent yesterday. Will restart abx. Plan Start CTX Await cultures  Demand ischemia Plan Tele Supportive care   Foot drop Plan Prevalon boot  Constipation Plan Stool noted yesterday. Continue bowel regimen.  Best practice:  Diet: Tube feeding Pain/Anxiety/Delirium protocol (if indicated): fentanyl PAD protocol VAP protocol (if indicated): yes DVT prophylaxis: SCD- no anticoagulation with GI bleed GI prophylaxis: PPI bid Glucose control: cbg q 4 Mobility: Bedrest Code Status: Full Family Communication: no family at bedside 2/25, will call today with updates Disposition: ICU  Rory Percy, DO PGY-2, [facility] 05/29/2018 7:56 AM

## 2018-05-29 NOTE — Progress Notes (Signed)
Daughter called last night. Was upset about lack of communication from the doctor "for the last 5 days". Please call today.

## 2018-05-30 ENCOUNTER — Inpatient Hospital Stay (HOSPITAL_COMMUNITY): Payer: Medicare Other

## 2018-05-30 LAB — CBC WITH DIFFERENTIAL/PLATELET
Abs Immature Granulocytes: 0.11 10*3/uL — ABNORMAL HIGH (ref 0.00–0.07)
Basophils Absolute: 0.1 10*3/uL (ref 0.0–0.1)
Basophils Relative: 1 %
Eosinophils Absolute: 0.5 10*3/uL (ref 0.0–0.5)
Eosinophils Relative: 3 %
HCT: 32.1 % — ABNORMAL LOW (ref 36.0–46.0)
Hemoglobin: 9.3 g/dL — ABNORMAL LOW (ref 12.0–15.0)
Immature Granulocytes: 1 %
LYMPHS PCT: 18 %
Lymphs Abs: 2.7 10*3/uL (ref 0.7–4.0)
MCH: 28.8 pg (ref 26.0–34.0)
MCHC: 29 g/dL — ABNORMAL LOW (ref 30.0–36.0)
MCV: 99.4 fL (ref 80.0–100.0)
Monocytes Absolute: 1.9 10*3/uL — ABNORMAL HIGH (ref 0.1–1.0)
Monocytes Relative: 13 %
Neutro Abs: 9.7 10*3/uL — ABNORMAL HIGH (ref 1.7–7.7)
Neutrophils Relative %: 64 %
Platelets: 340 10*3/uL (ref 150–400)
RBC: 3.23 MIL/uL — ABNORMAL LOW (ref 3.87–5.11)
RDW: 17.1 % — ABNORMAL HIGH (ref 11.5–15.5)
WBC: 14.9 10*3/uL — AB (ref 4.0–10.5)
nRBC: 0 % (ref 0.0–0.2)

## 2018-05-30 LAB — BASIC METABOLIC PANEL
ANION GAP: 8 (ref 5–15)
Anion gap: 10 (ref 5–15)
BUN: 23 mg/dL — ABNORMAL HIGH (ref 6–20)
BUN: 25 mg/dL — ABNORMAL HIGH (ref 6–20)
CHLORIDE: 94 mmol/L — AB (ref 98–111)
CO2: 40 mmol/L — ABNORMAL HIGH (ref 22–32)
CO2: 37 mmol/L — ABNORMAL HIGH (ref 22–32)
Calcium: 8.1 mg/dL — ABNORMAL LOW (ref 8.9–10.3)
Calcium: 8.3 mg/dL — ABNORMAL LOW (ref 8.9–10.3)
Chloride: 97 mmol/L — ABNORMAL LOW (ref 98–111)
Creatinine, Ser: 0.55 mg/dL (ref 0.44–1.00)
Creatinine, Ser: 0.62 mg/dL (ref 0.44–1.00)
GFR calc Af Amer: >60 mL/min (ref >60)
GFR calc Af Amer: >60 mL/min (ref >60)
GFR calc non Af Amer: >60 mL/min (ref >60)
Glucose, Bld: 111 mg/dL — ABNORMAL HIGH (ref 70–99)
Glucose, Bld: 130 mg/dL — ABNORMAL HIGH (ref 70–99)
Potassium: 3.2 mmol/L — ABNORMAL LOW (ref 3.5–5.1)
Potassium: 3.8 mmol/L (ref 3.5–5.1)
Sodium: 144 mmol/L (ref 135–145)
Sodium: 142 mmol/L (ref 135–145)

## 2018-05-30 LAB — GLUCOSE, CAPILLARY
Glucose-Capillary: 115 mg/dL — ABNORMAL HIGH (ref 70–99)
Glucose-Capillary: 119 mg/dL — ABNORMAL HIGH (ref 70–99)
Glucose-Capillary: 119 mg/dL — ABNORMAL HIGH (ref 70–99)
Glucose-Capillary: 123 mg/dL — ABNORMAL HIGH (ref 70–99)
Glucose-Capillary: 123 mg/dL — ABNORMAL HIGH (ref 70–99)
Glucose-Capillary: 125 mg/dL — ABNORMAL HIGH (ref 70–99)
Glucose-Capillary: 129 mg/dL — ABNORMAL HIGH (ref 70–99)

## 2018-05-30 LAB — PHOSPHORUS
Phosphorus: 2.5 mg/dL (ref 2.5–4.6)
Phosphorus: 2.7 mg/dL (ref 2.5–4.6)

## 2018-05-30 LAB — MAGNESIUM
MAGNESIUM: 1.8 mg/dL (ref 1.7–2.4)
Magnesium: 1.7 mg/dL (ref 1.7–2.4)

## 2018-05-30 MED ORDER — POTASSIUM CHLORIDE 20 MEQ/15ML (10%) PO SOLN: 40.0000 meq | Freq: Every day | ORAL | Status: DC

## 2018-05-30 MED ORDER — VITAL AF 1.2 CAL PO LIQD
1000.0000 mL | ORAL | Status: DC
  Administered 2018-06-01 – 2018-06-03 (×3): 1000 mL
  Filled 2018-05-30 (×2): qty 1000

## 2018-05-30 MED ORDER — POTASSIUM CHLORIDE 20 MEQ/15ML (10%) PO SOLN
40.0000 meq | Freq: Every day | ORAL | Status: DC
  Administered 2018-05-30 – 2018-06-04 (×6): 40 meq
  Filled 2018-05-30 (×6): qty 30

## 2018-05-30 MED ORDER — PANTOPRAZOLE SODIUM 40 MG PO PACK
40.0000 mg | PACK | Freq: Two times a day (BID) | ORAL | Status: DC
  Administered 2018-05-30 – 2018-06-05 (×13): 40 mg
  Filled 2018-05-30 (×14): qty 20

## 2018-05-30 MED ORDER — PRO-STAT SUGAR FREE PO LIQD
30.0000 mL | Freq: Every day | ORAL | Status: DC
  Administered 2018-05-30 – 2018-06-05 (×7): 30 mL
  Filled 2018-05-30 (×7): qty 30

## 2018-05-30 MED ORDER — VANCOMYCIN HCL 10 G IV SOLR
1250.0000 mg | INTRAVENOUS | Status: DC
  Administered 2018-05-30 – 2018-05-31 (×2): 1250 mg via INTRAVENOUS
  Filled 2018-05-30 (×6): qty 1250

## 2018-05-30 MED ORDER — FUROSEMIDE 10 MG/ML IJ SOLN
40.0000 mg | Freq: Every day | INTRAMUSCULAR | Status: AC
  Administered 2018-05-31 – 2018-06-02 (×3): 40 mg via INTRAVENOUS
  Filled 2018-05-30 (×3): qty 4

## 2018-05-30 MED ORDER — MAGNESIUM SULFATE 2 GM/50ML IV SOLN
2.0000 g | Freq: Once | INTRAVENOUS | Status: AC
  Administered 2018-05-30: 2 g via INTRAVENOUS
  Filled 2018-05-30: qty 50

## 2018-05-30 MED ORDER — PANTOPRAZOLE SODIUM 40 MG PO PACK: 40.0000 mg | PACK | Freq: Every day | ORAL | Status: DC

## 2018-05-30 NOTE — Progress Notes (Addendum)
Nutrition Follow-up  DOCUMENTATION CODES:   Not applicable  INTERVENTION:  To better meet patients new estimated needs, decrease Vital AF 1.2 to 5m/hr and add 37mProstat. Tube feed regimen will provide 1540 kcal, 105g protein, and 97355mree water.  NUTRITION DIAGNOSIS:   Inadequate oral intake related to inability to eat as evidenced by NPO status.  Ongoing  GOAL:   Provide needs based on ASPEN/SCCM guidelines  Met  MONITOR:   Labs, I & O's, TF tolerance, Skin, Vent status  ASSESSMENT:   [age]ar old female with a PMH of narcotic abuse, hypokalemia, anxiety, and bipolar disorder. [facility] after developing a GI Bleed blood loss anemia. EGD performed and revealed giant gastric ulcer at the antrum entering the duodenal bulb. Was then found to have E.Coli UTI and further complicated by ARDS.  2/17/35bolization of GDA,GIbleeding controlled 2/17 Extubated 2/18 Re-intubated d/t increasing respiratory insufficiency 2/21 Cortrak placed  Unable to extubate today. Per Dr.Icard note, liberation from vent will hopefully happen tomorrow morning.   Per chart, pt has had a 6.4% wt loss in 9 days which is significant for the time frame. However, it was noted she has edema and patient is on Lasix.   Discussed pt with RN, no concerns for regarding tube feeding tolerance. Vital AF 1.2 currently running at goal rate of 86m39m.  MAP (cuff): 80s MV: 7.1 L/min Temp (24hrs), Avg:99.3 F (37.4 C), Min:98.3 F (36.8 C), Max:101 F (38.3 C)  Medications reviewed and include: Lasix 40mg9mly, insulin aspart 0-9 units q 4hr Labs reviewed: K 3.2 (L), BUN 23 (H), CBG (115-129) Net I/O: +861ml 39mt Order:   Diet Order            Diet NPO time specified  Diet effective now              EDUCATION NEEDS:   No education needs have been identified at this time  Skin:  Skin Assessment: Reviewed RN Assessment  Last BM:  2/26  Height:    Ht Readings from Last 1 Encounters:  05/19/18 '5\' 3"'  (1.6 m)    Weight:   Wt Readings from Last 1 Encounters:  05/30/18 62.9 kg    Ideal Body Weight:  52.27 kg  BMI:  Body mass index is 24.56 kg/m.  Estimated Nutritional Needs:   Kcal:  1550 kcal  Protein:  100-115 g  Fluid:  >/= 1.6L    Amber [employer]

## 2018-05-30 NOTE — Progress Notes (Signed)
Dr. Valeta Harms notified of pt desat on PS/CPAP of  5/5 and 10/5 to 84-85%. Pt continues to have copious secretions via ETT and oral. RN notified of pt being placed on FS settings. Will attempt PS/CPAP trial later and again in the am.

## 2018-05-30 NOTE — Progress Notes (Signed)
NAME:  Christine Ortega, MRN:  [id number], DOB:  15-Oct-1960, LOS: 37 ADMISSION DATE:  05/19/2018, CONSULTATION DATE: 2 15 2020 REFERRING MD: GI services, CHIEF COMPLAINT: GI bleed, pneumonia, blood loss anemia shock  Brief History   58 year old with UGI bleed intubated for endoscopy s/p IR embolization of gastroduodenal artery 2/16 for large bleeding gastric ulcer. She had bilateral infiltrates and 3 L oxygen requirement on admission, presumptively diagnosed as community-acquired pneumonia. Intubated. Ecoli UTI.   Past Medical History  Attention deficit disorder, Schizophrenia, Bipolar, History of chronic opioid abuse, History of tobacco abuse, Overuse of NSAIDs  Significant Hospital Events   05/19/2018 due to excessive GI bleeding transferred to intensive care unit with plan for endoscopy. 2/15 - Flu PCR - neg 2/16 >> change from Precedex to propofol; extubated. 2/18 increasing FIO2 needs. Failed BIPAP; reintubated.  2/19 still hypoxic. CXR w/ diffuse airspace disease. PPlats 38 so titrated down to 6 cc/kg PBW. CT obtained. Neg for PE. Was started on NMB.  2/20: off NMB. Still using high RASS goal of -3 to -4. FIO2 and peep down. Still on low dose pressors. Getting lasix.  2/21: FT placed. Diuresing  2/22 cont diuresis. Transition to precedex.  2/23 weaning on 12/5 PSV 2/26 weaning on 10/5 PSV, mental status remains barrier to extubation (is intermittently awake but not following commands)  Consults:  05/19/2018 pulmonary critical care 05/19/2018 GI and IR  Procedures:  2/15 ETT >> 2/16 2/15 R femoral cordis >>2/18 -pt pulled out 2/15 left femoral aline >> 2/16 > embolization of GDA  Significant Diagnostic Tests:  05/19/2018 endoscopy>>  - Normal esophagus. - Red blood in the entire stomach with significant clot burden - as above time not taken to clear the entire stomach due to obvious pathology at the antrum. It is possible there is pathology in the fundus / proximal stomach to  cause bleeding but would not have been visualized on this exam. - Giant gastric ulcer at the antrum entering duodenal bulb - not amenable to endoscopic therapy given appearance and poor ability to visualize the area (of note, hemospray currently not available due to FDA recall). The procedure was aborted due to tenous status with need for more definitive therapy - Blood in the entire examined duodenum. This patient is having life threatening bleeding presumed due to giant gastric ulcer which did not appear amenable to endoscopic therapy. IR was called for attempt at embolization. 2/16 - Post mesenteric arteriogram and empiric embolization of the GDA.   2/18 reintubated. For on-going resp failure.  2/19: Chest x-ray fairly stable.  Increasing PEEP and FiO2 requirements out of proportion to chest x-ray changes.  CT chest ordered to rule out pulmonary emboli > negative.  Micro Data:  05/19/2018 blood cultures x2>> ng 05/19/2018 urine culture >>  >100k e.Coli >> sens to CTX 05/19/2018 sputum culture >> nl resp flora 2/15 RVP - neg 2/24 Blood Cx >> 2/24 Urine Cx >>  Antimicrobials:  ceftriaxone 2/15 >> 2/23, 2/25 > Zithromax 2/15 >> 2/17  Interim history/subjective:  No acute events.  Tolerating SBT 10/5 this AM.  Intermittently wakes up, opens eyes; however, does not follow any commands.  Objective   Blood pressure (!) 143/62, pulse (!) 109, temperature 99 F (37.2 C), temperature source Axillary, resp. rate (!) 24, height 5\' 3"  (1.6 m), weight 62.9 kg, SpO2 100 %.    Vent Mode: CPAP;PSV FiO2 (%):  [40 %] 40 % Set Rate:  [24 bmp] 24 bmp Vt Set:  [696  mL] 320 mL PEEP:  [5 cmH20] 5 cmH20 Pressure Support:  [10 cmH20] 10 cmH20 Plateau Pressure:  [18 cmH20-19 cmH20] 19 cmH20   Intake/Output Summary (Last 24 hours) at 05/30/2018 0737 Last data filed at 05/30/2018 0600 Gross per 24 hour  Intake 1242.85 ml  Output 3811 ml  Net -2568.15 ml   Filed Weights   05/28/18 0500 05/29/18 0500  05/30/18 0500  Weight: 69.8 kg 65.2 kg 62.9 kg   General:  Adult female, On vent, opens eyes intermittently HENT: ETT in place PULM: Faint rhonchi in bases CV: RRR, no M/R/G GI: soft, +BS, non distended MSK: normal bulk and tone, improved edema to UE Neuro: opens eyes but no purposeful movement, not responsive  Resolved Hospital Problem list   Hemorrhagic shock 2/2 UGIB: s/p 7 units PRBC, Hgb 4.1 >> 11 >> now stable 8.5 Sedation related hypotension  Assessment & Plan:   ARDS Good UOP with BID Lasix, net +2L. Continues to wean, currently 10/5. Plan Continue PSV wean Mental status needs to improve before considering extubation VAP prevention Continue Lasix, change to daily dosing vs BID Follow CXR  Acute severe UGIB w/ profound acute blood loss anemia  - found to have bleeding gastric ulcer 2/2 NSAID use s/p IR embolization of GDA  -GI following -h pylori was equivocal  -Hemoglobin has been stable for over 72 hours Plan Continue PPI bid SCD for dvt prevention Monitor for bleeding Transfuse PRBC for Hgb < 7 gm/dL  History attention deficit disorder, bipolar, depression, schizophrenia chronic pain Acute Encephalopathy/ Delirium  Plan Continue depakote per tube Continue prn fentanyl  QTc prolongation Plan Avoid QTc prolonging drugs  Ecoli UTI s/p abx.  Spiked fever with tachycardia overnight 2/25 so abx resumed Plan Continue CTX Follow cultures  Demand ischemia Plan Tele Supportive care   Foot drop Plan Prevalon boot  Constipation Plan Stool noted yesterday. Continue bowel regimen.  Best practice:  Diet: Tube feeding Pain/Anxiety/Delirium protocol (if indicated): fentanyl PAD protocol, limit as able VAP protocol (if indicated): yes DVT prophylaxis: SCD- no anticoagulation with GI bleed GI prophylaxis: PPI bid Glucose control: SSI Mobility: Bedrest Code Status: Full Family Communication: no family at bedside 2/25, 2/26 Disposition: ICU   Montey Hora, [facility] Pager: [phone] - [phone].  If no answer, [phone] 05/30/2018, 7:48 AM

## 2018-05-30 NOTE — Progress Notes (Signed)
Pharmacy Antibiotic Note  Christine Ortega is a 58 y.o. female admitted on 05/19/2018 with upper GI bleed. Now with potential pneumonia. Tracheal aspirate is now growing gram positive cocci.  Pharmacy has been consulted for vancomycin dosing.Scr-0.55. WBC 14.9 and Tmax-101.   Plan: Vancomycin 1250 mg every 24 hours  Predicted AUC 478 with SCr-0.8 Monitor renal function, levels at steady state   Height: 5\' 3"  (160 cm) Weight: 138 lb 10.7 oz (62.9 kg) IBW/kg (Calculated) : 52.4  Temp (24hrs), Avg:99.3 F (37.4 C), Min:98.3 F (36.8 C), Max:101 F (38.3 C)  Recent Labs  Lab 05/25/18 0409 05/26/18 0355  05/28/18 0316 05/28/18 1650 05/29/18 0304 05/29/18 1042 05/29/18 1615 05/30/18 0329  WBC 14.4* 10.9*  --  14.4*  --   --  17.6*  --  14.9*  CREATININE 0.72 0.72   < > 0.52 0.52 0.54  --  0.57 0.55   < > = values in this interval not displayed.    Estimated Creatinine Clearance: 69.3 mL/min (by C-G formula based on SCr of 0.55 mg/dL).    No Active Allergies  Antimicrobials this admission: Vanco 2/15 x1, 2/26>> Azith 2/15 >> 2/17 Ceftriaxone 2/15 >>2/22; 2/25 >>    Thank you for allowing pharmacy to be a part of this patient's care.  Jimmy Footman, PharmD, BCPS, BCIDP Infectious Diseases Clinical Pharmacist Phone: [phone] 05/30/2018 3:52 PM

## 2018-05-31 DIAGNOSIS — J159 Unspecified bacterial pneumonia: Secondary | ICD-10-CM

## 2018-05-31 DIAGNOSIS — J9584 Transfusion-related acute lung injury (TRALI): Secondary | ICD-10-CM

## 2018-05-31 LAB — CBC
HCT: 32.3 % — ABNORMAL LOW (ref 36.0–46.0)
Hemoglobin: 9.4 g/dL — ABNORMAL LOW (ref 12.0–15.0)
MCH: 28.4 pg (ref 26.0–34.0)
MCHC: 29.1 g/dL — ABNORMAL LOW (ref 30.0–36.0)
MCV: 97.6 fL (ref 80.0–100.0)
Platelets: 465 10*3/uL — ABNORMAL HIGH (ref 150–400)
RBC: 3.31 MIL/uL — ABNORMAL LOW (ref 3.87–5.11)
RDW: 17 % — ABNORMAL HIGH (ref 11.5–15.5)
WBC: 16.9 10*3/uL — ABNORMAL HIGH (ref 4.0–10.5)
nRBC: 0 % (ref 0.0–0.2)

## 2018-05-31 LAB — GLUCOSE, CAPILLARY
GLUCOSE-CAPILLARY: 128 mg/dL — AB (ref 70–99)
Glucose-Capillary: 128 mg/dL — ABNORMAL HIGH (ref 70–99)
Glucose-Capillary: 124 mg/dL — ABNORMAL HIGH (ref 70–99)
Glucose-Capillary: 129 mg/dL — ABNORMAL HIGH (ref 70–99)
Glucose-Capillary: 122 mg/dL — ABNORMAL HIGH (ref 70–99)

## 2018-05-31 LAB — BASIC METABOLIC PANEL
Anion gap: 10 (ref 5–15)
BUN: 24 mg/dL — ABNORMAL HIGH (ref 6–20)
CO2: 36 mmol/L — ABNORMAL HIGH (ref 22–32)
Calcium: 8.3 mg/dL — ABNORMAL LOW (ref 8.9–10.3)
Chloride: 97 mmol/L — ABNORMAL LOW (ref 98–111)
Creatinine, Ser: 0.61 mg/dL (ref 0.44–1.00)
GFR calc Af Amer: >60 mL/min (ref >60)
GFR calc non Af Amer: >60 mL/min (ref >60)
Glucose, Bld: 129 mg/dL — ABNORMAL HIGH (ref 70–99)
Potassium: 3.6 mmol/L (ref 3.5–5.1)
Sodium: 143 mmol/L (ref 135–145)

## 2018-05-31 LAB — MAGNESIUM
Magnesium: 2.3 mg/dL (ref 1.7–2.4)
Magnesium: 2 mg/dL (ref 1.7–2.4)

## 2018-05-31 LAB — PHOSPHORUS
Phosphorus: 2.8 mg/dL (ref 2.5–4.6)
Phosphorus: 2.8 mg/dL (ref 2.5–4.6)

## 2018-05-31 MED ORDER — FLUOXETINE HCL 20 MG/5ML PO SOLN
10.0000 mg | Freq: Every day | ORAL | Status: DC
  Administered 2018-05-31 – 2018-06-04 (×5): 10 mg via ORAL
  Filled 2018-05-31 (×5): qty 5

## 2018-05-31 MED ORDER — VALPROIC ACID 250 MG/5ML PO SOLN
125.0000 mg | Freq: Every day | ORAL | Status: DC
  Administered 2018-06-01 – 2018-06-05 (×5): 125 mg
  Filled 2018-05-31 (×5): qty 5

## 2018-05-31 NOTE — Progress Notes (Signed)
NAME:  Christine Ortega, MRN:  [id number], DOB:  [birth year], LOS: 63 ADMISSION DATE:  05/19/2018, CONSULTATION DATE: 2 15 2020 REFERRING MD: GI services, CHIEF COMPLAINT: GI bleed, pneumonia, blood loss anemia shock  Brief History   58 year old with UGI bleed intubated for endoscopy s/p IR embolization of gastroduodenal artery 2/16 for large bleeding gastric ulcer. She had bilateral infiltrates and 3 L oxygen requirement on admission, presumptively diagnosed as community-acquired pneumonia. Intubated. Ecoli UTI.   Past Medical History  Attention deficit disorder, Schizophrenia, Bipolar, History of chronic opioid abuse, History of tobacco abuse, Overuse of NSAIDs  Significant Hospital Events   05/19/2018 due to excessive GI bleeding transferred to intensive care unit with plan for endoscopy. 2/15 - Flu PCR - neg 2/16 >> change from Precedex to propofol; extubated. 2/18 increasing FIO2 needs. Failed BIPAP; reintubated.  2/19 still hypoxic. CXR w/ diffuse airspace disease. PPlats 38 so titrated down to 6 cc/kg PBW. CT obtained. Neg for PE. Was started on NMB.  2/20: off NMB. Still using high RASS goal of -3 to -4. FIO2 and peep down. Still on low dose pressors. Getting lasix.  2/21: FT placed. Diuresing  2/22 cont diuresis. Transition to precedex.  2/23 weaning on 12/5 PSV 2/26 weaning on 10/5 PSV, mental status remains barrier to extubation (is intermittently awake but not following commands) 2/27 weaning on 5/5, more alert  Consults:  05/19/2018 pulmonary critical care 05/19/2018 GI and IR  Procedures:  2/15 ETT >> 2/16 2/15 R femoral cordis >>2/18 -pt pulled out 2/15 left femoral aline >> 2/16 > embolization of GDA  Significant Diagnostic Tests:  05/19/2018 endoscopy>>  - Normal esophagus. - Red blood in the entire stomach with significant clot burden - as above time not taken to clear the entire stomach due to obvious pathology at the antrum. It is possible there is pathology in the  fundus / proximal stomach to cause bleeding but would not have been visualized on this exam. - Giant gastric ulcer at the antrum entering duodenal bulb - not amenable to endoscopic therapy given appearance and poor ability to visualize the area (of note, hemospray currently not available due to FDA recall). The procedure was aborted due to tenous status with need for more definitive therapy - Blood in the entire examined duodenum. This patient is having life threatening bleeding presumed due to giant gastric ulcer which did not appear amenable to endoscopic therapy. IR was called for attempt at embolization. 2/16 - Post mesenteric arteriogram and empiric embolization of the GDA.   2/18 reintubated. For on-going resp failure.  2/19: Chest x-ray fairly stable.  Increasing PEEP and FiO2 requirements out of proportion to chest x-ray changes.  CT chest ordered to rule out pulmonary emboli > negative.  Micro Data:  05/19/2018 blood cultures x2>> ng 05/19/2018 urine culture >>  >100k e.Coli >> sens to CTX 05/19/2018 sputum culture >> nl resp flora 2/15 RVP - neg 2/24 Blood Cx >> negative 2/24 Urine Cx >> negative 2/25 Resp Cx >>  Antimicrobials:  ceftriaxone 2/15 >> 2/23, 2/25 > Zithromax 2/15 >> 2/17 Vancomycin 2/15, 2/26 >  Interim history/subjective:  Remains intermittently febrile, started Vanc yesterday. More alert today.  Objective   Blood pressure (!) 152/62, pulse (!) 112, temperature 99.7 F (37.6 C), temperature source Oral, resp. rate (!) 27, height 5\' 3"  (1.6 m), weight 63.4 kg, SpO2 98 %.    Vent Mode: PRVC FiO2 (%):  [40 %] 40 % Set Rate:  [24 bmp] 24 bmp  Vt Set:  [320 mL] 320 mL PEEP:  [5 cmH20] 5 cmH20 Plateau Pressure:  [19 cmH20-21 cmH20] 19 cmH20   Intake/Output Summary (Last 24 hours) at 05/31/2018 0716 Last data filed at 05/31/2018 0654 Gross per 24 hour  Intake 1797.32 ml  Output 2785 ml  Net -987.68 ml   Filed Weights   05/29/18 0500 05/30/18 0500 05/31/18  0417  Weight: 65.2 kg 62.9 kg 63.4 kg   General:  Lying in bed, alerts to voice HENT: ETT in place PULM: CTAB CV: RRR, no mrg GI: soft, +BS, NTND MSK: normal bulk and tone, improved edema Neuro: alerts to voice, shakes head "yes", "no" appropriately  Resolved Hospital Problem list   Hemorrhagic shock 2/2 UGIB: s/p 7 units PRBC, Hgb 4.1 >> 11 >> now stable 8.5 Sedation related hypotension  Assessment & Plan:   ARDS Continues with good UOP with Lasix, net +1L. CXR yesterday much improved. Continues to wean, currently 5/5. Plan Continue PSV wean Mental status needs to improve before considering extubation VAP prevention Continue daily Lasix Follow CXR  Acute severe UGIB w/ profound acute blood loss anemia  - found to have bleeding gastric ulcer 2/2 NSAID use s/p IR embolization of GDA  -GI following -h pylori was equivocal  -Hemoglobin has been stable for over 72 hours Plan Continue PPI bid SCD for dvt prevention Monitor for bleeding Transfuse PRBC for Hgb < 7 gm/dL  History attention deficit disorder, bipolar, depression, schizophrenia chronic pain Acute Encephalopathy/ Delirium  Plan Continue depakote per tube Minimize sedation  QTc prolongation Plan Avoid QTc prolonging drugs  Ecoli UTI s/p abx Fever, ?resp source w/ persistent leukocytosis Plan Continue CTX, Vanc Follow cultures, CXR   Demand ischemia Plan Tele Supportive care   Foot drop Plan Prevalon boot  Constipation Plan Stool noted yesterday. Continue bowel regimen.  Best practice:  Diet: Tube feeding Pain/Anxiety/Delirium protocol (if indicated): fentanyl PAD protocol, limit as able VAP protocol (if indicated): yes DVT prophylaxis: SCD- no anticoagulation with GI bleed GI prophylaxis: PPI bid Glucose control: SSI Mobility: Bedrest Code Status: Full Family Communication: updated daughter at bedside 2/27 Disposition: ICU  Rory Percy, DO PGY-2, [facility] 05/31/2018 7:22 AM

## 2018-05-31 NOTE — Progress Notes (Addendum)
PT Cancellation Note  Patient Details Name: Christine Ortega MRN: [id number] DOB: Mar 24, 1961   Cancelled Treatment:    Reason Eval/Treat Not Completed: Other (comment)(order received however order is only for foot drop boot not evaluation of pt on vent) PRAFO are supplied by SPD. Please reorder should pt mobility be desired. Pt also on bedrest per critical care note.    Sandy Salaam Weylyn Ricciuti 05/31/2018, 12:46 PM  Elwyn Reach, PT Acute Rehabilitation Services Pager: [phone] Office: [phone]

## 2018-05-31 NOTE — Progress Notes (Signed)
PCCM:  Persistent fevers. BL duplexes ordered to r/o VTE   Daughter updated.   Christine Ortega Pulmonary Critical Care 05/31/2018 6:03 PM

## 2018-05-31 NOTE — Progress Notes (Signed)
Orthopedic Tech Progress Note Patient Details:  Christine Ortega 27-Dec-1960 [id number] Called in brace order Patient ID: [name], female   DOB: [birth year], 58 y.o.   MRN: [id number]   [name] 05/31/2018, 9:02 AM

## 2018-06-01 ENCOUNTER — Inpatient Hospital Stay (HOSPITAL_COMMUNITY): Payer: Medicare Other

## 2018-06-01 DIAGNOSIS — F419 Anxiety disorder, unspecified: Secondary | ICD-10-CM

## 2018-06-01 DIAGNOSIS — Z9889 Other specified postprocedural states: Secondary | ICD-10-CM

## 2018-06-01 DIAGNOSIS — I82409 Acute embolism and thrombosis of unspecified deep veins of unspecified lower extremity: Secondary | ICD-10-CM

## 2018-06-01 DIAGNOSIS — K254 Chronic or unspecified gastric ulcer with hemorrhage: Secondary | ICD-10-CM

## 2018-06-01 DIAGNOSIS — F988 Other specified behavioral and emotional disorders with onset usually occurring in childhood and adolescence: Secondary | ICD-10-CM

## 2018-06-01 DIAGNOSIS — Z95828 Presence of other vascular implants and grafts: Secondary | ICD-10-CM

## 2018-06-01 DIAGNOSIS — F209 Schizophrenia, unspecified: Secondary | ICD-10-CM

## 2018-06-01 DIAGNOSIS — Z8709 Personal history of other diseases of the respiratory system: Secondary | ICD-10-CM

## 2018-06-01 DIAGNOSIS — Z87891 Personal history of nicotine dependence: Secondary | ICD-10-CM

## 2018-06-01 DIAGNOSIS — R569 Unspecified convulsions: Secondary | ICD-10-CM

## 2018-06-01 DIAGNOSIS — F319 Bipolar disorder, unspecified: Secondary | ICD-10-CM

## 2018-06-01 DIAGNOSIS — Z791 Long term (current) use of non-steroidal anti-inflammatories (NSAID): Secondary | ICD-10-CM

## 2018-06-01 DIAGNOSIS — M549 Dorsalgia, unspecified: Secondary | ICD-10-CM

## 2018-06-01 DIAGNOSIS — Z960 Presence of urogenital implants: Secondary | ICD-10-CM

## 2018-06-01 DIAGNOSIS — F1911 Other psychoactive substance abuse, in remission: Secondary | ICD-10-CM

## 2018-06-01 DIAGNOSIS — Z8701 Personal history of pneumonia (recurrent): Secondary | ICD-10-CM

## 2018-06-01 DIAGNOSIS — R509 Fever, unspecified: Secondary | ICD-10-CM

## 2018-06-01 DIAGNOSIS — J9690 Respiratory failure, unspecified, unspecified whether with hypoxia or hypercapnia: Secondary | ICD-10-CM

## 2018-06-01 DIAGNOSIS — M7989 Other specified soft tissue disorders: Secondary | ICD-10-CM

## 2018-06-01 DIAGNOSIS — G8929 Other chronic pain: Secondary | ICD-10-CM

## 2018-06-01 LAB — CBC
HCT: 34.3 % — ABNORMAL LOW (ref 36.0–46.0)
Hemoglobin: 10 g/dL — ABNORMAL LOW (ref 12.0–15.0)
MCH: 28.6 pg (ref 26.0–34.0)
MCHC: 29.2 g/dL — AB (ref 30.0–36.0)
MCV: 98 fL (ref 80.0–100.0)
Platelets: 611 10*3/uL — ABNORMAL HIGH (ref 150–400)
RBC: 3.5 MIL/uL — ABNORMAL LOW (ref 3.87–5.11)
RDW: 17.2 % — ABNORMAL HIGH (ref 11.5–15.5)
WBC: 19.9 10*3/uL — ABNORMAL HIGH (ref 4.0–10.5)
nRBC: 0 % (ref 0.0–0.2)

## 2018-06-01 LAB — BASIC METABOLIC PANEL
Anion gap: 8 (ref 5–15)
BUN: 26 mg/dL — ABNORMAL HIGH (ref 6–20)
CO2: 34 mmol/L — AB (ref 22–32)
Calcium: 8.7 mg/dL — ABNORMAL LOW (ref 8.9–10.3)
Chloride: 102 mmol/L (ref 98–111)
Creatinine, Ser: 0.59 mg/dL (ref 0.44–1.00)
GFR calc Af Amer: >60 mL/min (ref >60)
GFR calc non Af Amer: >60 mL/min (ref >60)
Glucose, Bld: 137 mg/dL — ABNORMAL HIGH (ref 70–99)
Potassium: 3.9 mmol/L (ref 3.5–5.1)
Sodium: 144 mmol/L (ref 135–145)

## 2018-06-01 LAB — PHOSPHORUS
Phosphorus: 3.1 mg/dL (ref 2.5–4.6)
Phosphorus: 3.6 mg/dL (ref 2.5–4.6)

## 2018-06-01 LAB — GLUCOSE, CAPILLARY
GLUCOSE-CAPILLARY: 146 mg/dL — AB (ref 70–99)
Glucose-Capillary: 111 mg/dL — ABNORMAL HIGH (ref 70–99)
Glucose-Capillary: 112 mg/dL — ABNORMAL HIGH (ref 70–99)
Glucose-Capillary: 117 mg/dL — ABNORMAL HIGH (ref 70–99)
Glucose-Capillary: 127 mg/dL — ABNORMAL HIGH (ref 70–99)
Glucose-Capillary: 132 mg/dL — ABNORMAL HIGH (ref 70–99)

## 2018-06-01 LAB — HEMOGLOBIN AND HEMATOCRIT, BLOOD
HCT: 33.7 % — ABNORMAL LOW (ref 36.0–46.0)
Hemoglobin: 9.9 g/dL — ABNORMAL LOW (ref 12.0–15.0)

## 2018-06-01 LAB — CULTURE, RESPIRATORY W GRAM STAIN: Culture: NORMAL

## 2018-06-01 LAB — MAGNESIUM
MAGNESIUM: 2.1 mg/dL (ref 1.7–2.4)
Magnesium: 2 mg/dL (ref 1.7–2.4)

## 2018-06-01 LAB — HEPARIN LEVEL (UNFRACTIONATED): Heparin Unfractionated: 0.19 IU/mL — ABNORMAL LOW (ref 0.30–0.70)

## 2018-06-01 MED ORDER — WHITE PETROLATUM EX OINT
TOPICAL_OINTMENT | CUTANEOUS | Status: AC
  Filled 2018-06-01: qty 28.35

## 2018-06-01 MED ORDER — HEPARIN (PORCINE) 25000 UT/250ML-% IV SOLN
1450.0000 [IU]/h | INTRAVENOUS | Status: DC
  Administered 2018-06-01: 1000 [IU]/h via INTRAVENOUS
  Administered 2018-06-02 – 2018-06-04 (×3): 1300 [IU]/h via INTRAVENOUS
  Administered 2018-06-05: 1450 [IU]/h via INTRAVENOUS
  Administered 2018-06-05: 1350 [IU]/h via INTRAVENOUS
  Filled 2018-06-01 (×6): qty 250

## 2018-06-01 MED ORDER — WHITE PETROLATUM EX OINT
TOPICAL_OINTMENT | CUTANEOUS | Status: AC
  Administered 2018-06-01: 0.2
  Filled 2018-06-01: qty 28.35

## 2018-06-01 MED ORDER — LIDOCAINE VISCOUS HCL 2 % MT SOLN
15.0000 mL | Freq: Once | OROMUCOSAL | Status: AC
  Administered 2018-06-01: 15 mL via ORAL
  Filled 2018-06-01: qty 15

## 2018-06-01 MED ORDER — ALUM & MAG HYDROXIDE-SIMETH 200-200-20 MG/5ML PO SUSP
30.0000 mL | Freq: Once | ORAL | Status: AC
  Administered 2018-06-01: 30 mL via ORAL
  Filled 2018-06-01: qty 30

## 2018-06-01 NOTE — Progress Notes (Signed)
06/01/2018 2220 Called to patient room by family member regarding patient c/o chest pain. Patient Lung sounds clear and heart sounds normal. HR at patient's baseline of 103. Patient falls back asleep while performing EKG at bedside, asked patient if she is still having chest pain and she states yes, 10/10. Call placed to [facility] . Awaiting orders.   06/01/2018 2300   Patient Describes chest pain as more epigastric. MD placed orders for maalox and lidocaine.   06/01/2018 2310  Entered patient's room to give maalox and patient states no longer having pain- per family patient passed gas and feels better now.   Will continue to monitor patient for any recurrent chest pain.  Milford Cage, RN

## 2018-06-01 NOTE — Progress Notes (Signed)
Bilateral upper and lower extremities venous duplex exam completed. Please see preliminary notes on CV PROC.  Result notified RN. Jaeanna Mccomber H Emoree Sasaki(RDMS RVT) 06/01/18 3:59 PM

## 2018-06-01 NOTE — Progress Notes (Addendum)
NAME:  Christine Ortega, MRN:  [id number], DOB:  [birth year], LOS: 35 ADMISSION DATE:  05/19/2018, CONSULTATION DATE: 2 15 2020 REFERRING MD: GI services, CHIEF COMPLAINT: GI bleed, pneumonia, blood loss anemia shock  Brief History   58 year old with UGI bleed intubated for endoscopy s/p IR embolization of gastroduodenal artery 2/16 for large bleeding gastric ulcer. She had bilateral infiltrates and 3 L oxygen requirement on admission, presumptively diagnosed as community-acquired pneumonia. Intubated. Ecoli UTI.   Past Medical History  Attention deficit disorder, Schizophrenia, Bipolar, History of chronic opioid abuse, History of tobacco abuse, Overuse of NSAIDs  Significant Hospital Events   05/19/2018 due to excessive GI bleeding transferred to intensive care unit with plan for endoscopy. 2/15 - Flu PCR - neg 2/16 >> change from Precedex to propofol; extubated. 2/18 increasing FIO2 needs. Failed BIPAP; reintubated.  2/19 still hypoxic. CXR w/ diffuse airspace disease. PPlats 38 so titrated down to 6 cc/kg PBW. CT obtained. Neg for PE. Was started on NMB.  2/20: off NMB. Still using high RASS goal of -3 to -4. FIO2 and peep down. Still on low dose pressors. Getting lasix.  2/21: FT placed. Diuresing  2/22 cont diuresis. Transition to precedex.  2/23 weaning on 12/5 PSV 2/26 weaning on 10/5 PSV, mental status remains barrier to extubation (is intermittently awake but not following commands) 2/27 weaning on 5/5, more alert  Consults:  05/19/2018 pulmonary critical care 05/19/2018 GI and IR  Procedures:  2/15 ETT >> 2/16 2/15 R femoral cordis >>2/18 -pt pulled out 2/15 left femoral aline >> 2/16 > embolization of GDA  Significant Diagnostic Tests:  05/19/2018 endoscopy>>  - Normal esophagus. - Red blood in the entire stomach with significant clot burden - as above time not taken to clear the entire stomach due to obvious pathology at the antrum. It is possible there is pathology in the  fundus / proximal stomach to cause bleeding but would not have been visualized on this exam. - Giant gastric ulcer at the antrum entering duodenal bulb - not amenable to endoscopic therapy given appearance and poor ability to visualize the area (of note, hemospray currently not available due to FDA recall). The procedure was aborted due to tenous status with need for more definitive therapy - Blood in the entire examined duodenum. This patient is having life threatening bleeding presumed due to giant gastric ulcer which did not appear amenable to endoscopic therapy. IR was called for attempt at embolization. 2/16 - Post mesenteric arteriogram and empiric embolization of the GDA.   2/18 reintubated. For on-going resp failure.  2/19: Chest x-ray fairly stable.  Increasing PEEP and FiO2 requirements out of proportion to chest x-ray changes.  CT chest ordered to rule out pulmonary emboli > negative.  Micro Data:  05/19/2018 blood cultures x2>> ng 05/19/2018 urine culture >>  >100k e.Coli >> sens to CTX 05/19/2018 sputum culture >> nl resp flora 2/15 RVP - neg 2/24 Blood Cx >> negative 2/24 Urine Cx >> negative 2/25 Resp Cx >>  Antimicrobials:  ceftriaxone 2/15 >> 2/23, 2/25 > Zithromax 2/15 >> 2/17 Vancomycin 2/15, 2/26 >  Interim history/subjective:  Still with intermittent fevers. Good UOP.  Objective   Blood pressure (!) 142/91, pulse (!) 124, temperature 99.5 F (37.5 C), temperature source Axillary, resp. rate (!) 25, height 5\' 3"  (1.6 m), weight 61.9 kg, SpO2 98 %.    Vent Mode: CPAP;PSV FiO2 (%):  [40 %] 40 % PEEP:  [5 cmH20] 5 cmH20 Pressure Support:  [5  cmH20] 5 cmH20 Plateau Pressure:  [18 cmH20] 18 cmH20   Intake/Output Summary (Last 24 hours) at 06/01/2018 0726 Last data filed at 06/01/2018 0631 Gross per 24 hour  Intake 1746 ml  Output 3725 ml  Net -1979 ml   Filed Weights   05/30/18 0500 05/31/18 0417 06/01/18 0438  Weight: 62.9 kg 63.4 kg 61.9 kg   General:   More alert, follows commands HENT: ETT in place PULM: CTAB CV: RRR, no murmur GI: soft, NTND, +BS MSK: edema much improved. LUE with persistent NP edema. Neuro: much more alert and interactive today  Resolved Hospital Problem list   Hemorrhagic shock 2/2 UGIB: s/p 7 units PRBC, Hgb 4.1 >> 11 >> now stable 8.5 Sedation related hypotension  Assessment & Plan:   ARDS Continues with good UOP with Lasix, net -500cc. Continues to wean, currently 5/5. CXR slightly worsened today from prior. Plan Continue PSV wean Hopeful for extubation today if MS improves VAP prevention Continue daily Lasix Follow CXR  Ecoli UTI s/p abx Fever, ?resp source w/ persistent leukocytosis Plan Continue CTX, Vanc Follow cultures, CXR  F/u U/LE dopplers  Acute severe UGIB w/ profound acute blood loss anemia  - found to have bleeding gastric ulcer 2/2 NSAID use s/p IR embolization of GDA  -GI following -h pylori was equivocal  -Hemoglobin has been stable for over 72 hours Plan Continue PPI bid SCD for dvt prevention Monitor for bleeding Transfuse PRBC for Hgb < 7 gm/dL  History attention deficit disorder, bipolar, depression, schizophrenia, chronic pain Acute Encephalopathy/ Delirium, improving Plan Continue depakote per tube Prozac Minimize sedation  QTc prolongation Plan Avoid QTc prolonging drugs  Foot drop Plan Prevalon boot  Constipation Plan Stool noted yesterday. Continue bowel regimen.  Best practice:  Diet: Tube feeding Pain/Anxiety/Delirium protocol (if indicated): fentanyl PAD protocol, limit as able VAP protocol (if indicated): yes DVT prophylaxis: SCD- no anticoagulation with GI bleed GI prophylaxis: PPI bid Glucose control: SSI Mobility: Bedrest Code Status: Full Family Communication: updated daughter at bedside 2/28 Disposition: ICU  Rory Percy, DO PGY-2, [facility] 06/01/2018 7:26 AM

## 2018-06-01 NOTE — Consult Note (Signed)
[facility] for Infect[facility]    Date of Admission:  05/19/2018     Total days of antibiotics 11    8d ceftriaxone then now on vanc + ceftriaxone x 2d              Reason for Consult: Fevers     Referring Provider: PCCM  Primary Care Provider: Default, Provider, MD   Assessment: Christine Ortega is a 58 y.o. female with history of severe upper GI bleed 2/2 gastric ulcer in the setting of large amounts of NSAID use to treat chronic back pain. She initially however presented for SOB and cough from home with 2 week history of this as well as associated subjective f/c. She has had fever despite antibiotics; the only culture data we have was urine that has since cleared with proper treatment of E coli.   She has been difficult to wean from ventilator at first due to hypoxia and now due to mental status - she seems to be improving in this regard based on my exam today. Secretions appear minimal at this point compared to 2d ago.   Her PICC line looks clean however her arm on this side is quite a bit more swollen compared to other side - agree with doppler to r/o DVT.   ?ARDS/TRALI inflammatory process contributing to ongoing fevers. Would stop antibiotics to follow closely as she is non-toxic and in ICU monitored care setting and await cultures/follow clinically over the next 48h.   Plan: 1. Assess DVT LUE. 2. Hold antibiotics for now.  3. Please repeat blood cultures.  4. Trend temperatures/labs/clinical findings  Active Problems:   Chronic pain   Sepsis due to undetermined organism ([place])   Acute blood loss anemia   Community acquired pneumonia   Thrombocytosis ([place])   Upper GI bleed   Acute respiratory failure (HCC)   Bowel perforation ([place])   . chlorhexidine gluconate (MEDLINE KIT)  15 mL Mouth Rinse BID  . Chlorhexidine Gluconate Cloth  6 each Topical Daily  . feeding supplement (PRO-STAT SUGAR FREE 64)  30 mL Per Tube Daily  . FLUoxetine  10 mg Oral Daily    . furosemide  40 mg Intravenous Daily  . insulin aspart  0-9 Units Subcutaneous Q4H  . mouth rinse  15 mL Mouth Rinse 10 times per day  . pantoprazole sodium  40 mg Per Tube BID  . potassium chloride  40 mEq Per Tube Daily  . sodium chloride flush  10-40 mL Intracatheter Q12H  . valproic acid  125 mg Per Tube Daily    HPI: Christine Ortega is a 58 y.o. female with pmhx significant for ADD, anxiety, schizophrenia, bipolar disorder/depression, narcotic abuse (remote), seizures.   Admitted in the setting of severe, life-threatening GI bleed related to giant gastric ulcer at the antrum of the stomach entering the duodenal bulb. Initially presented with 2w history of cough/headache fevers with subjective fevers and found to be hypotensive, tachycardic, leukocytosis 21.4K, Hgb 6. She was taking exorbitant amounts of Goody's powder and many other NSAIDs. She was intubated and directly admitted to ICU. Bleed was not amenable to endoscopic intervention and taken to IR for empiric embolization of the GDA. She was transfused 6 units PRBCs on 2/15 and first fever up to 103.1 F noted 11:00 am on 2/16. She was started initially on Ceftriaxone + Azithromycin for presumed CAP. Urine culture + E Coli (sensitive) and was treated with ceftriaxone for  8d. She was extubated for a brief period of time however less than 48h later required re-intubation d/t hypoxia and bipap failure. CXR indicating Chest x-ray showed widespread reticulonodular interstitial disease. CTA chest negative for PE, ?infection/inflammation bibasilar lungs. Day 5 of hospitalization she was still on low dose vasopressors for hemodynamic support as well as diuretics. Attempts to get her to liberate from ventilator have failed with mental status being the barrier to extubation.   She has for her entire hospitalization had intermittent fevers however over the last 48h they have become more frequent and erratic despite tylenol use. She received  ceftriaxone x 8d with a 48h window when she was off all antibiotics. Resumed ceftriaxone at a lower dose on 2/25 with addition of Vancomycin for ongoing fevers and presumed tracheitis. She has been getting multiple doses of tylenol daily. She is still on the ventilator; PICC line placed 8d ago. Foley exchanged 3d ago.   Chart/hospitalization records reviewed.   Review of Systems: Review of Systems  Unable to perform ROS: Intubated    Past Medical History:  Diagnosis Date  . ADD (attention deficit disorder)   . Anxiety   . Bipolar disorder ([place])   . Chronic pain   . Depression   . Narcotic abuse in remission ([place])   . Schizophrenia ([place])   . Seizures ([place])     Social History   Tobacco Use  . Smoking status: Former Smoker    Packs/day: 0.50  . Smokeless tobacco: Never Used  Substance Use Topics  . Alcohol use: No  . Drug use: No    History reviewed. No pertinent family history. No Active Allergies  OBJECTIVE: Blood pressure (!) 161/88, pulse (!) 130, temperature (!) 101 F (38.3 C), temperature source Oral, resp. rate (!) 26, height _0  (1.6 m), weight 61.9 kg, SpO2 97 %.  Physical Exam Constitutional:      General: She is not in acute distress.    Appearance: She is not toxic-appearing.     Comments: Lethargic but comfortably on ventilator.   HENT:     Mouth/Throat:     Mouth: Mucous membranes are dry.     Pharynx: Oropharynx is clear.  Eyes:     General: No scleral icterus.    Pupils: Pupils are equal, round, and reactive to light.  Neck:     Musculoskeletal: Normal range of motion. No neck rigidity.  Cardiovascular:     Rate and Rhythm: Regular rhythm. Tachycardia present.     Pulses: Normal pulses.     Heart sounds: No murmur.  Pulmonary:     Effort: Pulmonary effort is normal.     Breath sounds: No wheezing.     Comments: Diminished bilateral bases. No adventitious sounds. Currently weaning on pressure support.  Abdominal:     General: Abdomen is  flat. There is no distension.     Palpations: Abdomen is soft.     Tenderness: There is no abdominal tenderness.  Musculoskeletal:        General: No swelling or tenderness.     Right lower leg: No edema.     Left lower leg: No edema.     Comments: LUE with significantly more edema compared to contralateral side. PICC is in place here. Distal extremity adequate pulse/temperature. Wiggles fingers when asked.   Skin:    General: Skin is warm and dry.     Capillary Refill: Capillary refill takes less than 2 seconds.     Findings: No rash.  Comments: No pressure wounds assessed during skin assessment.   Neurological:     Comments: She awakens to name. Nods head to questions appropriately. Breathing comfortably presently. Calm.      Lab Results Lab Results  Component Value Date   WBC 19.9 (H) 06/01/2018   HGB 10.0 (L) 06/01/2018   HCT 34.3 (L) 06/01/2018   MCV 98.0 06/01/2018   PLT 611 (H) 06/01/2018    Lab Results  Component Value Date   CREATININE 0.59 06/01/2018   BUN 26 (H) 06/01/2018   NA 144 06/01/2018   K 3.9 06/01/2018   CL 102 06/01/2018   CO2 34 (H) 06/01/2018    Lab Results  Component Value Date   ALT 61 (H) 05/27/2018   AST 78 (H) 05/27/2018   ALKPHOS 97 05/27/2018   BILITOT 0.2 (L) 05/27/2018     Microbiology: MRSA PCR 2/15 > neg RVP 2/16 > neg Trach Asp 2/16 > rare c/w normal flora  BCx 2/15 > NG final x 2 Urine Cx 2/15 > E Coli 100K col  Urine Cx 2/24 > neg  BCx 2/24 > neg 4d x2 Trach Asp 2/28 > no wbc, no organisms  Janene Madeira, MSN, NP-C [facility] [place] Pager: [phone]  06/01/2018 12:09 PM

## 2018-06-01 NOTE — Progress Notes (Signed)
ANTICOAGULATION CONSULT NOTE  Pharmacy Consult for heparin  Indication: DVT  No Active Allergies  Patient Measurements: Height: '5\' 3"'  (160 cm) Weight: 136 lb 7.4 oz (61.9 kg) IBW/kg (Calculated) : 52.4   Vital Signs: Temp: 98.5 F (36.9 C) (02/28 1545) Temp Source: Axillary (02/28 1545) BP: 133/68 (02/28 1430) Pulse Rate: 117 (02/28 1544)  Labs: Recent Labs    05/30/18 0329 05/30/18 1805 05/31/18 0406 06/01/18 0747  HGB 9.3*  --  9.4* 10.0*  HCT 32.1*  --  32.3* 34.3*  PLT 340  --  465* 611*  CREATININE 0.55 0.62 0.61 0.59    Estimated Creatinine Clearance: 64.2 mL/min (by C-G formula based on SCr of 0.59 mg/dL).   Medical History: Past Medical History:  Diagnosis Date  . ADD (attention deficit disorder)   . Anxiety   . Bipolar disorder ([place])   . Chronic pain   . Depression   . Narcotic abuse in remission ([place])   . Schizophrenia ([place])   . Seizures ([place])     Medications:  Medications Prior to Admission  Medication Sig Dispense Refill Last Dose  . Aspirin-Salicylamide-Caffeine (ARTHRITIS STRENGTH BC POWDER PO) Take 2 packets by mouth 5 (five) times daily as needed (pain/headache).   05/19/2018 at am  . divalproex (DEPAKOTE ER) 500 MG 24 hr tablet Take 500 mg by mouth at bedtime. For moods   05/18/2018 at pm  . FLUoxetine (PROZAC) 40 MG capsule Take 1 capsule (40 mg total) by mouth daily. For depression and anxiety. 30 capsule 0 05/18/2018 at am  . ibuprofen (ADVIL,MOTRIN) 200 MG tablet Take 400-600 mg by mouth every 6 (six) hours as needed for headache (pain).   05/18/2018 at Unknown time  . Melatonin 5 MG CAPS Take 20 mg by mouth at bedtime.    05/18/2018 at pm  . naproxen (NAPROSYN) 500 MG tablet Take 500-1,000 mg by mouth 3 (three) times daily as needed (pain/headache).    05/19/2018 at am  . Probiotic Product (PROBIOTIC PO) Take 1 tablet by mouth daily. Women's probiotic for GI health   05/19/2018 at am  . ziprasidone (GEODON) 80 MG capsule Take one capsule at  bedtime for depression and mood stability. (Patient taking differently: Take 80 mg by mouth at bedtime. for depression and mood stability.) 30 capsule 0 05/18/2018 at pm   Scheduled:  . chlorhexidine gluconate (MEDLINE KIT)  15 mL Mouth Rinse BID  . Chlorhexidine Gluconate Cloth  6 each Topical Daily  . feeding supplement (PRO-STAT SUGAR FREE 64)  30 mL Per Tube Daily  . FLUoxetine  10 mg Oral Daily  . furosemide  40 mg Intravenous Daily  . insulin aspart  0-9 Units Subcutaneous Q4H  . mouth rinse  15 mL Mouth Rinse 10 times per day  . pantoprazole sodium  40 mg Per Tube BID  . potassium chloride  40 mEq Per Tube Daily  . sodium chloride flush  10-40 mL Intracatheter Q12H  . valproic acid  125 mg Per Tube Daily    Assessment: 58 yo female with DVT is the upper and lower extremity. Pharmacy consulted to dose heparin (no bolus). She is noted with recent GIB s/p embolization of giant gastric ulcer on 2/16.  -Hg= 10, plt= 611  Goal of Therapy:  Heparin level 0.3-0.7 units/ml Monitor platelets by anticoagulation protocol: Yes   Plan:  -Begin heparin at 1000 units/hr -Heparin level in 6 hours and daily wth CBC daily  Hildred Laser, PharmD Clinical Pharmacist **Pharmacist phone directory can  now be found on [email or web address] ([email or web address]).  Listed under [place].

## 2018-06-02 ENCOUNTER — Inpatient Hospital Stay (HOSPITAL_COMMUNITY): Payer: Medicare Other

## 2018-06-02 LAB — MAGNESIUM
Magnesium: 2.2 mg/dL (ref 1.7–2.4)
Magnesium: 2.1 mg/dL (ref 1.7–2.4)

## 2018-06-02 LAB — CBC
HCT: 33.6 % — ABNORMAL LOW (ref 36.0–46.0)
Hemoglobin: 9.9 g/dL — ABNORMAL LOW (ref 12.0–15.0)
MCH: 28.7 pg (ref 26.0–34.0)
MCHC: 29.5 g/dL — ABNORMAL LOW (ref 30.0–36.0)
MCV: 97.4 fL (ref 80.0–100.0)
Platelets: 642 10*3/uL — ABNORMAL HIGH (ref 150–400)
RBC: 3.45 MIL/uL — ABNORMAL LOW (ref 3.87–5.11)
RDW: 17.2 % — ABNORMAL HIGH (ref 11.5–15.5)
WBC: 19.1 10*3/uL — ABNORMAL HIGH (ref 4.0–10.5)
nRBC: 0 % (ref 0.0–0.2)

## 2018-06-02 LAB — PHOSPHORUS
Phosphorus: 3.4 mg/dL (ref 2.5–4.6)
Phosphorus: 2.8 mg/dL (ref 2.5–4.6)

## 2018-06-02 LAB — BASIC METABOLIC PANEL
Anion gap: 9 (ref 5–15)
BUN: 30 mg/dL — ABNORMAL HIGH (ref 6–20)
CHLORIDE: 101 mmol/L (ref 98–111)
CO2: 34 mmol/L — ABNORMAL HIGH (ref 22–32)
CREATININE: 0.57 mg/dL (ref 0.44–1.00)
Calcium: 8.9 mg/dL (ref 8.9–10.3)
GFR calc Af Amer: >60 mL/min (ref >60)
GFR calc non Af Amer: >60 mL/min (ref >60)
Glucose, Bld: 143 mg/dL — ABNORMAL HIGH (ref 70–99)
Potassium: 3.9 mmol/L (ref 3.5–5.1)
Sodium: 144 mmol/L (ref 135–145)

## 2018-06-02 LAB — CULTURE, BLOOD (ROUTINE X 2)
Culture: NO GROWTH
Culture: NO GROWTH

## 2018-06-02 LAB — HEMOGLOBIN AND HEMATOCRIT, BLOOD
HCT: 33 % — ABNORMAL LOW (ref 36.0–46.0)
HCT: 33.5 % — ABNORMAL LOW (ref 36.0–46.0)
HCT: 34.4 % — ABNORMAL LOW (ref 36.0–46.0)
Hemoglobin: 9.8 g/dL — ABNORMAL LOW (ref 12.0–15.0)
Hemoglobin: 9.8 g/dL — ABNORMAL LOW (ref 12.0–15.0)
Hemoglobin: 10.2 g/dL — ABNORMAL LOW (ref 12.0–15.0)

## 2018-06-02 LAB — GLUCOSE, CAPILLARY
GLUCOSE-CAPILLARY: 126 mg/dL — AB (ref 70–99)
Glucose-Capillary: 103 mg/dL — ABNORMAL HIGH (ref 70–99)
Glucose-Capillary: 124 mg/dL — ABNORMAL HIGH (ref 70–99)
Glucose-Capillary: 125 mg/dL — ABNORMAL HIGH (ref 70–99)
Glucose-Capillary: 126 mg/dL — ABNORMAL HIGH (ref 70–99)
Glucose-Capillary: 135 mg/dL — ABNORMAL HIGH (ref 70–99)

## 2018-06-02 LAB — URINE CULTURE: Culture: NO GROWTH

## 2018-06-02 LAB — HEPARIN LEVEL (UNFRACTIONATED)
Heparin Unfractionated: 0.18 IU/mL — ABNORMAL LOW (ref 0.30–0.70)
Heparin Unfractionated: >2.2 IU/mL — ABNORMAL HIGH (ref 0.30–0.70)
Heparin Unfractionated: 0.47 IU/mL (ref 0.30–0.70)

## 2018-06-02 MED ORDER — CHLORHEXIDINE GLUCONATE 0.12 % MT SOLN
15.0000 mL | Freq: Two times a day (BID) | OROMUCOSAL | Status: DC
  Administered 2018-06-02 – 2018-06-08 (×11): 15 mL via OROMUCOSAL
  Filled 2018-06-02 (×10): qty 15

## 2018-06-02 MED ORDER — ACETAMINOPHEN 160 MG/5ML PO SOLN
650.0000 mg | Freq: Four times a day (QID) | ORAL | Status: DC | PRN
  Administered 2018-06-02 – 2018-06-04 (×3): 650 mg via ORAL
  Filled 2018-06-02 (×3): qty 20.3

## 2018-06-02 MED ORDER — ORAL CARE MOUTH RINSE
15.0000 mL | Freq: Two times a day (BID) | OROMUCOSAL | Status: DC
  Administered 2018-06-03 – 2018-06-04 (×4): 15 mL via OROMUCOSAL

## 2018-06-02 NOTE — Progress Notes (Signed)
Pt failed bedside swallow screen post extubation. Orders placed for ST eval. Process explained to pt and family. Both state understanding

## 2018-06-02 NOTE — Progress Notes (Signed)
ANTICOAGULATION CONSULT NOTE  Pharmacy Consult for Heparin  Indication: DVT   Patient Measurements: Height: 5\' 3"  (160 cm) Weight: 126 lb 15.8 oz (57.6 kg) IBW/kg (Calculated) : 52.4   Vital Signs: Temp: 98 F (36.7 C) (02/29 0730) Temp Source: Oral (02/29 0730) BP: 137/86 (02/29 1000) Pulse Rate: 119 (02/29 1000)  Labs: Recent Labs    05/31/18 0406 06/01/18 0747 06/01/18 2308 06/02/18 0433 06/02/18 0900  HGB 9.4* 10.0* 9.9* 9.9*  --   HCT 32.3* 34.3* 33.7* 33.6*  --   PLT 465* 611*  --  642*  --   HEPARINUNFRC  --   --  0.19*  --  0.18*  CREATININE 0.61 0.59  --  0.57  --      Medical History: Past Medical History:  Diagnosis Date  . ADD (attention deficit disorder)   . Anxiety   . Bipolar disorder ([place])   . Chronic pain   . Depression   . Narcotic abuse in remission ([place])   . Schizophrenia ([place])   . Seizures [facility])     Assessment: 58 yo F with DVT in the upper and lower extremity. Pharmacy consulted to dose heparin (no bolus). She is noted with recent GIB s/p embolization of giant gastric ulcer on 2/16. Heparin level is subtherapeutic this morning. hgb 9.9, plts high.   Goal of Therapy:  Heparin level 0.3-0.7 units/ml Monitor platelets by anticoagulation protocol: Yes   Plan:  -No bolus with recent GIB -Increase heparin to 1300 units/hr -Check level in 6 hours -Daily HL, CBC   Harvel Quale 06/02/2018 10:57 AM

## 2018-06-02 NOTE — Progress Notes (Signed)
NAME:  Christine Ortega, MRN:  [id number], DOB:  [birth year], LOS: 57 ADMISSION DATE:  05/19/2018, CONSULTATION DATE: 2 15 2020 REFERRING MD: GI services, CHIEF COMPLAINT: GI bleed, pneumonia, blood loss anemia shock  Brief History   58 year old with UGI bleed intubated for endoscopy s/p IR embolization of gastroduodenal artery 2/16 for large bleeding gastric ulcer. She had bilateral infiltrates and 3 L oxygen requirement on admission, presumptively diagnosed as community-acquired pneumonia. Intubated. Ecoli UTI.   Past Medical History  Attention deficit disorder, Schizophrenia, Bipolar, History of chronic opioid abuse, History of tobacco abuse, Overuse of NSAIDs  Significant Hospital Events   05/19/2018 due to excessive GI bleeding transferred to intensive care unit with plan for endoscopy. 2/15 - Flu PCR - neg 2/16 >> change from Precedex to propofol; extubated. 2/18 increasing FIO2 needs. Failed BIPAP; reintubated.  2/19 still hypoxic. CXR w/ diffuse airspace disease. PPlats 38 so titrated down to 6 cc/kg PBW. CT obtained. Neg for PE. Was started on NMB.  2/20: off NMB. Still using high RASS goal of -3 to -4. FIO2 and peep down. Still on low dose pressors. Getting lasix.  2/21: FT placed. Diuresing  2/22 cont diuresis. Transition to precedex.  2/23 weaning on 12/5 PSV 2/26 weaning on 10/5 PSV, mental status remains barrier to extubation (is intermittently awake but not following commands) 2/27 weaning on 5/5, more alert  Consults:  05/19/2018 pulmonary critical care 05/19/2018 GI and IR  Procedures:  2/15 ETT >> 2/16 2/15 R femoral cordis >>2/18 -pt pulled out 2/15 left femoral aline >> 2/16 > embolization of GDA  Significant Diagnostic Tests:  05/19/2018 endoscopy>>  - Normal esophagus. - Red blood in the entire stomach with significant clot burden - as above time not taken to clear the entire stomach due to obvious pathology at the antrum. It is possible there is pathology in the  fundus / proximal stomach to cause bleeding but would not have been visualized on this exam. - Giant gastric ulcer at the antrum entering duodenal bulb - not amenable to endoscopic therapy given appearance and poor ability to visualize the area (of note, hemospray currently not available due to FDA recall). The procedure was aborted due to tenous status with need for more definitive therapy - Blood in the entire examined duodenum. This patient is having life threatening bleeding presumed due to giant gastric ulcer which did not appear amenable to endoscopic therapy. IR was called for attempt at embolization. 2/16 - Post mesenteric arteriogram and empiric embolization of the GDA.   2/18 reintubated. For on-going resp failure.  2/19: Chest x-ray fairly stable.  Increasing PEEP and FiO2 requirements out of proportion to chest x-ray changes.  CT chest ordered to rule out pulmonary emboli > negative.  Micro Data:  05/19/2018 blood cultures x2>> ng 05/19/2018 urine culture >>  >100k e.Coli >> sens to CTX 05/19/2018 sputum culture >> nl resp flora 2/15 RVP - neg 2/24 Blood Cx >> negative 2/24 Urine Cx >> negative 2/25 Resp Cx >>  Antimicrobials:  ceftriaxone 2/15 >> 2/23, 2/25 > Zithromax 2/15 >> 2/17 Vancomycin 2/15, 2/26 > 06/01/2018 on antimicrobials stopped  Interim history/subjective:  No issues overnight.  Hemoglobin stable.  Tolerating heparin.  Updated daughter at bedside.  Objective   Blood pressure 137/86, pulse (!) 119, temperature 98 F (36.7 C), temperature source Oral, resp. rate (!) 22, height 5\' 3"  (1.6 m), weight 57.6 kg, SpO2 98 %.    Vent Mode: CPAP;PSV FiO2 (%):  [40 %] 40 %  PEEP:  [5 cmH20] 5 cmH20 Pressure Support:  [5 cmH20] 5 cmH20 Plateau Pressure:  [14 cmH20-16 cmH20] 14 cmH20   Intake/Output Summary (Last 24 hours) at 06/02/2018 1118 Last data filed at 06/02/2018 1000 Gross per 24 hour  Intake 1165.5 ml  Output 2250 ml  Net -1084.5 ml   Filed Weights    05/31/18 0417 06/01/18 0438 06/02/18 0251  Weight: 63.4 kg 61.9 kg 57.6 kg   General: Elderly female, chronically ill-appearing, intubated, following commands HENT: Tracheal tube in place PULM: Bilateral ventilated breath sounds, no crackles no wheeze CV: Rate and rhythm, S1-S2 GI: Soft, nontender, nondistended, bowel sounds present MSK: Significant edema in the lower extremities does have some edema in the left upper may be slightly better than yesterday Neuro: Is alert more reactive today.  Following all commands.  Resolved Hospital Problem list   Hemorrhagic shock 2/2 UGIB: s/p 7 units PRBC, Hgb 4.1 >> 11 >> now stable 8.5 Sedation related hypotension  Assessment & Plan:   Persistent fevers within the ICU Right lower extremity DVT, left upper extremity DVT does have PICC line in place. - Continue to observe H&H -Heparin started yesterday  ARDS, resolved, likely related to transfusion related acute lung injury Hypoxemic respiratory failure requiring intubation mechanical ventilation Continues with good UOP with Lasix, net -500cc. Continues to wean, currently 5/5. CXR slightly worsened today from prior. Plan Doing well this morning Awake alert after dose reduction of proc acid - Will attempt liberation from mechanical ventilation today  Ecoli UTI s/p abx Plan Completed course of antimicrobials -Appreciate input from infectious disease. -We will monitor off of antimicrobials  Acute severe UGIB w/ profound acute blood loss anemia, stable   - found to have bleeding gastric ulcer 2/2 NSAID use s/p IR embolization of GDA  -GI following -h pylori was equivocal  -Remained stable Plan Continue PPI DVT prophylaxis stop We have started heparin for above DVT we will continue to monitor H&H  History attention deficit disorder, bipolar, depression, schizophrenia, chronic pain Acute Encephalopathy/ Delirium, improving Plan -Reduced Depakote -Small dose Prozac  QTc  prolongation Plan Avoiding QT prolonging drugs  Foot drop Plan Foot drop boots in place  Constipation Plan Stool noted yesterday. Continue bowel regimen.   Mobility Plan We will consult PT and OT  Best practice:  Diet: Tube feeding Pain/Anxiety/Delirium protocol (if indicated): n/a VAP protocol (if indicated): yes DVT prophylaxis: heparin  GI prophylaxis: PPI bid Glucose control: SSI Mobility: Bedrest Code Status: Full Family Communication: updated daughter at bedside Disposition: ICU   Garner Nash, DO [facility] 06/02/2018 11:18 AM  Personal pager: [phone] If unanswered, please page CCM On-call: [phone]

## 2018-06-02 NOTE — Procedures (Signed)
Extubation Procedure Note  Patient Details:   Name: SHAKIRAH KIRKEY DOB: [birth year] MRN: [id number]   Airway Documentation:    Vent end date: 06/02/18 Vent end time: 1115   Evaluation  O2 sats: stable throughout Complications: No apparent complications Patient did tolerate procedure well. Bilateral Breath Sounds: Rhonchi   Yes   Patient extubated to [place]. Vital signs stable at this time. No complications. Daughter and RN at bedside. RT will continue to monitor.  Mcneil Sober 06/02/2018, 11:19 AM

## 2018-06-02 NOTE — Progress Notes (Signed)
[place] for Heparin  Indication: DVT  Allergies  Allergen Reactions  . Tramadol Hcl     Per Daughter- patient has " sleep paralysis"     Patient Measurements: Height: _0  (160 cm) Weight: 136 lb 7.4 oz (61.9 kg) IBW/kg (Calculated) : 52.4   Vital Signs: Temp: 98.3 F (36.8 C) (02/28 2300) Temp Source: Axillary (02/28 2300) BP: 109/74 (02/28 2330) Pulse Rate: 91 (02/28 2330)  Labs: Recent Labs    05/30/18 0329 05/30/18 1805 05/31/18 0406 06/01/18 0747 06/01/18 2308  HGB 9.3*  --  9.4* 10.0* 9.9*  HCT 32.1*  --  32.3* 34.3* 33.7*  PLT 340  --  465* 611*  --   HEPARINUNFRC  --   --   --   --  0.19*  CREATININE 0.55 0.62 0.61 0.59  --     Estimated Creatinine Clearance: 64.2 mL/min (by C-G formula based on SCr of 0.59 mg/dL).   Medical History: Past Medical History:  Diagnosis Date  . ADD (attention deficit disorder)   . Anxiety   . Bipolar disorder ([place])   . Chronic pain   . Depression   . Narcotic abuse in remission ([place])   . Schizophrenia ([place])   . Seizures ([place])     Medications:  Medications Prior to Admission  Medication Sig Dispense Refill Last Dose  . Aspirin-Salicylamide-Caffeine (ARTHRITIS STRENGTH BC POWDER PO) Take 2 packets by mouth 5 (five) times daily as needed (pain/headache).   05/19/2018 at am  . divalproex (DEPAKOTE ER) 500 MG 24 hr tablet Take 500 mg by mouth at bedtime. For moods   05/18/2018 at pm  . FLUoxetine (PROZAC) 40 MG capsule Take 1 capsule (40 mg total) by mouth daily. For depression and anxiety. 30 capsule 0 05/18/2018 at am  . ibuprofen (ADVIL,MOTRIN) 200 MG tablet Take 400-600 mg by mouth every 6 (six) hours as needed for headache (pain).   05/18/2018 at Unknown time  . Melatonin 5 MG CAPS Take 20 mg by mouth at bedtime.    05/18/2018 at pm  . naproxen (NAPROSYN) 500 MG tablet Take 500-1,000 mg by mouth 3 (three) times daily as needed (pain/headache).    05/19/2018 at am  . Probiotic  Product (PROBIOTIC PO) Take 1 tablet by mouth daily. Women's probiotic for GI health   05/19/2018 at am  . ziprasidone (GEODON) 80 MG capsule Take one capsule at bedtime for depression and mood stability. (Patient taking differently: Take 80 mg by mouth at bedtime. for depression and mood stability.) 30 capsule 0 05/18/2018 at pm   Scheduled:  . chlorhexidine gluconate (MEDLINE KIT)  15 mL Mouth Rinse BID  . Chlorhexidine Gluconate Cloth  6 each Topical Daily  . feeding supplement (PRO-STAT SUGAR FREE 64)  30 mL Per Tube Daily  . FLUoxetine  10 mg Oral Daily  . furosemide  40 mg Intravenous Daily  . insulin aspart  0-9 Units Subcutaneous Q4H  . mouth rinse  15 mL Mouth Rinse 10 times per day  . pantoprazole sodium  40 mg Per Tube BID  . potassium chloride  40 mEq Per Tube Daily  . sodium chloride flush  10-40 mL Intracatheter Q12H  . valproic acid  125 mg Per Tube Daily    Assessment: 58 yo F with DVT in the upper and lower extremity. Pharmacy consulted to dose heparin (no bolus). She is noted with recent GIB s/p embolization of giant gastric ulcer on 2/16.  -Hg= 10, plt= 611  2/29 AM update: heparin level below goal, no issues per RN, Hgb stable  Goal of Therapy:  Heparin level 0.3-0.7 units/ml Monitor platelets by anticoagulation protocol: Yes   Plan:  -No bolus with recent GIB -Inc heparin to 1150 units/hr -Re-check heparin level in 6-8 hours  Narda Bonds, PharmD, [place] Pharmacist Phone: [phone]

## 2018-06-02 NOTE — Progress Notes (Signed)
[facility] for Heparin  Indication: DVT  Allergies  Allergen Reactions  . Tramadol Hcl     Per Daughter- patient has " sleep paralysis"     Patient Measurements: Height: 5\' 3"  (160 cm) Weight: 126 lb 15.8 oz (57.6 kg) IBW/kg (Calculated) : 52.4   Vital Signs: Temp: 98.3 F (36.8 C) (02/29 1301) Temp Source: Axillary (02/29 1301) BP: 120/77 (02/29 2000) Pulse Rate: 101 (02/29 2000)  Labs: Recent Labs    05/31/18 0406 06/01/18 0747  06/02/18 0433 06/02/18 0900 06/02/18 1146 06/02/18 1709 06/02/18 1943  HGB 9.4* 10.0*   < > 9.9*  --  9.8* 9.8*  --   HCT 32.3* 34.3*   < > 33.6*  --  33.0* 33.5*  --   PLT 465* 611*  --  642*  --   --   --   --   HEPARINUNFRC  --   --    < >  --  0.18*  --  >2.20* 0.47  CREATININE 0.61 0.59  --  0.57  --   --   --   --    < > = values in this interval not displayed.    Estimated Creatinine Clearance: 64.2 mL/min (by C-G formula based on SCr of 0.57 mg/dL).   Medical History: Past Medical History:  Diagnosis Date  . ADD (attention deficit disorder)   . Anxiety   . Bipolar disorder ([place])   . Chronic pain   . Depression   . Narcotic abuse in remission ([place])   . Schizophrenia ([place])   . Seizures ([place])     Medications:  Medications Prior to Admission  Medication Sig Dispense Refill Last Dose  . Aspirin-Salicylamide-Caffeine (ARTHRITIS STRENGTH BC POWDER PO) Take 2 packets by mouth 5 (five) times daily as needed (pain/headache).   05/19/2018 at am  . divalproex (DEPAKOTE ER) 500 MG 24 hr tablet Take 500 mg by mouth at bedtime. For moods   05/18/2018 at pm  . FLUoxetine (PROZAC) 40 MG capsule Take 1 capsule (40 mg total) by mouth daily. For depression and anxiety. 30 capsule 0 05/18/2018 at am  . ibuprofen (ADVIL,MOTRIN) 200 MG tablet Take 400-600 mg by mouth every 6 (six) hours as needed for headache (pain).   05/18/2018 at Unknown time  . Melatonin 5 MG CAPS Take 20 mg by mouth at bedtime.     05/18/2018 at pm  . naproxen (NAPROSYN) 500 MG tablet Take 500-1,000 mg by mouth 3 (three) times daily as needed (pain/headache).    05/19/2018 at am  . Probiotic Product (PROBIOTIC PO) Take 1 tablet by mouth daily. Women's probiotic for GI health   05/19/2018 at am  . ziprasidone (GEODON) 80 MG capsule Take one capsule at bedtime for depression and mood stability. (Patient taking differently: Take 80 mg by mouth at bedtime. for depression and mood stability.) 30 capsule 0 05/18/2018 at pm   Scheduled:  . chlorhexidine  15 mL Mouth Rinse BID  . Chlorhexidine Gluconate Cloth  6 each Topical Daily  . feeding supplement (PRO-STAT SUGAR FREE 64)  30 mL Per Tube Daily  . FLUoxetine  10 mg Oral Daily  . insulin aspart  0-9 Units Subcutaneous Q4H  . [START ON 06/03/2018] mouth rinse  15 mL Mouth Rinse q12n4p  . pantoprazole sodium  40 mg Per Tube BID  . potassium chloride  40 mEq Per Tube Daily  . sodium chloride flush  10-40 mL Intracatheter Q12H  . valproic acid  125 mg Per Tube Daily    Assessment: 58 yo F with DVT in the upper and lower extremity. Pharmacy consulted to dose heparin (no bolus). She is noted with recent GIB s/p embolization of giant gastric ulcer on 2/16.   -An evening heparin level that was drawn from PICC (into which heparin is infusing) returned at >2.2 but a repeat peripheral stick was therapeutic at 0.47 is most likely the accurate level. No bleeding noted per RN    Goal of Therapy:  Heparin level 0.3-0.7 units/ml Monitor platelets by anticoagulation protocol: Yes   Plan:  -No bolus with recent GIB -Continue heparin at 1300 units/hr -Re check heparin level at [place], PharmD., BCPS Clinical Pharmacist Clinical phone for 06/02/2018 until 10:30pm: [phone]

## 2018-06-03 ENCOUNTER — Inpatient Hospital Stay (HOSPITAL_COMMUNITY): Payer: Medicare Other

## 2018-06-03 DIAGNOSIS — R1310 Dysphagia, unspecified: Secondary | ICD-10-CM

## 2018-06-03 DIAGNOSIS — I82411 Acute embolism and thrombosis of right femoral vein: Secondary | ICD-10-CM

## 2018-06-03 LAB — CBC
HCT: 33.2 % — ABNORMAL LOW (ref 36.0–46.0)
Hemoglobin: 9.8 g/dL — ABNORMAL LOW (ref 12.0–15.0)
MCH: 28.7 pg (ref 26.0–34.0)
MCHC: 29.5 g/dL — ABNORMAL LOW (ref 30.0–36.0)
MCV: 97.1 fL (ref 80.0–100.0)
Platelets: 707 10*3/uL — ABNORMAL HIGH (ref 150–400)
RBC: 3.42 MIL/uL — AB (ref 3.87–5.11)
RDW: 17.2 % — ABNORMAL HIGH (ref 11.5–15.5)
WBC: 14.5 10*3/uL — ABNORMAL HIGH (ref 4.0–10.5)
nRBC: 0 % (ref 0.0–0.2)

## 2018-06-03 LAB — MAGNESIUM
MAGNESIUM: 2.2 mg/dL (ref 1.7–2.4)
Magnesium: 2.1 mg/dL (ref 1.7–2.4)

## 2018-06-03 LAB — GLUCOSE, CAPILLARY
Glucose-Capillary: 100 mg/dL — ABNORMAL HIGH (ref 70–99)
Glucose-Capillary: 107 mg/dL — ABNORMAL HIGH (ref 70–99)
Glucose-Capillary: 112 mg/dL — ABNORMAL HIGH (ref 70–99)
Glucose-Capillary: 114 mg/dL — ABNORMAL HIGH (ref 70–99)
Glucose-Capillary: 115 mg/dL — ABNORMAL HIGH (ref 70–99)
Glucose-Capillary: 128 mg/dL — ABNORMAL HIGH (ref 70–99)
Glucose-Capillary: 94 mg/dL (ref 70–99)

## 2018-06-03 LAB — HEPARIN LEVEL (UNFRACTIONATED): Heparin Unfractionated: 0.41 IU/mL (ref 0.30–0.70)

## 2018-06-03 LAB — HEMOGLOBIN AND HEMATOCRIT, BLOOD
HCT: 33.5 % — ABNORMAL LOW (ref 36.0–46.0)
HCT: 32.8 % — ABNORMAL LOW (ref 36.0–46.0)
Hemoglobin: 9.8 g/dL — ABNORMAL LOW (ref 12.0–15.0)
Hemoglobin: 9.7 g/dL — ABNORMAL LOW (ref 12.0–15.0)

## 2018-06-03 LAB — CULTURE, RESPIRATORY W GRAM STAIN: Culture: NORMAL

## 2018-06-03 LAB — PHOSPHORUS
Phosphorus: 2.9 mg/dL (ref 2.5–4.6)
Phosphorus: 2.7 mg/dL (ref 2.5–4.6)

## 2018-06-03 LAB — CULTURE, RESPIRATORY: GRAM STAIN: NONE SEEN

## 2018-06-03 MED ORDER — OXYCODONE-ACETAMINOPHEN 5-325 MG PO TABS
1.0000 | ORAL_TABLET | Freq: Once | ORAL | Status: AC
  Administered 2018-06-04: 1 via ORAL
  Filled 2018-06-03: qty 1

## 2018-06-03 MED ORDER — NYSTATIN 100000 UNIT/GM EX CREA
TOPICAL_CREAM | Freq: Three times a day (TID) | CUTANEOUS | Status: DC
  Administered 2018-06-03 – 2018-06-06 (×9): via TOPICAL
  Administered 2018-06-06: 1 via TOPICAL
  Administered 2018-06-07 – 2018-06-08 (×2): via TOPICAL
  Filled 2018-06-03: qty 15

## 2018-06-03 NOTE — Progress Notes (Signed)
eLink Physician-Brief Progress Note Patient Name: Christine Ortega DOB: 01-27-1961 MRN: [id number]   Date of Service  06/03/2018  HPI/Events of Note  Chronic back pain. Asking for Oxycododne. Used in past. Fentanyl IV did not work. Extubated yesterday. On 4 lit o2. Weight BMI 25.  eICU Interventions  Oxycodone 5/325 mg oral once ordered. Watch for lethargy, hypoxemia. Asp precautions. Discussed with bed side RN.      Intervention Category Intermediate Interventions: Pain - evaluation and management Minor Interventions: Communication with other healthcare providers and/or family  Elmer Sow 06/03/2018, 9:07 PM

## 2018-06-03 NOTE — Progress Notes (Signed)
ANTICOAGULATION CONSULT NOTE  Pharmacy Consult for Heparin  Indication: DVT  Patient Measurements: Height: 5\' 3"  (160 cm) Weight: 142 lb 6.7 oz (64.6 kg) IBW/kg (Calculated) : 52.4   Vital Signs: Temp: 97.4 F (36.3 C) (03/01 0844) Temp Source: Axillary (03/01 0844) BP: 118/79 (03/01 0700) Pulse Rate: 84 (03/01 0600)  Labs: Recent Labs    06/01/18 0747  06/02/18 0433  06/02/18 1709 06/02/18 1943 06/02/18 2236 06/03/18 0408 06/03/18 0410  HGB 10.0*   < > 9.9*   < > 9.8*  --  10.2* 9.8*  --   HCT 34.3*   < > 33.6*   < > 33.5*  --  34.4* 33.2*  --   PLT 611*  --  642*  --   --   --   --  707*  --   HEPARINUNFRC  --    < >  --    < > >2.20* 0.47  --   --  0.41  CREATININE 0.59  --  0.57  --   --   --   --   --   --    < > = values in this interval not displayed.     Medical History: Past Medical History:  Diagnosis Date  . ADD (attention deficit disorder)   . Anxiety   . Bipolar disorder ([place])   . Chronic pain   . Depression   . Narcotic abuse in remission ([place])   . Schizophrenia ([place])   . Seizures [facility])     Assessment: 58 yo F with DVT in the upper and lower extremity. Pharmacy consulted to dose heparin (no bolus). She is noted with recent GIB s/p embolization of giant gastric ulcer on 2/16.   Heparin level is therapeutic. hgb 9.8, plts high.   Goal of Therapy:  Heparin level 0.3-0.7 units/ml Monitor platelets by anticoagulation protocol: Yes    Plan:  -No bolus with recent GIB -Continue heparin at 1300 units/hr    Harvel Quale 06/03/2018 9:11 AM

## 2018-06-03 NOTE — Evaluation (Signed)
Physical Therapy Evaluation Patient Details Name: Christine Ortega MRN: [id number] DOB: 06/18/1960 Today's Date: 06/03/2018   History of Present Illness  58 year old with UGI bleed intubated 2/15-2/17 for endoscopy s/p IR embolization of gastroduodenal artery for large bleeding gastric ulcer. Pt reintubated 2/19-2/29.  She had bilateral infiltrates and 3 L oxygen requirement on admission, presumptively diagnosed as community-acquired pneumonia. Pt also with + DVT.  Hx ADD, bipolar, depression, schizophrenia, chronic pain with hx use of NSAIDS.    Clinical Impression  Pt admitted with above diagnosis. Pt currently with functional limitations due to the deficits listed below (see PT Problem List). Nursing did not want pt OOB.  Eval completed and pt sat EOB 4 min and c/o dizziness and fatigue therefore laid her down.  Will follow acutely.   Pt will benefit from skilled PT to increase their independence and safety with mobility to allow discharge to the venue listed below.      Follow Up Recommendations SNF;Supervision/Assistance - 24 hour    Equipment Recommendations  None recommended by PT    Recommendations for Other Services       Precautions / Restrictions Precautions Precautions: Fall Restrictions Weight Bearing Restrictions: No      Mobility  Bed Mobility Overal bed mobility: Needs Assistance Bed Mobility: Supine to Sit     Supine to sit: Min assist;+2 for safety/equipment     General bed mobility comments: assist for LES and elevation of trunk  Transfers Overall transfer level: Needs assistance Equipment used: 2 person hand held assist Transfers: Sit to/from Stand Sit to Stand: Min assist;+2 safety/equipment         General transfer comment: Pt stood abruptly before PT was ready and needed steadying assist then sat abruptly.  C/o dizziness and wanted to lie down.  Had pt scoot to [facility] and then assist to lay down.   Ambulation/Gait                Stairs             Wheelchair Mobility    Modified Rankin (Stroke Patients Only)       Balance Overall balance assessment: Needs assistance Sitting-balance support: No upper extremity supported;Feet supported Sitting balance-Leahy Scale: Fair Sitting balance - Comments: tolerated sitting for 4 min EOB kicking LEs.    Standing balance support: Bilateral upper extremity supported;During functional activity Standing balance-Leahy Scale: Poor Standing balance comment: relies on UE support for balance and due to impulsivity.                              Pertinent Vitals/Pain Pain Assessment: No/denies pain    Home Living Family/patient expects to be discharged to:: Private residence Living Arrangements: Spouse/significant other;Children Available Help at Discharge: Family;Available 24 hours/day Type of Home: House Home Access: Level entry     Home Layout: One level Home Equipment: Cane - single point;Walker - 2 wheels;Walker - 4 wheels;Bedside commode;Shower seat;Wheelchair - manual      Prior Function Level of Independence: Independent         Comments: Per pt she did not use equipment PTA     Hand Dominance        Extremity/Trunk Assessment   Upper Extremity Assessment Upper Extremity Assessment: Defer to OT evaluation    Lower Extremity Assessment Lower Extremity Assessment: Generalized weakness    Cervical / Trunk Assessment Cervical / Trunk Assessment: Kyphotic  Communication   Communication: No difficulties  Cognition Arousal/Alertness: Awake/alert Behavior During Therapy: WFL for tasks assessed/performed Overall Cognitive Status: Impaired/Different from baseline Area of Impairment: Orientation;Safety/judgement;Awareness;Problem solving                 Orientation Level: Disoriented to;Time;Situation       Safety/Judgement: Decreased awareness of safety   Problem Solving: Difficulty sequencing;Requires verbal cues General Comments:  Pt slightly impulsive at times      General Comments      Exercises General Exercises - Lower Extremity Long Arc Quad: AROM;Both;10 reps;Seated   Assessment/Plan    PT Assessment Patient needs continued PT services  PT Problem List Decreased activity tolerance;Decreased balance;Decreased mobility;Decreased knowledge of use of DME;Decreased safety awareness;Decreased knowledge of precautions;Decreased cognition       PT Treatment Interventions DME instruction;Gait training;Functional mobility training;Therapeutic activities;Therapeutic exercise;Balance training;Patient/family education    PT Goals (Current goals can be found in the Care Plan section)  Acute Rehab PT Goals Patient Stated Goal: to get better PT Goal Formulation: With patient Time For Goal Achievement: 06/17/18 Potential to Achieve Goals: Good    Frequency Min 2X/week   Barriers to discharge        Co-evaluation               AM-PAC PT "6 Clicks" Mobility  Outcome Measure Help needed turning from your back to your side while in a flat bed without using bedrails?: A Little Help needed moving from lying on your back to sitting on the side of a flat bed without using bedrails?: A Little Help needed moving to and from a bed to a chair (including a wheelchair)?: A Little Help needed standing up from a chair using your arms (e.g., wheelchair or bedside chair)?: A Little Help needed to walk in hospital room?: Total Help needed climbing 3-5 steps with a railing? : Total 6 Click Score: 14    End of Session Equipment Utilized During Treatment: Gait belt;Oxygen(5LO2) Activity Tolerance: Patient limited by fatigue Patient left: in bed;with call bell/phone within reach;with bed alarm set;with nursing/sitter in room Nurse Communication: Mobility status PT Visit Diagnosis: Unsteadiness on feet (R26.81);Muscle weakness (generalized) (M62.81)    Time: 8527-7824 PT Time Calculation (min) (ACUTE ONLY): 12  min   Charges:   PT Evaluation $PT Eval Moderate Complexity: [place] Pager:  [phone]  Office:  [place] 06/03/2018, 1:00 PM

## 2018-06-03 NOTE — Progress Notes (Signed)
NAME:  Christine Ortega, MRN:  [id number], DOB:  [birth year], LOS: 15 ADMISSION DATE:  05/19/2018, CONSULTATION DATE: 2 15 2020 REFERRING MD: GI services, CHIEF COMPLAINT: GI bleed, pneumonia, blood loss anemia shock  Brief History   58 year old with UGI bleed intubated for endoscopy s/p IR embolization of gastroduodenal artery 2/16 for large bleeding gastric ulcer. She had bilateral infiltrates and 3 L oxygen requirement on admission, presumptively diagnosed as community-acquired pneumonia. Intubated. Ecoli UTI.   Past Medical History  Attention deficit disorder, Schizophrenia, Bipolar, History of chronic opioid abuse, History of tobacco abuse, Overuse of NSAIDs  Significant Hospital Events   05/19/2018 due to excessive GI bleeding transferred to intensive care unit with plan for endoscopy. 2/15 - Flu PCR - neg 2/16 >> change from Precedex to propofol; extubated. 2/18 increasing FIO2 needs. Failed BIPAP; reintubated.  2/19 still hypoxic. CXR w/ diffuse airspace disease. PPlats 38 so titrated down to 6 cc/kg PBW. CT obtained. Neg for PE. Was started on NMB.  2/20: off NMB. Still using high RASS goal of -3 to -4. FIO2 and peep down. Still on low dose pressors. Getting lasix.  2/21: FT placed. Diuresing  2/22 cont diuresis. Transition to precedex.  2/23 weaning on 12/5 PSV 2/26 weaning on 10/5 PSV, mental status remains barrier to extubation (is intermittently awake but not following commands) 2/27 weaning on 5/5, more alert  Consults:  05/19/2018 pulmonary critical care 05/19/2018 GI and IR  Procedures:  2/15 ETT >> 2/16 2/15 R femoral cordis >>2/18 -pt pulled out 2/15 left femoral aline >> 2/16 > embolization of GDA  Significant Diagnostic Tests:  05/19/2018 endoscopy>>  - Normal esophagus. - Red blood in the entire stomach with significant clot burden - as above time not taken to clear the entire stomach due to obvious pathology at the antrum. It is possible there is pathology in the  fundus / proximal stomach to cause bleeding but would not have been visualized on this exam. - Giant gastric ulcer at the antrum entering duodenal bulb - not amenable to endoscopic therapy given appearance and poor ability to visualize the area (of note, hemospray currently not available due to FDA recall). The procedure was aborted due to tenous status with need for more definitive therapy - Blood in the entire examined duodenum. This patient is having life threatening bleeding presumed due to giant gastric ulcer which did not appear amenable to endoscopic therapy. IR was called for attempt at embolization. 2/16 - Post mesenteric arteriogram and empiric embolization of the GDA.   2/18 reintubated. For on-going resp failure.  2/19: Chest x-ray fairly stable.  Increasing PEEP and FiO2 requirements out of proportion to chest x-ray changes.  CT chest ordered to rule out pulmonary emboli > negative.  Micro Data:  05/19/2018 blood cultures x2>> ng 05/19/2018 urine culture >>  >100k e.Coli >> sens to CTX 05/19/2018 sputum culture >> nl resp flora 2/15 RVP - neg 2/24 Blood Cx >> negative 2/24 Urine Cx >> negative 2/25 Resp Cx >>  Antimicrobials:  ceftriaxone 2/15 >> 2/23, 2/25 > Zithromax 2/15 >> 2/17 Vancomycin 2/15, 2/26 > 06/01/2018 on antimicrobials stopped  Interim history/subjective:  Stable overnight.  Went down today for swallow study.  Up in bed is much tolerated today.  Patient states she is feeling better.  Objective   Blood pressure 123/79, pulse 98, temperature 97.7 F (36.5 C), temperature source Oral, resp. rate (!) 23, height 5\' 3"  (1.6 m), weight 64.6 kg, SpO2 100 %.  Intake/Output Summary (Last 24 hours) at 06/03/2018 1452 Last data filed at 06/03/2018 1200 Gross per 24 hour  Intake 1476.31 ml  Output 1550 ml  Net -73.69 ml   Filed Weights   06/01/18 0438 06/02/18 0251 06/03/18 0324  Weight: 61.9 kg 57.6 kg 64.6 kg   General: Elderly chronically ill-appearing  female, resting in bed no apparent distress HENT: NCAT sclera clear, core track and nose PULM: Clear to auscultation bilaterally CV: Regular rate and rhythm S1-S2 no MRG GI: Soft nontender nondistended bowel sounds present MSK: Significant edema, some edema in the left upper extremity is much improved as compared to before Neuro: Alert, following commands no focal deficit very weak  Resolved Hospital Problem list   Hemorrhagic shock 2/2 UGIB: s/p 7 units PRBC, Hgb 4.1 >> 11 >> now stable 8.5 Sedation related hypotension  Assessment & Plan:   Persistent fevers within the ICU Right lower extremity DVT, left upper extremity DVT does have PICC line in place. Continue to observe H&H. - Heparin drip continued.  ARDS, resolved, likely related to transfusion related acute lung injury Hypoxemic respiratory failure requiring intubation mechanical ventilation Continues with good UOP with Lasix, net -500cc. Continues to wean, currently 5/5. CXR slightly worsened today from prior. Plan -Doing well post extubation. We will continue to follow. - Wean FiO2 as tolerated to maintain sats above 90  Ecoli UTI s/p abx Plan -Continue to monitor off antimicrobials, no additional fevers  Acute severe UGIB w/ profound acute blood loss anemia, stable   - found to have bleeding gastric ulcer 2/2 NSAID use s/p IR embolization of GDA  -GI following -h pylori was equivocal  -Remained stable Plan Continue PPI  History attention deficit disorder, bipolar, depression, schizophrenia, chronic pain Acute Encephalopathy/ Delirium, improving Plan -Continue small dose Depakote -small dose Prozac  QTc prolongation Plan QT prolonging drugs  Foot drop Plan Remains with foot drop boots in place Will need PT OT  Constipation Plan Continue bowel regimen  Mobility Plan We appreciate PT and OT evaluations and their recommendations  Best practice:  Diet: Tube feeding Pain/Anxiety/Delirium protocol  (if indicated): n/a VAP protocol (if indicated): yes DVT prophylaxis: heparin  GI prophylaxis: PPI bid Glucose control: SSI Mobility: Bedrest Code Status: Full Family Communication: updated daughter at bedside Disposition: ICU   Garner Nash, DO [facility] 06/03/2018 2:52 PM  Personal pager: [phone] If unanswered, please page CCM On-call: [phone]

## 2018-06-03 NOTE — Progress Notes (Signed)
Modified Barium Swallow Progress Note  Patient Details  Name: Christine Ortega MRN: [id number] Date of Birth: Apr 11, 1960  Today's Date: 06/03/2018  Modified Barium Swallow completed.  Full report located under Chart Review in the Imaging Section.  Brief recommendations include the following:  Clinical Impression  MBS was completed using thin liquids via spoon and cup, nectar thick liquids via spoon and cup, honey thick liquids via spoon and cup, pureed material and dual textured solids.  The patient presented with a mild oral and moderate to severe pharyngeal dysphagia.  The oral phase was marked by decreased lingual movement which led to delayed oral transit particularly given purees and dual textured solids.  Good lingual control was generally seen especially duing directed bolus hold, although, on several occassions mild premature loss of the bolus was seen.  The pharyngeal phase was marked by a swallow trigger generally in the pyriform sinuses or after material entered the laryngeal vestibule.  In addition, the following was also seen:  decreased anterior laryngeal movement, decreased laryngeal elevation, decreased laryngeal vestibule closure, decreased base of tongue retraction and decreased pharyngeal stripping wave.  Aspiration was seen during and prior to the swallow gven thin liquids, nectar thick liquids and honey thick liquids.  She was only noted to cough on the aspiration of thin liquids.  All other events were SILENT!  No penetration or aspiration was seen on purees or dual textured solids.  Esophageal sweep revealed it slow to clear.  Recommend begin a dysphagia 1 diet with NO liquids with FULL supervision/assist for all intake.  She should remain upright for at least 30 minutes after meals.  Allow ice chips between meals.  Oral care should be completed at minumum 3 times per day using a toothbrush and toothpaste.     Swallow Evaluation Recommendations       SLP Diet Recommendations:  Dysphagia 1 (Puree) solids;Pudding thick liquid   Liquid Administration via: Spoon   Medication Administration: Crushed with puree   Supervision: Full assist for feeding   Compensations: Slow rate;Small sips/bites;Follow solids with liquid   Postural Changes: Remain semi-upright after after feeds/meals (Comment);Seated upright at 90 degrees   Oral Care Recommendations: Oral care QID;Staff/trained caregiver to provide oral care   Other Recommendations: Have oral suction available   Shelly Flatten, MA, CCC-SLP Acute Rehab SLP [phone]  Lamar Sprinkles 06/03/2018,1:55 PM

## 2018-06-03 NOTE — Progress Notes (Signed)
Per ST, pt to be placed no pureed/Dys 1 Diet w/ pudding thick liquids.  Per ST, pt to remain on TF for now to supplement.

## 2018-06-03 NOTE — Evaluation (Signed)
Clinical/Bedside Swallow Evaluation Patient Details  Name: Christine Ortega MRN: [id number] Date of Birth: [birth year]  Today's Date: 06/03/2018 Time: SLP Start Time (ACUTE ONLY): 0805 SLP Stop Time (ACUTE ONLY): 0824 SLP Time Calculation (min) (ACUTE ONLY): 19 min  Past Medical History:  Past Medical History:  Diagnosis Date  . ADD (attention deficit disorder)   . Anxiety   . Bipolar disorder ([place])   . Chronic pain   . Depression   . Narcotic abuse in remission ([place])   . Schizophrenia ([place])   . Seizures ([place])    Past Surgical History:  Past Surgical History:  Procedure Laterality Date  . BACK SURGERY    . ESOPHAGOGASTRODUODENOSCOPY N/A 05/19/2018   Procedure: ESOPHAGOGASTRODUODENOSCOPY (EGD);  Surgeon: Yetta Flock, MD;  Location: [facility] ENDOSCOPY;  Service: Gastroenterology;  Laterality: N/A;  egd at bedside in ICU, intubated  . IR ANGIOGRAM SELECTIVE EACH ADDITIONAL VESSEL  05/20/2018  . IR ANGIOGRAM SELECTIVE EACH ADDITIONAL VESSEL  05/20/2018  . IR ANGIOGRAM VISCERAL SELECTIVE  05/20/2018  . IR ANGIOGRAM VISCERAL SELECTIVE  05/20/2018  . IR EMBO ART  VEN HEMORR LYMPH EXTRAV  INC GUIDE ROADMAPPING  05/20/2018  . IR US GUIDE VASC ACCESS LEFT  05/20/2018   HPI:  58 year old with UGI bleed intubated 2/15-2/17 for endoscopy s/p IR embolization of gastroduodenal artery for large bleeding gastric ulcer. She had bilateral infiltrates and 3 L oxygen requirement on admission, presumptively diagnosed as community-acquired pneumonia. Hx ADD, bipolar, depression, schizophrenia, chronic pain with hx use of NSAIDS.    Assessment / Plan / Recommendation Clinical Impression  Clinical swallowing evaluation was completed using ice chips, thin liquids via spoon and pureed material.  Of note, the patient was intubated x 2.  She was seen by ST following iniital intubation with recommendation that she remain NPO with ST following for PO readiness.  She was then re intubated and recently extubated  following an 11 day intubation.  Cranial nerve exam was completed and remarkable for lingual and jaw weakness as well as decreased lingual movement particularly to the left.   Vocal quality was aphonic with weak, breathy volitional cough seen.  The patient presented with a possible pharyngeal dysphagia.  Swallow trigger was appreciated to palpation and the patient was noted to be able to masticate the ice chips.  However, significant coughing was seen following thin lqiuids via spoon sips and multiple swallows were seen given pureed material.  Given her history and clinical presentation recommend that she remain NPO except ice chips with aggressive oral care pending results of MBS to determine current swallowing physiology.   SLP Visit Diagnosis: Dysphagia, unspecified (R13.10)    Aspiration Risk  Severe aspiration risk    Diet Recommendation   NPO except ice following oral care pending results of MBS.    Medication Administration: Via alternative means    Other  Recommendations Oral Care Recommendations: Oral care QID;Oral care prior to ice chip/H20 Other Recommendations: Have oral suction available   Follow up Recommendations Other (comment)(TBD)             Prognosis Prognosis for Safe Diet Advancement: Good      Swallow Study   General Date of Onset: 05/19/18 HPI: 58 year old with UGI bleed intubated 2/15-2/17 for endoscopy s/p IR embolization of gastroduodenal artery for large bleeding gastric ulcer. She had bilateral infiltrates and 3 L oxygen requirement on admission, presumptively diagnosed as community-acquired pneumonia. Hx ADD, bipolar, depression, schizophrenia, chronic pain with hx use of NSAIDS.  Type of  Study: Bedside Swallow Evaluation Previous Swallow Assessment: Clinical swallowing evaluation completed 05/21/2018 with recommendation for NPO. Diet Prior to this Study: NG Tube Temperature Spikes Noted: Yes History of Recent Intubation: Yes Length of Intubations (days):  13 days(Intubated x 2, first time for 2 days, second time for 11 day) Date extubated: 06/02/18 Behavior/Cognition: Alert Oral Cavity Assessment: Dry Oral Care Completed by SLP: No Oral Cavity - Dentition: Adequate natural dentition Patient Positioning: Upright in bed Baseline Vocal Quality: Aphonic Volitional Cough: Weak Volitional Swallow: Able to elicit    Oral/Motor/Sensory Function Overall Oral Motor/Sensory Function: Mild impairment Facial ROM: Within Functional Limits Facial Symmetry: Within Functional Limits Facial Strength: Within Functional Limits Lingual ROM: Reduced left Lingual Symmetry: Within Functional Limits Lingual Strength: Reduced Mandible: Impaired   Ice Chips Ice chips: Within functional limits Presentation: Spoon   Thin Liquid Thin Liquid: Impaired Presentation: Spoon Pharyngeal  Phase Impairments: Cough - Immediate    Nectar Thick Nectar Thick Liquid: Not tested   Honey Thick Honey Thick Liquid: Not tested   Puree Puree: Impaired Presentation: Spoon Pharyngeal Phase Impairments: Multiple swallows   Solid     Solid: Not tested      Shelly Flatten, MA, CCC-SLP Acute Rehab SLP [phone]  Lamar Sprinkles 06/03/2018,8:38 AM

## 2018-06-03 NOTE — Progress Notes (Signed)
This RN entered room to find pts PICC halfway out of arm. Notifed MD and removed PICC. Heparin paused as no other access.  Stat IV consult ordered

## 2018-06-04 LAB — CBC
HCT: 35.6 % — ABNORMAL LOW (ref 36.0–46.0)
HEMOGLOBIN: 10.7 g/dL — AB (ref 12.0–15.0)
MCH: 29.1 pg (ref 26.0–34.0)
MCHC: 30.1 g/dL (ref 30.0–36.0)
MCV: 96.7 fL (ref 80.0–100.0)
Platelets: 833 10*3/uL — ABNORMAL HIGH (ref 150–400)
RBC: 3.68 MIL/uL — ABNORMAL LOW (ref 3.87–5.11)
RDW: 17.2 % — ABNORMAL HIGH (ref 11.5–15.5)
WBC: 12.7 10*3/uL — ABNORMAL HIGH (ref 4.0–10.5)
nRBC: 0 % (ref 0.0–0.2)

## 2018-06-04 LAB — HEMOGLOBIN AND HEMATOCRIT, BLOOD
HCT: 38.2 % (ref 36.0–46.0)
HCT: 34.4 % — ABNORMAL LOW (ref 36.0–46.0)
HEMOGLOBIN: 10.5 g/dL — AB (ref 12.0–15.0)
Hemoglobin: 11.5 g/dL — ABNORMAL LOW (ref 12.0–15.0)

## 2018-06-04 LAB — GLUCOSE, CAPILLARY
GLUCOSE-CAPILLARY: 101 mg/dL — AB (ref 70–99)
Glucose-Capillary: 90 mg/dL (ref 70–99)
Glucose-Capillary: 102 mg/dL — ABNORMAL HIGH (ref 70–99)
Glucose-Capillary: 113 mg/dL — ABNORMAL HIGH (ref 70–99)
Glucose-Capillary: 113 mg/dL — ABNORMAL HIGH (ref 70–99)

## 2018-06-04 LAB — MAGNESIUM: Magnesium: 2 mg/dL (ref 1.7–2.4)

## 2018-06-04 LAB — HEPARIN LEVEL (UNFRACTIONATED): HEPARIN UNFRACTIONATED: 0.33 [IU]/mL (ref 0.30–0.70)

## 2018-06-04 LAB — PHOSPHORUS: Phosphorus: 3 mg/dL (ref 2.5–4.6)

## 2018-06-04 MED ORDER — OXYCODONE HCL 5 MG/5ML PO SOLN
5.0000 mg | ORAL | Status: DC | PRN
  Administered 2018-06-04 – 2018-06-05 (×4): 5 mg
  Filled 2018-06-04 (×4): qty 5

## 2018-06-04 MED ORDER — FENTANYL CITRATE (PF) 100 MCG/2ML IJ SOLN
25.0000 ug | INTRAMUSCULAR | Status: DC | PRN
  Administered 2018-06-05 – 2018-06-07 (×7): 25 ug via INTRAVENOUS
  Filled 2018-06-04 (×7): qty 2

## 2018-06-04 MED ORDER — ACETAMINOPHEN 160 MG/5ML PO SOLN
650.0000 mg | Freq: Four times a day (QID) | ORAL | Status: DC | PRN
  Administered 2018-06-04 – 2018-06-05 (×3): 650 mg
  Filled 2018-06-04 (×3): qty 20.3

## 2018-06-04 MED ORDER — FLUOXETINE HCL 20 MG/5ML PO SOLN
10.0000 mg | Freq: Every day | ORAL | Status: DC
  Administered 2018-06-05: 10 mg
  Filled 2018-06-04: qty 5

## 2018-06-04 MED ORDER — RESOURCE THICKENUP CLEAR PO POWD
ORAL | Status: DC | PRN
  Administered 2018-06-04: 12:00:00 via ORAL
  Filled 2018-06-04 (×2): qty 125

## 2018-06-04 NOTE — Progress Notes (Signed)
Occupational Therapy Evaluation Patient Details Name: Christine Ortega MRN: [id number] DOB: [birth year] Today's Date: 06/04/2018    History of Present Illness 58 year old with UGI bleed intubated 2/15-2/17 for endoscopy s/p IR embolization of gastroduodenal artery for large bleeding gastric ulcer. Pt reintubated 2/19-2/29.  She had bilateral infiltrates and 3 L oxygen requirement on admission, presumptively diagnosed as community-acquired pneumonia. Pt also with + DVT.  Hx ADD, bipolar, depression, schizophrenia, chronic pain with hx use of NSAIDS.     Clinical Impression   PTA, pt independent with ADL and mobility. Pt currently requires mod to Max A for ADL tasks and mod A + 2 for mobility. Pt declined OOB and limited participation with therapy. Will benefit form acute OT services to facilitate DC to SNF for rehab. Will follow acutely.     Follow Up Recommendations  SNF;Supervision - Intermittent    Equipment Recommendations  Other (comment)(TBA at SNF)    Recommendations for Other Services       Precautions / Restrictions Precautions Precautions: Fall Required Braces or Orthoses: Other Brace Other Brace: B Prafo boots Restrictions Weight Bearing Restrictions: No      Mobility Bed Mobility Overal bed mobility: Needs Assistance Bed Mobility: Supine to Sit;Sit to Supine     Supine to sit: Min assist Sit to supine: Min assist  Impulsive    Transfers Overall transfer level: Needs assistance   Transfers: Sit to/from Stand Sit to Stand: Min assist;+2 safety/equipment         General transfer comment: Stands with min A but requires up to Mod A to maintain standing due to poor endurance and and weakness; sits without warning;impulsive    Balance Overall balance assessment: Needs assistance   Sitting balance-Leahy Scale: Fair       Standing balance-Leahy Scale: Poor Standing balance comment: relies on UE support for balance and due to impulsivity.                             ADL either performed or assessed with clinical judgement   ADL Overall ADL's : Needs assistance/impaired Eating/Feeding: NPO   Grooming: Minimal assistance;Sitting   Upper Body Bathing: Minimal assistance;Sitting   Lower Body Bathing: Moderate assistance;Sit to/from stand   Upper Body Dressing : Moderate assistance;Sitting   Lower Body Dressing: Maximal assistance;Sit to/from stand       Toileting- Water quality scientist and Hygiene: Total assistance Toileting - Clothing Manipulation Details (indicate cue type and reason): foley; Max A to clean pericarea     Functional mobility during ADLs: +2 for physical assistance;Moderate assistance General ADL Comments: Pt stood EOB to clean periarea for short period (less than 1 min)     Vision   Additional Comments: will further assess     Perception     Praxis      Pertinent Vitals/Pain Pain Assessment: Faces Faces Pain Scale: Hurts even more Pain Location: general complaints of pain Pain Descriptors / Indicators: Discomfort;Grimacing;Guarding Pain Intervention(s): Limited activity within patient's tolerance;Patient requesting pain meds-RN notified     Hand Dominance Right   Extremity/Trunk Assessment Upper Extremity Assessment Upper Extremity Assessment: Generalized weakness   Lower Extremity Assessment Lower Extremity Assessment: Defer to PT evaluation   Cervical / Trunk Assessment Cervical / Trunk Assessment: Kyphotic   Communication Communication Communication: No difficulties   Cognition Arousal/Alertness: Awake/alert Behavior During Therapy: Restless;Flat affect;Anxious Overall Cognitive Status: No family/caregiver present to determine baseline cognitive functioning  Safety/Judgement: Decreased awareness of safety;Decreased awareness of deficits   Problem Solving: Slow processing General Comments: Most likely impaired cognition; also affected by  behavior; will further assess   General Comments       Exercises     Shoulder Instructions      Home Living Family/patient expects to be discharged to:: Skilled nursing facility                                        Prior Functioning/Environment Level of Independence: Independent        Comments: Per pt she did not use equipment PTA        OT Problem List: Decreased strength;Decreased range of motion;Decreased activity tolerance;Impaired balance (sitting and/or standing);Decreased cognition;Decreased safety awareness;Decreased knowledge of use of DME or AE;Cardiopulmonary status limiting activity;Pain      OT Treatment/Interventions: Self-care/ADL training;Therapeutic exercise;Energy conservation;DME and/or AE instruction;Therapeutic activities;Cognitive remediation/compensation;Patient/family education;Balance training    OT Goals(Current goals can be found in the care plan section) Acute Rehab OT Goals Patient Stated Goal: to be left alone OT Goal Formulation: With patient Time For Goal Achievement: 06/18/18 Potential to Achieve Goals: Good  OT Frequency: Min 2X/week   Barriers to D/C:            Co-evaluation              AM-PAC OT "6 Clicks" Daily Activity     Outcome Measure Help from another person eating meals?: Total Help from another person taking care of personal grooming?: A Little Help from another person toileting, which includes using toliet, bedpan, or urinal?: Total Help from another person bathing (including washing, rinsing, drying)?: A Lot Help from another person to put on and taking off regular upper body clothing?: A Little Help from another person to put on and taking off regular lower body clothing?: A Lot 6 Click Score: 12   End of Session Equipment Utilized During Treatment: Oxygen Nurse Communication: Mobility status;Other (comment)(chair position TID)  Activity Tolerance: Patient tolerated treatment  well Patient left: in bed;with bed alarm set;with call bell/phone within reach(chair position)  OT Visit Diagnosis: Unsteadiness on feet (R26.81);Other abnormalities of gait and mobility (R26.89);Muscle weakness (generalized) (M62.81);Other symptoms and signs involving cognitive function;Pain Pain - part of body: (generalized)                Time: 1135-1200 OT Time Calculation (min): 25 min Charges:  OT General Charges $OT Visit: 1 Visit OT Evaluation $OT Eval Moderate Complexity: 1 Mod OT Treatments $Self Care/Home Management : 8-22 mins  Maurie Boettcher, OT/L   Acute OT Clinical Specialist [place] Pager [phone] Office [phone]   [facility] 06/04/2018, 12:16 PM

## 2018-06-04 NOTE — Progress Notes (Addendum)
[place] TEAM 1 - Stepdown/ICU TEAM  Christine Ortega  TML:[id number] DOB: 1960/08/08 DOA: 05/19/2018 PCP: Default, Provider, MD    Brief Narrative:  58 year old with UGI bleed intubated for endoscopy s/p IR embolization of gastroduodenal artery 2/16 for large bleeding gastric ulcer. She had bilateral infiltrates and 3 L oxygen requirement on admission, presumptively diagnosed as community-acquired pneumonia. Intubated. Ecoli UTI.   Significant Events: 2/15 due to excessive GI bleeding transferred to intensive care unit with plan for endoscopy - intubated  2/15 EGD noting "giant gastric ulcer at the antrum and entering duodenal bulb - not amenable to endoscopic therapy" 2/16 mesenteric arteriogram and empiric embolization of the GDA 2/16 change from Precedex to propofol; extubated 2/18 increasing FIO2 needs. Failed BIPAP; reintubated.  2/19 still hypoxic. CXR w/ diffuse airspace disease. PPlats 38 so titrated down to 6 cc/kg PBW. CT obtained. Neg for PE. Was started on NMB.  2/20: off NMB. Still using high RASS goal of -3 to -4. FIO2 and peep down. Still on low dose pressors. Getting lasix.  2/21: FT placed. Diuresing  2/22 cont diuresis. Transition to precedex.  2/23 weaning on 12/5 PSV 2/26 weaning on 10/5 PSV, mental status remains barrier to extubation (is intermittently awake but not following commands) 2/27 weaning on 5/5, more alert 3/1 pulled out her own PICC  Subjective: Resting comfortably in bed.  No evidence of respiratory distress.  No uncontrolled pain.  No family present at time of exam.  Assessment & Plan:  Acute severe UGIB bleeding gastric ulcer 2/2 NSAID use - s/p IR embolization of GDA - H pylori was equivocal -no sign of ongoing bleeding at this time  Profound acute blood loss anemia, stable   Status post 6 units of packed red blood cells since admission -hemoglobin appears to be stable at this time even on IV heparin  Right lower extremity DVT, left upper  extremity DVT  Continue anticoagulation -monitoring for bleeding complications -stable at this time  ARDS - TRALI - Hypoxemic respiratory failure  Recovering with patient stable now on minimal O2 support  Acute Encephalopathy / Delirium Move out of ICU -PT/OT -avoid sedatives as able  E coli UTI  Has completed a course of abx tx   FUO ID has evaluated patient -currently off antibiotic therapy -appears to have stabilized with no recurrence of fevers -monitor  ADD, bipolar, depression, schizophrenia Cont depakote and prozac   QTc prolongation Monitor on telemetry  Foot drop foot drop boots in place  Constipation Continue bowel regimen  Nutrition  Minimal intake on mod diet - keep cortrak in place until intake signif improved    DVT prophylaxis: SCDs Code Status: FULL CODE Family Communication: no family present at time of exam  Disposition Plan: stable for tele bed   Consultants:  PCCM GI IR   Antimicrobials:  ceftriaxone 2/15 > 2/23, 2/25 > 2/28 Zithromax 2/15 > 2/17 Vancomycin 2/15, 2/26 > 2/28  Objective: Blood pressure 134/86, pulse 99, temperature (!) 97.5 F (36.4 C), temperature source Oral, resp. rate (!) 22, height 5\' 3"  (1.6 m), weight 55.6 kg, SpO2 100 %.  Intake/Output Summary (Last 24 hours) at 06/04/2018 1424 Last data filed at 06/04/2018 1000 Gross per 24 hour  Intake 1115.7 ml  Output 1075 ml  Net 40.7 ml   Filed Weights   06/02/18 0251 06/03/18 0324 06/04/18 0310  Weight: 57.6 kg 64.6 kg 55.6 kg    Examination: General: No acute respiratory distress Lungs: Clear to auscultation bilaterally without wheezes  or crackles Cardiovascular: Regular rate and rhythm without murmur gallop or rub normal S1 and S2 Abdomen: Nontender, nondistended, soft, bowel sounds positive, no rebound, no ascites, no appreciable mass Extremities: 1+ edema RLE and LUE - no edema LLE and RUE   CBC: Recent Labs  Lab 05/30/18 0329  06/02/18 0433  06/03/18 0408   06/04/18 0042 06/04/18 0427 06/04/18 1101  WBC 14.9*   < > 19.1*  --  14.5*  --   --  12.7*  --   NEUTROABS 9.7*  --   --   --   --   --   --   --   --   HGB 9.3*   < > 9.9*   < > 9.8*   < > 11.5* 10.7* 10.5*  HCT 32.1*   < > 33.6*   < > 33.2*   < > 38.2 35.6* 34.4*  MCV 99.4   < > 97.4  --  97.1  --   --  96.7  --   PLT 340   < > 642*  --  707*  --   --  833*  --    < > = values in this interval not displayed.   Basic Metabolic Panel: Recent Labs  Lab 05/31/18 0406  06/01/18 0747  06/02/18 0433  06/03/18 0408 06/03/18 1620 06/04/18 0427  NA 143  --  144  --  144  --   --   --   --   K 3.6  --  3.9  --  3.9  --   --   --   --   CL 97*  --  102  --  101  --   --   --   --   CO2 36*  --  34*  --  34*  --   --   --   --   GLUCOSE 129*  --  137*  --  143*  --   --   --   --   BUN 24*  --  26*  --  30*  --   --   --   --   CREATININE 0.61  --  0.59  --  0.57  --   --   --   --   CALCIUM 8.3*  --  8.7*  --  8.9  --   --   --   --   MG 2.3   < >  --    < > 2.2   < > 2.1 2.2 2.0  PHOS 2.8   < >  --    < > 3.4   < > 2.9 2.7 3.0   < > = values in this interval not displayed.   GFR: Estimated Creatinine Clearance: 64.2 mL/min (by C-G formula based on SCr of 0.57 mg/dL).  Liver Function Tests: No results for input(s): AST, ALT, ALKPHOS, BILITOT, PROT, ALBUMIN in the last 168 hours. No results for input(s): LIPASE, AMYLASE in the last 168 hours. No results for input(s): AMMONIA in the last 168 hours.  Coagulation Profile: No results for input(s): INR, PROTIME in the last 168 hours.   Recent Results (from the past 240 hour(s))  Culture, blood (routine x 2)     Status: None   Collection Time: 05/28/18 12:35 PM  Result Value Ref Range Status   Specimen Description BLOOD FOOT RIGHT  Final   Special Requests   Final    BOTTLES DRAWN AEROBIC ONLY  Blood Culture results may not be optimal due to an excessive volume of blood received in culture bottles   Culture   Final    NO GROWTH 5  DAYS Performed at [facility] [address], [place], [place] [address]    Report Status 06/02/2018 FINAL  Final  Culture, blood (routine x 2)     Status: None   Collection Time: 05/28/18 12:35 PM  Result Value Ref Range Status   Specimen Description BLOOD FOOT RIGHT  Final   Special Requests   Final    BOTTLES DRAWN AEROBIC ONLY Blood Culture results may not be optimal due to an excessive volume of blood received in culture bottles   Culture   Final    NO GROWTH 5 DAYS Performed at [facility], [place] [address], [place], [place] [address]    Report Status 06/02/2018 FINAL  Final  Culture, Urine     Status: None   Collection Time: 05/28/18  3:17 PM  Result Value Ref Range Status   Specimen Description URINE, RANDOM  Final   Special Requests NONE  Final   Culture   Final    NO GROWTH Performed at [facility], [place] [address], [place], [place] [address]    Report Status 05/29/2018 FINAL  Final  Culture, respiratory (non-expectorated)     Status: None   Collection Time: 05/29/18  1:54 PM  Result Value Ref Range Status   Specimen Description TRACHEAL ASPIRATE  Final   Special Requests NONE  Final   Gram Stain   Final    ABUNDANT WBC PRESENT, PREDOMINANTLY MONONUCLEAR RARE SQUAMOUS EPITHELIAL CELLS PRESENT FEW GRAM POSITIVE COCCI    Culture   Final    FEW Consistent with normal respiratory flora. Performed at [facility], [place] [address] [place], [place] [address]    Report Status 06/01/2018 FINAL  Final  Culture, respiratory (non-expectorated)     Status: None   Collection Time: 06/01/18 12:04 PM  Result Value Ref Range Status   Specimen Description TRACHEAL ASPIRATE  Final   Special Requests NONE  Final   Gram Stain NO WBC SEEN NO ORGANISMS SEEN   Final   Culture   Final    RARE Consistent with normal respiratory flora. Performed at [facility], [place] [address] [place], [place] [address]    Report Status 06/03/2018 FINAL  Final    Culture, Urine     Status: None   Collection Time: 06/01/18  2:26 PM  Result Value Ref Range Status   Specimen Description URINE, RANDOM  Final   Special Requests NONE  Final   Culture   Final    NO GROWTH Performed at [facility], [place] [address] [place], [place] [address]    Report Status 06/02/2018 FINAL  Final  Culture, blood (routine x 2)     Status: None (Preliminary result)   Collection Time: 06/01/18 11:08 PM  Result Value Ref Range Status   Specimen Description BLOOD RIGHT FOREARM  Final   Special Requests   Final    BOTTLES DRAWN AEROBIC ONLY Blood Culture adequate volume   Culture NO GROWTH 2 DAYS  Final   Report Status PENDING  Incomplete  Culture, blood (routine x 2)     Status: None (Preliminary result)   Collection Time: 06/01/18 11:15 PM  Result Value Ref Range Status   Specimen Description BLOOD RIGHT ARM  Final   Special Requests  Final    BOTTLES DRAWN AEROBIC ONLY Blood Culture adequate volume   Culture NO GROWTH 2 DAYS  Final   Report Status PENDING  Incomplete     Scheduled Meds: . chlorhexidine  15 mL Mouth Rinse BID  . Chlorhexidine Gluconate Cloth  6 each Topical Daily  . feeding supplement (PRO-STAT SUGAR FREE 64)  30 mL Per Tube Daily  . FLUoxetine  10 mg Oral Daily  . insulin aspart  0-9 Units Subcutaneous Q4H  . mouth rinse  15 mL Mouth Rinse q12n4p  . nystatin cream   Topical TID  . pantoprazole sodium  40 mg Per Tube BID  . potassium chloride  40 mEq Per Tube Daily  . sodium chloride flush  10-40 mL Intracatheter Q12H  . valproic acid  125 mg Per Tube Daily   Continuous Infusions: . sodium chloride 10 mL/hr at 05/29/18 2000  . feeding supplement (VITAL AF 1.2 CAL) 55 mL/hr at 06/04/18 0653  . heparin 1,350 Units/hr (06/04/18 1001)     LOS: 16 days   Cherene Altes, MD [facility] Office  [phone] Pager - Text Page per Amion  If 7PM-7AM, please contact night-coverage per Amion 06/04/2018, 2:24 PM

## 2018-06-04 NOTE — Progress Notes (Signed)
Patient transferred from 67m to 5w 13. Alert and oriented x 4. Vital AF 1.2 running at 12ml/hr. Heparin gtt continued at 13.5 units/hr. Placed on telemetry box 5W-17 and is running sinus tach. Bed alarm on and audible. Call bell within reach. Agree with previous nurse's assessment. Will continue to monitor patient.  Hiram Comber, RN 06/04/2018 4:59 PM

## 2018-06-04 NOTE — Progress Notes (Signed)
ANTICOAGULATION CONSULT NOTE  Pharmacy Consult for Heparin  Indication: DVT  Patient Measurements: Height: 5\' 3"  (160 cm) Weight: 122 lb 9.2 oz (55.6 kg) IBW/kg (Calculated) : 52.4  Vital Signs: Temp: 96.8 F (36 C) (03/02 0700) Temp Source: Axillary (03/02 0700) BP: 136/88 (03/02 0800) Pulse Rate: 107 (03/02 0800)  Labs: Recent Labs    06/02/18 0433  06/03/18 0408 06/03/18 0410  06/03/18 1620 06/04/18 0042 06/04/18 0427 06/04/18 0642  HGB 9.9*   < > 9.8*  --    < > 9.7* 11.5* 10.7*  --   HCT 33.6*   < > 33.2*  --    < > 32.8* 38.2 35.6*  --   PLT 642*  --  707*  --   --   --   --  833*  --   HEPARINUNFRC  --    < >  --  0.41  --   --   --  >2.20* 0.33  CREATININE 0.57  --   --   --   --   --   --   --   --    < > = values in this interval not displayed.     Assessment: 58 yo F with DVT in the upper and lower extremity. Pharmacy consulted to dose heparin (no bolus). She is noted with recent GIB s/p embolization of giant gastric ulcer on 2/16.   Heparin level is therapeutic; no bleeding reported.  Goal of Therapy:  Heparin level 0.3-0.7 units/ml Monitor platelets by anticoagulation protocol: Yes   Plan:  Increase heparin gtt slightly to 1350 units/hr Daily heparin level and CBC  Christine Ortega, PharmD, BCPS, [place] 06/04/2018, 9:56 AM

## 2018-06-04 NOTE — Progress Notes (Signed)
SLP Cancellation Note  Patient Details Name: Christine Ortega MRN: [id number] DOB: 12/22/1960   Cancelled treatment:       Reason Eval/Treat Not Completed: Patient at procedure or test/unavailable(Nursing performing care)   Wanza Szumski 06/04/2018, 3:01 PM

## 2018-06-05 DIAGNOSIS — K625 Hemorrhage of anus and rectum: Secondary | ICD-10-CM

## 2018-06-05 LAB — GLUCOSE, CAPILLARY
GLUCOSE-CAPILLARY: 92 mg/dL (ref 70–99)
GLUCOSE-CAPILLARY: 101 mg/dL — AB (ref 70–99)
Glucose-Capillary: 114 mg/dL — ABNORMAL HIGH (ref 70–99)
Glucose-Capillary: 121 mg/dL — ABNORMAL HIGH (ref 70–99)
Glucose-Capillary: 84 mg/dL (ref 70–99)
Glucose-Capillary: 92 mg/dL (ref 70–99)

## 2018-06-05 LAB — COMPREHENSIVE METABOLIC PANEL
ALBUMIN: 2.7 g/dL — AB (ref 3.5–5.0)
ALT: 31 U/L (ref 0–44)
AST: 35 U/L (ref 15–41)
Alkaline Phosphatase: 90 U/L (ref 38–126)
Anion gap: 7 (ref 5–15)
BILIRUBIN TOTAL: 0.4 mg/dL (ref 0.3–1.2)
BUN: 23 mg/dL — ABNORMAL HIGH (ref 6–20)
CHLORIDE: 100 mmol/L (ref 98–111)
CO2: 30 mmol/L (ref 22–32)
Calcium: 9.2 mg/dL (ref 8.9–10.3)
Creatinine, Ser: 0.54 mg/dL (ref 0.44–1.00)
GFR calc Af Amer: >60 mL/min (ref >60)
Glucose, Bld: 102 mg/dL — ABNORMAL HIGH (ref 70–99)
Potassium: 4.1 mmol/L (ref 3.5–5.1)
Sodium: 137 mmol/L (ref 135–145)
Total Protein: 6.6 g/dL (ref 6.5–8.1)

## 2018-06-05 LAB — CBC
HEMATOCRIT: 36.1 % (ref 36.0–46.0)
Hemoglobin: 11 g/dL — ABNORMAL LOW (ref 12.0–15.0)
MCH: 29.1 pg (ref 26.0–34.0)
MCHC: 30.5 g/dL (ref 30.0–36.0)
MCV: 95.5 fL (ref 80.0–100.0)
Platelets: UNDETERMINED 10*3/uL (ref 150–400)
RBC: 3.78 MIL/uL — ABNORMAL LOW (ref 3.87–5.11)
RDW: 17.2 % — ABNORMAL HIGH (ref 11.5–15.5)
WBC: 12.5 10*3/uL — ABNORMAL HIGH (ref 4.0–10.5)
nRBC: 0 % (ref 0.0–0.2)

## 2018-06-05 LAB — HEPARIN LEVEL (UNFRACTIONATED)
Heparin Unfractionated: <0.1 IU/mL — ABNORMAL LOW (ref 0.30–0.70)
Heparin Unfractionated: 0.29 IU/mL — ABNORMAL LOW (ref 0.30–0.70)

## 2018-06-05 MED ORDER — PRO-STAT SUGAR FREE PO LIQD
30.0000 mL | Freq: Three times a day (TID) | ORAL | Status: DC
  Administered 2018-06-05: 30 mL via ORAL
  Filled 2018-06-05: qty 30

## 2018-06-05 MED ORDER — BOOST PLUS PO LIQD
237.0000 mL | Freq: Three times a day (TID) | ORAL | Status: DC
  Administered 2018-06-05 – 2018-06-06 (×5): 237 mL via ORAL
  Filled 2018-06-05 (×11): qty 237

## 2018-06-05 MED ORDER — PANTOPRAZOLE SODIUM 40 MG PO TBEC
40.0000 mg | DELAYED_RELEASE_TABLET | Freq: Two times a day (BID) | ORAL | Status: DC
  Administered 2018-06-05 – 2018-06-08 (×6): 40 mg via ORAL
  Filled 2018-06-05 (×7): qty 1

## 2018-06-05 MED ORDER — OXYCODONE HCL 5 MG/5ML PO SOLN
5.0000 mg | Freq: Four times a day (QID) | ORAL | Status: DC | PRN
  Administered 2018-06-05: 5 mg
  Filled 2018-06-05: qty 5

## 2018-06-05 NOTE — Progress Notes (Addendum)
Nutrition Follow-up  DOCUMENTATION CODES:   Not applicable  INTERVENTION:  - Magic cup TID with meals, each supplement provides 290 kcal and 9 grams of protein (Orange) - Boost plus TID with meals, each supplement provides 360 kcal and 14 grams of protein. (Thickened to pudding thick consistency prior to administration)  - Once liquid advances to thin liquids, begin Ensure Enlive po BID, each supplement provides 350 kcal and 20 grams of protein (chocolate)  NUTRITION DIAGNOSIS:   Inadequate oral intake related to inability to eat as evidenced by NPO status.  Progressing, pt is now on Dysphagia 1/pudding thick  GOAL:   Patient will meet greater than or equal to 90% of their needs  Progressing  MONITOR:   PO intake, Supplement acceptance, Diet advancement  ASSESSMENT:   58 year old female with a PMH of narcotic abuse, hypokalemia, anxiety, and bipolar disorder. Transferred from [facility] 05/19/2018 after developing a GI Bleed blood loss anemia. EGD performed and revealed giant gastric ulcer at the antrum entering the duodenal bulb.  2/29 Extubated. 3/3 Cortrak removed.   Currently pt has no ongoing GI bleed. SLP is following for diet advancement; SLP recommended FEES. Pt reported that she has not had anything PO in the last 2 days. TF was d/c this afternoon.   Spoke with patient at bedside. Pt reports that PTA she was eating 2 meals a day, but had a poor appetite. She mentioned that she was feeling poorly before she came to the hospital which made her not want to fix food or eat. The only foods she reported she enjoyed eating when she was sick was pickles with lettuce. Upon further inquiries pt reported that she would eat things such as frozen meals or quick meals (soups, sandwiches).   Pt was unable to recall a UBW. Pt is currently at 127# which is a 14% wt loss in 2 weeks which is significant for the time frame. Suspect some wt loss is due to diuresis. Will continue  to follow for PO intake and wt gain.  Previously TF regimen was Vital AF 1.2 @ 37ml/hr with 27ml Prostat. Tube feed regimen would provide 1540 kcal, 105g protein, and 910ml free water.  Medications reviewed and include: insulin aspart 0-9 units q 4hr, protonix 40mg  2x Labs reviewed: CBG (42,35,36)    Diet Order:   Diet Order            DIET - DYS 1 Room service appropriate? Yes; Fluid consistency: Pudding Thick  Diet effective now              EDUCATION NEEDS:   Education needs have been addressed  Skin:  Skin Assessment: Reviewed RN Assessment  Last BM:  3/2  Height:   Ht Readings from Last 1 Encounters:  05/19/18 5\' 3"  (1.6 m)    Weight:   Wt Readings from Last 1 Encounters:  06/05/18 57.6 kg    Ideal Body Weight:  52.27 kg  BMI:  Body mass index is 22.49 kg/m.  Estimated Nutritional Needs:   Kcal:  1500-1700 kcal   Protein:  75-85 g   Fluid:  >/= 1.5L    [employer] Dietetic Intern

## 2018-06-05 NOTE — Progress Notes (Signed)
Physical Therapy Treatment Patient Details Name: Christine Ortega MRN: [id number] DOB: 18-Mar-1961 Today's Date: 06/05/2018    History of Present Illness 58 year old with UGI bleed intubated 2/15-2/17 for endoscopy s/p IR embolization of gastroduodenal artery for large bleeding gastric ulcer. Pt reintubated 2/19-2/29.  She had bilateral infiltrates and 3 L oxygen requirement on admission, presumptively diagnosed as community-acquired pneumonia. Pt also with + DVT.  Hx ADD, bipolar, depression, schizophrenia, chronic pain with hx use of NSAIDS.      PT Comments    Pt presented in bed c/o 9/10 low back pain. Pt initially refused therapy stating that her husband said she was fine and she planned to just rehab at home. After max encouragement was given pt stated she would not do anything therapy related until she was full of her pain meds. After further education and encouragement provided the pt agreed to transfer to recliner chair. Pt tolerated transfer to recliner chair with min A. Pt left with all needs, call bell/phone, all other needs within reach. Pt displays self limiting behavior that limits progress of therapy at this time. Plan to progress gait training as pt becomes more agreeable. Pt educated on benefits of movement to decrease pain and improve function. D/c plan to SNF is appropriate based on current status.    Follow Up Recommendations  SNF;Supervision/Assistance - 24 hour     Equipment Recommendations  None recommended by PT    Recommendations for Other Services       Precautions / Restrictions Precautions Precautions: Fall Required Braces or Orthoses: Other Brace Other Brace: B Prafo boots Restrictions Weight Bearing Restrictions: No    Mobility  Bed Mobility Overal bed mobility: Needs Assistance Bed Mobility: Supine to Sit     Supine to sit: Min assist     General bed mobility comments: assist for LES and elevation of trunk  Transfers Overall transfer level: Needs  assistance Equipment used: Rolling walker (2 wheeled);2 person hand held assist(2 person HHA for pivot transfer, RW used for standing trial) Transfers: Sit to/from Stand Sit to Stand: +2 physical assistance;Mod assist;Min assist         General transfer comment: Pt required 2 person HHA for stand pivot transfer with no AD, and pt was min +1 w/ RW when standing up from recliner to have BM cleaned off posterior.   Ambulation/Gait             General Gait Details: refused   Stairs             Wheelchair Mobility    Modified Rankin (Stroke Patients Only)       Balance Overall balance assessment: Needs assistance Sitting-balance support: No upper extremity supported;Feet unsupported;Feet supported Sitting balance-Leahy Scale: Fair Sitting balance - Comments: briefly tolerated sitting EOB leading up to transfer with feet off ground and arms not in contact with EOB   Standing balance support: Bilateral upper extremity supported;During functional activity Standing balance-Leahy Scale: Poor Standing balance comment: relies on UE support for balance and due to impulsivity.                             Cognition Arousal/Alertness: Awake/alert Behavior During Therapy: Restless;Flat affect;Anxious;Agitated Overall Cognitive Status: No family/caregiver present to determine baseline cognitive functioning Area of Impairment: Orientation;Safety/judgement;Awareness;Problem solving                 Orientation Level: Disoriented to;Time;Situation       Safety/Judgement: Decreased awareness  of safety;Decreased awareness of deficits   Problem Solving: Slow processing General Comments: Most likely impaired cognition; also affected by behavior; will further assess.      Exercises      General Comments        Pertinent Vitals/Pain Pain Assessment: 0-10 Pain Score: 9  Pain Location: general complaints of pain in lumbar region Pain Descriptors /  Indicators: Discomfort;Grimacing;Guarding    Home Living Family/patient expects to be discharged to:: Skilled nursing facility Living Arrangements: Spouse/significant other                  Prior Function            PT Goals (current goals can now be found in the care plan section) Acute Rehab PT Goals Patient Stated Goal: to be left alone PT Goal Formulation: With patient Potential to Achieve Goals: Good    Frequency    Min 2X/week      PT Plan Current plan remains appropriate    Co-evaluation              AM-PAC PT "6 Clicks" Mobility   Outcome Measure  Help needed turning from your back to your side while in a flat bed without using bedrails?: A Little Help needed moving from lying on your back to sitting on the side of a flat bed without using bedrails?: A Little Help needed moving to and from a bed to a chair (including a wheelchair)?: A Little Help needed standing up from a chair using your arms (e.g., wheelchair or bedside chair)?: A Little Help needed to walk in hospital room?: Total Help needed climbing 3-5 steps with a railing? : Total 6 Click Score: 14    End of Session Equipment Utilized During Treatment: Gait belt;Oxygen Activity Tolerance: Patient limited by fatigue;Patient limited by pain;Patient limited by lethargy(pt with self limiting behavior) Patient left: in chair;with chair alarm set;with call bell/phone within reach(on 4LPM) Nurse Communication: Mobility status PT Visit Diagnosis: Unsteadiness on feet (R26.81);Muscle weakness (generalized) (M62.81)     Time: 0277-4128 PT Time Calculation (min) (ACUTE ONLY): 28 min  Charges:  $Therapeutic Activity: 23-37 mins                     Maryelizabeth Kaufmann, SPTA   Maryelizabeth Kaufmann 06/05/2018, 5:17 PM

## 2018-06-05 NOTE — Progress Notes (Signed)
Pt's daughter, April, asked to be contacted by the MD in the AM to speak about pt's condition and plan of care. Pt's contact information in chart. Will remind day shift RN in hand off.

## 2018-06-05 NOTE — Progress Notes (Signed)
Spoke with Colletta Maryland RD and asked if it is ok for nurses to pull out coretrack. Per RD. Snip one side of the bridle and ok to pull out.

## 2018-06-05 NOTE — Progress Notes (Signed)
  Speech Language Pathology Treatment: Dysphagia  Patient Details Name: Christine Ortega MRN: [id number] DOB: 1960/09/11 Today's Date: 06/05/2018 Time: 1240-1300 SLP Time Calculation (min) (ACUTE ONLY): 20 min  Assessment / Plan / Recommendation Clinical Impression  F/u after 3/1 MBS.  Pt is alert, communicative; cortrak has been removed.  Voice is hoarse, but she is achieving phonation as compared to two days ago, when she was aphonic s/p 11-day intubation.  Pt demonstrates active mastication of purees, sits upright and follows instructions to slow rate/decrease bolus size.  Given improvements in MS and phonation, pt is likely ready for a repeat swallow study to determine improvements.  Christine Ortega of deficits (silent aspiration, posterior aspiration through interarytenoid space), dysphagia is likely related to prolonged oral intubation.  Recommend FEES to evaluate integrity of vocal folds.   Continue conservative diet - dysphagia 1/pudding thick liquids- pending study.     HPI HPI: 58 year old with UGI bleed intubated 2/15-2/17 for endoscopy s/p IR embolization of gastroduodenal artery for large bleeding gastric ulcer. Reintubated 2/18-2/29. She had bilateral infiltrates and 3 L oxygen requirement on admission, presumptively diagnosed as community-acquired pneumonia. Hx ADD, bipolar, depression, schizophrenia, chronic pain with hx use of NSAIDS.       SLP Plan  Continue with current plan of care       Recommendations  Diet recommendations: Dysphagia 1 (puree);Pudding-thick liquid Liquids provided via: Teaspoon Medication Administration: Crushed with puree Supervision: Patient able to self feed Compensations: Slow rate Postural Changes and/or Swallow Maneuvers: Seated upright 90 degrees                Oral Care Recommendations: Oral care QID;Oral care prior to ice chip/H20 Follow up Recommendations: Other (comment)(tba) SLP Visit Diagnosis: Dysphagia, oropharyngeal phase (R13.12) Plan:  Continue with current plan of care       [place]. Christine Ortega, [place] Office number [phone] Pager [phone]  Christine Ortega Christine Ortega 06/05/2018, 1:16 PM

## 2018-06-05 NOTE — Care Management Important Message (Signed)
Important Message  Patient Details  Name: Christine Ortega MRN: [id number] Date of Birth: [birth year]   Medicare Important Message Given:  Yes    Eustacio Ellen Montine Circle 06/05/2018, 3:49 PM

## 2018-06-05 NOTE — Progress Notes (Signed)
[facility] TEAM 1 - Stepdown/ICU TEAM  Christine Ortega  RVU:[id number] DOB: [birth year] DOA: 05/19/2018 PCP: Default, Provider, MD    Brief Narrative:  58 year old with UGI bleed intubated for endoscopy s/p IR embolization of gastroduodenal artery 2/16 for large bleeding gastric ulcer. She had bilateral infiltrates and 3 L oxygen requirement on admission, presumptively diagnosed as community-acquired pneumonia. Intubated. Ecoli UTI.   Significant Events: 2/15 due to excessive GI bleeding transferred to intensive care unit with plan for endoscopy - intubated  2/15 EGD noting "giant gastric ulcer at the antrum and entering duodenal bulb - not amenable to endoscopic therapy" 2/16 mesenteric arteriogram and empiric embolization of the GDA 2/16 change from Precedex to propofol; extubated 2/18 increasing FIO2 needs. Failed BIPAP; reintubated.  2/19 still hypoxic. CXR w/ diffuse airspace disease. PPlats 38 so titrated down to 6 cc/kg PBW. CT obtained. Neg for PE. Was started on NMB.  2/20: off NMB. Still using high RASS goal of -3 to -4. FIO2 and peep down. Still on low dose pressors. Getting lasix.  2/21: FT placed. Diuresing  2/22 cont diuresis. Transition to precedex.  2/23 weaning on 12/5 PSV 2/26 weaning on 10/5 PSV, mental status remains barrier to extubation (is intermittently awake but not following commands) 2/27 weaning on 5/5, more alert 3/1 pulled out her own PICC  Subjective:  Patient in bed, appears comfortable, denies any headache, no fever, no chest pain or pressure, no shortness of breath , no abdominal pain. No focal weakness.  Assessment & Plan:  Acute severe UGIB with profound acute blood loss related anemia.  Seen by GI, EGD showed bleeding gastric ulcer likely caused by NSAID use, status post embolization of GDA by IR.  Currently no ongoing bleed, in the past received 6 unit of packed RBC in ICU.  Continue PPI and monitor H&H.  Will require outpatient GI follow-up post  discharge as well.  H. pylori test was equivocal.  Profound acute blood loss anemia, stable  -  Status post 6 units of packed red blood cells since admission -hemoglobin appears to be stable at this time even on IV heparin  Right lower extremity DVT, left upper extremity DVT  - currently on heparin drip monitor.  ARDS - TRALI - Hypoxemic respiratory failure, Recovering with patient stable now on minimal O2 support.  Encouraged to increase activity, sit up in chair, flutter valve & I-S added.  Will monitor clinically.  Acute Encephalopathy / Delirium - Move out of ICU -PT/OT -avoid sedatives as able. Improved.  E coli UTI - Has completed a course of abx tx.  FUO - ID has evaluated patient -currently off antibiotic therapy, stable for now will monitor clinically.  ADD, bipolar, depression, schizophrenia - Cont depakote and prozac   QTc prolongation - resolved last QTC is 395 ms.  Foot drop - foot drop boots in place, PT, placement.  Constipation - Continue bowel regimen  Nutrition  -currently on dysphagia 1 diet, transition to oral diet, discontinue NG tube and tube feeds, protein supplements ordered.  Speech following.       DVT prophylaxis: SCDs Code Status: FULL CODE Family Communication: no family present at time of exam  Disposition Plan: Med  Consultants:  PCCM GI IR   Antimicrobials:  ceftriaxone 2/15 > 2/23, 2/25 > 2/28 Zithromax 2/15 > 2/17 Vancomycin 2/15, 2/26 > 2/28  Objective: Blood pressure 113/83, pulse 92, temperature 97.8 F (36.6 C), temperature source Oral, resp. rate 20, height 5\' 3"  (1.6 m), weight 57.6  kg, SpO2 100 %.  Intake/Output Summary (Last 24 hours) at 06/05/2018 1036 Last data filed at 06/05/2018 0300 Gross per 24 hour  Intake 396.02 ml  Output 735 ml  Net -338.98 ml   Filed Weights   06/03/18 0324 06/04/18 0310 06/05/18 0636  Weight: 64.6 kg 55.6 kg 57.6 kg    Examination:  Awake Alert, Oriented X 3, No new F.N deficits, NG tube,  trace edema, flat affect [place].AT,PERRAL Supple Neck,No JVD, No cervical lymphadenopathy appriciated.  Symmetrical Chest wall movement, Good air movement bilaterally, CTAB RRR,No Gallops, Rubs or new Murmurs, No Parasternal Heave +ve B.Sounds, Abd Soft, No tenderness, No organomegaly appriciated, No rebound - guarding or rigidity. No Cyanosis, Clubbing,  No new Rash or bruise   CBC: Recent Labs  Lab 05/30/18 0329  06/03/18 0408  06/04/18 0427 06/04/18 1101 06/05/18 0600  WBC 14.9*   < > 14.5*  --  12.7*  --  12.5*  NEUTROABS 9.7*  --   --   --   --   --   --   HGB 9.3*   < > 9.8*   < > 10.7* 10.5* 11.0*  HCT 32.1*   < > 33.2*   < > 35.6* 34.4* 36.1  MCV 99.4   < > 97.1  --  96.7  --  95.5  PLT 340   < > 707*  --  833*  --  245   < > = values in this interval not displayed.   Basic Metabolic Panel: Recent Labs  Lab 06/01/18 0747  06/02/18 0433  06/03/18 0408 06/03/18 1620 06/04/18 0427 06/05/18 0600  NA 144  --  144  --   --   --   --  137  K 3.9  --  3.9  --   --   --   --  4.1  CL 102  --  101  --   --   --   --  100  CO2 34*  --  34*  --   --   --   --  30  GLUCOSE 137*  --  143*  --   --   --   --  102*  BUN 26*  --  30*  --   --   --   --  23*  CREATININE 0.59  --  0.57  --   --   --   --  0.54  CALCIUM 8.7*  --  8.9  --   --   --   --  9.2  MG  --    < > 2.2   < > 2.1 2.2 2.0  --   PHOS  --    < > 3.4   < > 2.9 2.7 3.0  --    < > = values in this interval not displayed.   GFR: Estimated Creatinine Clearance: 64.2 mL/min (by C-G formula based on SCr of 0.54 mg/dL).  Liver Function Tests: Recent Labs  Lab 06/05/18 0600  AST 35  ALT 31  ALKPHOS 90  BILITOT 0.4  PROT 6.6  ALBUMIN 2.7*   No results for input(s): LIPASE, AMYLASE in the last 168 hours. No results for input(s): AMMONIA in the last 168 hours.  Coagulation Profile: No results for input(s): INR, PROTIME in the last 168 hours.   Recent Results (from the past 240 hour(s))  Culture, blood  (routine x 2)     Status: None   Collection Time: 05/28/18 12:35  PM  Result Value Ref Range Status   Specimen Description BLOOD FOOT RIGHT  Final   Special Requests   Final    BOTTLES DRAWN AEROBIC ONLY Blood Culture results may not be optimal due to an excessive volume of blood received in culture bottles   Culture   Final    NO GROWTH 5 DAYS Performed at [facility] [address] [place], [place] [address]    Report Status 06/02/2018 FINAL  Final  Culture, blood (routine x 2)     Status: None   Collection Time: 05/28/18 12:35 PM  Result Value Ref Range Status   Specimen Description BLOOD FOOT RIGHT  Final   Special Requests   Final    BOTTLES DRAWN AEROBIC ONLY Blood Culture results may not be optimal due to an excessive volume of blood received in culture bottles   Culture   Final    NO GROWTH 5 DAYS Performed at [facility], [place] [address] [place], [place] [address]    Report Status 06/02/2018 FINAL  Final  Culture, Urine     Status: None   Collection Time: 05/28/18  3:17 PM  Result Value Ref Range Status   Specimen Description URINE, RANDOM  Final   Special Requests NONE  Final   Culture   Final    NO GROWTH Performed at [facility], [place] [address], [place], [place] [address]    Report Status 05/29/2018 FINAL  Final  Culture, respiratory (non-expectorated)     Status: None   Collection Time: 05/29/18  1:54 PM  Result Value Ref Range Status   Specimen Description TRACHEAL ASPIRATE  Final   Special Requests NONE  Final   Gram Stain   Final    ABUNDANT WBC PRESENT, PREDOMINANTLY MONONUCLEAR RARE SQUAMOUS EPITHELIAL CELLS PRESENT FEW GRAM POSITIVE COCCI    Culture   Final    FEW Consistent with normal respiratory flora. Performed at [facility], [place] [address] [place], [place] [address]    Report Status 06/01/2018 FINAL  Final  Culture, respiratory (non-expectorated)     Status: None   Collection Time: 06/01/18 12:04 PM  Result Value  Ref Range Status   Specimen Description TRACHEAL ASPIRATE  Final   Special Requests NONE  Final   Gram Stain NO WBC SEEN NO ORGANISMS SEEN   Final   Culture   Final    RARE Consistent with normal respiratory flora. Performed at [facility], [place] [address] [place], [place] [address]    Report Status 06/03/2018 FINAL  Final  Culture, Urine     Status: None   Collection Time: 06/01/18  2:26 PM  Result Value Ref Range Status   Specimen Description URINE, RANDOM  Final   Special Requests NONE  Final   Culture   Final    NO GROWTH Performed at [facility], [place] [address] [place], [place] [address]    Report Status 06/02/2018 FINAL  Final  Culture, blood (routine x 2)     Status: None (Preliminary result)   Collection Time: 06/01/18 11:08 PM  Result Value Ref Range Status   Specimen Description BLOOD RIGHT FOREARM  Final   Special Requests   Final    BOTTLES DRAWN AEROBIC ONLY Blood Culture adequate volume   Culture NO GROWTH 2 DAYS  Final   Report Status PENDING  Incomplete  Culture, blood (routine x 2)     Status: None (Preliminary result)  Collection Time: 06/01/18 11:15 PM  Result Value Ref Range Status   Specimen Description BLOOD RIGHT ARM  Final   Special Requests   Final    BOTTLES DRAWN AEROBIC ONLY Blood Culture adequate volume   Culture NO GROWTH 2 DAYS  Final   Report Status PENDING  Incomplete     Scheduled Meds: . chlorhexidine  15 mL Mouth Rinse BID  . feeding supplement (PRO-STAT SUGAR FREE 64)  30 mL Oral TID WC  . FLUoxetine  10 mg Per Tube Daily  . insulin aspart  0-9 Units Subcutaneous Q4H  . lactose free nutrition  237 mL Oral TID WC  . mouth rinse  15 mL Mouth Rinse q12n4p  . nystatin cream   Topical TID  . pantoprazole sodium  40 mg Per Tube BID  . sodium chloride flush  10-40 mL Intracatheter Q12H  . valproic acid  125 mg Per Tube Daily   Continuous Infusions: . sodium chloride 10 mL/hr at 05/29/18 2000  . heparin 1,350 Units/hr  (06/05/18 0019)     LOS: 17 days   Signature  Lala Lund M.D on 06/05/2018 at 10:37 AM   -  To page go to [email or web address]

## 2018-06-05 NOTE — Progress Notes (Signed)
ANTICOAGULATION CONSULT NOTE  Pharmacy Consult for Heparin  Indication: DVT  Patient Measurements: Height: 5\' 3"  (160 cm) Weight: 126 lb 15.8 oz (57.6 kg) IBW/kg (Calculated) : 52.4  Vital Signs:    Labs: Recent Labs    06/03/18 0408  06/04/18 0427 06/04/18 0642 06/04/18 1101 06/05/18 0600 06/05/18 0606 06/05/18 1008  HGB 9.8*   < > 10.7*  --  10.5* 11.0*  --   --   HCT 33.2*   < > 35.6*  --  34.4* 36.1  --   --   PLT 707*  --  833*  --   --  245  --   --   HEPARINUNFRC  --    < > >2.20* 0.33  --   --  <0.10* 0.29*  CREATININE  --   --   --   --   --  0.54  --   --    < > = values in this interval not displayed.     Assessment: 58 yo F with DVT in the upper and lower extremity. Pharmacy consulted to dose heparin (no bolus). She is noted with recent GIB s/p embolization of giant gastric ulcer on 2/16.   Heparin level is 0.29  Goal of Therapy:  Heparin level 0.3-0.7 units/ml Monitor platelets by anticoagulation protocol: Yes   Plan:  Increase heparin gtt slightly to 1450 units/hr Daily heparin level and CBC  Thank you Anette Guarneri, PharmD [phone] 06/05/2018, 11:34 AM

## 2018-06-06 ENCOUNTER — Inpatient Hospital Stay (HOSPITAL_COMMUNITY): Payer: Medicare Other

## 2018-06-06 LAB — CBC
HCT: 35.8 % — ABNORMAL LOW (ref 36.0–46.0)
Hemoglobin: 11.1 g/dL — ABNORMAL LOW (ref 12.0–15.0)
MCH: 29.6 pg (ref 26.0–34.0)
MCHC: 31 g/dL (ref 30.0–36.0)
MCV: 95.5 fL (ref 80.0–100.0)
Platelets: 848 10*3/uL — ABNORMAL HIGH (ref 150–400)
RBC: 3.75 MIL/uL — ABNORMAL LOW (ref 3.87–5.11)
RDW: 17.2 % — ABNORMAL HIGH (ref 11.5–15.5)
WBC: 17.5 10*3/uL — ABNORMAL HIGH (ref 4.0–10.5)
nRBC: 0 % (ref 0.0–0.2)

## 2018-06-06 LAB — BASIC METABOLIC PANEL
Anion gap: 11 (ref 5–15)
BUN: 17 mg/dL (ref 6–20)
CHLORIDE: 97 mmol/L — AB (ref 98–111)
CO2: 28 mmol/L (ref 22–32)
Calcium: 9.3 mg/dL (ref 8.9–10.3)
Creatinine, Ser: 0.65 mg/dL (ref 0.44–1.00)
GFR calc Af Amer: >60 mL/min (ref >60)
GFR calc non Af Amer: >60 mL/min (ref >60)
Glucose, Bld: 97 mg/dL (ref 70–99)
Potassium: 3.7 mmol/L (ref 3.5–5.1)
Sodium: 136 mmol/L (ref 135–145)

## 2018-06-06 LAB — GLUCOSE, CAPILLARY
GLUCOSE-CAPILLARY: 90 mg/dL (ref 70–99)
GLUCOSE-CAPILLARY: 138 mg/dL — AB (ref 70–99)
Glucose-Capillary: 104 mg/dL — ABNORMAL HIGH (ref 70–99)
Glucose-Capillary: 88 mg/dL (ref 70–99)
Glucose-Capillary: 96 mg/dL (ref 70–99)
Glucose-Capillary: 97 mg/dL (ref 70–99)

## 2018-06-06 LAB — MAGNESIUM: Magnesium: 1.9 mg/dL (ref 1.7–2.4)

## 2018-06-06 LAB — PHOSPHORUS: Phosphorus: 3.8 mg/dL (ref 2.5–4.6)

## 2018-06-06 LAB — HEPARIN LEVEL (UNFRACTIONATED): Heparin Unfractionated: 0.34 IU/mL (ref 0.30–0.70)

## 2018-06-06 MED ORDER — ACETAMINOPHEN 160 MG/5ML PO SOLN
650.0000 mg | Freq: Four times a day (QID) | ORAL | Status: DC | PRN
  Administered 2018-06-06 – 2018-06-07 (×6): 650 mg via ORAL
  Filled 2018-06-06 (×6): qty 20.3

## 2018-06-06 MED ORDER — OXYCODONE HCL 5 MG/5ML PO SOLN
5.0000 mg | Freq: Four times a day (QID) | ORAL | Status: DC | PRN
  Administered 2018-06-06 – 2018-06-08 (×8): 5 mg via ORAL
  Filled 2018-06-06 (×8): qty 5

## 2018-06-06 MED ORDER — FLUOXETINE HCL 20 MG/5ML PO SOLN
10.0000 mg | Freq: Every day | ORAL | Status: DC
  Administered 2018-06-06 – 2018-06-08 (×3): 10 mg via ORAL
  Filled 2018-06-06 (×3): qty 5

## 2018-06-06 MED ORDER — VALPROIC ACID 250 MG/5ML PO SOLN
125.0000 mg | Freq: Every day | ORAL | Status: DC
  Administered 2018-06-06 – 2018-06-08 (×3): 125 mg via ORAL
  Filled 2018-06-06 (×3): qty 5

## 2018-06-06 MED ORDER — DOCUSATE SODIUM 50 MG/5ML PO LIQD: 100.0000 mg | Freq: Two times a day (BID) | ORAL | Status: DC | PRN

## 2018-06-06 MED ORDER — APIXABAN 5 MG PO TABS
10.0000 mg | ORAL_TABLET | Freq: Two times a day (BID) | ORAL | Status: DC
  Administered 2018-06-06 – 2018-06-08 (×5): 10 mg via ORAL
  Filled 2018-06-06 (×6): qty 2

## 2018-06-06 NOTE — Progress Notes (Signed)
Patient refusing to get oob to chair for breakfast this morning. Explained to patient the importance of being upright for meals and the importance of mobility. Patient still adamantly refusing mobility to chair. Offered to place prevalon boots on patient but patient states it makes her too hot and doesn't want them placed. Will continue to monitor.  Hiram Comber, RN 06/06/2018 9:17 AM

## 2018-06-06 NOTE — Progress Notes (Signed)
CSW spoke with patient regarding SNF recommendation. Patient requests that CSW talk with her husband or daughter. Patient's husband's phone did not have voicemail and CSW left vm for daughter.  Christine Locus Hipolito Martinezlopez LCSW [phone]

## 2018-06-06 NOTE — Progress Notes (Signed)
[place] TEAM 1 - Stepdown/ICU TEAM  Christine Ortega  JKD:[id number] DOB: [birth year] DOA: 05/19/2018 PCP: Default, Provider, MD    Brief Narrative:  58 year old with UGI bleed intubated for endoscopy s/p IR embolization of gastroduodenal artery 2/16 for large bleeding gastric ulcer. She had bilateral infiltrates and 3 L oxygen requirement on admission, presumptively diagnosed as community-acquired pneumonia. Intubated. Ecoli UTI.   Significant Events: 2/15 due to excessive GI bleeding transferred to intensive care unit with plan for endoscopy - intubated  2/15 EGD noting "giant gastric ulcer at the antrum and entering duodenal bulb - not amenable to endoscopic therapy" 2/16 mesenteric arteriogram and empiric embolization of the GDA 2/16 change from Precedex to propofol; extubated 2/18 increasing FIO2 needs. Failed BIPAP; reintubated.  2/19 still hypoxic. CXR w/ diffuse airspace disease. PPlats 38 so titrated down to 6 cc/kg PBW. CT obtained. Neg for PE. Was started on NMB.  2/20: off NMB. Still using high RASS goal of -3 to -4. FIO2 and peep down. Still on low dose pressors. Getting lasix.  2/21: FT placed. Diuresing  2/22 cont diuresis. Transition to precedex.  2/23 weaning on 12/5 PSV 2/26 weaning on 10/5 PSV, mental status remains barrier to extubation (is intermittently awake but not following commands) 2/27 weaning on 5/5, more alert 3/1 pulled out her own PICC  Subjective:  Patient in bed, appears comfortable, denies any headache, no fever, no chest pain or pressure, no shortness of breath , no abdominal pain. No focal weakness.   Assessment & Plan:  Acute severe UGIB with profound acute blood loss related anemia.  Seen by GI, EGD showed bleeding gastric ulcer likely caused by NSAID use, status post embolization of GDA by IR.  Currently no ongoing bleed, in the past received 6 unit of packed RBC in ICU.  Continue PPI and monitor H&H.  Will require outpatient GI follow-up post  discharge as well.  H. pylori test was equivocal.  Profound acute blood loss anemia, stable  -  Status post 6 units of packed red blood cells since admission -hemoglobin appears to be stable at this time even on IV heparin  Right lower extremity DVT, left upper extremity DVT  - currently on heparin drip monitor.  ARDS - TRALI - Hypoxemic respiratory failure, Recovering with patient stable now on minimal O2 support.  Encouraged to increase activity, sit up in chair, flutter valve & I-S added.  Will monitor clinically.  Acute Encephalopathy / Delirium - Move out of ICU -PT/OT -avoid sedatives as able. Improved.  E coli UTI - Has completed a course of abx tx.  FUO - ID has evaluated patient -currently off antibiotic therapy, stable for now will monitor clinically.  ADD, bipolar, depression, schizophrenia - Cont depakote and prozac   QTc prolongation - resolved last QTC is 395 ms.  Foot drop - foot drop boots in place, PT, placement.  Constipation - Continue bowel regimen  Nutrition  -   speech following currently on dysphagia 1 diet, core track was discontinued on 06/05/2018.  Will monitor.         DVT prophylaxis: SCDs Code Status: FULL CODE Family Communication: no family present at time of exam  Disposition Plan: Med>>SNF  Consultants:  PCCM GI IR   Antimicrobials:  ceftriaxone 2/15 > 2/23, 2/25 > 2/28 Zithromax 2/15 > 2/17 Vancomycin 2/15, 2/26 > 2/28  Objective: Blood pressure 107/68, pulse 87, temperature 97.7 F (36.5 C), resp. rate 18, height 5\' 3"  (1.6 m), weight 56.4 kg, SpO2  100 %.  Intake/Output Summary (Last 24 hours) at 06/06/2018 1115 Last data filed at 06/06/2018 6440 Gross per 24 hour  Intake 95.14 ml  Output 2130 ml  Net -2034.86 ml   Filed Weights   06/04/18 0310 06/05/18 0636 06/06/18 0500  Weight: 55.6 kg 57.6 kg 56.4 kg    Examination:  Awake Alert, Oriented X 3, No new F.N deficits, Normal affect [place].AT,PERRAL Supple Neck,No JVD, No cervical  lymphadenopathy appriciated.  Symmetrical Chest wall movement, Good air movement bilaterally, CTAB RRR,No Gallops, Rubs or new Murmurs, No Parasternal Heave +ve B.Sounds, Abd Soft, No tenderness, No organomegaly appriciated, No rebound - guarding or rigidity. No Cyanosis, Clubbing or edema, No new Rash or bruise   CBC: Recent Labs  Lab 06/04/18 0427 06/04/18 1101 06/05/18 0600 06/06/18 0446  WBC 12.7*  --  12.5* 17.5*  HGB 10.7* 10.5* 11.0* 11.1*  HCT 35.6* 34.4* 36.1 35.8*  MCV 96.7  --  95.5 95.5  PLT 833*  --  PLATELET CLUMPS NOTED ON SMEAR, UNABLE TO ESTIMATE 347*   Basic Metabolic Panel: Recent Labs  Lab 06/02/18 0433  06/03/18 1620 06/04/18 0427 06/05/18 0600 06/06/18 0446  NA 144  --   --   --  137 136  K 3.9  --   --   --  4.1 3.7  CL 101  --   --   --  100 97*  CO2 34*  --   --   --  30 28  GLUCOSE 143*  --   --   --  102* 97  BUN 30*  --   --   --  23* 17  CREATININE 0.57  --   --   --  0.54 0.65  CALCIUM 8.9  --   --   --  9.2 9.3  MG 2.2   < > 2.2 2.0  --  1.9  PHOS 3.4   < > 2.7 3.0  --  3.8   < > = values in this interval not displayed.   GFR: Estimated Creatinine Clearance: 64.2 mL/min (by C-G formula based on SCr of 0.65 mg/dL).  Liver Function Tests: Recent Labs  Lab 06/05/18 0600  AST 35  ALT 31  ALKPHOS 90  BILITOT 0.4  PROT 6.6  ALBUMIN 2.7*   No results for input(s): LIPASE, AMYLASE in the last 168 hours. No results for input(s): AMMONIA in the last 168 hours.  Coagulation Profile: No results for input(s): INR, PROTIME in the last 168 hours.   Recent Results (from the past 240 hour(s))  Culture, blood (routine x 2)     Status: None   Collection Time: 05/28/18 12:35 PM  Result Value Ref Range Status   Specimen Description BLOOD FOOT RIGHT  Final   Special Requests   Final    BOTTLES DRAWN AEROBIC ONLY Blood Culture results may not be optimal due to an excessive volume of blood received in culture bottles   Culture   Final    NO  GROWTH 5 DAYS Performed at [facility] [address], [place], [place] [address]    Report Status 06/02/2018 FINAL  Final  Culture, blood (routine x 2)     Status: None   Collection Time: 05/28/18 12:35 PM  Result Value Ref Range Status   Specimen Description BLOOD FOOT RIGHT  Final   Special Requests   Final    BOTTLES DRAWN AEROBIC ONLY Blood Culture results may not be optimal due to an  excessive volume of blood received in culture bottles   Culture   Final    NO GROWTH 5 DAYS Performed at [facility], [place] [address], [place], [place] [address]    Report Status 06/02/2018 FINAL  Final  Culture, Urine     Status: None   Collection Time: 05/28/18  3:17 PM  Result Value Ref Range Status   Specimen Description URINE, RANDOM  Final   Special Requests NONE  Final   Culture   Final    NO GROWTH Performed at [facility], [place] [address], [place], [place] [address]    Report Status 05/29/2018 FINAL  Final  Culture, respiratory (non-expectorated)     Status: None   Collection Time: 05/29/18  1:54 PM  Result Value Ref Range Status   Specimen Description TRACHEAL ASPIRATE  Final   Special Requests NONE  Final   Gram Stain   Final    ABUNDANT WBC PRESENT, PREDOMINANTLY MONONUCLEAR RARE SQUAMOUS EPITHELIAL CELLS PRESENT FEW GRAM POSITIVE COCCI    Culture   Final    FEW Consistent with normal respiratory flora. Performed at [facility], [place] [address], [place], [place] [address]    Report Status 06/01/2018 FINAL  Final  Culture, respiratory (non-expectorated)     Status: None   Collection Time: 06/01/18 12:04 PM  Result Value Ref Range Status   Specimen Description TRACHEAL ASPIRATE  Final   Special Requests NONE  Final   Gram Stain NO WBC SEEN NO ORGANISMS SEEN   Final   Culture   Final    RARE Consistent with normal respiratory flora. Performed at [facility], [place] [address] [place], [place] [address]    Report Status 06/03/2018 FINAL   Final  Culture, Urine     Status: None   Collection Time: 06/01/18  2:26 PM  Result Value Ref Range Status   Specimen Description URINE, RANDOM  Final   Special Requests NONE  Final   Culture   Final    NO GROWTH Performed at [facility], [place] [address], [place], [place] [address]    Report Status 06/02/2018 FINAL  Final  Culture, blood (routine x 2)     Status: None (Preliminary result)   Collection Time: 06/01/18 11:08 PM  Result Value Ref Range Status   Specimen Description BLOOD RIGHT FOREARM  Final   Special Requests   Final    BOTTLES DRAWN AEROBIC ONLY Blood Culture adequate volume   Culture   Final    NO GROWTH 3 DAYS Performed at [facility], [place] [address] [place], [place] [address]    Report Status PENDING  Incomplete  Culture, blood (routine x 2)     Status: None (Preliminary result)   Collection Time: 06/01/18 11:15 PM  Result Value Ref Range Status   Specimen Description BLOOD RIGHT ARM  Final   Special Requests   Final    BOTTLES DRAWN AEROBIC ONLY Blood Culture adequate volume   Culture   Final    NO GROWTH 3 DAYS Performed at [facility], [address], [place], [place] [address]    Report Status PENDING  Incomplete     Scheduled Meds: . apixaban  10 mg Oral BID  . chlorhexidine  15 mL Mouth Rinse BID  . FLUoxetine  10 mg Oral Daily  . insulin aspart  0-9 Units Subcutaneous Q4H  . lactose free nutrition  237 mL Oral TID  WC  . mouth rinse  15 mL Mouth Rinse q12n4p  . nystatin cream   Topical TID  . pantoprazole  40 mg Oral BID  . sodium chloride flush  10-40 mL Intracatheter Q12H  . valproic acid  125 mg Oral Daily   Continuous Infusions: . sodium chloride 10 mL/hr at 05/29/18 2000     LOS: 18 days   Signature  Lala Lund M.D on 06/06/2018 at 11:15 AM   -  To page go to [email or web address]

## 2018-06-06 NOTE — Progress Notes (Signed)
SLP Cancellation Note  Patient Details Name: Christine Ortega MRN: [id number] DOB: [birth year]   Cancelled treatment:   Pt is refusing FEES exam.  Will determine if MBS can be scheduled for this afternoon; if not, will schedule for next date.         [name] 06/06/2018, 11:37 AM

## 2018-06-06 NOTE — Progress Notes (Addendum)
ANTICOAGULATION CONSULT NOTE  Pharmacy Consult for Heparin to apixaban  Indication: DVT  Patient Measurements: Height: 5\' 3"  (160 cm) Weight: 124 lb 5.4 oz (56.4 kg) IBW/kg (Calculated) : 52.4  Vital Signs: Temp: 97.7 F (36.5 C) (03/04 0418) BP: 107/68 (03/04 0418) Pulse Rate: 87 (03/04 0418)  Labs: Recent Labs    06/04/18 0427  06/04/18 1101 06/05/18 0600 06/05/18 0606 06/05/18 1008 06/06/18 0446  HGB 10.7*  --  10.5* 11.0*  --   --  11.1*  HCT 35.6*  --  34.4* 36.1  --   --  35.8*  PLT 833*  --   --  PLATELET CLUMPS NOTED ON SMEAR, UNABLE TO ESTIMATE  --   --  848*  HEPARINUNFRC >2.20*   < >  --   --  <0.10* 0.29* 0.34  CREATININE  --   --   --  0.54  --   --  0.65   < > = values in this interval not displayed.     Assessment: 58 yo F with DVT in the upper and lower extremity. Pharmacy consulted to dose heparin (no bolus). She is noted with recent GIB s/p embolization of giant gastric ulcer on 2/16.   Heparin level is 0.34 Switching to apixaban today  Goal of Therapy:  Monitor platelets by anticoagulation protocol: Yes   Plan:  D/c heparin Start apixaban 10 mg PO BID for 1 week then switch to 5 mg PO BID  Thank you Lavena Bullion, PharmD Candidate [phone] 06/06/2018, 9:03 AM

## 2018-06-06 NOTE — Progress Notes (Signed)
Assumed care of patient from [place], [place]. Agree with previous nurses assessment.

## 2018-06-07 ENCOUNTER — Inpatient Hospital Stay (HOSPITAL_COMMUNITY): Payer: Medicare Other

## 2018-06-07 LAB — CULTURE, BLOOD (ROUTINE X 2)
Culture: NO GROWTH
Culture: NO GROWTH
Special Requests: ADEQUATE
Special Requests: ADEQUATE

## 2018-06-07 LAB — BASIC METABOLIC PANEL
Anion gap: 17 — ABNORMAL HIGH (ref 5–15)
BUN: 13 mg/dL (ref 6–20)
CO2: 22 mmol/L (ref 22–32)
CREATININE: 0.63 mg/dL (ref 0.44–1.00)
Calcium: 9.4 mg/dL (ref 8.9–10.3)
Chloride: 98 mmol/L (ref 98–111)
GFR calc Af Amer: >60 mL/min (ref >60)
GFR calc non Af Amer: >60 mL/min (ref >60)
Glucose, Bld: 97 mg/dL (ref 70–99)
Potassium: 4.1 mmol/L (ref 3.5–5.1)
Sodium: 137 mmol/L (ref 135–145)

## 2018-06-07 LAB — CBC
HCT: 36.2 % (ref 36.0–46.0)
Hemoglobin: 11.2 g/dL — ABNORMAL LOW (ref 12.0–15.0)
MCH: 29.3 pg (ref 26.0–34.0)
MCHC: 30.9 g/dL (ref 30.0–36.0)
MCV: 94.8 fL (ref 80.0–100.0)
PLATELETS: 966 10*3/uL — AB (ref 150–400)
RBC: 3.82 MIL/uL — ABNORMAL LOW (ref 3.87–5.11)
RDW: 17.3 % — AB (ref 11.5–15.5)
WBC: 18.4 10*3/uL — ABNORMAL HIGH (ref 4.0–10.5)
nRBC: 0 % (ref 0.0–0.2)

## 2018-06-07 LAB — GLUCOSE, CAPILLARY
Glucose-Capillary: 134 mg/dL — ABNORMAL HIGH (ref 70–99)
Glucose-Capillary: 145 mg/dL — ABNORMAL HIGH (ref 70–99)
Glucose-Capillary: 155 mg/dL — ABNORMAL HIGH (ref 70–99)
Glucose-Capillary: 78 mg/dL (ref 70–99)
Glucose-Capillary: 78 mg/dL (ref 70–99)
Glucose-Capillary: 99 mg/dL (ref 70–99)

## 2018-06-07 MED ORDER — GUAIFENESIN 100 MG/5ML PO SOLN
10.0000 mL | Freq: Four times a day (QID) | ORAL | Status: DC | PRN
  Administered 2018-06-07: 200 mg via ORAL
  Filled 2018-06-07: qty 10

## 2018-06-07 NOTE — Progress Notes (Signed)
[facility] TEAM 1 - Stepdown/ICU TEAM  Christine Ortega  JJK:[id number] DOB: 05/15/1960 DOA: 05/19/2018 PCP: Default, Provider, MD    Brief Narrative:  58 year old with UGI bleed intubated for endoscopy s/p IR embolization of gastroduodenal artery 2/16 for large bleeding gastric ulcer. She had bilateral infiltrates and 3 L oxygen requirement on admission, presumptively diagnosed as community-acquired pneumonia. Intubated. Ecoli UTI.   Significant Events: 2/15 due to excessive GI bleeding transferred to intensive care unit with plan for endoscopy - intubated  2/15 EGD noting "giant gastric ulcer at the antrum and entering duodenal bulb - not amenable to endoscopic therapy" 2/16 mesenteric arteriogram and empiric embolization of the GDA 2/16 change from Precedex to propofol; extubated 2/18 increasing FIO2 needs. Failed BIPAP; reintubated.  2/19 still hypoxic. CXR w/ diffuse airspace disease. PPlats 38 so titrated down to 6 cc/kg PBW. CT obtained. Neg for PE. Was started on NMB.  2/20: off NMB. Still using high RASS goal of -3 to -4. FIO2 and peep down. Still on low dose pressors. Getting lasix.  2/21: FT placed. Diuresing  2/22 cont diuresis. Transition to precedex.  2/23 weaning on 12/5 PSV 2/26 weaning on 10/5 PSV, mental status remains barrier to extubation (is intermittently awake but not following commands) 2/27 weaning on 5/5, more alert 3/1 pulled out her own PICC  Subjective:  Patient in bed, appears comfortable, denies any headache, no fever, no chest pain or pressure, no shortness of breath , no abdominal pain. No focal weakness.  Assessment & Plan:  Acute severe UGIB with profound acute blood loss related anemia.  Seen by GI, EGD showed bleeding gastric ulcer likely caused by NSAID use, status post embolization of GDA by IR.  Currently no ongoing bleed, in the past received 6 unit of packed RBC in ICU.  Continue PPI and monitor H&H.  Will require outpatient GI follow-up post  discharge as well.  H. pylori test was equivocal.  Profound acute blood loss anemia, stable  -  Status post 6 units of packed red blood cells since admission -hemoglobin appears to be stable at this time even on IV heparin & now Eliquis.  Right lower extremity DVT, left upper extremity DVT  - Eliquis now.  ARDS - TRALI - Hypoxemic respiratory failure, Recovering with patient stable now on minimal O2 support.  Encouraged to increase activity, sit up in chair, flutter valve & I-S added.  Will monitor clinically.  Acute Encephalopathy / Delirium - Move out of ICU -PT/OT -avoid sedatives as able. Improved.  E coli UTI - Has completed a course of abx tx.  FUO - ID has evaluated patient -currently off antibiotic therapy, stable for now will monitor clinically.  ADD, bipolar, depression, schizophrenia - Cont depakote and prozac   QTc prolongation - resolved last QTC is 395 ms.  Foot drop - foot drop boots in place, PT, placement.  Constipation - Continue bowel regimen  Leukocytosis - Chronic, stable.  Dysphagia  -   speech following currently on dysphagia 1 diet, core track was discontinued on 06/05/2018. Due for repeat Modified barium 06/07/18.         DVT prophylaxis: SCDs Code Status: FULL CODE Family Communication: no family present at time of exam  Disposition Plan: Med>>SNF  Consultants:  PCCM GI IR   Antimicrobials:  ceftriaxone 2/15 > 2/23, 2/25 > 2/28 Zithromax 2/15 > 2/17 Vancomycin 2/15, 2/26 > 2/28  Objective: Blood pressure 130/86, pulse (!) 116, temperature 97.8 F (36.6 C), temperature source Oral, resp.  rate 16, height 5\' 3"  (1.6 m), weight 55.8 kg, SpO2 98 %.  Intake/Output Summary (Last 24 hours) at 06/07/2018 0930 Last data filed at 06/07/2018 0424 Gross per 24 hour  Intake 720 ml  Output -  Net 720 ml   Filed Weights   06/05/18 0636 06/06/18 0500 06/07/18 0423  Weight: 57.6 kg 56.4 kg 55.8 kg    Examination:  Awake Alert, Oriented X 3, No new F.N  deficits, Normal affect [place].AT,PERRAL Supple Neck,No JVD, No cervical lymphadenopathy appriciated.  Symmetrical Chest wall movement, Good air movement bilaterally, CTAB RRR,No Gallops, Rubs or new Murmurs, No Parasternal Heave +ve B.Sounds, Abd Soft, No tenderness, No organomegaly appriciated, No rebound - guarding or rigidity. No Cyanosis, Clubbing or edema, No new Rash or bruise   CBC: Recent Labs  Lab 06/05/18 0600 06/06/18 0446 06/07/18 0337  WBC 12.5* 17.5* 18.4*  HGB 11.0* 11.1* 11.2*  HCT 36.1 35.8* 36.2  MCV 95.5 95.5 94.8  PLT PLATELET CLUMPS NOTED ON SMEAR, UNABLE TO ESTIMATE 848* 166*   Basic Metabolic Panel: Recent Labs  Lab 06/03/18 1620 06/04/18 0427 06/05/18 0600 06/06/18 0446 06/07/18 0337  NA  --   --  137 136 137  K  --   --  4.1 3.7 4.1  CL  --   --  100 97* 98  CO2  --   --  30 28 22   GLUCOSE  --   --  102* 97 97  BUN  --   --  23* 17 13  CREATININE  --   --  0.54 0.65 0.63  CALCIUM  --   --  9.2 9.3 9.4  MG 2.2 2.0  --  1.9  --   PHOS 2.7 3.0  --  3.8  --    GFR: Estimated Creatinine Clearance: 64.2 mL/min (by C-G formula based on SCr of 0.63 mg/dL).  Liver Function Tests: Recent Labs  Lab 06/05/18 0600  AST 35  ALT 31  ALKPHOS 90  BILITOT 0.4  PROT 6.6  ALBUMIN 2.7*   No results for input(s): LIPASE, AMYLASE in the last 168 hours. No results for input(s): AMMONIA in the last 168 hours.  Coagulation Profile: No results for input(s): INR, PROTIME in the last 168 hours.   Recent Results (from the past 240 hour(s))  Culture, blood (routine x 2)     Status: None   Collection Time: 05/28/18 12:35 PM  Result Value Ref Range Status   Specimen Description BLOOD FOOT RIGHT  Final   Special Requests   Final    BOTTLES DRAWN AEROBIC ONLY Blood Culture results may not be optimal due to an excessive volume of blood received in culture bottles   Culture   Final    NO GROWTH 5 DAYS Performed at [facility] [address]  [place], [place] [address]    Report Status 06/02/2018 FINAL  Final  Culture, blood (routine x 2)     Status: None   Collection Time: 05/28/18 12:35 PM  Result Value Ref Range Status   Specimen Description BLOOD FOOT RIGHT  Final   Special Requests   Final    BOTTLES DRAWN AEROBIC ONLY Blood Culture results may not be optimal due to an excessive volume of blood received in culture bottles   Culture   Final    NO GROWTH 5 DAYS Performed at [facility], [place] [address], [place], [place] [address]    Report Status 06/02/2018 FINAL  Final  Culture, Urine     Status: None   Collection Time: 05/28/18  3:17 PM  Result Value Ref Range Status   Specimen Description URINE, RANDOM  Final   Special Requests NONE  Final   Culture   Final    NO GROWTH Performed at [facility], [address] [place], [place] [address]    Report Status 05/29/2018 FINAL  Final  Culture, respiratory (non-expectorated)     Status: None   Collection Time: 05/29/18  1:54 PM  Result Value Ref Range Status   Specimen Description TRACHEAL ASPIRATE  Final   Special Requests NONE  Final   Gram Stain   Final    ABUNDANT WBC PRESENT, PREDOMINANTLY MONONUCLEAR RARE SQUAMOUS EPITHELIAL CELLS PRESENT FEW GRAM POSITIVE COCCI    Culture   Final    FEW Consistent with normal respiratory flora. Performed at [facility], [place] [address] [place], [place] [address]    Report Status 06/01/2018 FINAL  Final  Culture, respiratory (non-expectorated)     Status: None   Collection Time: 06/01/18 12:04 PM  Result Value Ref Range Status   Specimen Description TRACHEAL ASPIRATE  Final   Special Requests NONE  Final   Gram Stain NO WBC SEEN NO ORGANISMS SEEN   Final   Culture   Final    RARE Consistent with normal respiratory flora. Performed at [facility], [place] [address], [place], [place] [address]    Report Status 06/03/2018 FINAL  Final  Culture, Urine     Status: None   Collection Time: 06/01/18   2:26 PM  Result Value Ref Range Status   Specimen Description URINE, RANDOM  Final   Special Requests NONE  Final   Culture   Final    NO GROWTH Performed at [facility], [place] [address] [place], [place] [address]    Report Status 06/02/2018 FINAL  Final  Culture, blood (routine x 2)     Status: None (Preliminary result)   Collection Time: 06/01/18 11:08 PM  Result Value Ref Range Status   Specimen Description BLOOD RIGHT FOREARM  Final   Special Requests   Final    BOTTLES DRAWN AEROBIC ONLY Blood Culture adequate volume   Culture   Final    NO GROWTH 4 DAYS Performed at [facility], [place] [address] [place], [place] [address]    Report Status PENDING  Incomplete  Culture, blood (routine x 2)     Status: None (Preliminary result)   Collection Time: 06/01/18 11:15 PM  Result Value Ref Range Status   Specimen Description BLOOD RIGHT ARM  Final   Special Requests   Final    BOTTLES DRAWN AEROBIC ONLY Blood Culture adequate volume   Culture   Final    NO GROWTH 4 DAYS Performed at [facility], [place] [address], [place], [place] [address]    Report Status PENDING  Incomplete     Scheduled Meds: . apixaban  10 mg Oral BID  . chlorhexidine  15 mL Mouth Rinse BID  . FLUoxetine  10 mg Oral Daily  . insulin aspart  0-9 Units Subcutaneous Q4H  . lactose free nutrition  237 mL Oral TID WC  . mouth rinse  15 mL Mouth Rinse q12n4p  . nystatin cream   Topical TID  . pantoprazole  40 mg Oral BID  . sodium chloride flush  10-40 mL Intracatheter Q12H  . valproic acid  125 mg  Oral Daily   Continuous Infusions: . sodium chloride 10 mL/hr at 05/29/18 2000     LOS: 19 days   Signature  Lala Lund M.D on 06/07/2018 at 9:30 AM   -  To page go to [email or web address]

## 2018-06-07 NOTE — Clinical Social Work Note (Addendum)
Clinical Social Work Assessment  Patient Details  Name: Christine Ortega MRN: [id number] Date of Birth: [birth year]  Date of referral:  06/07/18               Reason for consult:  Facility Placement                Permission sought to share information with:  Facility Sport and exercise psychologist, Family Supports Permission granted to share information::     Name::     April  Agency::  SNFs  Relationship::  Daughter  Contact Information:  [phone]  Housing/Transportation Living arrangements for the past 2 months:  Single Family Home Source of Information:  Patient, Adult Children Patient Interpreter Needed:  None Criminal Activity/Legal Involvement Pertinent to Current Situation/Hospitalization:  No - Comment as needed Significant Relationships:  Adult Children Lives with:  Adult Children Do you feel safe going back to the place where you live?  Yes Need for family participation in patient care:  No (Coment)  Care giving concerns:  CSW received consult for possible SNF placement at time of discharge. CSW spoke with patient and her daughter regarding PT recommendation of SNF placement at time of discharge. Patient's daughter reported that patient's spouse is currently unable to care for patient at their home given patient's current physical needs. Patient expressed understanding of PT recommendation and is agreeable to SNF placement at time of discharge. CSW to continue to follow and assist with discharge planning needs.   Social Worker assessment / plan:  CSW spoke with patient and her daughter concerning possibility of rehab at [facility] before returning home.  Employment status:  Retired Nurse, adult PT Recommendations:  [place] / Referral to community resources:  [place]  Patient/Family's Response to care:  Patient and her daughter recognize need for rehab before returning home and is agreeable to a SNF. Patient's  daughter reported preference for [facility] since she works there and has already notified them. She will not agree to any other facility. CSW reviewed insurance approval process and will reach out to the facility.  Patient/Family's Understanding of and Emotional Response to Diagnosis, Current Treatment, and Prognosis:  Patient/family is realistic regarding therapy needs and expressed being hopeful for SNF placement. Patient and her daughter expressed understanding of CSW role and discharge process as well as medical condition. No questions/concerns about plan or treatment.    Emotional Assessment Appearance:  Appears stated age Attitude/Demeanor/Rapport:  Gracious Affect (typically observed):  Accepting, Appropriate Orientation:  Oriented to Self, Oriented to Place, Oriented to  Time, Oriented to Situation Alcohol / Substance use:  Not Applicable Psych involvement (Current and /or in the community):  No (Comment)  Discharge Needs  Concerns to be addressed:  Care Coordination Readmission within the last 30 days:  No Current discharge risk:  None Barriers to Discharge:  Continued Medical Work up   [employer], LCSW 06/07/2018, 3:44 PM

## 2018-06-07 NOTE — [place] FL2 (Signed)
[place] MEDICA[place] FL2 LEVEL OF CARE SCREENING TOOL     IDENTIFICATION  Patient Name: Christine Ortega Birthdate: Nov 04, 1960 Sex: female Admission Date (Current Location): 05/19/2018  [facility] and [place] Number:  [employer] and Address:  [facility], [place] [address], [place], [place] [address]      Provider Number: [id number]  Attending Physician Name and Address:  Thurnell Lose, MD  Relative Name and Phone Number:  April, daughter, [phone]    Current Level of Care: Hospital Recommended Level of Care: [place] Prior Approval Number:    Date Approved/Denied:   PASRR Number: Pending   Discharge Plan: SNF    Current Diagnoses: Patient Active Problem List   Diagnosis Date Noted  . Bowel perforation ([place])   . Acute respiratory failure ([place])   . Upper GI bleed   . Sepsis due to undetermined organism ([place]) 05/19/2018  . Acute blood loss anemia 05/19/2018  . Community acquired pneumonia 05/19/2018  . Thrombocytosis ([place]) 05/19/2018  . Hypokalemia   . Schizophrenia, paranoid type ([place]) 08/08/2011  . Narcotic abuse in remission ([place])   . Bipolar disorder ([place])   . Chronic pain   . ADD (attention deficit disorder)     Orientation RESPIRATION BLADDER Height & Weight     Self, Time, Situation, Place  O2(Nasal cannula 1L) Incontinent Weight: 55.8 kg Height:  5\' 3"  (160 cm)  BEHAVIORAL SYMPTOMS/MOOD NEUROLOGICAL BOWEL NUTRITION STATUS      Continent Diet(Please see DC Summary)  AMBULATORY STATUS COMMUNICATION OF NEEDS Skin   Extensive Assist Verbally Normal                       Personal Care Assistance Level of Assistance  Bathing, Feeding, Dressing Bathing Assistance: Maximum assistance Feeding assistance: Limited assistance Dressing Assistance: Limited assistance     Functional Limitations Info  Sight, Hearing, Speech Sight Info: Adequate Hearing Info: Adequate Speech Info: Adequate    SPECIAL  CARE FACTORS FREQUENCY  PT (By licensed PT), OT (By licensed OT), Speech therapy     PT Frequency: 5x/week OT Frequency: 3x/week     Speech Therapy Frequency: 2x/week      Contractures Contractures Info: Not present    Additional Factors Info  Code Status, Allergies, Psychotropic, Insulin Sliding Scale Code Status Info: Full Allergies Info: Tramadol HCl Psychotropic Info: Depakene;Prozac Insulin Sliding Scale Info: Every 4 hours       Current Medications (06/07/2018):  This is the current hospital active medication list Current Facility-Administered Medications  Medication Dose Route Frequency Provider Last Rate Last Dose  . 0.9 %  sodium chloride infusion   Intravenous PRN Juanito Doom, MD 10 mL/hr at 05/29/18 2000    . acetaminophen (TYLENOL) solution 650 mg  650 mg Oral Q6H PRN Thurnell Lose, MD   650 mg at 06/07/18 4944  . apixaban (ELIQUIS) tablet 10 mg  10 mg Oral BID Thurnell Lose, MD   10 mg at 06/07/18 1010  . bisacodyl (DULCOLAX) suppository 10 mg  10 mg Rectal Daily PRN Juanito Doom, MD   10 mg at 05/28/18 0324  . chlorhexidine (PERIDEX) 0.12 % solution 15 mL  15 mL Mouth Rinse BID Icard, Bradley L, DO   15 mL at 06/07/18 1010  . docusate (COLACE) 50 MG/5ML liquid 100 mg  100 mg Oral BID PRN Thurnell Lose, MD      . fentaNYL (SUBLIMAZE) injection 25 mcg  25 mcg Intravenous Q2H PRN Cherene Altes, MD   25 mcg at 06/07/18 1017  . FLUoxetine (PROZAC) 20 MG/5ML solution 10 mg  10 mg Oral Daily Thurnell Lose, MD   10 mg at 06/07/18 1010  . insulin aspart (novoLOG) injection 0-9 Units  0-9 Units Subcutaneous Q4H Anders Simmonds, MD   2 Units at 06/07/18 0413  . lactose free nutrition (BOOST PLUS) liquid 237 mL  237 mL Oral TID WC Thurnell Lose, MD   237 mL at 06/06/18 1717  . MEDLINE mouth rinse  15 mL Mouth Rinse q12n4p Icard, Bradley L, DO   15 mL at 06/04/18 1525  . nystatin cream (MYCOSTATIN)   Topical TID Elmer Sow, MD   1  application at 91/47/82 2206  . oxyCODONE (ROXICODONE) 5 MG/5ML solution 5 mg  5 mg Oral Q6H PRN Thurnell Lose, MD   5 mg at 06/07/18 0306  . pantoprazole (PROTONIX) EC tablet 40 mg  40 mg Oral BID Thurnell Lose, MD   40 mg at 06/07/18 1010  . polyethylene glycol (MIRALAX / GLYCOLAX) packet 17 g  17 g Oral Daily PRN Icard, Bradley L, DO      . RESOURCE THICKENUP CLEAR   Oral PRN Chesley Mires, MD      . senna (SENOKOT) tablet 8.6 mg  1 tablet Oral Daily PRN [name] B, MD   8.6 mg at 05/27/18 1841  . sodium chloride flush (NS) 0.9 % injection 10-40 mL  10-40 mL Intracatheter Q12H Juanito Doom, MD   10 mL at 06/05/18 0923  . valproic acid (DEPAKENE) solution 125 mg  125 mg Oral Daily Thurnell Lose, MD   125 mg at 06/07/18 1010     Discharge Medications: Please see discharge summary for a list of discharge medications.  Relevant Imaging Results:  Relevant Lab Results:   Additional Information SSN: [place], [place]

## 2018-06-07 NOTE — Progress Notes (Signed)
Christine Ortega, daughter, is requesting SW to call her tomorrow to discuss plan. Daughter believes that SW called wrong number since the wrong number was written down. Christine Ortega states that she is HPOA, and patient's spouse does not need called. Christine Ortega, also, asking that RN's keep up with giving Oxycodone and Tylenol together to manage patient's pain. Will continue to monitor.

## 2018-06-07 NOTE — Progress Notes (Signed)
CRITICAL VALUE ALERT  Critical Value:  Platelets 966  Date & Time Notied:  06/07/18 3496   Provider Notified: Baltazar Najjar, NP  Orders Received/Actions taken: No new orders.

## 2018-06-07 NOTE — Progress Notes (Signed)
CSW reached out to patient's daughter to let her know that [facility] is unable to accept the patient for SNF. Patient's daughter states that speech has upgraded the patient's diet and she no longer wants patient to go to SNF. She requests home health services but is unfamiliar with local agencies. She requests a walker, bedside commode, and shower chair. CSW confirmed her address and that she will pick patient up at discharge. She states she need to be in [place] tomorrow so will require the discharge before 11am. CSW will alert RNCM and MD.  Cedric Fishman LCSW [phone]

## 2018-06-07 NOTE — Plan of Care (Signed)

## 2018-06-07 NOTE — Progress Notes (Signed)
Modified Barium Swallow Progress Note  Patient Details  Name: Christine Ortega MRN: [id number] Date of Birth: [birth year]  Today's Date: 06/07/2018  Modified Barium Swallow completed.  Full report located under Chart Review in the Imaging Section.  Brief recommendations include the following:  Clinical Impression  Pt demonstrates significant improvements since 3/1 MBS.  Presents with functional oral preparation and containment.  Swallow response triggers at the level of the pyriforms for thin liquids, valleculae/pyriforms for nectars.  There is complete laryngeal vestibule closure; however, trace amounts of liquids penetrate the larynx prior to closure (PAS #2 and #3).  There was no aspiration, nor were liquids observed to reach the level of the vocal folds.  No concerning amounts of pharyngeal residue remained after the swallow.  For today, recommend advancing the diet to mechanical soft (per preferences) and thin liquids. She should consume small sips.   Meds whole in puree.  SLP will continue to follow for safety/precautions/education. D/W RN.   Swallow Evaluation Recommendations       SLP Diet Recommendations: Dysphagia 3 (Mech soft) solids;Thin liquid   Liquid Administration via: Straw;Cup   Medication Administration: Whole meds with puree   Supervision: Patient able to self feed   Compensations: Small sips/bites   Postural Changes: Remain semi-upright after after feeds/meals (Comment)   Oral Care Recommendations: Oral care BID      Zakariya Knickerbocker L. Tivis Ringer, [place] Office number [phone] Pager [phone]   [name] 06/07/2018,2:24 PM

## 2018-06-07 NOTE — Care Management Note (Addendum)
Case Management Note  Patient Details  Name: Christine Ortega MRN: [id number] Date of Birth: 1961-02-06  Subjective/Objective:   UGIB. Pt without PCP. From home with daughter April.  [name] (Daughter) [name] (Spouse)    [phone]     PCP:  Action/Plan: Transition to home with home health services, start of care 06/11/2018. Pt/daughter made aware. Declined SNF placement.   Pt has transportation to home .  Post hospital f/u: [name]   [phone] [place] [id number]    Next Steps: Go on 07/02/2018    Instructions: 10:30 am, Dr. Asencion Noble      Expected Discharge Date:   06/08/2018           Expected Discharge Plan:  [name]  In-House Referral:  Clinical Social Work(declined SNF placement)  Discharge planning Services  CM Consult  Post Acute Care Choice:  Home Health Choice offered to:  Patient  DME Arranged:  3-N-1, Walker rolling DME Agency:  [employer], will deliver to bedside prior to d/c.  HH Arranged:  Therapist, sports, PT, Nurse's Aide [place] Agency:  [facility]  Status of Service:  Completed, signed off  If discussed at [place] of Stay Meetings, dates discussed:    Additional Comments:  Sharin Mons, RN 06/07/2018, 4:22 PM

## 2018-06-08 ENCOUNTER — Encounter (HOSPITAL_COMMUNITY): Payer: Self-pay

## 2018-06-08 LAB — GLUCOSE, CAPILLARY
Glucose-Capillary: 82 mg/dL (ref 70–99)
Glucose-Capillary: 83 mg/dL (ref 70–99)
Glucose-Capillary: 96 mg/dL (ref 70–99)

## 2018-06-08 LAB — PATHOLOGIST SMEAR REVIEW

## 2018-06-08 MED ORDER — METOPROLOL TARTRATE 25 MG PO TABS: 25.0000 mg | ORAL_TABLET | Freq: Two times a day (BID) | ORAL | 0 refills | Status: DC

## 2018-06-08 MED ORDER — PANTOPRAZOLE SODIUM 40 MG PO TBEC: 40.0000 mg | DELAYED_RELEASE_TABLET | Freq: Two times a day (BID) | ORAL | 0 refills | Status: AC

## 2018-06-08 MED ORDER — ACETAMINOPHEN 500 MG PO TABS: 500.0000 mg | ORAL_TABLET | Freq: Three times a day (TID) | ORAL | 0 refills | Status: AC | PRN

## 2018-06-08 MED ORDER — BOOST PLUS PO LIQD: 237.0000 mL | Freq: Three times a day (TID) | ORAL | 0 refills | Status: DC

## 2018-06-08 MED ORDER — ELIQUIS 5 MG VTE STARTER PACK: ORAL_TABLET | ORAL | 0 refills | Status: DC

## 2018-06-08 MED ORDER — PANTOPRAZOLE SODIUM 40 MG PO TBEC: 40.0000 mg | DELAYED_RELEASE_TABLET | Freq: Every day | ORAL | 0 refills | Status: DC

## 2018-06-08 MED ORDER — APIXABAN 5 MG PO TABS: 5.0000 mg | ORAL_TABLET | Freq: Two times a day (BID) | ORAL | 0 refills | Status: DC

## 2018-06-08 MED ORDER — METOPROLOL TARTRATE 25 MG PO TABS
25.0000 mg | ORAL_TABLET | Freq: Two times a day (BID) | ORAL | Status: DC
  Filled 2018-06-08: qty 1

## 2018-06-08 MED FILL — ELIQUIS STARTER PACK 5 MG T: 5 | 30 days supply | Qty: 74 | Fill #0

## 2018-06-08 NOTE — Plan of Care (Signed)
  Problem: Pain Managment: Goal: General experience of comfort will improve Outcome: Progressing   Problem: Safety: Goal: Ability to remain free from injury will improve Outcome: Progressing   Problem: Skin Integrity: Goal: Risk for impaired skin integrity will decrease Outcome: Progressing   

## 2018-06-08 NOTE — Discharge Summary (Addendum)
Christine Ortega KGS:[id number] DOB: [birth year] DOA: 05/19/2018  PCP: Default, Provider, MD  Admit date: 05/19/2018  Discharge date: 06/08/2018  Admitted From: Home Disposition:  Home ( refused SNF)   Recommendations for Outpatient Follow-up:   Follow up with PCP in 1-2 weeks  PCP Please obtain BMP/CBC, 2 view CXR in 1week,  (see Discharge instructions)   PCP Please follow up on the following pending results:    Home Health: PT,RN,SLP   Equipment/Devices: DME walker, 3 and 1 device Consultations: PCCM, GI Discharge Condition: Stable   CODE STATUS: Full   Diet Recommendation: Soft    Chief Complaint  Patient presents with  . Influenza     Brief history of present illness from the day of admission and additional interim summary    58 year old with UGI bleed intubated for endoscopy s/p IR embolization of gastroduodenal artery 2/16 for large bleeding gastric ulcer. She had bilateral infiltrates and 3 L oxygen requirement on admission, presumptively diagnosed as community-acquired pneumonia. Intubated. Ecoli UTI.                                                                  Hospital Course   Acute severe UGIB with profound acute blood loss related anemia.  Seen by GI, EGD showed bleeding gastric ulcer likely caused by NSAID use, status post embolization of GDA by IR.  Currently no ongoing bleed, in the past received 6 unit of packed RBC in ICU.  Continue PPI and follow with [facility] outpatient in 1 to 2 weeks, PCP to monitor H&H. H. pylori test was equivocal.  Profound acute blood loss anemia, stable -  Status post 6 units of packed red blood cells since admission -hemoglobin appears to be stable at this time even on IV heparin & now Eliquis.  Right lower extremity DVT, left upper extremity DVT  - Eliquis  now.  ARDS - TRALI - Hypoxemic respiratory failure, Recovering with patient stable now on minimal O2 support.  Encouraged to increase activity, sit up in chair, flutter valve & I-S added. This problem has resolved stable on room air currently.  Acute Encephalopathy / Delirium - Move out of ICU -PT/OT -avoid sedatives as able. Improved.  E coli UTI - Has completed a course of abx tx.  ADD, bipolar, depression, schizophrenia - Cont regimen.  Not suicidal homicidal.  Monitor and follow-up with PCP.  QTc prolongation - resolved last QTC is 395 ms.  Foot drop - foot drop boots in place, PT, placement.  Constipation - Continue bowel regimen  Leukocytosis - Chronic, stable.  ECP to monitor as appropriate.  Dysphagia  -   speech following currently on dysphagia 1 diet, continue dysphagia 1 diet for now home speech therapy ordered upon discharge.       Discharge diagnosis  Active Problems:   Chronic pain   Sepsis due to undetermined organism ([place])   Acute blood loss anemia   Community acquired pneumonia   Thrombocytosis (HCC)   Upper GI bleed   Acute respiratory failure (HCC)   Bowel perforation [facility])    Discharge instructions    Discharge Instructions    Discharge instructions   Complete by:  As directed    Follow with Primary MD Default, Provider, MD in 7 days   Get CBC, CMP, 2 view Chest X ray -  checked  by Primary MD or SNF MD in 5-7 days    Activity: As tolerated with Full fall precautions use walker/cane & assistance as needed  Disposition Home   Diet: Soft diet with feeding assistance and aspiration precautions.  Special Instructions: If you have smoked or chewed Tobacco  in the last 2 yrs please stop smoking, stop any regular Alcohol  and or any Recreational drug use.  On your next visit with your primary care physician please Get Medicines reviewed and adjusted.  Please request your Prim.MD to go over all Hospital Tests and Procedure/Radiological  results at the follow up, please get all Hospital records sent to your Prim MD by signing hospital release before you go home.  If you experience worsening of your admission symptoms, develop shortness of breath, life threatening emergency, suicidal or homicidal thoughts you must seek medical attention immediately by calling 911 or calling your MD immediately  if symptoms less severe.  You Must read complete instructions/literature along with all the possible adverse reactions/side effects for all the Medicines you take and that have been prescribed to you. Take any new Medicines after you have completely understood and accpet all the possible adverse reactions/side effects.   Increase activity slowly   Complete by:  As directed       Discharge Medications   Allergies as of 06/08/2018      Reactions   Tramadol Hcl    Per Daughter- patient has " sleep paralysis"       Medication List    STOP taking these medications   ARTHRITIS STRENGTH BC POWDER PO   ibuprofen 200 MG tablet Commonly known as:  ADVIL,MOTRIN   naproxen 500 MG tablet Commonly known as:  NAPROSYN     TAKE these medications   acetaminophen 500 MG tablet Commonly known as:  TYLENOL Take 1 tablet (500 mg total) by mouth every 8 (eight) hours as needed for moderate pain.   apixaban 5 MG Tabs tablet Commonly known as:  ELIQUIS Take 1 tablet (5 mg total) by mouth 2 (two) times daily. 2 pills mouth twice a day till 06/12/2018 then switch to 1 pill twice a day from 06/13/2018   divalproex 500 MG 24 hr tablet Commonly known as:  DEPAKOTE ER Take 500 mg by mouth at bedtime. For moods   FLUoxetine 40 MG capsule Commonly known as:  PROZAC Take 1 capsule (40 mg total) by mouth daily. For depression and anxiety.   lactose free nutrition Liqd Take 237 mLs by mouth 3 (three) times daily with meals.   Melatonin 5 MG Caps Take 20 mg by mouth at bedtime.   metoprolol tartrate 25 MG tablet Commonly known as:   LOPRESSOR Take 1 tablet (25 mg total) by mouth 2 (two) times daily.   pantoprazole 40 MG tablet Commonly known as:  Protonix Take 1 tablet (40 mg total) by mouth 2 (two) times daily.   PROBIOTIC PO Take 1 tablet by mouth  daily. Women's probiotic for GI health   ziprasidone 80 MG capsule Commonly known as:  GEODON Take one capsule at bedtime for depression and mood stability. What changed:    how much to take  how to take this  when to take this  additional instructions            Durable Medical Equipment  (From admission, onward)         Start     Ordered   06/07/18 1628  For home use only DME 3 n 1  Once     06/07/18 1628   06/07/18 1627  For home use only DME Walker rolling  Once    Question:  Patient needs a walker to treat with the following condition  Answer:  Weakness   06/07/18 1628          Follow-up Information    [place] [place] Follow up.   Specialty:  Home Health Services Why:  home health services arranged Contact information: [place] [address] [place]. Go on 07/02/2018.   Why:  10:30 am, Dr. Asencion Noble Contact information: [place] [address] [phone]       Yetta Flock, MD. Schedule an appointment as soon as possible for a visit in 1 week(s).   Specialty:  Gastroenterology Contact information: [place] [address] [phone]        [facility]. Go on 06/25/2018.   Why:  1:00pm - Dr. Chapman Fitch, to establish primary care Contact information: [place] [facility] [place] Address: [address], [place], [place] [address] Phone: [phone]          Major procedures and Radiology Reports - PLEASE review detailed and final reports thoroughly  -     Significant Events: 2/15 due to excessive GI bleeding transferred to  intensive care unit with plan for endoscopy - intubated  2/15 EGD noting "giant gastric ulcer at the antrum and entering duodenal bulb - not amenable to endoscopic therapy" 2/16 mesenteric arteriogram and empiric embolization of the GDA 2/16 change from Precedex to propofol; extubated 2/18 increasing FIO2 needs. Failed BIPAP; reintubated.  2/19 still hypoxic. CXR w/ diffuse airspace disease. PPlats 38 so titrated down to 6 cc/kg PBW. CT obtained. Neg for PE. Was started on NMB.  2/20: off NMB. Still using high RASS goal of -3 to -4. FIO2 and peep down. Still on low dose pressors. Getting lasix.  2/21: FT placed. Diuresing  2/22 cont diuresis. Transition to precedex.  2/23 weaning on 12/5 PSV 2/26 weaning on 10/5 PSV, mental status remains barrier to extubation (is intermittently awake but not following commands) 2/27 weaning on 5/5, more alert 3/1 pulled out her own PICC 3/3/ DC NG Dg Chest 2 View  Result Date: 05/19/2018 CLINICAL DATA:  Shortness of breath and cough EXAM: CHEST - 2 VIEW COMPARISON:  None. FINDINGS: There is diffuse reticulonodular interstitial disease throughout the lungs bilaterally. No consolidation. Heart size and pulmonary vascularity are normal. No adenopathy. No bone lesions. IMPRESSION: Widespread reticulonodular interstitial disease without consolidation. Question chronicity of this interstitial disease. Differential considerations must include atypical infection, somewhat atypical appearance of edema secondary to congestive heart failure, allergic type phenomenon, and possible underlying noncardiogenic interstitial disease. Short interval follow-up chest radiograph advised in this circumstance; repeat study in 2-3 weeks after  appropriate therapy based on clinical symptoms advised. Heart size and pulmonary vascularity are normal. No adenopathy evident. Electronically Signed   By: Lowella Grip III M.D.   On: 05/19/2018 08:21   Ct Angio Chest Pe W Or Wo  Contrast  Result Date: 05/23/2018 CLINICAL DATA:  58 year old female with increasing oxygen requirement. Acute respiratory illness. EXAM: CT ANGIOGRAPHY CHEST WITH CONTRAST TECHNIQUE: Multidetector CT imaging of the chest was performed using the standard protocol during bolus administration of intravenous contrast. Multiplanar CT image reconstructions and MIPs were obtained to evaluate the vascular anatomy. CONTRAST:  2m ISOVUE-370 IOPAMIDOL (ISOVUE-370) INJECTION 76% COMPARISON:  Portable chest earlier today. FINDINGS: Cardiovascular: Excellent contrast bolus timing in the pulmonary arterial tree. No focal filling defect identified in the pulmonary arteries to suggest acute pulmonary embolism. No calcified coronary artery atherosclerosis is evident. Mild atherosclerosis of the visible aorta. No pericardial effusion. Left upper extremity PICC line in place. Mediastinum/Nodes: No mediastinal lymphadenopathy is evident. Lungs/Pleura: Intubated. Endotracheal tube tip above the carina. Diffuse bilateral abnormal pulmonary opacity with both coarse interstitial markings, confluent ground-glass opacity, and occasional peribronchial consolidation (left lower lobe). The major airways remain patent. No pleural effusion. Upper Abdomen: Negative visible liver, spleen, pancreas, adrenal glands, kidneys, and bowel in the upper abdomen. Musculoskeletal: No acute osseous abnormality identified. Review of the MIP images confirms the above findings. IMPRESSION: 1. Negative for acute pulmonary embolus. 2. Diffuse abnormal pulmonary opacity which most resembles severe pulmonary inflammation or infection, and might be superimposed on chronic lung disease. No associated pleural effusion. 3. ETT tip in good position. Electronically Signed   By: HGenevie AnnM.D.   On: 05/23/2018 21:45   Ir Angiogram Visceral Selective  Result Date: 05/20/2018 INDICATION: Acute life-threatening upper GI bleed secondary to gastric/duodenal ulcer. EXAM:  1. ULTRASOUND GUIDANCE FOR ARTERIAL ACCESS 2. SELECTIVE SUPERIOR MESENTERIC ARTERIOGRAM 3. SELECTIVE CELIAC ARTERIOGRAM 4. SELECTIVE COMMON HEPATIC ARTERIOGRAM 5. SELECTIVE GASTRODUODENAL ARTERIOGRAM AND PERCUTANEOUS COIL EMBOLIZATION MEDICATIONS: None ANESTHESIA/SEDATION: Moderate (conscious) sedation was employed during this procedure. A total of Fentanyl 50 mcg was administered intravenously. Moderate Sedation Time: 30 minutes. The patient's level of consciousness and vital signs were monitored continuously by radiology nursing throughout the procedure under my direct supervision. CONTRAST:  50 cc Isovue-300 FLUOROSCOPY TIME:  6 minutes, 6 seconds (2151mGy) COMPLICATIONS: None immediate. PROCEDURE: Informed consent was obtained from the patient's family following explanation of the procedure, risks, benefits and alternatives. The patient understands, agrees and consents for the procedure. All questions were addressed. A time out was performed prior to the initiation of the procedure. Maximal barrier sterile technique utilized including caps, mask, sterile gowns, sterile gloves, large sterile drape, hand hygiene, and Betadine prep. Attempts were made by the ICU staff to obtain a right femoral arterial line however this ultimately proved unsuccessful. Given the failed attempts, the decision was made to access the left common femoral artery. The left femoral head was marked fluoroscopically. Under ultrasound guidance, the left common femoral artery was accessed with a micropuncture kit after the overlying soft tissues were anesthetized with 1% lidocaine. An ultrasound image was saved for documentation purposes. The micropuncture sheath was exchanged for a 5 [place] sheath over a Bentson wire. A closure arteriogram was performed through the side of the sheath confirming access within the right common femoral artery. Over a Bentson wire, a Mickelson catheter was advanced to the level of the thoracic aorta  where it was back bled and flushed. The catheter was then utilized to select the celiac  artery and a selective celiac arteriogram was performed The Mickelson catheter was then utilized to select the superior mesenteric artery and a selective superior mesenteric arteriogram was performed. Again, the Mickelson catheter was utilized to select the celiac artery and a selective celiac arteriogram was performed. Next, with the use of a fathom 14 wire, a regular Renegade microcatheter was advanced into the gastroduodenal artery and a sub selective gastroduodenal arteriogram was performed. A GDA was then coil embolized from its distal aspect to near its origin with multiple overlapping 2, 3 and 4 mm diameter interlock coils. The microcatheter was retracted into the common hepatic artery and a selective common hepatic arteriogram was performed. Next, the microcatheter was removed and a repeat celiac arteriogram was performed via the [facility] catheter. Finally the [facility] catheter was utilized to select the superior mesenteric artery and a selective superior mesenteric arteriogram was performed. Images were reviewed and the procedure was terminated. All wires and catheters removed from the patient. The left common femoral artery vascular sheath was sutured in place for continued arterial monitoring as per the ICU staff. Dressings were placed. FINDINGS: Celiac arteriogram demonstrates conventional branching pattern without definitive area of vessel irregularity or active extravasation. Selective gastroduodenal arteriogram demonstrates hyperemia at the expected location of the duodenal bulb without discrete area active extravasation or vessel irregularity. Following empiric percutaneous coil embolization, there is complete embolization of the gastroduodenal artery. Superior mesenteric arteriogram was negative for definitive retrograde contribution to the gastroduodenal arterial tree. IMPRESSION: Technically successful  inferior coil embolization of the gastroduodenal artery for life-threatening upper GI bleed due to gastric/duodenal ulcer. Electronically Signed   By: Sandi Mariscal M.D.   On: 05/20/2018 09:18   Ir Angiogram Visceral Selective  Result Date: 05/20/2018 INDICATION: Acute life-threatening upper GI bleed secondary to gastric/duodenal ulcer. EXAM: 1. ULTRASOUND GUIDANCE FOR ARTERIAL ACCESS 2. SELECTIVE SUPERIOR MESENTERIC ARTERIOGRAM 3. SELECTIVE CELIAC ARTERIOGRAM 4. SELECTIVE COMMON HEPATIC ARTERIOGRAM 5. SELECTIVE GASTRODUODENAL ARTERIOGRAM AND PERCUTANEOUS COIL EMBOLIZATION MEDICATIONS: None ANESTHESIA/SEDATION: Moderate (conscious) sedation was employed during this procedure. A total of Fentanyl 50 mcg was administered intravenously. Moderate Sedation Time: 30 minutes. The patient's level of consciousness and vital signs were monitored continuously by radiology nursing throughout the procedure under my direct supervision. CONTRAST:  50 cc Isovue-300 FLUOROSCOPY TIME:  6 minutes, 6 seconds (712 mGy) COMPLICATIONS: None immediate. PROCEDURE: Informed consent was obtained from the patient's family following explanation of the procedure, risks, benefits and alternatives. The patient understands, agrees and consents for the procedure. All questions were addressed. A time out was performed prior to the initiation of the procedure. Maximal barrier sterile technique utilized including caps, mask, sterile gowns, sterile gloves, large sterile drape, hand hygiene, and Betadine prep. Attempts were made by the ICU staff to obtain a right femoral arterial line however this ultimately proved unsuccessful. Given the failed attempts, the decision was made to access the left common femoral artery. The left femoral head was marked fluoroscopically. Under ultrasound guidance, the left common femoral artery was accessed with a micropuncture kit after the overlying soft tissues were anesthetized with 1% lidocaine. An ultrasound image  was saved for documentation purposes. The micropuncture sheath was exchanged for a 5 [place] vascular sheath over a Bentson wire. A closure arteriogram was performed through the side of the sheath confirming access within the right common femoral artery. Over a Bentson wire, a Mickelson catheter was advanced to the level of the thoracic aorta where it was back bled and flushed. The catheter was then utilized to  select the celiac artery and a selective celiac arteriogram was performed The Mickelson catheter was then utilized to select the superior mesenteric artery and a selective superior mesenteric arteriogram was performed. Again, the Mickelson catheter was utilized to select the celiac artery and a selective celiac arteriogram was performed. Next, with the use of a fathom 14 wire, a regular Renegade microcatheter was advanced into the gastroduodenal artery and a sub selective gastroduodenal arteriogram was performed. A GDA was then coil embolized from its distal aspect to near its origin with multiple overlapping 2, 3 and 4 mm diameter interlock coils. The microcatheter was retracted into the common hepatic artery and a selective common hepatic arteriogram was performed. Next, the microcatheter was removed and a repeat celiac arteriogram was performed via the [facility] catheter. Finally the [facility] catheter was utilized to select the superior mesenteric artery and a selective superior mesenteric arteriogram was performed. Images were reviewed and the procedure was terminated. All wires and catheters removed from the patient. The left common femoral artery vascular sheath was sutured in place for continued arterial monitoring as per the ICU staff. Dressings were placed. FINDINGS: Celiac arteriogram demonstrates conventional branching pattern without definitive area of vessel irregularity or active extravasation. Selective gastroduodenal arteriogram demonstrates hyperemia at the expected location of the duodenal  bulb without discrete area active extravasation or vessel irregularity. Following empiric percutaneous coil embolization, there is complete embolization of the gastroduodenal artery. Superior mesenteric arteriogram was negative for definitive retrograde contribution to the gastroduodenal arterial tree. IMPRESSION: Technically successful inferior coil embolization of the gastroduodenal artery for life-threatening upper GI bleed due to gastric/duodenal ulcer. Electronically Signed   By: Sandi Mariscal M.D.   On: 05/20/2018 09:18   Ir Angiogram Selective Each Additional Vessel  Result Date: 05/21/2018 INDICATION: Acute life-threatening upper GI bleed secondary to gastric/duodenal ulcer.  EXAM: 1. ULTRASOUND GUIDANCE FOR ARTERIAL ACCESS 2. SELECTIVE SUPERIOR MESENTERIC ARTERIOGRAM 3. SELECTIVE CELIAC ARTERIOGRAM 4. SELECTIVE COMMON HEPATIC ARTERIOGRAM 5. SELECTIVE GASTRODUODENAL ARTERIOGRAM AND PERCUTANEOUS COIL EMBOLIZATION  MEDICATIONS: None  ANESTHESIA/SEDATION: Moderate (conscious) sedation was employed during this procedure. A total of Fentanyl 50 mcg was administered intravenously.  Moderate Sedation Time: 30 minutes. The patient's level of consciousness and vital signs were monitored continuously by radiology nursing throughout the procedure under my direct supervision.  CONTRAST:  50 cc Isovue-300  FLUOROSCOPY TIME:  6 minutes, 6 seconds (902 mGy)  COMPLICATIONS: None immediate.  PROCEDURE: Informed consent was obtained from the patient's family following explanation of the procedure, risks, benefits and alternatives. The patient understands, agrees and consents for the procedure. All questions were addressed. A time out was performed prior to the initiation of the procedure. Maximal barrier sterile technique utilized including caps, mask, sterile gowns, sterile gloves, large sterile drape, hand hygiene, and Betadine prep.  Attempts were made by the ICU staff to obtain a right femoral arterial line  however this ultimately proved unsuccessful. Given the failed attempts, the decision was made to access the left common femoral artery.  The left femoral head was marked fluoroscopically. Under ultrasound guidance, the left common femoral artery was accessed with a micropuncture kit after the overlying soft tissues were anesthetized with 1% lidocaine. An ultrasound image was saved for documentation purposes. The micropuncture sheath was exchanged for a 5 [place] vascular sheath over a Bentson wire. A closure arteriogram was performed through the side of the sheath confirming access within the right common femoral artery.  Over a Bentson wire, a Mickelson catheter was advanced to the level of  the thoracic aorta where it was back bled and flushed. The catheter was then utilized to select the celiac artery and a selective celiac arteriogram was performed  The Mickelson catheter was then utilized to select the superior mesenteric artery and a selective superior mesenteric arteriogram was performed.  Again, the Mickelson catheter was utilized to select the celiac artery and a selective celiac arteriogram was performed.  Next, with the use of a fathom 14 wire, a regular Renegade microcatheter was advanced into the gastroduodenal artery and a sub selective gastroduodenal arteriogram was performed.  A GDA was then coil embolized from its distal aspect to near its origin with multiple overlapping 2, 3 and 4 mm diameter interlock coils.  The microcatheter was retracted into the common hepatic artery and a selective common hepatic arteriogram was performed. Next, the microcatheter was removed and a repeat celiac arteriogram was performed via the [facility] catheter.  Finally the [facility] catheter was utilized to select the superior mesenteric artery and a selective superior mesenteric arteriogram was performed.  Images were reviewed and the procedure was terminated. All wires and catheters removed from the patient.   The left common femoral artery vascular sheath was sutured in place for continued arterial monitoring as per the ICU staff. Dressings were placed.  FINDINGS: Celiac arteriogram demonstrates conventional branching pattern without definitive area of vessel irregularity or active extravasation.  Selective gastroduodenal arteriogram demonstrates hyperemia at the expected location of the duodenal bulb without discrete area active extravasation or vessel irregularity. Following empiric percutaneous coil embolization, there is complete embolization of the gastroduodenal artery.  Superior mesenteric arteriogram was negative for definitive retrograde contribution to the gastroduodenal arterial tree.  IMPRESSION: Technically successful inferior coil embolization of the gastroduodenal artery for life-threatening upper GI bleed due to gastric/duodenal ulcer.   Electronically Signed   By: Sandi Mariscal M.D.   On: 05/20/2018 09:18   Ir Angiogram Selective Each Additional Vessel  Result Date: 05/20/2018 INDICATION: Acute life-threatening upper GI bleed secondary to gastric/duodenal ulcer. EXAM: 1. ULTRASOUND GUIDANCE FOR ARTERIAL ACCESS 2. SELECTIVE SUPERIOR MESENTERIC ARTERIOGRAM 3. SELECTIVE CELIAC ARTERIOGRAM 4. SELECTIVE COMMON HEPATIC ARTERIOGRAM 5. SELECTIVE GASTRODUODENAL ARTERIOGRAM AND PERCUTANEOUS COIL EMBOLIZATION MEDICATIONS: None ANESTHESIA/SEDATION: Moderate (conscious) sedation was employed during this procedure. A total of Fentanyl 50 mcg was administered intravenously. Moderate Sedation Time: 30 minutes. The patient's level of consciousness and vital signs were monitored continuously by radiology nursing throughout the procedure under my direct supervision. CONTRAST:  50 cc Isovue-300 FLUOROSCOPY TIME:  6 minutes, 6 seconds (338 mGy) COMPLICATIONS: None immediate. PROCEDURE: Informed consent was obtained from the patient's family following explanation of the procedure, risks, benefits and alternatives.  The patient understands, agrees and consents for the procedure. All questions were addressed. A time out was performed prior to the initiation of the procedure. Maximal barrier sterile technique utilized including caps, mask, sterile gowns, sterile gloves, large sterile drape, hand hygiene, and Betadine prep. Attempts were made by the ICU staff to obtain a right femoral arterial line however this ultimately proved unsuccessful. Given the failed attempts, the decision was made to access the left common femoral artery. The left femoral head was marked fluoroscopically. Under ultrasound guidance, the left common femoral artery was accessed with a micropuncture kit after the overlying soft tissues were anesthetized with 1% lidocaine. An ultrasound image was saved for documentation purposes. The micropuncture sheath was exchanged for a 5 [place] vascular sheath over a Bentson wire. A closure arteriogram was performed through the side of the sheath confirming access  within the right common femoral artery. Over a Bentson wire, a Mickelson catheter was advanced to the level of the thoracic aorta where it was back bled and flushed. The catheter was then utilized to select the celiac artery and a selective celiac arteriogram was performed The Mickelson catheter was then utilized to select the superior mesenteric artery and a selective superior mesenteric arteriogram was performed. Again, the Mickelson catheter was utilized to select the celiac artery and a selective celiac arteriogram was performed. Next, with the use of a fathom 14 wire, a regular Renegade microcatheter was advanced into the gastroduodenal artery and a sub selective gastroduodenal arteriogram was performed. A GDA was then coil embolized from its distal aspect to near its origin with multiple overlapping 2, 3 and 4 mm diameter interlock coils. The microcatheter was retracted into the common hepatic artery and a selective common hepatic arteriogram was  performed. Next, the microcatheter was removed and a repeat celiac arteriogram was performed via the [facility] catheter. Finally the [facility] catheter was utilized to select the superior mesenteric artery and a selective superior mesenteric arteriogram was performed. Images were reviewed and the procedure was terminated. All wires and catheters removed from the patient. The left common femoral artery vascular sheath was sutured in place for continued arterial monitoring as per the ICU staff. Dressings were placed. FINDINGS: Celiac arteriogram demonstrates conventional branching pattern without definitive area of vessel irregularity or active extravasation. Selective gastroduodenal arteriogram demonstrates hyperemia at the expected location of the duodenal bulb without discrete area active extravasation or vessel irregularity. Following empiric percutaneous coil embolization, there is complete embolization of the gastroduodenal artery. Superior mesenteric arteriogram was negative for definitive retrograde contribution to the gastroduodenal arterial tree. IMPRESSION: Technically successful inferior coil embolization of the gastroduodenal artery for life-threatening upper GI bleed due to gastric/duodenal ulcer. Electronically Signed   By: Sandi Mariscal M.D.   On: 05/20/2018 09:18   Ir US Guide Vasc Access Left  Result Date: 05/20/2018 INDICATION: Acute life-threatening upper GI bleed secondary to gastric/duodenal ulcer. EXAM: 1. ULTRASOUND GUIDANCE FOR ARTERIAL ACCESS 2. SELECTIVE SUPERIOR MESENTERIC ARTERIOGRAM 3. SELECTIVE CELIAC ARTERIOGRAM 4. SELECTIVE COMMON HEPATIC ARTERIOGRAM 5. SELECTIVE GASTRODUODENAL ARTERIOGRAM AND PERCUTANEOUS COIL EMBOLIZATION MEDICATIONS: None ANESTHESIA/SEDATION: Moderate (conscious) sedation was employed during this procedure. A total of Fentanyl 50 mcg was administered intravenously. Moderate Sedation Time: 30 minutes. The patient's level of consciousness and vital signs were  monitored continuously by radiology nursing throughout the procedure under my direct supervision. CONTRAST:  50 cc Isovue-300 FLUOROSCOPY TIME:  6 minutes, 6 seconds (762 mGy) COMPLICATIONS: None immediate. PROCEDURE: Informed consent was obtained from the patient's family following explanation of the procedure, risks, benefits and alternatives. The patient understands, agrees and consents for the procedure. All questions were addressed. A time out was performed prior to the initiation of the procedure. Maximal barrier sterile technique utilized including caps, mask, sterile gowns, sterile gloves, large sterile drape, hand hygiene, and Betadine prep. Attempts were made by the ICU staff to obtain a right femoral arterial line however this ultimately proved unsuccessful. Given the failed attempts, the decision was made to access the left common femoral artery. The left femoral head was marked fluoroscopically. Under ultrasound guidance, the left common femoral artery was accessed with a micropuncture kit after the overlying soft tissues were anesthetized with 1% lidocaine. An ultrasound image was saved for documentation purposes. The micropuncture sheath was exchanged for a 5 [place] vascular sheath over a Bentson wire. A closure arteriogram was performed through the side  of the sheath confirming access within the right common femoral artery. Over a Bentson wire, a Mickelson catheter was advanced to the level of the thoracic aorta where it was back bled and flushed. The catheter was then utilized to select the celiac artery and a selective celiac arteriogram was performed The Mickelson catheter was then utilized to select the superior mesenteric artery and a selective superior mesenteric arteriogram was performed. Again, the Mickelson catheter was utilized to select the celiac artery and a selective celiac arteriogram was performed. Next, with the use of a fathom 14 wire, a regular Renegade microcatheter was advanced  into the gastroduodenal artery and a sub selective gastroduodenal arteriogram was performed. A GDA was then coil embolized from its distal aspect to near its origin with multiple overlapping 2, 3 and 4 mm diameter interlock coils. The microcatheter was retracted into the common hepatic artery and a selective common hepatic arteriogram was performed. Next, the microcatheter was removed and a repeat celiac arteriogram was performed via the [facility] catheter. Finally the [facility] catheter was utilized to select the superior mesenteric artery and a selective superior mesenteric arteriogram was performed. Images were reviewed and the procedure was terminated. All wires and catheters removed from the patient. The left common femoral artery vascular sheath was sutured in place for continued arterial monitoring as per the ICU staff. Dressings were placed. FINDINGS: Celiac arteriogram demonstrates conventional branching pattern without definitive area of vessel irregularity or active extravasation. Selective gastroduodenal arteriogram demonstrates hyperemia at the expected location of the duodenal bulb without discrete area active extravasation or vessel irregularity. Following empiric percutaneous coil embolization, there is complete embolization of the gastroduodenal artery. Superior mesenteric arteriogram was negative for definitive retrograde contribution to the gastroduodenal arterial tree. IMPRESSION: Technically successful inferior coil embolization of the gastroduodenal artery for life-threatening upper GI bleed due to gastric/duodenal ulcer. Electronically Signed   By: Sandi Mariscal M.D.   On: 05/20/2018 09:18   Dg Chest Port 1 View  Result Date: 06/06/2018 CLINICAL DATA:  Weakness and shortness of breath EXAM: PORTABLE CHEST 1 VIEW COMPARISON:  Five days ago FINDINGS: Coarse interstitial opacities with volume loss asymmetric to the right. Normal heart size and stable mediastinal contours. Hardware has been  removed since prior. High-density stool seen in the left upper quadrant IMPRESSION: Generalized interstitial opacity with fibrotic features. No new or acute finding when compared to 06/02/2018. Electronically Signed   By: Monte Fantasia M.D.   On: 06/06/2018 08:17   Dg Chest Port 1 View  Result Date: 06/02/2018 CLINICAL DATA:  Respiratory failure.  Follow-up exam. EXAM: PORTABLE CHEST 1 VIEW COMPARISON:  06/01/2018 and older studies. FINDINGS: Since the previous day's exam, the endotracheal tube has been removed. Left PICC and enteric tube are unchanged. Lungs demonstrate persistent interstitial and hazy airspace opacities, greater on the right, without significant change. No new lung abnormalities. No convincing pleural effusion and no pneumothorax. IMPRESSION: 1. Status post removal of the endotracheal tube. 2. No other change from the previous day's study. Electronically Signed   By: Lajean Manes M.D.   On: 06/02/2018 07:26   Dg Chest Port 1 View  Result Date: 06/01/2018 CLINICAL DATA:  Respiratory insufficiency. Persistent fevers. Follow-up exam. EXAM: PORTABLE CHEST 1 VIEW COMPARISON:  05/30/2018 and older studies. FINDINGS: Bilateral interstitial and hazy airspace opacities are without change from the recent prior exams. No new lung abnormalities. No convincing pleural effusion and no pneumothorax. Endotracheal tube, enteric feeding tube and left PICC are stable. IMPRESSION: 1. No  significant change from the prior study. Persistent interstitial and hazy airspace lung opacities bilaterally consistent with infection or inflammation. 2. Support apparatus is stable. Enteric feeding tube tip projects in the distal stomach. Electronically Signed   By: Lajean Manes M.D.   On: 06/01/2018 10:48   Dg Chest Port 1 View  Result Date: 05/30/2018 CLINICAL DATA:  Respiratory failure with hypoxia. EXAM: PORTABLE CHEST 1 VIEW COMPARISON:  05/29/2018 FINDINGS: Endotracheal tube, enteric tube and left PICC line  unchanged. Patchy bilateral mixed interstitial airspace density right worse than left. Possible mild improved aeration over the left midlung. No effusion. Cardiomediastinal silhouette and remainder of the exam is unchanged. IMPRESSION: Persistent bilateral patchy mixed interstitial airspace process a possible mild improved aeration over the left midlung. Tubes and lines unchanged. Electronically Signed   By: Marin Olp M.D.   On: 05/30/2018 11:41   Dg Chest Port 1 View  Result Date: 05/29/2018 CLINICAL DATA:  Respiratory failure and hypoxia EXAM: PORTABLE CHEST 1 VIEW COMPARISON:  05/28/2018 FINDINGS: Endotracheal tube, left PICC line and feeding catheter are again noted and stable. Cardiac shadow is stable. Diffuse stable opacities are noted in both lungs similar to that seen on the prior exam. No new focal infiltrate is seen. No bony abnormality is noted. IMPRESSION: Stable appearance of the chest with diffuse airspace disease. No new focal abnormality is noted. Electronically Signed   By: Inez Catalina M.D.   On: 05/29/2018 12:48   Dg Chest Port 1 View  Result Date: 05/28/2018 CLINICAL DATA:  Acute respiratory failure EXAM: PORTABLE CHEST 1 VIEW COMPARISON:  05/27/2018 FINDINGS: Cardiac shadow is stable. Endotracheal tube, feeding catheter and left-sided PICC line are noted. Changes of embolization are noted in the upper mid abdomen. Diffuse hazy opacities are identified throughout both lungs stable from the previous exam. No new focal abnormality is seen. IMPRESSION: No change from the previous exam. Electronically Signed   By: Inez Catalina M.D.   On: 05/28/2018 07:24   Dg Chest Port 1 View  Result Date: 05/27/2018 CLINICAL DATA:  Pneumonia EXAM: PORTABLE CHEST 1 VIEW COMPARISON:  05/26/2018 FINDINGS: Endotracheal tube in good position. Feeding tube enters the stomach with the tip not visualized. Left arm PICC tip at the cavoatrial junction unchanged. Severe diffuse bilateral airspace disease  unchanged. No effusion or pneumothorax. IMPRESSION: Severe diffuse bilateral airspace disease unchanged. Electronically Signed   By: Franchot Gallo M.D.   On: 05/27/2018 08:04   Dg Chest Port 1 View  Result Date: 05/26/2018 CLINICAL DATA:  Encounter for peripherally inserted central venous catheter placement. EXAM: PORTABLE CHEST 1 VIEW COMPARISON:  05/25/2018 FINDINGS: Left arm PICC line is present. Catheter tip is near the superior cavoatrial junction and appropriately positioned. Endotracheal tube is roughly 2.1 cm above the carina. Feeding tube extends into the abdomen. No change in the bilateral interstitial lung densities. Heart size is grossly stable. Negative for a pneumothorax. IMPRESSION: 1. PICC line tip is at the superior cavoatrial junction and appropriately positioned. 2. No change in the parenchymal lung disease. Electronically Signed   By: Markus Daft M.D.   On: 05/26/2018 12:56   Dg Chest Port 1 View  Result Date: 05/25/2018 CLINICAL DATA:  Occluded PICC line. EXAM: PORTABLE CHEST 1 VIEW COMPARISON:  Chest x-ray 05/25/2018. FINDINGS: Left PICC line noted with its tip projected over the superior vena caval on today's exam. Endotracheal tube, feeding tube in stable position. Heart size normal. Diffuse bilateral severe pulmonary interstitial prominence again noted. No pleural effusion  or pneumothorax. IMPRESSION: 1. Left PICC line noted with tip over superior vena cava on today's exam. Endotracheal tube and feeding tube in stable position. 2. Severe bilateral interstitial lung disease again noted. No interim change. Electronically Signed   By: Marcello Moores  Register   On: 05/25/2018 15:10   Dg Chest Port 1 View  Result Date: 05/25/2018 CLINICAL DATA:  Repositioning LEFT arm PICC placed at bedside. EXAM: PORTABLE CHEST 1 VIEW 12:33 p.m.: COMPARISON:  Portable chest x-ray earlier same day 9:44 a.m. and previously. FINDINGS: LEFT arm PICC again courses upward into the expected location of the RIGHT  internal jugular vein. Remaining support apparatus satisfactory. Multilobar pneumonia and/or ARDS is unchanged. IMPRESSION: LEFT arm PICC again courses upward with its tip at the expected location of the RIGHT internal jugular vein. Electronically Signed   By: Evangeline Dakin M.D.   On: 05/25/2018 13:00   Dg Chest Port 1 View  Result Date: 05/25/2018 CLINICAL DATA:  PICC line placement EXAM: PORTABLE CHEST 1 VIEW COMPARISON:  05/24/2018 FINDINGS: PICC line remains directed cephalad in the in superior vena cava. Endotracheal tube 2.5 cm from carina. Introduction of feeding tube which passes into the stomach. The tip is in the gastric body. Diffuse fine interstitial/alveolar opacities. IMPRESSION: 1. Introduction of feeding tube with tip in the gastric body. 2. PICC line remains directed cephalad in the superior vena cava. 3. Stable bilateral interstitial and airspace pulmonary opacities Electronically Signed   By: Suzy Bouchard M.D.   On: 05/25/2018 10:20   Dg Chest Port 1 View  Result Date: 05/24/2018 CLINICAL DATA:  Acute respiratory failure. EXAM: PORTABLE CHEST 1 VIEW COMPARISON:  05/23/2018. FINDINGS: ET tube slightly low, tip 16 mm above carina should be pulled back at least 2 cm. A PICC line from the LEFT arm is malpositioned, with its tip in the RIGHT internal jugular after passing cephalad from the LEFT innominate SVC junction. BILATERAL pulmonary opacities are redemonstrated, and may be slightly improved. IMPRESSION: 1. ET tube 16 mm above carina, should be pulled back at least 2 cm. 2. Malpositioned PICC line, LEFT arm approach, tip in RIGHT IJ. 3. Slight improvement aeration. 4. These results will be called to the ordering clinician or representative by the Radiologist Assistant, and communication documented in the PACS or zVision Dashboard. Electronically Signed   By: Staci Righter M.D.   On: 05/24/2018 07:15   Dg Chest Port 1 View  Result Date: 05/23/2018 CLINICAL DATA:  Respiratory  failure EXAM: PORTABLE CHEST 1 VIEW COMPARISON:  Yesterday FINDINGS: Improved endotracheal tube positioning with tip now above the carina, at the level of the clavicular heads. Unchanged bilateral coarse interstitial opacities. No pleural fluid or pneumothorax. Normal heart size. Lumbar laminectomy changes are seen. IMPRESSION: 1. Improved endotracheal tube positioning. 2. Unchanged generalized interstitial opacity. Electronically Signed   By: Monte Fantasia M.D.   On: 05/23/2018 07:41   Portable Chest X-ray  Result Date: 05/22/2018 CLINICAL DATA:  Status post intubation EXAM: PORTABLE CHEST 1 VIEW COMPARISON:  05/22/2018 FINDINGS: Interval intubation. Tip of the endotracheal tube is in the proximal right mainstem bronchus. Extensive reticular and ground-glass opacity. Stable cardiomediastinal silhouette. Apical pleural thickening. No pneumothorax. IMPRESSION: 1. Endotracheal tube tip overlies the proximal right mainstem bronchus. Consider retraction by approximately 3-3.5 cm. 2. Continued extensive reticular and ground-glass opacity which may be secondary to edema or diffuse pneumonia. Critical Value/emergent results were called by telephone at the time of interpretation on 05/22/2018 at 6:03 pm to Dr. [name] ,  who verbally acknowledged these results. Electronically Signed   By: Donavan Foil M.D.   On: 05/22/2018 18:09   Dg Chest Port 1 View  Result Date: 05/22/2018 CLINICAL DATA:  58 year old female with acute respiratory failure. Subsequent encounter. EXAM: PORTABLE CHEST 1 VIEW COMPARISON:  05/19/2018 chest x-ray. FINDINGS: Progressive diffuse airspace disease with ARDS pattern. This represent pulmonary edema although infectious infiltrate not excluded in the proper clinical setting. Limited evaluation of mediastinal and cardiac silhouette. No obvious pneumothorax. IMPRESSION: Progressive diffuse airspace disease with ARDS pattern. This may reflect result of pulmonary edema. Infectious process  not excluded in proper clinical setting. These results will be called to the ordering clinician or representative by the Radiologist Assistant, and communication documented in the PACS or zVision Dashboard. Electronically Signed   By: Genia Del M.D.   On: 05/22/2018 07:23   Dg Chest Port 1 View  Result Date: 05/19/2018 CLINICAL DATA:  Respiratory failure EXAM: PORTABLE CHEST 1 VIEW COMPARISON:  Chest x-ray from earlier same day. FINDINGS: Endotracheal tube has been placed with tip well-positioned at approximately 3 cm above the carina. Heart size and mediastinal contours are stable in the short-term interval. The bilateral reticulonodular opacities are unchanged in the short-term interval. No pleural effusion or pneumothorax seen. IMPRESSION: 1. Endotracheal tube well positioned with tip approximately 3 cm above the carina. 2. Otherwise stable exam. Electronically Signed   By: Franki Cabot M.D.   On: 05/19/2018 23:29   Dg Abd Portable 1v  Result Date: 05/25/2018 CLINICAL DATA:  58 year old female with a history of feeding tube placement EXAM: PORTABLE ABDOMEN - 1 VIEW COMPARISON:  05/20/2018 FINDINGS: Gas within stomach, small bowel, colon.  No abnormal distension. Pelvic thermister in place. Metallic tip enteric feeding tube terminates within the stomach, near the pylorus. Coil pack of prior angiogram and embolization. Left femoral vascular sheath in place. IMPRESSION: Nonobstructive bowel gas pattern. Metallic tip enteric feeding tube terminates near the pylorus in the stomach. Left femoral vascular access. Electronically Signed   By: Corrie Mckusick D.O.   On: 05/25/2018 10:07   Dg Abd Portable 1v  Result Date: 05/20/2018 CLINICAL DATA:  Orogastric tube placement. EXAM: PORTABLE ABDOMEN - 1 VIEW COMPARISON:  One view abdomen 05/19/2018. FINDINGS: 1358 hours. Enteric tube is looped in the proximal stomach. Interval decompression of the stomach. There is moderate distention of the colon. Embolization  clips overlie the upper lumbar spine. There are fibrotic changes at both lung bases. IMPRESSION: Enteric tube is coiled in the proximal stomach with good decompression of the stomach. Increased colonic distension, likely ileus. Electronically Signed   By: Richardean Sale M.D.   On: 05/20/2018 14:23   Dg Abd Portable 1v  Result Date: 05/19/2018 CLINICAL DATA:  Bowel perforation. EXAM: PORTABLE ABDOMEN - 1 VIEW COMPARISON:  None. FINDINGS: Stomach and small bowel are distended with air. No evidence of small bowel obstruction. No convincing evidence of free intraperitoneal air. IMPRESSION: No convincing evidence of bowel perforation on this semi-erect plain film examination of the abdomen. If clinical concern for bowel perforation persists, would consider additional decubitus view. Electronically Signed   By: Franki Cabot M.D.   On: 05/19/2018 23:31   Dg Swallowing Func-speech Pathology  Result Date: 06/07/2018 Objective Swallowing Evaluation: Type of Study: MBS-Modified Barium Swallow Study  Patient Details Name: JOYCELYNN FRITSCHE MRN: [id number] Date of Birth: 10-05-1960 Today's Date: 06/07/2018 Time: SLP Start Time (ACUTE ONLY): 1150 -SLP Stop Time (ACUTE ONLY): 1225 SLP Time Calculation (min) (ACUTE ONLY): 35  min Past Medical History: Past Medical History: Diagnosis Date . ADD (attention deficit disorder)  . Anxiety  . Bipolar disorder ([place])  . Chronic pain  . Depression  . Narcotic abuse in remission ([place])  . Schizophrenia ([place])  . Seizures ([place])  Past Surgical History: Past Surgical History: Procedure Laterality Date . BACK SURGERY   . ESOPHAGOGASTRODUODENOSCOPY N/A 05/19/2018  Procedure: ESOPHAGOGASTRODUODENOSCOPY (EGD);  Surgeon: Yetta Flock, MD;  Location: [facility] ENDOSCOPY;  Service: Gastroenterology;  Laterality: N/A;  egd at bedside in ICU, intubated . IR ANGIOGRAM SELECTIVE EACH ADDITIONAL VESSEL  05/20/2018 . IR ANGIOGRAM SELECTIVE EACH ADDITIONAL VESSEL  05/20/2018 . IR ANGIOGRAM VISCERAL SELECTIVE   05/20/2018 . IR ANGIOGRAM VISCERAL SELECTIVE  05/20/2018 . IR EMBO ART  VEN HEMORR LYMPH EXTRAV  INC GUIDE ROADMAPPING  05/20/2018 . IR US GUIDE VASC ACCESS LEFT  05/20/2018 HPI: 58 year old with UGI bleed intubated 2/15-2/17 for endoscopy s/p IR embolization of gastroduodenal artery for large bleeding gastric ulcer. Reintubated 2/18-2/29. She had bilateral infiltrates and 3 L oxygen requirement on admission, presumptively diagnosed as community-acquired pneumonia. Hx ADD, bipolar, depression, schizophrenia, chronic pain with hx use of NSAIDS.  Subjective: alert, not happy about having to drink barium Assessment / Plan / Recommendation CHL IP CLINICAL IMPRESSIONS 06/07/2018 Clinical Impression Pt demonstrates significant improvements since 3/1 MBS.  Presents with functional oral preparation and containment.  Swallow response triggers at the level of the pyriforms for thin liquids, valleculae/pyriforms for nectars.  There is complete laryngeal vestibule closure; however, trace amounts of liquids penetrate the larynx prior to closure (PAS #2 and #3).  There was no aspiration, nor were liquids observed to reach the level of the vocal folds.  No concerning amounts of pharyngeal residue remained after the swallow.  For today, recommend advancing the diet to mechanical soft (per preferences) and thin liquids. She should consume small sips.   Meds whole in puree.  SLP will continue to follow for safety/precautions/education. D/W RN. SLP Visit Diagnosis Dysphagia, pharyngeal phase (R13.13) Attention and concentration deficit following -- Frontal lobe and executive function deficit following -- Impact on safety and function Mild aspiration risk   CHL IP TREATMENT RECOMMENDATION 06/07/2018 Treatment Recommendations Therapy as outlined in treatment plan below   Prognosis 06/03/2018 Prognosis for Safe Diet Advancement Good Barriers to Reach Goals Severity of deficits Barriers/Prognosis Comment -- CHL IP DIET RECOMMENDATION 06/07/2018 SLP  Diet Recommendations Dysphagia 3 (Mech soft) solids;Thin liquid Liquid Administration via Straw;Cup Medication Administration Whole meds with puree Compensations Small sips/bites Postural Changes Remain semi-upright after after feeds/meals (Comment)   CHL IP OTHER RECOMMENDATIONS 06/07/2018 Recommended Consults -- Oral Care Recommendations Oral care BID Other Recommendations --   CHL IP FOLLOW UP RECOMMENDATIONS 06/07/2018 Follow up Recommendations Other (comment)   CHL IP FREQUENCY AND DURATION 06/07/2018 Speech Therapy Frequency (ACUTE ONLY) min 2x/week Treatment Duration 1 week      CHL IP ORAL PHASE 06/07/2018 Oral Phase WFL Oral - Pudding Teaspoon -- Oral - Pudding Cup -- Oral - Honey Teaspoon -- Oral - Honey Cup -- Oral - Nectar Teaspoon -- Oral - Nectar Cup -- Oral - Nectar Straw -- Oral - Thin Teaspoon -- Oral - Thin Cup -- Oral - Thin Straw -- Oral - Puree WFL Oral - Mech Soft -- Oral - Regular -- Oral - Multi-Consistency WFL Oral - Pill -- Oral Phase - Comment --  CHL IP PHARYNGEAL PHASE 06/07/2018 Pharyngeal Phase Impaired Pharyngeal- Pudding Teaspoon -- Pharyngeal -- Pharyngeal- Pudding Cup -- Pharyngeal -- Pharyngeal- Honey  Teaspoon NT Pharyngeal -- Pharyngeal- Honey Cup -- Pharyngeal -- Pharyngeal- Nectar Teaspoon NT Pharyngeal -- Pharyngeal- Nectar Cup Delayed swallow initiation-pyriform sinuses;Delayed swallow initiation-vallecula;Reduced airway/laryngeal closure;Penetration/Aspiration before swallow Pharyngeal Material enters airway, remains ABOVE vocal cords then ejected out Pharyngeal- Nectar Straw -- Pharyngeal -- Pharyngeal- Thin Teaspoon NT Pharyngeal -- Pharyngeal- Thin Cup Delayed swallow initiation-pyriform sinuses;Reduced airway/laryngeal closure;Penetration/Aspiration before swallow Pharyngeal Material enters airway, remains ABOVE vocal cords then ejected out;Material enters airway, remains ABOVE vocal cords and not ejected out Pharyngeal- Thin Straw NT Pharyngeal -- Pharyngeal- Puree Delayed  swallow initiation-vallecula Pharyngeal -- Pharyngeal- Mechanical Soft Delayed swallow initiation-vallecula Pharyngeal -- Pharyngeal- Regular -- Pharyngeal -- Pharyngeal- Multi-consistency -- Pharyngeal -- Pharyngeal- Pill -- Pharyngeal -- Pharyngeal Comment --  CHL IP CERVICAL ESOPHAGEAL PHASE 06/03/2018 Cervical Esophageal Phase WFL Pudding Teaspoon -- Pudding Cup -- Honey Teaspoon -- Honey Cup -- Nectar Teaspoon -- Nectar Cup -- Nectar Straw -- Thin Teaspoon -- Thin Cup -- Thin Straw -- Puree -- Mechanical Soft -- Regular -- Multi-consistency -- Pill -- Cervical Esophageal Comment -- [name] 06/07/2018, 2:25 PM              Dg Swallowing Func-speech Pathology  Result Date: 06/03/2018 Objective Swallowing Evaluation: Type of Study: MBS-Modified Barium Swallow Study  Patient Details Name: MALONIE TATUM MRN: [id number] Date of Birth: Apr 25, 1960 Today's Date: 06/03/2018 Time: SLP Start Time (ACUTE ONLY): 1310 -SLP Stop Time (ACUTE ONLY): 1340 SLP Time Calculation (min) (ACUTE ONLY): 30 min Past Medical History: Past Medical History: Diagnosis Date . ADD (attention deficit disorder)  . Anxiety  . Bipolar disorder ([place])  . Chronic pain  . Depression  . Narcotic abuse in remission ([place])  . Schizophrenia ([place])  . Seizures ([place])  Past Surgical History: Past Surgical History: Procedure Laterality Date . BACK SURGERY   . ESOPHAGOGASTRODUODENOSCOPY N/A 05/19/2018  Procedure: ESOPHAGOGASTRODUODENOSCOPY (EGD);  Surgeon: Yetta Flock, MD;  Location: [facility] ENDOSCOPY;  Service: Gastroenterology;  Laterality: N/A;  egd at bedside in ICU, intubated . IR ANGIOGRAM SELECTIVE EACH ADDITIONAL VESSEL  05/20/2018 . IR ANGIOGRAM SELECTIVE EACH ADDITIONAL VESSEL  05/20/2018 . IR ANGIOGRAM VISCERAL SELECTIVE  05/20/2018 . IR ANGIOGRAM VISCERAL SELECTIVE  05/20/2018 . IR EMBO ART  VEN HEMORR LYMPH EXTRAV  INC GUIDE ROADMAPPING  05/20/2018 . IR US GUIDE VASC ACCESS LEFT  05/20/2018 HPI: 58 year old with UGI bleed intubated  2/15-2/17 for endoscopy s/p IR embolization of gastroduodenal artery for large bleeding gastric ulcer. She had bilateral infiltrates and 3 L oxygen requirement on admission, presumptively diagnosed as community-acquired pneumonia. Hx ADD, bipolar, depression, schizophrenia, chronic pain with hx use of NSAIDS.  Subjective: The patient was seen in radiology.  Assessment / Plan / Recommendation CHL IP CLINICAL IMPRESSIONS 06/03/2018 Clinical Impression MBS was completed using thin liquids via spoon and cup, nectar thick liquids via spoon and cup, honey thick liquids via spoon and cup, pureed material and dual textured solids.  The patient presented with a mild oral and moderate to severe pharyngeal dysphagia.  The oral phase was marked by decreased lingual movement which led to delayed oral transit particularly given purees and dual textured solids.  Good lingual control was generally seen especially duing directed bolus hold, although, on several occassions mild premature loss of the bolus was seen.  The pharyngeal phase was marked by a swallow trigger generally in the pyriform sinuses or after material entered the laryngeal vestibule.  In addition, the following was also seen:  decreased anterior laryngeal movement, decreased laryngeal elevation, decreased laryngeal  vestibule closure, decreased base of tongue retraction and decreased pharyngeal stripping wave.  Aspiration was seen during and prior to the swallow gven thin liquids, nectar thick liquids and honey thick liquids.  She was only noted to cough on the aspiration of thin liquids.  All other events were SILENT!  No penetration or aspiration was seen on purees or dual textured solids.  Esophageal sweep revealed it slow to clear.  Recommend begin a dysphagia 1 diet with NO liquids with FULL supervision/assist.  She should remain upright for at least 30 minutes after meals.  Allow ice chips between meals.  Oral care should be completed at minumum 3 times per day  using a toothbrush and toothpaste.   SLP Visit Diagnosis Dysphagia, oropharyngeal phase (R13.12) Attention and concentration deficit following -- Frontal lobe and executive function deficit following -- Impact on safety and function Severe aspiration risk   CHL IP TREATMENT RECOMMENDATION 06/03/2018 Treatment Recommendations Therapy as outlined in treatment plan below   Prognosis 06/03/2018 Prognosis for Safe Diet Advancement Good Barriers to Reach Goals Severity of deficits Barriers/Prognosis Comment -- CHL IP DIET RECOMMENDATION 06/03/2018 SLP Diet Recommendations Dysphagia 1 (Puree) solids;Pudding thick liquid Liquid Administration via Spoon Medication Administration Crushed with puree Compensations Slow rate;Small sips/bites;Follow solids with liquid Postural Changes Remain semi-upright after after feeds/meals (Comment);Seated upright at 90 degrees   CHL IP OTHER RECOMMENDATIONS 06/03/2018 Recommended Consults -- Oral Care Recommendations Oral care QID;Staff/trained caregiver to provide oral care Other Recommendations Have oral suction available   CHL IP FOLLOW UP RECOMMENDATIONS 06/03/2018 Follow up Recommendations Other (comment)   CHL IP FREQUENCY AND DURATION 06/03/2018 Speech Therapy Frequency (ACUTE ONLY) min 2x/week Treatment Duration 2 weeks      CHL IP ORAL PHASE 06/03/2018 Oral Phase Impaired Oral - Pudding Teaspoon -- Oral - Pudding Cup -- Oral - Honey Teaspoon -- Oral - Honey Cup -- Oral - Nectar Teaspoon -- Oral - Nectar Cup -- Oral - Nectar Straw -- Oral - Thin Teaspoon -- Oral - Thin Cup -- Oral - Thin Straw -- Oral - Puree Delayed oral transit Oral - Mech Soft -- Oral - Regular -- Oral - Multi-Consistency Delayed oral transit;Weak lingual manipulation Oral - Pill -- Oral Phase - Comment --  CHL IP PHARYNGEAL PHASE 06/03/2018 Pharyngeal Phase Impaired Pharyngeal- Pudding Teaspoon -- Pharyngeal -- Pharyngeal- Pudding Cup -- Pharyngeal -- Pharyngeal- Honey Teaspoon -- Pharyngeal -- Pharyngeal- Honey Cup Delayed  swallow initiation-pyriform sinuses;Reduced anterior laryngeal mobility;Reduced laryngeal elevation;Reduced airway/laryngeal closure;Reduced tongue base retraction;Penetration/Aspiration before swallow;Penetration/Aspiration during swallow;Pharyngeal residue - valleculae;Reduced pharyngeal peristalsis Pharyngeal Material enters airway, passes BELOW cords then ejected out Pharyngeal- Nectar Teaspoon Delayed swallow initiation-pyriform sinuses;Reduced pharyngeal peristalsis;Reduced anterior laryngeal mobility;Reduced laryngeal elevation;Reduced airway/laryngeal closure;Reduced tongue base retraction;Penetration/Aspiration before swallow;Penetration/Aspiration during swallow;Pharyngeal residue - valleculae Pharyngeal Material enters airway, passes BELOW cords without attempt by patient to eject out (silent aspiration) Pharyngeal- Nectar Cup Delayed swallow initiation-pyriform sinuses;Reduced pharyngeal peristalsis;Reduced anterior laryngeal mobility;Reduced laryngeal elevation;Reduced airway/laryngeal closure;Reduced tongue base retraction;Penetration/Aspiration before swallow;Penetration/Aspiration during swallow;Pharyngeal residue - valleculae Pharyngeal Material enters airway, passes BELOW cords without attempt by patient to eject out (silent aspiration) Pharyngeal- Nectar Straw -- Pharyngeal -- Pharyngeal- Thin Teaspoon Delayed swallow initiation-pyriform sinuses;Reduced pharyngeal peristalsis;Reduced anterior laryngeal mobility;Reduced laryngeal elevation;Reduced airway/laryngeal closure;Reduced tongue base retraction;Penetration/Aspiration during swallow;Pharyngeal residue - valleculae Pharyngeal Material enters airway, CONTACTS cords and not ejected out Pharyngeal- Thin Cup Reduced pharyngeal peristalsis;Delayed swallow initiation-pyriform sinuses;Reduced anterior laryngeal mobility;Reduced laryngeal elevation;Reduced airway/laryngeal closure;Reduced tongue base retraction;Penetration/Aspiration during  swallow;Pharyngeal residue - valleculae Pharyngeal Material enters  airway, passes BELOW cords and not ejected out despite cough attempt by patient Pharyngeal- Thin Straw -- Pharyngeal -- Pharyngeal- Puree Delayed swallow initiation-vallecula;Reduced pharyngeal peristalsis;Reduced anterior laryngeal mobility;Reduced laryngeal elevation;Reduced tongue base retraction;Pharyngeal residue - valleculae Pharyngeal -- Pharyngeal- Mechanical Soft -- Pharyngeal -- Pharyngeal- Regular -- Pharyngeal -- Pharyngeal- Multi-consistency Delayed swallow initiation-vallecula;Reduced pharyngeal peristalsis;Reduced anterior laryngeal mobility;Reduced laryngeal elevation;Reduced tongue base retraction;Pharyngeal residue - valleculae Pharyngeal -- Pharyngeal- Pill -- Pharyngeal -- Pharyngeal Comment --  CHL IP CERVICAL ESOPHAGEAL PHASE 06/03/2018 Cervical Esophageal Phase WFL Pudding Teaspoon -- Pudding Cup -- Honey Teaspoon -- Honey Cup -- Nectar Teaspoon -- Nectar Cup -- Nectar Straw -- Thin Teaspoon -- Thin Cup -- Thin Straw -- Puree -- Mechanical Soft -- Regular -- Multi-consistency -- Pill -- Cervical Esophageal Comment -- Shelly Flatten, MA, CCC-SLP Acute Rehab SLP [phone] Lamar Sprinkles 06/03/2018, 1:56 PM              Ir Embo Art  Katie Guide Roadmapping  Result Date: 05/20/2018 INDICATION: Acute life-threatening upper GI bleed secondary to gastric/duodenal ulcer. EXAM: 1. ULTRASOUND GUIDANCE FOR ARTERIAL ACCESS 2. SELECTIVE SUPERIOR MESENTERIC ARTERIOGRAM 3. SELECTIVE CELIAC ARTERIOGRAM 4. SELECTIVE COMMON HEPATIC ARTERIOGRAM 5. SELECTIVE GASTRODUODENAL ARTERIOGRAM AND PERCUTANEOUS COIL EMBOLIZATION MEDICATIONS: None ANESTHESIA/SEDATION: Moderate (conscious) sedation was employed during this procedure. A total of Fentanyl 50 mcg was administered intravenously. Moderate Sedation Time: 30 minutes. The patient's level of consciousness and vital signs were monitored continuously by radiology nursing  throughout the procedure under my direct supervision. CONTRAST:  50 cc Isovue-300 FLUOROSCOPY TIME:  6 minutes, 6 seconds (527 mGy) COMPLICATIONS: None immediate. PROCEDURE: Informed consent was obtained from the patient's family following explanation of the procedure, risks, benefits and alternatives. The patient understands, agrees and consents for the procedure. All questions were addressed. A time out was performed prior to the initiation of the procedure. Maximal barrier sterile technique utilized including caps, mask, sterile gowns, sterile gloves, large sterile drape, hand hygiene, and Betadine prep. Attempts were made by the ICU staff to obtain a right femoral arterial line however this ultimately proved unsuccessful. Given the failed attempts, the decision was made to access the left common femoral artery. The left femoral head was marked fluoroscopically. Under ultrasound guidance, the left common femoral artery was accessed with a micropuncture kit after the overlying soft tissues were anesthetized with 1% lidocaine. An ultrasound image was saved for documentation purposes. The micropuncture sheath was exchanged for a 5 [place] vascular sheath over a Bentson wire. A closure arteriogram was performed through the side of the sheath confirming access within the right common femoral artery. Over a Bentson wire, a Mickelson catheter was advanced to the level of the thoracic aorta where it was back bled and flushed. The catheter was then utilized to select the celiac artery and a selective celiac arteriogram was performed The Mickelson catheter was then utilized to select the superior mesenteric artery and a selective superior mesenteric arteriogram was performed. Again, the Mickelson catheter was utilized to select the celiac artery and a selective celiac arteriogram was performed. Next, with the use of a fathom 14 wire, a regular Renegade microcatheter was advanced into the gastroduodenal artery and a sub  selective gastroduodenal arteriogram was performed. A GDA was then coil embolized from its distal aspect to near its origin with multiple overlapping 2, 3 and 4 mm diameter interlock coils. The microcatheter was retracted into the common hepatic artery and a selective common hepatic arteriogram was performed. Next, the  microcatheter was removed and a repeat celiac arteriogram was performed via the [facility] catheter. Finally the [facility] catheter was utilized to select the superior mesenteric artery and a selective superior mesenteric arteriogram was performed. Images were reviewed and the procedure was terminated. All wires and catheters removed from the patient. The left common femoral artery vascular sheath was sutured in place for continued arterial monitoring as per the ICU staff. Dressings were placed. FINDINGS: Celiac arteriogram demonstrates conventional branching pattern without definitive area of vessel irregularity or active extravasation. Selective gastroduodenal arteriogram demonstrates hyperemia at the expected location of the duodenal bulb without discrete area active extravasation or vessel irregularity. Following empiric percutaneous coil embolization, there is complete embolization of the gastroduodenal artery. Superior mesenteric arteriogram was negative for definitive retrograde contribution to the gastroduodenal arterial tree. IMPRESSION: Technically successful inferior coil embolization of the gastroduodenal artery for life-threatening upper GI bleed due to gastric/duodenal ulcer. Electronically Signed   By: Sandi Mariscal M.D.   On: 05/20/2018 09:18   Vas [place] Lower Extremity Venous (dvt)  Result Date: 06/02/2018  Lower Venous Study Indications: Rule out DVT. Other Indications: Persistent fever. Limitations: Multiple line on sites and severe edema; immobility. Comparison Study: No prior study available. Performing Technologist: Rudell Cobb  Examination Guidelines: A complete evaluation  includes B-mode imaging, spectral Doppler, color Doppler, and power Doppler as needed of all accessible portions of each vessel. Bilateral testing is considered an integral part of a complete examination. Limited examinations for reoccurring indications may be performed as noted.  Right Venous Findings: +---------+---------------+---------+-----------+----------------+-------------+          CompressibilityPhasicitySpontaneityProperties      Summary       +---------+---------------+---------+-----------+----------------+-------------+ CFV      None           Yes      Yes        partially       Acute                                                     re-cannalized                 +---------+---------------+---------+-----------+----------------+-------------+ SFJ      None                                                             +---------+---------------+---------+-----------+----------------+-------------+ FV Prox  None                               partially                                                                 re-cannalized                 +---------+---------------+---------+-----------+----------------+-------------+ FV Mid   Full                                                             +---------+---------------+---------+-----------+----------------+-------------+  FV DistalFull                                                             +---------+---------------+---------+-----------+----------------+-------------+ PFV      Full                                                             +---------+---------------+---------+-----------+----------------+-------------+ POP      Full           Yes      Yes                                      +---------+---------------+---------+-----------+----------------+-------------+ PTV                                                         not well                                                                   visualized    +---------+---------------+---------+-----------+----------------+-------------+ PERO                                                        not well                                                                  visualized    +---------+---------------+---------+-----------+----------------+-------------+ Gastroc  None           No       No                         Acute         +---------+---------------+---------+-----------+----------------+-------------+  Left Venous Findings: +---------+---------------+---------+-----------+----------+-------------------+          CompressibilityPhasicitySpontaneityPropertiesSummary             +---------+---------------+---------+-----------+----------+-------------------+ CFV      Full           Yes      Yes                                      +---------+---------------+---------+-----------+----------+-------------------+ SFJ  Full                                                             +---------+---------------+---------+-----------+----------+-------------------+ FV Prox  Full                                                             +---------+---------------+---------+-----------+----------+-------------------+ FV Mid   Full                                                             +---------+---------------+---------+-----------+----------+-------------------+ FV DistalFull                                                             +---------+---------------+---------+-----------+----------+-------------------+ PFV      Full                                                             +---------+---------------+---------+-----------+----------+-------------------+ POP      Full           Yes      Yes                                      +---------+---------------+---------+-----------+----------+-------------------+ PTV                                                    not well visualized +---------+---------------+---------+-----------+----------+-------------------+ PERO                                                  not well visualized +---------+---------------+---------+-----------+----------+-------------------+    Summary: Right: Findings consistent with acute deep vein thrombosis involving the right common femoral vein, right femoral vein, and right gastrocnemius vein. No cystic structure found in the popliteal fossa. Left: There is no evidence of deep vein thrombosis in the lower extremity. However, portions of this examination were limited- see technologist comments above. No cystic structure found in the popliteal fossa.  *See table(s) above for measurements and observations. Electronically signed by Servando Snare MD on 06/02/2018 at 1:27:35 PM.    Final    Vas [place] Upper Extremity Venous Duplex  Result Date: 06/02/2018 UPPER VENOUS STUDY  Indications: Rule out DVT Other Indications: Persistent fever. Limitations: Multiple line and bandages on site; Immobility; Severe edema. Comparison Study: No comparison study available. Performing Technologist: Rudell Cobb  Examination Guidelines: A complete evaluation includes B-mode imaging, spectral Doppler, color Doppler, and power Doppler as needed of all accessible portions of each vessel. Bilateral testing is considered an integral part of a complete examination. Limited examinations for reoccurring indications may be performed as noted.  Right Findings: +----------+------------+----------+---------+-----------+--------------+ RIGHT     CompressiblePropertiesPhasicitySpontaneous   Summary     +----------+------------+----------+---------+-----------+--------------+ IJV           Full                 Yes       Yes                   +----------+------------+----------+---------+-----------+--------------+ Subclavian    Full                 Yes        Yes                   +----------+------------+----------+---------+-----------+--------------+ Axillary      Full                 Yes       Yes                   +----------+------------+----------+---------+-----------+--------------+ Brachial      Full                 Yes       Yes                   +----------+------------+----------+---------+-----------+--------------+ Radial        Full                                                 +----------+------------+----------+---------+-----------+--------------+ Ulnar         Full                                                 +----------+------------+----------+---------+-----------+--------------+ Cephalic                                            Not visualized +----------+------------+----------+---------+-----------+--------------+ Basilic     Partial                                     Acute      +----------+------------+----------+---------+-----------+--------------+  Left Findings: +----------+------------+----------+---------+-----------+-------------+ LEFT      CompressiblePropertiesPhasicitySpontaneous   Summary    +----------+------------+----------+---------+-----------+-------------+ IJV           Full                 Yes       Yes                  +----------+------------+----------+---------+-----------+-------------+ Subclavian  PICC on site. +----------+------------+----------+---------+-----------+-------------+ Axillary                           Yes       Yes                  +----------+------------+----------+---------+-----------+-------------+ Brachial      Full                 Yes       Yes                  +----------+------------+----------+---------+-----------+-------------+ Radial        Full                                                 +----------+------------+----------+---------+-----------+-------------+ Ulnar       Partial                                     Acute     +----------+------------+----------+---------+-----------+-------------+ Cephalic    Partial                                     Acute     +----------+------------+----------+---------+-----------+-------------+ Basilic                            No        No     PICC on site. +----------+------------+----------+---------+-----------+-------------+  Summary:  Right: No evidence of deep vein thrombosis in the upper extremity. No evidence of thrombosis in the subclavian. Findings consistent with acute superficial vein thrombosis involving the right basilic vein.  Left: Findings consistent with acute deep vein thrombosis involving the left ulnar veins. Findings consistent with acute superficial vein thrombosis involving the left basilic vein and left cephalic vein.  *See table(s) above for measurements and observations.  Diagnosing physician: Servando Snare MD Electronically signed by Servando Snare MD on 06/02/2018 at 1:24:42 PM.    Final    [place] Ekg Site Rite  Result Date: 05/25/2018 If [facility] image not attached, placement could not be confirmed due to current cardiac rhythm.  [place] Ekg Site Rite  Result Date: 05/23/2018 If Site Rite image not attached, placement could not be confirmed due to current cardiac rhythm.  US Abdomen Limited Ruq  Result Date: 06/01/2018 CLINICAL DATA:  Fever. Elevated liver enzymes. Elevated white cell count. EXAM: ULTRASOUND ABDOMEN LIMITED RIGHT UPPER QUADRANT COMPARISON:  None. FINDINGS: Gallbladder: No gallstones or wall thickening visualized. No sonographic Murphy sign noted by sonographer. Common bile duct: Diameter: 2.7 mm, normal Liver: No focal lesion identified. Within normal limits in parenchymal echogenicity. Portal vein is patent on color Doppler imaging with normal direction of blood flow towards the liver.  IMPRESSION: Normal examination. No evidence of cholelithiasis or acute cholecystitis. Electronically Signed   By: Lucienne Capers M.D.   On: 06/01/2018 22:26    Micro Results    Recent Results (from the past 240 hour(s))  Culture, respiratory (non-expectorated)     Status: None   Collection Time: 05/29/18  1:54 PM  Result Value Ref Range Status   Specimen Description TRACHEAL ASPIRATE  Final   Special Requests NONE  Final   Gram Stain   Final    ABUNDANT WBC PRESENT, PREDOMINANTLY MONONUCLEAR RARE SQUAMOUS EPITHELIAL CELLS PRESENT FEW GRAM POSITIVE COCCI    Culture   Final    FEW Consistent with normal respiratory flora. Performed at [facility], [place] [address] [place], [place] [address]    Report Status 06/01/2018 FINAL  Final  Culture, respiratory (non-expectorated)     Status: None   Collection Time: 06/01/18 12:04 PM  Result Value Ref Range Status   Specimen Description TRACHEAL ASPIRATE  Final   Special Requests NONE  Final   Gram Stain NO WBC SEEN NO ORGANISMS SEEN   Final   Culture   Final    RARE Consistent with normal respiratory flora. Performed at [facility], [place] [address] [place], [place] [address]    Report Status 06/03/2018 FINAL  Final  Culture, Urine     Status: None   Collection Time: 06/01/18  2:26 PM  Result Value Ref Range Status   Specimen Description URINE, RANDOM  Final   Special Requests NONE  Final   Culture   Final    NO GROWTH Performed at [facility], [place] [address], [place], [place] [address]    Report Status 06/02/2018 FINAL  Final  Culture, blood (routine x 2)     Status: None   Collection Time: 06/01/18 11:08 PM  Result Value Ref Range Status   Specimen Description BLOOD RIGHT FOREARM  Final   Special Requests   Final    BOTTLES DRAWN AEROBIC ONLY Blood Culture adequate volume   Culture   Final    NO GROWTH 5 DAYS Performed at [facility], [place] [address], [place], [place] [address]    Report Status  06/07/2018 FINAL  Final  Culture, blood (routine x 2)     Status: None   Collection Time: 06/01/18 11:15 PM  Result Value Ref Range Status   Specimen Description BLOOD RIGHT ARM  Final   Special Requests   Final    BOTTLES DRAWN AEROBIC ONLY Blood Culture adequate volume   Culture   Final    NO GROWTH 5 DAYS Performed at [facility], [place] [address], [place], [place] [address]    Report Status 06/07/2018 FINAL  Final    Today   Subjective    Christine Ortega today has no headache,no chest abdominal pain,no new weakness tingling or numbness, feels much better wants to go home today.     Objective   Blood pressure (!) 144/80, pulse (!) 110, temperature 97.9 F (36.6 C), temperature source Oral, resp. rate 20, height '5\' 3"'  (1.6 m), weight 56.1 kg, SpO2 96 %.   Intake/Output Summary (Last 24 hours) at 06/08/2018 1057 Last data filed at 06/07/2018 1532 Gross per 24 hour  Intake 580 ml  Output 200 ml  Net 380 ml    Exam  Awake Alert, Oriented x 3, No new F.N deficits, Normal affect [place].AT,PERRAL Supple Neck,No JVD, No cervical lymphadenopathy appriciated.  Symmetrical Chest wall movement, Good air movement bilaterally, CTAB RRR,No Gallops,Rubs or new Murmurs, No Parasternal Heave +ve B.Sounds, Abd Soft, Non tender, No organomegaly appriciated, No rebound -guarding or rigidity. No Cyanosis, Clubbing or edema, No new Rash or bruise   Data Review   CBC w Diff:  Lab Results  Component Value Date   WBC 18.4 (H) 06/07/2018   HGB 11.2 (L) 06/07/2018  HCT 36.2 06/07/2018   PLT 966 (HH) 06/07/2018   LYMPHOPCT 18 05/30/2018   MONOPCT 13 05/30/2018   EOSPCT 3 05/30/2018   BASOPCT 1 05/30/2018    CMP:  Lab Results  Component Value Date   NA 137 06/07/2018   K 4.1 06/07/2018   CL 98 06/07/2018   CO2 22 06/07/2018   BUN 13 06/07/2018   CREATININE 0.63 06/07/2018   PROT 6.6 06/05/2018   ALBUMIN 2.7 (L) 06/05/2018   BILITOT 0.4 06/05/2018   ALKPHOS 90 06/05/2018    AST 35 06/05/2018   ALT 31 06/05/2018  .   Total Time in preparing paper work, data evaluation and todays exam - 58 minutes  Lala Lund M.D on 06/08/2018 at 10:57 AM  [facility]   Office  [phone]

## 2018-06-08 NOTE — Discharge Instructions (Signed)
Follow with Primary MD Default, Provider, MD in 7 days   Get CBC, CMP, 2 view Chest X ray -  checked  by Primary MD or SNF MD in 5-7 days    Activity: As tolerated with Full fall precautions use walker/cane & assistance as needed  Disposition Home   Diet: Soft diet with feeding assistance and aspiration precautions.  Special Instructions: If you have smoked or chewed Tobacco  in the last 2 yrs please stop smoking, stop any regular Alcohol  and or any Recreational drug use.  Information on my medicine - ELIQUIS (apixaban)   Why was Eliquis prescribed for you? Eliquis was prescribed to treat blood clots that may have been found in the veins of your legs (deep vein thrombosis) or in your lungs (pulmonary embolism) and to reduce the risk of them occurring again.  What do You need to know about Eliquis ? The starting dose is 10 mg (two 5 mg tablets) taken TWICE daily for the FIRST SEVEN (7) DAYS,  the dose is reduced to ONE 5 mg tablet taken TWICE daily.  Eliquis may be taken with or without food.   Try to take the dose about the same time in the morning and in the evening. If you have difficulty swallowing the tablet whole please discuss with your pharmacist how to take the medication safely.  Take Eliquis exactly as prescribed and DO NOT stop taking Eliquis without talking to the doctor who prescribed the medication.  Stopping may increase your risk of developing a new blood clot.  Refill your prescription before you run out.  After discharge, you should have regular check-up appointments with your healthcare provider that is prescribing your Eliquis.    What do you do if you miss a dose? If a dose of ELIQUIS is not taken at the scheduled time, take it as soon as possible on the same day and twice-daily administration should be resumed. The dose should not be doubled to make up for a missed dose.  Important Safety Information A possible side effect of Eliquis is bleeding. You  should call your healthcare provider right away if you experience any of the following: ? Bleeding from an injury or your nose that does not stop. ? Unusual colored urine (red or dark brown) or unusual colored stools (red or black). ? Unusual bruising for unknown reasons. ? A serious fall or if you hit your head (even if there is no bleeding).  Some medicines may interact with Eliquis and might increase your risk of bleeding or clotting while on Eliquis. To help avoid this, consult your healthcare provider or pharmacist prior to using any new prescription or non-prescription medications, including herbals, vitamins, non-steroidal anti-inflammatory drugs (NSAIDs) and supplements.  This website has more information on Eliquis (apixaban): http://www.eliquis.com/eliquis/home   On your next visit with your primary care physician please Get Medicines reviewed and adjusted.  Please request your Prim.MD to go over all Hospital Tests and Procedure/Radiological results at the follow up, please get all Hospital records sent to your Prim MD by signing hospital release before you go home.  If you experience worsening of your admission symptoms, develop shortness of breath, life threatening emergency, suicidal or homicidal thoughts you must seek medical attention immediately by calling 911 or calling your MD immediately  if symptoms less severe.  You Must read complete instructions/literature along with all the possible adverse reactions/side effects for all the Medicines you take and that have been prescribed to you. Take any  new Medicines after you have completely understood and accpet all the possible adverse reactions/side effects.

## 2018-06-14 DIAGNOSIS — K259 Gastric ulcer, unspecified as acute or chronic, without hemorrhage or perforation: Secondary | ICD-10-CM | POA: Diagnosis not present

## 2018-06-14 DIAGNOSIS — A4151 Sepsis due to Escherichia coli [E. coli]: Secondary | ICD-10-CM | POA: Diagnosis not present

## 2018-06-14 DIAGNOSIS — I82401 Acute embolism and thrombosis of unspecified deep veins of right lower extremity: Secondary | ICD-10-CM | POA: Diagnosis not present

## 2018-06-14 DIAGNOSIS — D62 Acute posthemorrhagic anemia: Secondary | ICD-10-CM | POA: Diagnosis not present

## 2018-06-14 DIAGNOSIS — Z9181 History of falling: Secondary | ICD-10-CM | POA: Diagnosis not present

## 2018-06-14 DIAGNOSIS — R1310 Dysphagia, unspecified: Secondary | ICD-10-CM | POA: Diagnosis not present

## 2018-06-14 DIAGNOSIS — D649 Anemia, unspecified: Secondary | ICD-10-CM | POA: Diagnosis not present

## 2018-06-14 DIAGNOSIS — J189 Pneumonia, unspecified organism: Secondary | ICD-10-CM | POA: Diagnosis not present

## 2018-06-14 DIAGNOSIS — N390 Urinary tract infection, site not specified: Secondary | ICD-10-CM | POA: Diagnosis not present

## 2018-06-14 DIAGNOSIS — Z7901 Long term (current) use of anticoagulants: Secondary | ICD-10-CM | POA: Diagnosis not present

## 2018-06-14 DIAGNOSIS — M21379 Foot drop, unspecified foot: Secondary | ICD-10-CM | POA: Diagnosis not present

## 2018-06-14 DIAGNOSIS — G8929 Other chronic pain: Secondary | ICD-10-CM | POA: Diagnosis not present

## 2018-06-14 DIAGNOSIS — R1312 Dysphagia, oropharyngeal phase: Secondary | ICD-10-CM | POA: Diagnosis not present

## 2018-06-14 DIAGNOSIS — K922 Gastrointestinal hemorrhage, unspecified: Secondary | ICD-10-CM | POA: Diagnosis not present

## 2018-06-14 DIAGNOSIS — I82621 Acute embolism and thrombosis of deep veins of right upper extremity: Secondary | ICD-10-CM | POA: Diagnosis not present

## 2018-06-15 DIAGNOSIS — I82621 Acute embolism and thrombosis of deep veins of right upper extremity: Secondary | ICD-10-CM | POA: Diagnosis not present

## 2018-06-15 DIAGNOSIS — I82401 Acute embolism and thrombosis of unspecified deep veins of right lower extremity: Secondary | ICD-10-CM | POA: Diagnosis not present

## 2018-06-15 DIAGNOSIS — D649 Anemia, unspecified: Secondary | ICD-10-CM | POA: Diagnosis not present

## 2018-06-15 DIAGNOSIS — A4151 Sepsis due to Escherichia coli [E. coli]: Secondary | ICD-10-CM | POA: Diagnosis not present

## 2018-06-15 DIAGNOSIS — M21379 Foot drop, unspecified foot: Secondary | ICD-10-CM | POA: Diagnosis not present

## 2018-06-15 DIAGNOSIS — J189 Pneumonia, unspecified organism: Secondary | ICD-10-CM | POA: Diagnosis not present

## 2018-06-15 DIAGNOSIS — R1312 Dysphagia, oropharyngeal phase: Secondary | ICD-10-CM | POA: Diagnosis not present

## 2018-06-15 DIAGNOSIS — Z9181 History of falling: Secondary | ICD-10-CM | POA: Diagnosis not present

## 2018-06-15 DIAGNOSIS — N390 Urinary tract infection, site not specified: Secondary | ICD-10-CM | POA: Diagnosis not present

## 2018-06-15 DIAGNOSIS — Z7901 Long term (current) use of anticoagulants: Secondary | ICD-10-CM | POA: Diagnosis not present

## 2018-06-15 DIAGNOSIS — R1310 Dysphagia, unspecified: Secondary | ICD-10-CM | POA: Diagnosis not present

## 2018-06-15 DIAGNOSIS — K259 Gastric ulcer, unspecified as acute or chronic, without hemorrhage or perforation: Secondary | ICD-10-CM | POA: Diagnosis not present

## 2018-06-15 DIAGNOSIS — G8929 Other chronic pain: Secondary | ICD-10-CM | POA: Diagnosis not present

## 2018-06-17 DIAGNOSIS — R6889 Other general symptoms and signs: Secondary | ICD-10-CM | POA: Diagnosis not present

## 2018-06-18 DIAGNOSIS — J690 Pneumonitis due to inhalation of food and vomit: Secondary | ICD-10-CM | POA: Diagnosis not present

## 2018-06-18 DIAGNOSIS — J181 Lobar pneumonia, unspecified organism: Secondary | ICD-10-CM | POA: Diagnosis not present

## 2018-06-18 DIAGNOSIS — G894 Chronic pain syndrome: Secondary | ICD-10-CM | POA: Diagnosis not present

## 2018-06-19 DIAGNOSIS — M21379 Foot drop, unspecified foot: Secondary | ICD-10-CM | POA: Diagnosis not present

## 2018-06-19 DIAGNOSIS — Z7901 Long term (current) use of anticoagulants: Secondary | ICD-10-CM | POA: Diagnosis not present

## 2018-06-19 DIAGNOSIS — A4151 Sepsis due to Escherichia coli [E. coli]: Secondary | ICD-10-CM | POA: Diagnosis not present

## 2018-06-19 DIAGNOSIS — R1310 Dysphagia, unspecified: Secondary | ICD-10-CM | POA: Diagnosis not present

## 2018-06-19 DIAGNOSIS — N390 Urinary tract infection, site not specified: Secondary | ICD-10-CM | POA: Diagnosis not present

## 2018-06-19 DIAGNOSIS — G8929 Other chronic pain: Secondary | ICD-10-CM | POA: Diagnosis not present

## 2018-06-19 DIAGNOSIS — J189 Pneumonia, unspecified organism: Secondary | ICD-10-CM | POA: Diagnosis not present

## 2018-06-19 DIAGNOSIS — I82621 Acute embolism and thrombosis of deep veins of right upper extremity: Secondary | ICD-10-CM | POA: Diagnosis not present

## 2018-06-19 DIAGNOSIS — K259 Gastric ulcer, unspecified as acute or chronic, without hemorrhage or perforation: Secondary | ICD-10-CM | POA: Diagnosis not present

## 2018-06-19 DIAGNOSIS — D649 Anemia, unspecified: Secondary | ICD-10-CM | POA: Diagnosis not present

## 2018-06-19 DIAGNOSIS — R1312 Dysphagia, oropharyngeal phase: Secondary | ICD-10-CM | POA: Diagnosis not present

## 2018-06-19 DIAGNOSIS — Z9181 History of falling: Secondary | ICD-10-CM | POA: Diagnosis not present

## 2018-06-19 DIAGNOSIS — I82401 Acute embolism and thrombosis of unspecified deep veins of right lower extremity: Secondary | ICD-10-CM | POA: Diagnosis not present

## 2018-06-20 DIAGNOSIS — N390 Urinary tract infection, site not specified: Secondary | ICD-10-CM | POA: Diagnosis not present

## 2018-06-20 DIAGNOSIS — J189 Pneumonia, unspecified organism: Secondary | ICD-10-CM | POA: Diagnosis not present

## 2018-06-20 DIAGNOSIS — Z9181 History of falling: Secondary | ICD-10-CM | POA: Diagnosis not present

## 2018-06-20 DIAGNOSIS — R1312 Dysphagia, oropharyngeal phase: Secondary | ICD-10-CM | POA: Diagnosis not present

## 2018-06-20 DIAGNOSIS — I82401 Acute embolism and thrombosis of unspecified deep veins of right lower extremity: Secondary | ICD-10-CM | POA: Diagnosis not present

## 2018-06-20 DIAGNOSIS — K259 Gastric ulcer, unspecified as acute or chronic, without hemorrhage or perforation: Secondary | ICD-10-CM | POA: Diagnosis not present

## 2018-06-20 DIAGNOSIS — A4151 Sepsis due to Escherichia coli [E. coli]: Secondary | ICD-10-CM | POA: Diagnosis not present

## 2018-06-20 DIAGNOSIS — G8929 Other chronic pain: Secondary | ICD-10-CM | POA: Diagnosis not present

## 2018-06-20 DIAGNOSIS — D649 Anemia, unspecified: Secondary | ICD-10-CM | POA: Diagnosis not present

## 2018-06-20 DIAGNOSIS — M21379 Foot drop, unspecified foot: Secondary | ICD-10-CM | POA: Diagnosis not present

## 2018-06-20 DIAGNOSIS — I82621 Acute embolism and thrombosis of deep veins of right upper extremity: Secondary | ICD-10-CM | POA: Diagnosis not present

## 2018-06-20 DIAGNOSIS — R1310 Dysphagia, unspecified: Secondary | ICD-10-CM | POA: Diagnosis not present

## 2018-06-20 DIAGNOSIS — Z7901 Long term (current) use of anticoagulants: Secondary | ICD-10-CM | POA: Diagnosis not present

## 2018-06-21 DIAGNOSIS — G8929 Other chronic pain: Secondary | ICD-10-CM | POA: Diagnosis not present

## 2018-06-21 DIAGNOSIS — A4151 Sepsis due to Escherichia coli [E. coli]: Secondary | ICD-10-CM | POA: Diagnosis not present

## 2018-06-21 DIAGNOSIS — I82401 Acute embolism and thrombosis of unspecified deep veins of right lower extremity: Secondary | ICD-10-CM | POA: Diagnosis not present

## 2018-06-21 DIAGNOSIS — Z7901 Long term (current) use of anticoagulants: Secondary | ICD-10-CM | POA: Diagnosis not present

## 2018-06-21 DIAGNOSIS — R1312 Dysphagia, oropharyngeal phase: Secondary | ICD-10-CM | POA: Diagnosis not present

## 2018-06-21 DIAGNOSIS — R1310 Dysphagia, unspecified: Secondary | ICD-10-CM | POA: Diagnosis not present

## 2018-06-21 DIAGNOSIS — Z9181 History of falling: Secondary | ICD-10-CM | POA: Diagnosis not present

## 2018-06-21 DIAGNOSIS — J189 Pneumonia, unspecified organism: Secondary | ICD-10-CM | POA: Diagnosis not present

## 2018-06-21 DIAGNOSIS — K259 Gastric ulcer, unspecified as acute or chronic, without hemorrhage or perforation: Secondary | ICD-10-CM | POA: Diagnosis not present

## 2018-06-21 DIAGNOSIS — N390 Urinary tract infection, site not specified: Secondary | ICD-10-CM | POA: Diagnosis not present

## 2018-06-21 DIAGNOSIS — D649 Anemia, unspecified: Secondary | ICD-10-CM | POA: Diagnosis not present

## 2018-06-21 DIAGNOSIS — M21379 Foot drop, unspecified foot: Secondary | ICD-10-CM | POA: Diagnosis not present

## 2018-06-21 DIAGNOSIS — I82621 Acute embolism and thrombosis of deep veins of right upper extremity: Secondary | ICD-10-CM | POA: Diagnosis not present

## 2018-06-22 DIAGNOSIS — I82621 Acute embolism and thrombosis of deep veins of right upper extremity: Secondary | ICD-10-CM | POA: Diagnosis not present

## 2018-06-22 DIAGNOSIS — J189 Pneumonia, unspecified organism: Secondary | ICD-10-CM | POA: Diagnosis not present

## 2018-06-22 DIAGNOSIS — I82401 Acute embolism and thrombosis of unspecified deep veins of right lower extremity: Secondary | ICD-10-CM | POA: Diagnosis not present

## 2018-06-22 DIAGNOSIS — J159 Unspecified bacterial pneumonia: Secondary | ICD-10-CM | POA: Diagnosis not present

## 2018-06-22 DIAGNOSIS — K259 Gastric ulcer, unspecified as acute or chronic, without hemorrhage or perforation: Secondary | ICD-10-CM | POA: Diagnosis not present

## 2018-06-22 DIAGNOSIS — Z9181 History of falling: Secondary | ICD-10-CM | POA: Diagnosis not present

## 2018-06-22 DIAGNOSIS — Z7901 Long term (current) use of anticoagulants: Secondary | ICD-10-CM | POA: Diagnosis not present

## 2018-06-22 DIAGNOSIS — G8929 Other chronic pain: Secondary | ICD-10-CM | POA: Diagnosis not present

## 2018-06-22 DIAGNOSIS — R1310 Dysphagia, unspecified: Secondary | ICD-10-CM | POA: Diagnosis not present

## 2018-06-22 DIAGNOSIS — A4151 Sepsis due to Escherichia coli [E. coli]: Secondary | ICD-10-CM | POA: Diagnosis not present

## 2018-06-22 DIAGNOSIS — N390 Urinary tract infection, site not specified: Secondary | ICD-10-CM | POA: Diagnosis not present

## 2018-06-22 DIAGNOSIS — M21379 Foot drop, unspecified foot: Secondary | ICD-10-CM | POA: Diagnosis not present

## 2018-06-22 DIAGNOSIS — R1312 Dysphagia, oropharyngeal phase: Secondary | ICD-10-CM | POA: Diagnosis not present

## 2018-06-22 DIAGNOSIS — R5382 Chronic fatigue, unspecified: Secondary | ICD-10-CM | POA: Diagnosis not present

## 2018-06-22 DIAGNOSIS — D649 Anemia, unspecified: Secondary | ICD-10-CM | POA: Diagnosis not present

## 2018-06-26 DIAGNOSIS — R1312 Dysphagia, oropharyngeal phase: Secondary | ICD-10-CM | POA: Diagnosis not present

## 2018-06-26 DIAGNOSIS — D649 Anemia, unspecified: Secondary | ICD-10-CM | POA: Diagnosis not present

## 2018-06-26 DIAGNOSIS — Z7901 Long term (current) use of anticoagulants: Secondary | ICD-10-CM | POA: Diagnosis not present

## 2018-06-26 DIAGNOSIS — K259 Gastric ulcer, unspecified as acute or chronic, without hemorrhage or perforation: Secondary | ICD-10-CM | POA: Diagnosis not present

## 2018-06-26 DIAGNOSIS — I82621 Acute embolism and thrombosis of deep veins of right upper extremity: Secondary | ICD-10-CM | POA: Diagnosis not present

## 2018-06-26 DIAGNOSIS — J189 Pneumonia, unspecified organism: Secondary | ICD-10-CM | POA: Diagnosis not present

## 2018-06-26 DIAGNOSIS — Z9181 History of falling: Secondary | ICD-10-CM | POA: Diagnosis not present

## 2018-06-26 DIAGNOSIS — M21379 Foot drop, unspecified foot: Secondary | ICD-10-CM | POA: Diagnosis not present

## 2018-06-26 DIAGNOSIS — G8929 Other chronic pain: Secondary | ICD-10-CM | POA: Diagnosis not present

## 2018-06-26 DIAGNOSIS — I82401 Acute embolism and thrombosis of unspecified deep veins of right lower extremity: Secondary | ICD-10-CM | POA: Diagnosis not present

## 2018-06-26 DIAGNOSIS — A4151 Sepsis due to Escherichia coli [E. coli]: Secondary | ICD-10-CM | POA: Diagnosis not present

## 2018-06-26 DIAGNOSIS — N390 Urinary tract infection, site not specified: Secondary | ICD-10-CM | POA: Diagnosis not present

## 2018-06-26 DIAGNOSIS — R1310 Dysphagia, unspecified: Secondary | ICD-10-CM | POA: Diagnosis not present

## 2018-06-27 DIAGNOSIS — R05 Cough: Secondary | ICD-10-CM | POA: Diagnosis not present

## 2018-06-27 DIAGNOSIS — J690 Pneumonitis due to inhalation of food and vomit: Secondary | ICD-10-CM | POA: Diagnosis not present

## 2018-06-27 DIAGNOSIS — R0781 Pleurodynia: Secondary | ICD-10-CM | POA: Diagnosis not present

## 2018-06-28 DIAGNOSIS — Z7901 Long term (current) use of anticoagulants: Secondary | ICD-10-CM | POA: Diagnosis not present

## 2018-06-28 DIAGNOSIS — G8929 Other chronic pain: Secondary | ICD-10-CM | POA: Diagnosis not present

## 2018-06-28 DIAGNOSIS — R1310 Dysphagia, unspecified: Secondary | ICD-10-CM | POA: Diagnosis not present

## 2018-06-28 DIAGNOSIS — N390 Urinary tract infection, site not specified: Secondary | ICD-10-CM | POA: Diagnosis not present

## 2018-06-28 DIAGNOSIS — M21379 Foot drop, unspecified foot: Secondary | ICD-10-CM | POA: Diagnosis not present

## 2018-06-28 DIAGNOSIS — Z9181 History of falling: Secondary | ICD-10-CM | POA: Diagnosis not present

## 2018-06-28 DIAGNOSIS — I82401 Acute embolism and thrombosis of unspecified deep veins of right lower extremity: Secondary | ICD-10-CM | POA: Diagnosis not present

## 2018-06-28 DIAGNOSIS — J189 Pneumonia, unspecified organism: Secondary | ICD-10-CM | POA: Diagnosis not present

## 2018-06-28 DIAGNOSIS — R1312 Dysphagia, oropharyngeal phase: Secondary | ICD-10-CM | POA: Diagnosis not present

## 2018-06-28 DIAGNOSIS — I82621 Acute embolism and thrombosis of deep veins of right upper extremity: Secondary | ICD-10-CM | POA: Diagnosis not present

## 2018-06-28 DIAGNOSIS — A4151 Sepsis due to Escherichia coli [E. coli]: Secondary | ICD-10-CM | POA: Diagnosis not present

## 2018-06-28 DIAGNOSIS — D649 Anemia, unspecified: Secondary | ICD-10-CM | POA: Diagnosis not present

## 2018-06-28 DIAGNOSIS — K259 Gastric ulcer, unspecified as acute or chronic, without hemorrhage or perforation: Secondary | ICD-10-CM | POA: Diagnosis not present

## 2018-06-29 DIAGNOSIS — M21379 Foot drop, unspecified foot: Secondary | ICD-10-CM | POA: Diagnosis not present

## 2018-06-29 DIAGNOSIS — N390 Urinary tract infection, site not specified: Secondary | ICD-10-CM | POA: Diagnosis not present

## 2018-06-29 DIAGNOSIS — Z9181 History of falling: Secondary | ICD-10-CM | POA: Diagnosis not present

## 2018-06-29 DIAGNOSIS — Z7901 Long term (current) use of anticoagulants: Secondary | ICD-10-CM | POA: Diagnosis not present

## 2018-06-29 DIAGNOSIS — I82621 Acute embolism and thrombosis of deep veins of right upper extremity: Secondary | ICD-10-CM | POA: Diagnosis not present

## 2018-06-29 DIAGNOSIS — J189 Pneumonia, unspecified organism: Secondary | ICD-10-CM | POA: Diagnosis not present

## 2018-06-29 DIAGNOSIS — R1310 Dysphagia, unspecified: Secondary | ICD-10-CM | POA: Diagnosis not present

## 2018-06-29 DIAGNOSIS — I82401 Acute embolism and thrombosis of unspecified deep veins of right lower extremity: Secondary | ICD-10-CM | POA: Diagnosis not present

## 2018-06-29 DIAGNOSIS — R1312 Dysphagia, oropharyngeal phase: Secondary | ICD-10-CM | POA: Diagnosis not present

## 2018-06-29 DIAGNOSIS — G8929 Other chronic pain: Secondary | ICD-10-CM | POA: Diagnosis not present

## 2018-06-29 DIAGNOSIS — D649 Anemia, unspecified: Secondary | ICD-10-CM | POA: Diagnosis not present

## 2018-06-29 DIAGNOSIS — A4151 Sepsis due to Escherichia coli [E. coli]: Secondary | ICD-10-CM | POA: Diagnosis not present

## 2018-06-29 DIAGNOSIS — K259 Gastric ulcer, unspecified as acute or chronic, without hemorrhage or perforation: Secondary | ICD-10-CM | POA: Diagnosis not present

## 2018-07-01 NOTE — Progress Notes (Deleted)
   Subjective:    Patient ID: Christine Ortega, female    DOB: Aug 08, 1960, 58 y.o.   MRN: [id number]  Admit date: 05/19/2018  Discharge date: 06/08/2018  Admitted From: Home Disposition:  Home ( refused SNF)   Recommendations for Outpatient Follow-up:   Follow up with PCP in 1-2 weeks  PCP Please obtain BMP/CBC, 2 view CXR in 1week,  (see Discharge instructions)   PCP Please follow up on the following pending results:    Home Health: PT,RN,SLP   Equipment/Devices: DME walker, 3 and 1 device Consultations: PCCM, GI Discharge Condition: Stable   CODE STATUS: Full   Diet Recommendation: Soft    Chief Complaint Patient presents with . Influenza    Brief history of present illness from the day of admission and additional interim summary   58 year old with UGI bleed intubated for endoscopy s/p IR embolization of gastroduodenal artery 2/16 for large bleeding gastric ulcer. She had bilateral infiltrates and 3 L oxygen requirement on admission, presumptively diagnosed as community-acquired pneumonia. Intubated. Ecoli UTI.                                                                  Hospital Course  Acute severe UGIB with profound acute blood loss related anemia. Seen by GI, EGD showed bleeding gastric ulcer likely caused by NSAID use, status post embolization of GDA by IR. Currently no ongoing bleed, in the past received 6 unit of packed RBC in ICU. Continue PPI and follow with [facility] outpatient in 1 to 2 weeks, PCP to monitor H&H. H. pylori test was equivocal.  Profound acute blood loss anemia, stable- Status post 6 units of packed red blood cells since admission -hemoglobin appears to be stable at this time even on IV heparin & now Eliquis.  Right lower extremity DVT, left upper extremity DVT- Eliquis now.  ARDS - TRALI - Hypoxemic respiratory failure, Recovering with patient stable now on minimal O2 support. Encouraged to increase activity, sit up in  chair, flutter valve &I-S added. This problem has resolved stable on room air currently.  Acute Encephalopathy / Delirium - Move out of ICU -PT/OT -avoid sedatives as able. Improved.  E coli UTI - Has completed a course of abx tx.  ADD, bipolar, depression, schizophrenia- Cont regimen.  Not suicidal homicidal.  Monitor and follow-up with PCP.  QTc prolongation- resolved last QTC is 395 ms.  Foot drop - foot drop boots in place, PT, placement.  Constipation - Continue bowel regimen  Leukocytosis - Chronic, stable.  ECP to monitor as appropriate.  Dysphagia- speech following currently on dysphagia 1 diet, continue dysphagia 1 diet for now home speech therapy ordered upon discharge.        Review of Systems     Objective:   Physical Exam        Assessment & Plan:

## 2018-07-02 ENCOUNTER — Inpatient Hospital Stay: Payer: Self-pay | Admitting: Critical Care Medicine

## 2018-07-02 DIAGNOSIS — I110 Hypertensive heart disease with heart failure: Secondary | ICD-10-CM | POA: Diagnosis not present

## 2018-07-02 DIAGNOSIS — J159 Unspecified bacterial pneumonia: Secondary | ICD-10-CM | POA: Diagnosis not present

## 2018-07-02 DIAGNOSIS — M2550 Pain in unspecified joint: Secondary | ICD-10-CM | POA: Diagnosis not present

## 2018-07-02 DIAGNOSIS — K219 Gastro-esophageal reflux disease without esophagitis: Secondary | ICD-10-CM | POA: Diagnosis not present

## 2018-07-03 DIAGNOSIS — M21379 Foot drop, unspecified foot: Secondary | ICD-10-CM | POA: Diagnosis not present

## 2018-07-03 DIAGNOSIS — D649 Anemia, unspecified: Secondary | ICD-10-CM | POA: Diagnosis not present

## 2018-07-03 DIAGNOSIS — R5381 Other malaise: Secondary | ICD-10-CM | POA: Diagnosis not present

## 2018-07-03 DIAGNOSIS — R062 Wheezing: Secondary | ICD-10-CM | POA: Diagnosis not present

## 2018-07-03 DIAGNOSIS — I82401 Acute embolism and thrombosis of unspecified deep veins of right lower extremity: Secondary | ICD-10-CM | POA: Diagnosis not present

## 2018-07-03 DIAGNOSIS — D62 Acute posthemorrhagic anemia: Secondary | ICD-10-CM | POA: Diagnosis not present

## 2018-07-03 DIAGNOSIS — R1310 Dysphagia, unspecified: Secondary | ICD-10-CM | POA: Diagnosis not present

## 2018-07-03 DIAGNOSIS — G8929 Other chronic pain: Secondary | ICD-10-CM | POA: Diagnosis not present

## 2018-07-03 DIAGNOSIS — A4151 Sepsis due to Escherichia coli [E. coli]: Secondary | ICD-10-CM | POA: Diagnosis not present

## 2018-07-03 DIAGNOSIS — J189 Pneumonia, unspecified organism: Secondary | ICD-10-CM | POA: Diagnosis not present

## 2018-07-03 DIAGNOSIS — J690 Pneumonitis due to inhalation of food and vomit: Secondary | ICD-10-CM | POA: Diagnosis not present

## 2018-07-03 DIAGNOSIS — R1312 Dysphagia, oropharyngeal phase: Secondary | ICD-10-CM | POA: Diagnosis not present

## 2018-07-03 DIAGNOSIS — Z9181 History of falling: Secondary | ICD-10-CM | POA: Diagnosis not present

## 2018-07-03 DIAGNOSIS — R05 Cough: Secondary | ICD-10-CM | POA: Diagnosis not present

## 2018-07-03 DIAGNOSIS — I82621 Acute embolism and thrombosis of deep veins of right upper extremity: Secondary | ICD-10-CM | POA: Diagnosis not present

## 2018-07-03 DIAGNOSIS — K259 Gastric ulcer, unspecified as acute or chronic, without hemorrhage or perforation: Secondary | ICD-10-CM | POA: Diagnosis not present

## 2018-07-03 DIAGNOSIS — N390 Urinary tract infection, site not specified: Secondary | ICD-10-CM | POA: Diagnosis not present

## 2018-07-03 DIAGNOSIS — Z7901 Long term (current) use of anticoagulants: Secondary | ICD-10-CM | POA: Diagnosis not present

## 2018-07-05 DIAGNOSIS — R05 Cough: Secondary | ICD-10-CM | POA: Diagnosis not present

## 2018-07-06 DIAGNOSIS — A4151 Sepsis due to Escherichia coli [E. coli]: Secondary | ICD-10-CM | POA: Diagnosis not present

## 2018-07-06 DIAGNOSIS — D649 Anemia, unspecified: Secondary | ICD-10-CM | POA: Diagnosis not present

## 2018-07-06 DIAGNOSIS — R1312 Dysphagia, oropharyngeal phase: Secondary | ICD-10-CM | POA: Diagnosis not present

## 2018-07-06 DIAGNOSIS — K259 Gastric ulcer, unspecified as acute or chronic, without hemorrhage or perforation: Secondary | ICD-10-CM | POA: Diagnosis not present

## 2018-07-06 DIAGNOSIS — Z9181 History of falling: Secondary | ICD-10-CM | POA: Diagnosis not present

## 2018-07-06 DIAGNOSIS — N390 Urinary tract infection, site not specified: Secondary | ICD-10-CM | POA: Diagnosis not present

## 2018-07-06 DIAGNOSIS — G8929 Other chronic pain: Secondary | ICD-10-CM | POA: Diagnosis not present

## 2018-07-06 DIAGNOSIS — M21379 Foot drop, unspecified foot: Secondary | ICD-10-CM | POA: Diagnosis not present

## 2018-07-06 DIAGNOSIS — I82401 Acute embolism and thrombosis of unspecified deep veins of right lower extremity: Secondary | ICD-10-CM | POA: Diagnosis not present

## 2018-07-06 DIAGNOSIS — R1310 Dysphagia, unspecified: Secondary | ICD-10-CM | POA: Diagnosis not present

## 2018-07-06 DIAGNOSIS — J189 Pneumonia, unspecified organism: Secondary | ICD-10-CM | POA: Diagnosis not present

## 2018-07-06 DIAGNOSIS — I82621 Acute embolism and thrombosis of deep veins of right upper extremity: Secondary | ICD-10-CM | POA: Diagnosis not present

## 2018-07-06 DIAGNOSIS — Z7901 Long term (current) use of anticoagulants: Secondary | ICD-10-CM | POA: Diagnosis not present

## 2018-07-10 DIAGNOSIS — A4151 Sepsis due to Escherichia coli [E. coli]: Secondary | ICD-10-CM | POA: Diagnosis not present

## 2018-07-10 DIAGNOSIS — R1312 Dysphagia, oropharyngeal phase: Secondary | ICD-10-CM | POA: Diagnosis not present

## 2018-07-10 DIAGNOSIS — I82621 Acute embolism and thrombosis of deep veins of right upper extremity: Secondary | ICD-10-CM | POA: Diagnosis not present

## 2018-07-10 DIAGNOSIS — K259 Gastric ulcer, unspecified as acute or chronic, without hemorrhage or perforation: Secondary | ICD-10-CM | POA: Diagnosis not present

## 2018-07-10 DIAGNOSIS — I82401 Acute embolism and thrombosis of unspecified deep veins of right lower extremity: Secondary | ICD-10-CM | POA: Diagnosis not present

## 2018-07-10 DIAGNOSIS — D649 Anemia, unspecified: Secondary | ICD-10-CM | POA: Diagnosis not present

## 2018-07-10 DIAGNOSIS — Z9181 History of falling: Secondary | ICD-10-CM | POA: Diagnosis not present

## 2018-07-10 DIAGNOSIS — R1310 Dysphagia, unspecified: Secondary | ICD-10-CM | POA: Diagnosis not present

## 2018-07-10 DIAGNOSIS — N390 Urinary tract infection, site not specified: Secondary | ICD-10-CM | POA: Diagnosis not present

## 2018-07-10 DIAGNOSIS — G8929 Other chronic pain: Secondary | ICD-10-CM | POA: Diagnosis not present

## 2018-07-10 DIAGNOSIS — J189 Pneumonia, unspecified organism: Secondary | ICD-10-CM | POA: Diagnosis not present

## 2018-07-10 DIAGNOSIS — M21379 Foot drop, unspecified foot: Secondary | ICD-10-CM | POA: Diagnosis not present

## 2018-07-10 DIAGNOSIS — Z7901 Long term (current) use of anticoagulants: Secondary | ICD-10-CM | POA: Diagnosis not present

## 2018-07-12 DIAGNOSIS — I82621 Acute embolism and thrombosis of deep veins of right upper extremity: Secondary | ICD-10-CM | POA: Diagnosis not present

## 2018-07-12 DIAGNOSIS — R1312 Dysphagia, oropharyngeal phase: Secondary | ICD-10-CM | POA: Diagnosis not present

## 2018-07-12 DIAGNOSIS — D649 Anemia, unspecified: Secondary | ICD-10-CM | POA: Diagnosis not present

## 2018-07-12 DIAGNOSIS — G8929 Other chronic pain: Secondary | ICD-10-CM | POA: Diagnosis not present

## 2018-07-12 DIAGNOSIS — M21379 Foot drop, unspecified foot: Secondary | ICD-10-CM | POA: Diagnosis not present

## 2018-07-12 DIAGNOSIS — Z7901 Long term (current) use of anticoagulants: Secondary | ICD-10-CM | POA: Diagnosis not present

## 2018-07-12 DIAGNOSIS — I82401 Acute embolism and thrombosis of unspecified deep veins of right lower extremity: Secondary | ICD-10-CM | POA: Diagnosis not present

## 2018-07-12 DIAGNOSIS — Z9181 History of falling: Secondary | ICD-10-CM | POA: Diagnosis not present

## 2018-07-12 DIAGNOSIS — R1310 Dysphagia, unspecified: Secondary | ICD-10-CM | POA: Diagnosis not present

## 2018-07-12 DIAGNOSIS — K259 Gastric ulcer, unspecified as acute or chronic, without hemorrhage or perforation: Secondary | ICD-10-CM | POA: Diagnosis not present

## 2018-07-12 DIAGNOSIS — A4151 Sepsis due to Escherichia coli [E. coli]: Secondary | ICD-10-CM | POA: Diagnosis not present

## 2018-07-12 DIAGNOSIS — J189 Pneumonia, unspecified organism: Secondary | ICD-10-CM | POA: Diagnosis not present

## 2018-07-12 DIAGNOSIS — N390 Urinary tract infection, site not specified: Secondary | ICD-10-CM | POA: Diagnosis not present

## 2018-07-13 DIAGNOSIS — R1312 Dysphagia, oropharyngeal phase: Secondary | ICD-10-CM | POA: Diagnosis not present

## 2018-07-13 DIAGNOSIS — Z7901 Long term (current) use of anticoagulants: Secondary | ICD-10-CM | POA: Diagnosis not present

## 2018-07-13 DIAGNOSIS — I82401 Acute embolism and thrombosis of unspecified deep veins of right lower extremity: Secondary | ICD-10-CM | POA: Diagnosis not present

## 2018-07-13 DIAGNOSIS — G8929 Other chronic pain: Secondary | ICD-10-CM | POA: Diagnosis not present

## 2018-07-13 DIAGNOSIS — J189 Pneumonia, unspecified organism: Secondary | ICD-10-CM | POA: Diagnosis not present

## 2018-07-13 DIAGNOSIS — R1310 Dysphagia, unspecified: Secondary | ICD-10-CM | POA: Diagnosis not present

## 2018-07-13 DIAGNOSIS — N390 Urinary tract infection, site not specified: Secondary | ICD-10-CM | POA: Diagnosis not present

## 2018-07-13 DIAGNOSIS — Z9181 History of falling: Secondary | ICD-10-CM | POA: Diagnosis not present

## 2018-07-13 DIAGNOSIS — M21379 Foot drop, unspecified foot: Secondary | ICD-10-CM | POA: Diagnosis not present

## 2018-07-13 DIAGNOSIS — A4151 Sepsis due to Escherichia coli [E. coli]: Secondary | ICD-10-CM | POA: Diagnosis not present

## 2018-07-13 DIAGNOSIS — K259 Gastric ulcer, unspecified as acute or chronic, without hemorrhage or perforation: Secondary | ICD-10-CM | POA: Diagnosis not present

## 2018-07-13 DIAGNOSIS — D649 Anemia, unspecified: Secondary | ICD-10-CM | POA: Diagnosis not present

## 2018-07-13 DIAGNOSIS — I82621 Acute embolism and thrombosis of deep veins of right upper extremity: Secondary | ICD-10-CM | POA: Diagnosis not present

## 2018-07-19 DIAGNOSIS — R1310 Dysphagia, unspecified: Secondary | ICD-10-CM | POA: Diagnosis not present

## 2018-07-19 DIAGNOSIS — I82401 Acute embolism and thrombosis of unspecified deep veins of right lower extremity: Secondary | ICD-10-CM | POA: Diagnosis not present

## 2018-07-19 DIAGNOSIS — Z9181 History of falling: Secondary | ICD-10-CM | POA: Diagnosis not present

## 2018-07-19 DIAGNOSIS — I82621 Acute embolism and thrombosis of deep veins of right upper extremity: Secondary | ICD-10-CM | POA: Diagnosis not present

## 2018-07-19 DIAGNOSIS — K259 Gastric ulcer, unspecified as acute or chronic, without hemorrhage or perforation: Secondary | ICD-10-CM | POA: Diagnosis not present

## 2018-07-19 DIAGNOSIS — M21379 Foot drop, unspecified foot: Secondary | ICD-10-CM | POA: Diagnosis not present

## 2018-07-19 DIAGNOSIS — G8929 Other chronic pain: Secondary | ICD-10-CM | POA: Diagnosis not present

## 2018-07-19 DIAGNOSIS — A4151 Sepsis due to Escherichia coli [E. coli]: Secondary | ICD-10-CM | POA: Diagnosis not present

## 2018-07-19 DIAGNOSIS — D649 Anemia, unspecified: Secondary | ICD-10-CM | POA: Diagnosis not present

## 2018-07-19 DIAGNOSIS — J189 Pneumonia, unspecified organism: Secondary | ICD-10-CM | POA: Diagnosis not present

## 2018-07-19 DIAGNOSIS — N390 Urinary tract infection, site not specified: Secondary | ICD-10-CM | POA: Diagnosis not present

## 2018-07-19 DIAGNOSIS — Z7901 Long term (current) use of anticoagulants: Secondary | ICD-10-CM | POA: Diagnosis not present

## 2018-07-19 DIAGNOSIS — R1312 Dysphagia, oropharyngeal phase: Secondary | ICD-10-CM | POA: Diagnosis not present

## 2018-07-20 DIAGNOSIS — M21379 Foot drop, unspecified foot: Secondary | ICD-10-CM | POA: Diagnosis not present

## 2018-07-20 DIAGNOSIS — R1312 Dysphagia, oropharyngeal phase: Secondary | ICD-10-CM | POA: Diagnosis not present

## 2018-07-20 DIAGNOSIS — J189 Pneumonia, unspecified organism: Secondary | ICD-10-CM | POA: Diagnosis not present

## 2018-07-20 DIAGNOSIS — Z9181 History of falling: Secondary | ICD-10-CM | POA: Diagnosis not present

## 2018-07-20 DIAGNOSIS — I82621 Acute embolism and thrombosis of deep veins of right upper extremity: Secondary | ICD-10-CM | POA: Diagnosis not present

## 2018-07-20 DIAGNOSIS — D649 Anemia, unspecified: Secondary | ICD-10-CM | POA: Diagnosis not present

## 2018-07-20 DIAGNOSIS — R1310 Dysphagia, unspecified: Secondary | ICD-10-CM | POA: Diagnosis not present

## 2018-07-20 DIAGNOSIS — A4151 Sepsis due to Escherichia coli [E. coli]: Secondary | ICD-10-CM | POA: Diagnosis not present

## 2018-07-20 DIAGNOSIS — I82401 Acute embolism and thrombosis of unspecified deep veins of right lower extremity: Secondary | ICD-10-CM | POA: Diagnosis not present

## 2018-07-20 DIAGNOSIS — K259 Gastric ulcer, unspecified as acute or chronic, without hemorrhage or perforation: Secondary | ICD-10-CM | POA: Diagnosis not present

## 2018-07-20 DIAGNOSIS — N390 Urinary tract infection, site not specified: Secondary | ICD-10-CM | POA: Diagnosis not present

## 2018-07-20 DIAGNOSIS — Z7901 Long term (current) use of anticoagulants: Secondary | ICD-10-CM | POA: Diagnosis not present

## 2018-07-20 DIAGNOSIS — G8929 Other chronic pain: Secondary | ICD-10-CM | POA: Diagnosis not present

## 2018-07-24 DIAGNOSIS — R1312 Dysphagia, oropharyngeal phase: Secondary | ICD-10-CM | POA: Diagnosis not present

## 2018-07-24 DIAGNOSIS — K259 Gastric ulcer, unspecified as acute or chronic, without hemorrhage or perforation: Secondary | ICD-10-CM | POA: Diagnosis not present

## 2018-07-24 DIAGNOSIS — Z9181 History of falling: Secondary | ICD-10-CM | POA: Diagnosis not present

## 2018-07-24 DIAGNOSIS — A4151 Sepsis due to Escherichia coli [E. coli]: Secondary | ICD-10-CM | POA: Diagnosis not present

## 2018-07-24 DIAGNOSIS — I82621 Acute embolism and thrombosis of deep veins of right upper extremity: Secondary | ICD-10-CM | POA: Diagnosis not present

## 2018-07-24 DIAGNOSIS — I82401 Acute embolism and thrombosis of unspecified deep veins of right lower extremity: Secondary | ICD-10-CM | POA: Diagnosis not present

## 2018-07-24 DIAGNOSIS — J189 Pneumonia, unspecified organism: Secondary | ICD-10-CM | POA: Diagnosis not present

## 2018-07-24 DIAGNOSIS — D649 Anemia, unspecified: Secondary | ICD-10-CM | POA: Diagnosis not present

## 2018-07-24 DIAGNOSIS — N390 Urinary tract infection, site not specified: Secondary | ICD-10-CM | POA: Diagnosis not present

## 2018-07-24 DIAGNOSIS — M21379 Foot drop, unspecified foot: Secondary | ICD-10-CM | POA: Diagnosis not present

## 2018-07-24 DIAGNOSIS — G8929 Other chronic pain: Secondary | ICD-10-CM | POA: Diagnosis not present

## 2018-07-24 DIAGNOSIS — R1310 Dysphagia, unspecified: Secondary | ICD-10-CM | POA: Diagnosis not present

## 2018-07-24 DIAGNOSIS — Z7901 Long term (current) use of anticoagulants: Secondary | ICD-10-CM | POA: Diagnosis not present

## 2018-07-27 ENCOUNTER — Emergency Department (HOSPITAL_COMMUNITY): Payer: Medicare Other

## 2018-07-27 ENCOUNTER — Inpatient Hospital Stay (HOSPITAL_COMMUNITY)
Admission: EM | Admit: 2018-07-27 | Discharge: 2018-08-02 | DRG: 193 | Discharge status: Against Medical Advice | Payer: Medicare Other | Attending: Family Medicine | Admitting: Family Medicine

## 2018-07-27 ENCOUNTER — Other Ambulatory Visit: Payer: Self-pay

## 2018-07-27 ENCOUNTER — Encounter (HOSPITAL_COMMUNITY): Payer: Self-pay | Admitting: Emergency Medicine

## 2018-07-27 DIAGNOSIS — J9621 Acute and chronic respiratory failure with hypoxia: Secondary | ICD-10-CM | POA: Diagnosis present

## 2018-07-27 DIAGNOSIS — Y95 Nosocomial condition: Secondary | ICD-10-CM | POA: Diagnosis present

## 2018-07-27 DIAGNOSIS — Z79899 Other long term (current) drug therapy: Secondary | ICD-10-CM

## 2018-07-27 DIAGNOSIS — F319 Bipolar disorder, unspecified: Secondary | ICD-10-CM | POA: Diagnosis present

## 2018-07-27 DIAGNOSIS — N390 Urinary tract infection, site not specified: Secondary | ICD-10-CM | POA: Diagnosis not present

## 2018-07-27 DIAGNOSIS — E876 Hypokalemia: Secondary | ICD-10-CM | POA: Diagnosis present

## 2018-07-27 DIAGNOSIS — R1310 Dysphagia, unspecified: Secondary | ICD-10-CM | POA: Diagnosis not present

## 2018-07-27 DIAGNOSIS — I82401 Acute embolism and thrombosis of unspecified deep veins of right lower extremity: Secondary | ICD-10-CM | POA: Diagnosis not present

## 2018-07-27 DIAGNOSIS — R1312 Dysphagia, oropharyngeal phase: Secondary | ICD-10-CM | POA: Diagnosis not present

## 2018-07-27 DIAGNOSIS — G8929 Other chronic pain: Secondary | ICD-10-CM | POA: Diagnosis present

## 2018-07-27 DIAGNOSIS — J9690 Respiratory failure, unspecified, unspecified whether with hypoxia or hypercapnia: Secondary | ICD-10-CM | POA: Diagnosis not present

## 2018-07-27 DIAGNOSIS — M21379 Foot drop, unspecified foot: Secondary | ICD-10-CM | POA: Diagnosis not present

## 2018-07-27 DIAGNOSIS — Z7901 Long term (current) use of anticoagulants: Secondary | ICD-10-CM | POA: Diagnosis not present

## 2018-07-27 DIAGNOSIS — K259 Gastric ulcer, unspecified as acute or chronic, without hemorrhage or perforation: Secondary | ICD-10-CM | POA: Diagnosis not present

## 2018-07-27 DIAGNOSIS — J189 Pneumonia, unspecified organism: Principal | ICD-10-CM | POA: Diagnosis present

## 2018-07-27 DIAGNOSIS — F1120 Opioid dependence, uncomplicated: Secondary | ICD-10-CM | POA: Diagnosis present

## 2018-07-27 DIAGNOSIS — Z87891 Personal history of nicotine dependence: Secondary | ICD-10-CM | POA: Diagnosis not present

## 2018-07-27 DIAGNOSIS — F200 Paranoid schizophrenia: Secondary | ICD-10-CM | POA: Diagnosis present

## 2018-07-27 DIAGNOSIS — A4151 Sepsis due to Escherichia coli [E. coli]: Secondary | ICD-10-CM | POA: Diagnosis not present

## 2018-07-27 DIAGNOSIS — R0902 Hypoxemia: Secondary | ICD-10-CM

## 2018-07-27 DIAGNOSIS — I82621 Acute embolism and thrombosis of deep veins of right upper extremity: Secondary | ICD-10-CM | POA: Diagnosis not present

## 2018-07-27 DIAGNOSIS — J9601 Acute respiratory failure with hypoxia: Secondary | ICD-10-CM | POA: Diagnosis not present

## 2018-07-27 DIAGNOSIS — A419 Sepsis, unspecified organism: Secondary | ICD-10-CM | POA: Diagnosis present

## 2018-07-27 DIAGNOSIS — R0602 Shortness of breath: Secondary | ICD-10-CM | POA: Diagnosis not present

## 2018-07-27 DIAGNOSIS — J8410 Pulmonary fibrosis, unspecified: Secondary | ICD-10-CM | POA: Diagnosis not present

## 2018-07-27 DIAGNOSIS — Z20828 Contact with and (suspected) exposure to other viral communicable diseases: Secondary | ICD-10-CM | POA: Diagnosis present

## 2018-07-27 DIAGNOSIS — R000 Tachycardia, unspecified: Secondary | ICD-10-CM | POA: Diagnosis not present

## 2018-07-27 DIAGNOSIS — J984 Other disorders of lung: Secondary | ICD-10-CM | POA: Diagnosis not present

## 2018-07-27 DIAGNOSIS — Z86718 Personal history of other venous thrombosis and embolism: Secondary | ICD-10-CM

## 2018-07-27 DIAGNOSIS — F1111 Opioid abuse, in remission: Secondary | ICD-10-CM | POA: Diagnosis present

## 2018-07-27 DIAGNOSIS — Z9181 History of falling: Secondary | ICD-10-CM | POA: Diagnosis not present

## 2018-07-27 DIAGNOSIS — J849 Interstitial pulmonary disease, unspecified: Secondary | ICD-10-CM | POA: Diagnosis present

## 2018-07-27 DIAGNOSIS — D649 Anemia, unspecified: Secondary | ICD-10-CM | POA: Diagnosis not present

## 2018-07-27 DIAGNOSIS — F988 Other specified behavioral and emotional disorders with onset usually occurring in childhood and adolescence: Secondary | ICD-10-CM | POA: Diagnosis present

## 2018-07-27 LAB — LACTIC ACID, PLASMA
Lactic Acid, Venous: 1.1 mmol/L (ref 0.5–1.9)
Lactic Acid, Venous: 0.8 mmol/L (ref 0.5–1.9)

## 2018-07-27 LAB — RESPIRATORY PANEL BY PCR

## 2018-07-27 LAB — CBC WITH DIFFERENTIAL/PLATELET
Abs Immature Granulocytes: 0.06 10*3/uL (ref 0.00–0.07)
Basophils Absolute: 0.1 10*3/uL (ref 0.0–0.1)
Basophils Relative: 1 %
Eosinophils Absolute: 0.8 10*3/uL — ABNORMAL HIGH (ref 0.0–0.5)
Eosinophils Relative: 6 %
HCT: 37 % (ref 36.0–46.0)
Hemoglobin: 11.8 g/dL — ABNORMAL LOW (ref 12.0–15.0)
Immature Granulocytes: 0 %
Lymphocytes Relative: 29 %
Lymphs Abs: 4 10*3/uL (ref 0.7–4.0)
MCH: 30.7 pg (ref 26.0–34.0)
MCHC: 31.9 g/dL (ref 30.0–36.0)
MCV: 96.4 fL (ref 80.0–100.0)
Monocytes Absolute: 1.1 10*3/uL — ABNORMAL HIGH (ref 0.1–1.0)
Monocytes Relative: 8 %
Neutro Abs: 7.7 10*3/uL (ref 1.7–7.7)
Neutrophils Relative %: 56 %
Platelets: 594 10*3/uL — ABNORMAL HIGH (ref 150–400)
RBC: 3.84 MIL/uL — ABNORMAL LOW (ref 3.87–5.11)
RDW: 16.8 % — ABNORMAL HIGH (ref 11.5–15.5)
WBC: 13.8 10*3/uL — ABNORMAL HIGH (ref 4.0–10.5)
nRBC: 0 % (ref 0.0–0.2)

## 2018-07-27 LAB — MRSA PCR SCREENING: MRSA by PCR: NEGATIVE

## 2018-07-27 LAB — COMPREHENSIVE METABOLIC PANEL
ALT: 13 U/L (ref 0–44)
AST: 22 U/L (ref 15–41)
Albumin: 2.7 g/dL — ABNORMAL LOW (ref 3.5–5.0)
Alkaline Phosphatase: 109 U/L (ref 38–126)
Anion gap: 9 (ref 5–15)
BUN: 16 mg/dL (ref 6–20)
CO2: 26 mmol/L (ref 22–32)
Calcium: 8.8 mg/dL — ABNORMAL LOW (ref 8.9–10.3)
Chloride: 105 mmol/L (ref 98–111)
Creatinine, Ser: 0.57 mg/dL (ref 0.44–1.00)
GFR calc Af Amer: >60 mL/min (ref >60)
GFR calc non Af Amer: >60 mL/min (ref >60)
Glucose, Bld: 114 mg/dL — ABNORMAL HIGH (ref 70–99)
Potassium: 2.9 mmol/L — ABNORMAL LOW (ref 3.5–5.1)
Sodium: 140 mmol/L (ref 135–145)
Total Bilirubin: 0.2 mg/dL — ABNORMAL LOW (ref 0.3–1.2)
Total Protein: 6.2 g/dL — ABNORMAL LOW (ref 6.5–8.1)

## 2018-07-27 LAB — TROPONIN I: Troponin I: <0.03 ng/mL (ref <0.03)

## 2018-07-27 LAB — SARS CORONAVIRUS 2 BY RT PCR (HOSPITAL ORDER, PERFORMED IN ~~LOC~~ HOSPITAL LAB): SARS Coronavirus 2: NEGATIVE

## 2018-07-27 LAB — MAGNESIUM: Magnesium: 1.9 mg/dL (ref 1.7–2.4)

## 2018-07-27 LAB — BRAIN NATRIURETIC PEPTIDE: B Natriuretic Peptide: 25 pg/mL (ref 0.0–100.0)

## 2018-07-27 LAB — PROCALCITONIN: Procalcitonin: 5.87 ng/mL

## 2018-07-27 MED ORDER — ACETAMINOPHEN 500 MG PO TABS: 500.0000 mg | ORAL_TABLET | Freq: Three times a day (TID) | ORAL | Status: DC | PRN

## 2018-07-27 MED ORDER — FLUOXETINE HCL 20 MG PO CAPS
40.0000 mg | ORAL_CAPSULE | Freq: Every day | ORAL | Status: DC
  Administered 2018-07-28 – 2018-08-02 (×6): 40 mg via ORAL
  Filled 2018-07-27 (×7): qty 2

## 2018-07-27 MED ORDER — GUAIFENESIN ER 600 MG PO TB12
600.0000 mg | ORAL_TABLET | Freq: Two times a day (BID) | ORAL | Status: DC
  Administered 2018-07-27 – 2018-08-02 (×12): 600 mg via ORAL
  Filled 2018-07-27 (×12): qty 1

## 2018-07-27 MED ORDER — SODIUM CHLORIDE 0.9 % IV BOLUS
1000.0000 mL | Freq: Once | INTRAVENOUS | Status: AC
  Administered 2018-07-27: 12:00:00 1000 mL via INTRAVENOUS

## 2018-07-27 MED ORDER — PANTOPRAZOLE SODIUM 40 MG PO TBEC
40.0000 mg | DELAYED_RELEASE_TABLET | Freq: Two times a day (BID) | ORAL | Status: DC
  Administered 2018-07-27 – 2018-08-02 (×12): 40 mg via ORAL
  Filled 2018-07-27 (×12): qty 1

## 2018-07-27 MED ORDER — LORATADINE 10 MG PO TABS
10.0000 mg | ORAL_TABLET | Freq: Every day | ORAL | Status: DC
  Administered 2018-07-28 – 2018-08-02 (×6): 10 mg via ORAL
  Filled 2018-07-27 (×6): qty 1

## 2018-07-27 MED ORDER — BUDESONIDE 0.25 MG/2ML IN SUSP
0.2500 mg | Freq: Two times a day (BID) | RESPIRATORY_TRACT | Status: DC
  Administered 2018-07-27 – 2018-08-02 (×12): 0.25 mg via RESPIRATORY_TRACT
  Filled 2018-07-27 (×12): qty 2

## 2018-07-27 MED ORDER — APIXABAN 5 MG PO TABS
5.0000 mg | ORAL_TABLET | Freq: Two times a day (BID) | ORAL | Status: DC
  Administered 2018-07-27 – 2018-08-02 (×12): 5 mg via ORAL
  Filled 2018-07-27 (×13): qty 1

## 2018-07-27 MED ORDER — GUAIFENESIN-DM 100-10 MG/5ML PO SYRP
5.0000 mL | ORAL_SOLUTION | ORAL | Status: DC | PRN
  Administered 2018-07-27 – 2018-08-02 (×21): 5 mL via ORAL
  Filled 2018-07-27 (×21): qty 5

## 2018-07-27 MED ORDER — VANCOMYCIN HCL IN DEXTROSE 1-5 GM/200ML-% IV SOLN
1000.0000 mg | Freq: Once | INTRAVENOUS | Status: AC
  Administered 2018-07-27: 1000 mg via INTRAVENOUS
  Filled 2018-07-27: qty 200

## 2018-07-27 MED ORDER — MELATONIN 5 MG PO CAPS: 20.0000 mg | ORAL_CAPSULE | Freq: Every day | ORAL | Status: DC

## 2018-07-27 MED ORDER — PIPERACILLIN-TAZOBACTAM 3.375 G IVPB
3.3750 g | Freq: Three times a day (TID) | INTRAVENOUS | Status: DC
  Administered 2018-07-27 – 2018-08-02 (×16): 3.375 g via INTRAVENOUS
  Filled 2018-07-27 (×16): qty 50

## 2018-07-27 MED ORDER — LEVALBUTEROL HCL 1.25 MG/0.5ML IN NEBU
1.2500 mg | INHALATION_SOLUTION | Freq: Four times a day (QID) | RESPIRATORY_TRACT | Status: DC
  Administered 2018-07-28 – 2018-08-02 (×21): 1.25 mg via RESPIRATORY_TRACT
  Filled 2018-07-27 (×22): qty 0.5

## 2018-07-27 MED ORDER — IPRATROPIUM-ALBUTEROL 0.5-2.5 (3) MG/3ML IN SOLN: 3.0000 mL | Freq: Four times a day (QID) | RESPIRATORY_TRACT | Status: DC

## 2018-07-27 MED ORDER — POTASSIUM CHLORIDE CRYS ER 20 MEQ PO TBCR
40.0000 meq | EXTENDED_RELEASE_TABLET | Freq: Once | ORAL | Status: AC
  Administered 2018-07-27: 40 meq via ORAL
  Filled 2018-07-27: qty 2

## 2018-07-27 MED ORDER — METOPROLOL TARTRATE 5 MG/5ML IV SOLN
5.0000 mg | Freq: Once | INTRAVENOUS | Status: DC
  Filled 2018-07-27: qty 5

## 2018-07-27 MED ORDER — ONDANSETRON HCL 4 MG/2ML IJ SOLN: 4.0000 mg | Freq: Four times a day (QID) | INTRAMUSCULAR | Status: DC | PRN

## 2018-07-27 MED ORDER — VANCOMYCIN HCL 10 G IV SOLR: 15.0000 mg/kg | Freq: Once | INTRAVENOUS | Status: DC

## 2018-07-27 MED ORDER — POTASSIUM CHLORIDE 10 MEQ/100ML IV SOLN
10.0000 meq | Freq: Once | INTRAVENOUS | Status: AC
  Administered 2018-07-27: 14:00:00 10 meq via INTRAVENOUS
  Filled 2018-07-27: qty 100

## 2018-07-27 MED ORDER — IPRATROPIUM BROMIDE 0.02 % IN SOLN
0.5000 mg | Freq: Four times a day (QID) | RESPIRATORY_TRACT | Status: DC
  Administered 2018-07-28 – 2018-08-02 (×21): 0.5 mg via RESPIRATORY_TRACT
  Filled 2018-07-27 (×22): qty 2.5

## 2018-07-27 MED ORDER — SODIUM CHLORIDE 0.9% FLUSH
3.0000 mL | INTRAVENOUS | Status: DC | PRN
  Administered 2018-07-27: 17:00:00 3 mL via INTRAVENOUS
  Filled 2018-07-27: qty 3

## 2018-07-27 MED ORDER — ONDANSETRON HCL 4 MG PO TABS: 4.0000 mg | ORAL_TABLET | Freq: Four times a day (QID) | ORAL | Status: DC | PRN

## 2018-07-27 MED ORDER — VANCOMYCIN HCL IN DEXTROSE 750-5 MG/150ML-% IV SOLN
750.0000 mg | Freq: Two times a day (BID) | INTRAVENOUS | Status: DC
  Administered 2018-07-27: 750 mg via INTRAVENOUS
  Filled 2018-07-27: qty 150

## 2018-07-27 MED ORDER — POTASSIUM CHLORIDE CRYS ER 20 MEQ PO TBCR
40.0000 meq | EXTENDED_RELEASE_TABLET | Freq: Three times a day (TID) | ORAL | Status: DC
  Administered 2018-07-27 – 2018-07-29 (×6): 40 meq via ORAL
  Filled 2018-07-27 (×6): qty 2

## 2018-07-27 MED ORDER — SODIUM CHLORIDE 0.9% FLUSH
3.0000 mL | Freq: Two times a day (BID) | INTRAVENOUS | Status: DC
  Administered 2018-07-27 – 2018-08-02 (×5): 3 mL via INTRAVENOUS

## 2018-07-27 MED ORDER — DIVALPROEX SODIUM ER 500 MG PO TB24
500.0000 mg | ORAL_TABLET | Freq: Every day | ORAL | Status: DC
  Filled 2018-07-27 (×2): qty 1

## 2018-07-27 MED ORDER — BENZONATATE 100 MG PO CAPS
100.0000 mg | ORAL_CAPSULE | Freq: Three times a day (TID) | ORAL | Status: DC
  Administered 2018-07-27 – 2018-07-28 (×3): 100 mg via ORAL
  Filled 2018-07-27 (×3): qty 1

## 2018-07-27 MED ORDER — IOHEXOL 350 MG/ML SOLN
100.0000 mL | Freq: Once | INTRAVENOUS | Status: AC | PRN
  Administered 2018-07-27: 100 mL via INTRAVENOUS

## 2018-07-27 MED ORDER — SODIUM CHLORIDE 0.9 % IV SOLN: 250.0000 mL | INTRAVENOUS | Status: DC | PRN

## 2018-07-27 MED ORDER — PIPERACILLIN-TAZOBACTAM 3.375 G IVPB 30 MIN
3.3750 g | Freq: Once | INTRAVENOUS | Status: AC
  Administered 2018-07-27: 12:00:00 3.375 g via INTRAVENOUS

## 2018-07-27 MED ORDER — FENTANYL CITRATE (PF) 100 MCG/2ML IJ SOLN
40.0000 ug | INTRAMUSCULAR | Status: DC | PRN
  Administered 2018-07-27 – 2018-07-31 (×41): 40 ug via INTRAVENOUS
  Filled 2018-07-27 (×41): qty 2

## 2018-07-27 NOTE — Progress Notes (Addendum)
Attempted to give patient IV metoprolol due to high heart rate. Pt refused to take pt stated her heart rate is always high and doesn't want her heart rate to drop low. educated pt on the importance of taking the medication to help lower heart rate. Paged mid level to call nurse. Awaiting for call back. Mid level was ok with not giving the medication. Will continue to monitor pt throughout shift.

## 2018-07-27 NOTE — Progress Notes (Signed)
PHARMACIST - PHYSICIAN ORDER COMMUNICATION  CONCERNING: P&T Medication Policy on Herbal Medications  DESCRIPTION:  This patient's order for:  Melatonin has been noted.  This product(s) is classified as an "herbal" or natural product. Due to a lack of definitive safety studies or FDA approval, nonstandard manufacturing practices, plus the potential risk of unknown drug-drug interactions while on inpatient medications, the Pharmacy and Therapeutics Committee does not permit the use of "herbal" or natural products of this type within [facility].   ACTION TAKEN: The pharmacy department is unable to verify this order at this time and your patient has been informed of this safety policy. Please reevaluate patient's clinical condition at discharge and address if the herbal or natural product(s) should be resumed at that time.  Isac Sarna, BS [place], BCPS Clinical Pharmacist Pager [phone]

## 2018-07-27 NOTE — ED Triage Notes (Signed)
Patient complaining of shortness of breath and back pain. States home health nurse came to house today and she had low O2 saturation. O2 saturation 71% on room air upon arrival to ER. Placed patient on 5L via nasal canula and O2 saturation increased to 98%. States she has order for home O2 but has not gotten it yet.

## 2018-07-27 NOTE — ED Provider Notes (Signed)
[facility] EMERGENCY DEPARTMENT Provider Note   CSN: [id number] Arrival date & time: 07/27/18  1047    History   Chief Complaint Chief Complaint  Patient presents with  . Shortness of Breath    HPI Christine Ortega is a 58 y.o. female.     Patient with history of schizophrenia, seizures, recent hospitalization in February for aspiration pneumonia, and anemia presents with worsening dyspnea and hypoxia.  Patient was checked in by home health nurse and found to be hypoxic in the 70s.  Increased work of breathing recently.  Reportedly patient has home oxygen ordered however has not arrived yet.  Patient denies fevers however has had worsening cough and shortness of breath.  Patient had blood clot in the past and is compliant with anticoagulants.  No known significant sick contacts.  Patient has been at home since her last discharge.  No recent travel.     Past Medical History:  Diagnosis Date  . ADD (attention deficit disorder)   . Anxiety   . Bipolar disorder ([place])   . Chronic pain   . Depression   . Narcotic abuse in remission ([place])   . Schizophrenia ([place])   . Seizures [facility])     Patient Active Problem List   Diagnosis Date Noted  . Acute hypoxemic respiratory failure ([place]) 07/27/2018  . Bowel perforation ([place])   . Acute respiratory failure ([place])   . Upper GI bleed   . Sepsis due to undetermined organism ([place]) 05/19/2018  . Acute blood loss anemia 05/19/2018  . Community acquired pneumonia 05/19/2018  . Thrombocytosis ([place]) 05/19/2018  . Hypokalemia   . Schizophrenia, paranoid type ([place]) 08/08/2011  . Narcotic abuse in remission ([place])   . Bipolar disorder ([place])   . Chronic pain   . ADD (attention deficit disorder)     Past Surgical History:  Procedure Laterality Date  . BACK SURGERY    . ESOPHAGOGASTRODUODENOSCOPY N/A 05/19/2018   Procedure: ESOPHAGOGASTRODUODENOSCOPY (EGD);  Surgeon: Yetta Flock, MD;  Location: [facility] ENDOSCOPY;  Service:  Gastroenterology;  Laterality: N/A;  egd at bedside in ICU, intubated  . IR ANGIOGRAM SELECTIVE EACH ADDITIONAL VESSEL  05/20/2018  . IR ANGIOGRAM SELECTIVE EACH ADDITIONAL VESSEL  05/20/2018  . IR ANGIOGRAM VISCERAL SELECTIVE  05/20/2018  . IR ANGIOGRAM VISCERAL SELECTIVE  05/20/2018  . IR EMBO ART  VEN HEMORR LYMPH EXTRAV  INC GUIDE ROADMAPPING  05/20/2018  . IR US GUIDE VASC ACCESS LEFT  05/20/2018     OB History   No obstetric history on file.      Home Medications    Prior to Admission medications   Medication Sig Start Date End Date Taking? Authorizing Provider  acetaminophen (TYLENOL) 500 MG tablet Take 1 tablet (500 mg total) by mouth every 8 (eight) hours as needed for moderate pain. 06/08/18 06/08/19 Yes Thurnell Lose, MD  apixaban (ELIQUIS) 5 MG TABS tablet Take 1 tablet (5 mg total) by mouth 2 (two) times daily. 2 pills mouth twice a day till 06/12/2018 then switch to 1 pill twice a day from 06/13/2018 Patient taking differently: Take 10 mg by mouth 2 (two) times daily.  06/08/18  Yes Thurnell Lose, MD  cetirizine (ZYRTEC) 10 MG tablet Take 10 mg by mouth daily.   Yes [provider]  FLUoxetine (PROZAC) 40 MG capsule Take 1 capsule (40 mg total) by mouth daily. For depression and anxiety. 08/08/11  Yes Mashburn, Marlane Hatcher, PA-C  guaiFENesin (MUCINEX) 600 MG 12 hr tablet  Take 800 mg by mouth 2 (two) times daily.    Yes [provider]  pantoprazole (PROTONIX) 40 MG tablet Take 1 tablet (40 mg total) by mouth 2 (two) times daily. 06/08/18  Yes Thurnell Lose, MD  Probiotic Product (PROBIOTIC PO) Take 1 tablet by mouth daily. Women's probiotic for GI health   Yes [provider]  divalproex (DEPAKOTE ER) 500 MG 24 hr tablet Take 500 mg by mouth at bedtime. For moods 03/08/18   [provider]  lactose free nutrition (BOOST PLUS) LIQD Take 237 mLs by mouth 3 (three) times daily with meals. Patient not taking: Reported on 07/27/2018 06/08/18   Thurnell Lose, MD  Melatonin 5 MG CAPS Take 20 mg by mouth at bedtime.     [provider]  metoprolol tartrate (LOPRESSOR) 25 MG tablet Take 1 tablet (25 mg total) by mouth 2 (two) times daily. Patient not taking: Reported on 07/27/2018 06/08/18   Thurnell Lose, MD  ziprasidone (GEODON) 80 MG capsule Take one capsule at bedtime for depression and mood stability. Patient not taking: Reported on 07/27/2018 08/08/11   Pamelia Hoit    Family History History reviewed. No pertinent family history.  Social History Social History   Tobacco Use  . Smoking status: Former Smoker    Packs/day: 0.50  . Smokeless tobacco: Never Used  Substance Use Topics  . Alcohol use: No  . Drug use: No     Allergies   Nsaids and Tramadol hcl   Review of Systems Review of Systems  Constitutional: Positive for appetite change. Negative for chills and fever.  HENT: Negative for congestion.   Eyes: Negative for visual disturbance.  Respiratory: Positive for cough and shortness of breath.   Cardiovascular: Negative for chest pain and leg swelling.  Gastrointestinal: Negative for abdominal pain and vomiting.  Genitourinary: Negative for dysuria and flank pain.  Musculoskeletal: Negative for back pain, neck pain and neck stiffness.  Skin: Negative for rash.  Neurological: Negative for light-headedness and headaches.     Physical Exam Updated Vital Signs BP 134/88 (BP Location: Right Arm)   Pulse 82   Temp 97.9 F (36.6 C) (Oral)   Resp 20   Ht 5\' 3"  (1.6 m)   Wt 54.5 kg   SpO2 98%   BMI 21.28 kg/m   Physical Exam Vitals signs and nursing note reviewed.  Constitutional:      Appearance: She is well-developed.  HENT:     Head: Normocephalic and atraumatic.     Comments: Dry mucous membranes Eyes:     General:        Right eye: No discharge.        Left eye: No discharge.     Conjunctiva/sclera: Conjunctivae normal.  Neck:     Musculoskeletal: Normal range of motion and  neck supple.     Trachea: No tracheal deviation.  Cardiovascular:     Rate and Rhythm: Regular rhythm. Tachycardia present.  Pulmonary:     Effort: Tachypnea present.     Breath sounds: Decreased breath sounds and rhonchi present.  Abdominal:     General: There is no distension.     Palpations: Abdomen is soft.     Tenderness: There is no abdominal tenderness. There is no guarding.  Musculoskeletal:     Right lower leg: No edema.     Left lower leg: No edema.  Skin:    General: Skin is warm.     Findings:  No rash.  Neurological:     Mental Status: She is alert and oriented to person, place, and time.      ED Treatments / Results  Labs (all labs ordered are listed, but only abnormal results are displayed) Labs Reviewed  COMPREHENSIVE METABOLIC PANEL - Abnormal; Notable for the following components:      Result Value   Potassium 2.9 (*)    Glucose, Bld 114 (*)    Calcium 8.8 (*)    Total Protein 6.2 (*)    Albumin 2.7 (*)    Total Bilirubin 0.2 (*)    All other components within normal limits  CBC WITH DIFFERENTIAL/PLATELET - Abnormal; Notable for the following components:   WBC 13.8 (*)    RBC 3.84 (*)    Hemoglobin 11.8 (*)    RDW 16.8 (*)    Platelets 594 (*)    Monocytes Absolute 1.1 (*)    Eosinophils Absolute 0.8 (*)    All other components within normal limits  BASIC METABOLIC PANEL - Abnormal; Notable for the following components:   Calcium 8.6 (*)    All other components within normal limits  CBC - Abnormal; Notable for the following components:   WBC 12.6 (*)    RBC 3.59 (*)    Hemoglobin 11.0 (*)    HCT 35.2 (*)    RDW 16.8 (*)    Platelets 532 (*)    All other components within normal limits  SEDIMENTATION RATE - Abnormal; Notable for the following components:   Sed Rate 39 (*)    All other components within normal limits  BASIC METABOLIC PANEL - Abnormal; Notable for the following components:   Glucose, Bld 140 (*)    All other components within  normal limits  CBC - Abnormal; Notable for the following components:   RBC 3.69 (*)    Hemoglobin 11.4 (*)    RDW 16.5 (*)    Platelets 529 (*)    All other components within normal limits  SARS CORONAVIRUS 2 (HOSPITAL ORDER, [place] [facility])  CULTURE, BLOOD (ROUTINE X 2)  CULTURE, BLOOD (ROUTINE X 2)  RESPIRATORY PANEL BY PCR  MRSA PCR SCREENING  TROPONIN I  BRAIN NATRIURETIC PEPTIDE  LACTIC ACID, PLASMA  LACTIC ACID, PLASMA  MAGNESIUM  PROCALCITONIN  MAGNESIUM  BASIC METABOLIC PANEL  CBC    EKG None  Radiology No results found.  Procedures .Critical Care Performed by: Elnora Morrison, MD Authorized by: Elnora Morrison, MD   Critical care provider statement:    Critical care time (minutes):  40   Critical care start time:  07/27/2018 11:10 AM   Critical care end time:  07/27/2018 11:50 AM   Critical care time was exclusive of:  Separately billable procedures and treating other patients and teaching time   Critical care was necessary to treat or prevent imminent or life-threatening deterioration of the following conditions:  Respiratory failure   Critical care was time spent personally by me on the following activities:  Discussions with consultants, evaluation of patient's response to treatment, examination of patient, ordering and performing treatments and interventions, ordering and review of laboratory studies, ordering and review of radiographic studies, pulse oximetry, re-evaluation of patient's condition, obtaining history from patient or surrogate and review of old charts   (including critical care time)  Medications Ordered in ED Medications  fentaNYL (SUBLIMAZE) injection 40 mcg (40 mcg Intravenous Given 07/30/18 0015)  divalproex (DEPAKOTE ER) 24 hr tablet 500 mg (500 mg Oral  Not Given 07/29/18 2138)  acetaminophen (TYLENOL) tablet 500 mg (has no administration in time range)  FLUoxetine (PROZAC) capsule 40 mg (40 mg Oral Given 07/29/18 0831)   pantoprazole (PROTONIX) EC tablet 40 mg (40 mg Oral Given 07/29/18 2135)  apixaban (ELIQUIS) tablet 5 mg (5 mg Oral Given 07/29/18 2135)  loratadine (CLARITIN) tablet 10 mg (10 mg Oral Given 07/29/18 0830)  guaiFENesin (MUCINEX) 12 hr tablet 600 mg (600 mg Oral Given 07/29/18 2135)  budesonide (PULMICORT) nebulizer solution 0.25 mg (0.25 mg Nebulization Given 07/29/18 2038)  sodium chloride flush (NS) 0.9 % injection 3 mL (3 mLs Intravenous Not Given 07/29/18 0831)  sodium chloride flush (NS) 0.9 % injection 3 mL (3 mLs Intravenous Given 07/27/18 1700)  0.9 %  sodium chloride infusion (has no administration in time range)  ondansetron (ZOFRAN) tablet 4 mg (has no administration in time range)    Or  ondansetron (ZOFRAN) injection 4 mg (has no administration in time range)  guaiFENesin-dextromethorphan (ROBITUSSIN DM) 100-10 MG/5ML syrup 5 mL (5 mLs Oral Given 07/30/18 0020)  piperacillin-tazobactam (ZOSYN) IVPB 3.375 g (3.375 g Intravenous New Bag/Given 07/29/18 2139)  metoprolol tartrate (LOPRESSOR) injection 5 mg (5 mg Intravenous Not Given 07/27/18 2250)  levalbuterol (XOPENEX) nebulizer solution 1.25 mg (1.25 mg Nebulization Given 07/30/18 0131)  ipratropium (ATROVENT) nebulizer solution 0.5 mg (0.5 mg Nebulization Given 07/30/18 0131)  methylPREDNISolone sodium succinate (SOLU-MEDROL) 40 mg/mL injection 40 mg (40 mg Intravenous Given 07/29/18 2132)  levofloxacin (LEVAQUIN) IVPB 500 mg (500 mg Intravenous New Bag/Given 07/29/18 1020)  benzonatate (TESSALON) capsule 200 mg (200 mg Oral Given 07/29/18 2135)  lidocaine (LIDODERM) 5 % 1 patch (1 patch Transdermal Patch Applied 07/29/18 1456)  HYDROcodone-acetaminophen (NORCO/VICODIN) 5-325 MG per tablet 1-2 tablet (2 tablets Oral Given 07/29/18 2323)  fentaNYL (SUBLIMAZE) injection 50 mcg (50 mcg Intramuscular Not Given 07/29/18 2138)  piperacillin-tazobactam (ZOSYN) IVPB 3.375 g (0 g Intravenous Stopped 07/27/18 1243)  sodium chloride 0.9 % bolus 1,000 mL (0  mLs Intravenous Stopped 07/27/18 1327)  vancomycin (VANCOCIN) IVPB 1000 mg/200 mL premix (0 mg Intravenous Stopped 07/27/18 1320)  iohexol (OMNIPAQUE) 350 MG/ML injection 100 mL (100 mLs Intravenous Contrast Given 07/27/18 1423)  potassium chloride 10 mEq in 100 mL IVPB (0 mEq Intravenous Stopped 07/27/18 1515)  potassium chloride SA (K-DUR) CR tablet 40 mEq (40 mEq Oral Given 07/27/18 1414)     Initial Impression / Assessment and Plan / ED Course  I have reviewed the triage vital signs and the nursing notes.  Pertinent labs & imaging results that were available during my care of the patient were reviewed by me and considered in my medical decision making (see chart for details).     Patient presents with respiratory difficulty hypoxic 70% on room air, patient improved with 4 to 5 L nasal cannula.  Patient is coughing and dyspneic in the room.  Concern clinically for hospital-acquired pneumonia versus CO VID versus pulmonary embolism versus other.  Sepsis blood work ordered.  CO VID test ordered and pending.  Patient placed immediately on respiratory/droplet precautions.  HCAP IV abx given, broad spectrum.   Blood work reviewed, K2 .9 IV ordered, Calcium mild low 8.8, WBC 13.8 possibly from infection, Hb 11.8 no active bleeding.  CXR concerning for possible pneumonia.  CTA pending.  Patient admitted to hospitalist to telemetry.   Patient improved on reassessment down to 3 L [place] O2.   DELANI KOHLI was evaluated in Emergency Department on 07/30/2018 for the symptoms described in the  history of present illness. She was evaluated in the context of the global COVID-19 pandemic, which necessitated consideration that the patient might be at risk for infection with the SARS-CoV-2 virus that causes COVID-19. Institutional protocols and algorithms that pertain to the evaluation of patients at risk for COVID-19 are in a state of rapid change based on information released by regulatory bodies including the  CDC and federal and state organizations. These policies and algorithms were followed during the patient's care in the ED.   Final Clinical Impressions(s) / ED Diagnoses   Final diagnoses:  HCAP (healthcare-associated pneumonia)  Hypoxia    ED Discharge Orders    None       Elnora Morrison, MD 07/30/18 [phone]

## 2018-07-27 NOTE — H&P (Addendum)
History and Physical    Christine Ortega DOB: 04-24-1960 DOA: 07/27/2018  PCP: Lucia Gaskins, MD   Patient coming from: Home  Chief Complaint: Dyspnea and cough  HPI: Christine Ortega is a 58 y.o. female with medical history significant for anxiety/depression, large-gastric ulcer, prior DVT on Eliquis, bipolar disorder, schizophrenia, and chronic pain who presented to the emergency department with worsening dyspnea and hypoxemia checked by home health nurse with saturations in the 70th percentile.  She has had increased work of breathing recently and persistent dry cough with no hemoptysis or chest pain.  She denies any sick contacts and has been at home since her last discharge with no recent travel.  She denies any fevers or chills and has no significant lower extremity edema.   ED Course: Vital signs are stable and patient was noted to be in the 70th percentile on room air and is currently on 4 to 5 L nasal cannula.  Chest x-ray with some right-sided acute on chronic lung infiltration noted.  Lactic acid of 1.1.  Leukocytosis of 13,800 noted.  Potassium is 2.9.  Patient has been started on vancomycin and Zosyn and COVID testing noted to be negative.  CTA of the chest ordered and pending.  Review of Systems: All others reviewed and otherwise negative.  Past Medical History:  Diagnosis Date   ADD (attention deficit disorder)    Anxiety    Bipolar disorder ([place])    Chronic pain    Depression    Narcotic abuse in remission ([place])    Schizophrenia ([place])    Seizures ([place])     Past Surgical History:  Procedure Laterality Date   BACK SURGERY     ESOPHAGOGASTRODUODENOSCOPY N/A 05/19/2018   Procedure: ESOPHAGOGASTRODUODENOSCOPY (EGD);  Surgeon: Yetta Flock, MD;  Location: [facility] ENDOSCOPY;  Service: Gastroenterology;  Laterality: N/A;  egd at bedside in ICU, intubated   IR [place]  05/20/2018   IR ANGIOGRAM SELECTIVE EACH  ADDITIONAL VESSEL  05/20/2018   IR ANGIOGRAM VISCERAL SELECTIVE  05/20/2018   IR ANGIOGRAM VISCERAL SELECTIVE  05/20/2018   IR EMBO ART  VEN HEMORR LYMPH EXTRAV  INC GUIDE ROADMAPPING  05/20/2018   IR US GUIDE VASC ACCESS LEFT  05/20/2018     reports that she has quit smoking. She smoked 0.50 packs per day. She has never used smokeless tobacco. She reports that she does not drink alcohol or use drugs.  Allergies  Allergen Reactions   Nsaids Other (See Comments)    Causes GI bleeding   Tramadol Hcl     Per Daughter- patient has " sleep paralysis"     History reviewed. No pertinent family history.  Prior to Admission medications   Medication Sig Start Date End Date Taking? Authorizing Provider  acetaminophen (TYLENOL) 500 MG tablet Take 1 tablet (500 mg total) by mouth every 8 (eight) hours as needed for moderate pain. 06/08/18 06/08/19 Yes Thurnell Lose, MD  apixaban (ELIQUIS) 5 MG TABS tablet Take 1 tablet (5 mg total) by mouth 2 (two) times daily. 2 pills mouth twice a day till 06/12/2018 then switch to 1 pill twice a day from 06/13/2018 Patient taking differently: Take 10 mg by mouth 2 (two) times daily.  06/08/18  Yes Thurnell Lose, MD  cetirizine (ZYRTEC) 10 MG tablet Take 10 mg by mouth daily.   Yes [provider]  FLUoxetine (PROZAC) 40 MG capsule Take 1 capsule (40 mg total) by mouth daily. For depression  and anxiety. 08/08/11  Yes Mashburn, Marlane Hatcher, PA-C  guaiFENesin (MUCINEX) 600 MG 12 hr tablet Take 800 mg by mouth 2 (two) times daily.    Yes [provider]  pantoprazole (PROTONIX) 40 MG tablet Take 1 tablet (40 mg total) by mouth 2 (two) times daily. 06/08/18  Yes Thurnell Lose, MD  Probiotic Product (PROBIOTIC PO) Take 1 tablet by mouth daily. Women's probiotic for GI health   Yes [provider]  divalproex (DEPAKOTE ER) 500 MG 24 hr tablet Take 500 mg by mouth at bedtime. For moods 03/08/18   [provider]  lactose free nutrition  (BOOST PLUS) LIQD Take 237 mLs by mouth 3 (three) times daily with meals. Patient not taking: Reported on 07/27/2018 06/08/18   Thurnell Lose, MD  Melatonin 5 MG CAPS Take 20 mg by mouth at bedtime.     [provider]  metoprolol tartrate (LOPRESSOR) 25 MG tablet Take 1 tablet (25 mg total) by mouth 2 (two) times daily. Patient not taking: Reported on 07/27/2018 06/08/18   Thurnell Lose, MD  ziprasidone (GEODON) 80 MG capsule Take one capsule at bedtime for depression and mood stability. Patient not taking: Reported on 07/27/2018 08/08/11   Pamelia Hoit    Physical Exam: Vitals:   07/27/18 1220 07/27/18 1230 07/27/18 1245 07/27/18 1300  BP: 118/85 119/83  112/80  Pulse: (!) 111 (!) 105 (!) 102 (!) 103  Resp: (!) 27 14 (!) 23 (!) 24  Temp: 99 F (37.2 C)     TempSrc: Rectal     SpO2: 100% 100% 100% 100%  Weight:      Height:        Constitutional: NAD, calm, comfortable Vitals:   07/27/18 1220 07/27/18 1230 07/27/18 1245 07/27/18 1300  BP: 118/85 119/83  112/80  Pulse: (!) 111 (!) 105 (!) 102 (!) 103  Resp: (!) 27 14 (!) 23 (!) 24  Temp: 99 F (37.2 C)     TempSrc: Rectal     SpO2: 100% 100% 100% 100%  Weight:      Height:       Eyes: lids and conjunctivae normal ENMT: Mucous membranes are moist.  Neck: normal, supple Respiratory: clear to auscultation bilaterally. Normal respiratory effort. No accessory muscle use.  Minimal bronchospasms and wheezing noted diffusely.  Currently on 4 to 5 L nasal cannula. Cardiovascular: Regular rate and rhythm-tachycardic, no murmurs. No extremity edema. Abdomen: no tenderness, no distention. Bowel sounds positive.  Musculoskeletal:  No joint deformity upper and lower extremities.   Skin: no rashes, lesions, ulcers.  Psychiatric: Normal judgment and insight. Alert and oriented x 3. Normal mood.   Labs on Admission: I have personally reviewed following labs and imaging studies  CBC: Recent Labs  Lab 07/27/18 1206   WBC 13.8*  NEUTROABS 7.7  HGB 11.8*  HCT 37.0  MCV 96.4  PLT 767*   Basic Metabolic Panel: Recent Labs  Lab 07/27/18 1206 07/27/18 1400  NA 140  --   K 2.9*  --   CL 105  --   CO2 26  --   GLUCOSE 114*  --   BUN 16  --   CREATININE 0.57  --   CALCIUM 8.8*  --   MG  --  1.9   GFR: Estimated Creatinine Clearance: 64.2 mL/min (by C-G formula based on SCr of 0.57 mg/dL). Liver Function Tests: Recent Labs  Lab 07/27/18 1206  AST 22  ALT 13  ALKPHOS  109  BILITOT 0.2*  PROT 6.2*  ALBUMIN 2.7*   No results for input(s): LIPASE, AMYLASE in the last 168 hours. No results for input(s): AMMONIA in the last 168 hours. Coagulation Profile: No results for input(s): INR, PROTIME in the last 168 hours. Cardiac Enzymes: Recent Labs  Lab 07/27/18 1206  TROPONINI <0.03   BNP (last 3 results) No results for input(s): PROBNP in the last 8760 hours. HbA1C: No results for input(s): HGBA1C in the last 72 hours. CBG: No results for input(s): GLUCAP in the last 168 hours. Lipid Profile: No results for input(s): CHOL, HDL, LDLCALC, TRIG, CHOLHDL, LDLDIRECT in the last 72 hours. Thyroid Function Tests: No results for input(s): TSH, T4TOTAL, FREET4, T3FREE, THYROIDAB in the last 72 hours. Anemia Panel: No results for input(s): VITAMINB12, FOLATE, FERRITIN, TIBC, IRON, RETICCTPCT in the last 72 hours. Urine analysis:    Component Value Date/Time   COLORURINE YELLOW 05/19/2018 0921   APPEARANCEUR CLOUDY (A) 05/19/2018 0921   LABSPEC 1.008 05/19/2018 0921   PHURINE 6.0 05/19/2018 0921   GLUCOSEU NEGATIVE 05/19/2018 0921   HGBUR SMALL (A) 05/19/2018 0921   BILIRUBINUR NEGATIVE 05/19/2018 0921   KETONESUR NEGATIVE 05/19/2018 0921   PROTEINUR 30 (A) 05/19/2018 0921   NITRITE NEGATIVE 05/19/2018 0921   LEUKOCYTESUR LARGE (A) 05/19/2018 0921    Radiological Exams on Admission: Ct Angio Chest Pe W And/or Wo Contrast  Result Date: 07/27/2018 CLINICAL DATA:  Shortness of breath  and pain. Decreased oxygen saturation EXAM: CT ANGIOGRAPHY CHEST WITH CONTRAST TECHNIQUE: Multidetector CT imaging of the chest was performed using the standard protocol during bolus administration of intravenous contrast. Multiplanar CT image reconstructions and MIPs were obtained to evaluate the vascular anatomy. CONTRAST:  148mL OMNIPAQUE IOHEXOL 350 MG/ML SOLN COMPARISON:  Chest CT May 23, 2018; chest radiograph July 27, 2018 FINDINGS: Cardiovascular: There is no demonstrable pulmonary embolus. There is no thoracic aortic aneurysm or dissection. The visualized great vessels appear unremarkable except for slight calcification at the origins of the respective great vessels. There is mild aortic atherosclerosis. There is no pericardial effusion or pericardial thickening. Mediastinum/Nodes: Thyroid appears unremarkable. There are multiple subcentimeter mediastinal lymph nodes. There is a precarinal lymph node measuring 1.1 x 1.1 cm. There is an aortopulmonary window lymph node measuring 1.4 x 1.0 cm. There is a subcarinal lymph node measuring 1.0 x 1.0 cm. There is a right hilar lymph node measuring 1.1 x 1.0 cm. There is thickening throughout the wall of the mid and distal thirds of the esophagus. Lungs/Pleura: There is widespread fibrosis throughout the lungs diffusely with scattered small bullae. There is no frank consolidation. There is no appreciable pleural effusion or pleural thickening. Upper Abdomen: There are surgical clips in the region of the porta. Visualized upper abdominal structures otherwise appear unremarkable. Musculoskeletal: There are no blastic or lytic bone lesions. There are no appreciable chest wall lesions. Review of the MIP images confirms the above findings. IMPRESSION: 1. No demonstrable pulmonary embolus. No thoracic aortic aneurysm or dissection. There is mild aortic atherosclerosis. 2. Extensive fibrosis throughout the lungs diffusely. No frank edema or consolidation. 3. Several  prominent lymph nodes which may well be of reactive etiology given the extensive parenchymal lung changes. 4. Thickening of the esophageal wall throughout the mid and distal thirds. Suspect a degree of esophagitis. Aortic Atherosclerosis (ICD10-I70.0). Electronically Signed   By: Lowella Grip III M.D.   On: 07/27/2018 14:45   Dg Chest Portable 1 View  Result Date: 07/27/2018 CLINICAL DATA:  Shortness of breath EXAM: PORTABLE CHEST 1 VIEW COMPARISON:  06/06/2018 FINDINGS: Cardiac shadow is stable. Diffuse fibrotic changes are noted throughout both lungs somewhat increased when compared with the prior exam. This may represent some mild acute on chronic infiltrate particularly in the right lung. No sizable effusion is seen. No bony abnormality is noted. IMPRESSION: Increased density in the right lung representing some acute on chronic infiltrate. Electronically Signed   By: Inez Catalina M.D.   On: 07/27/2018 11:36    Assessment/Plan Active Problems:   Acute hypoxemic respiratory failure ([place])    1. Acute hypoxemic respiratory failure secondary to right-sided pneumonia.  Continue on vancomycin and Zosyn for now and follow cultures and MRSA screening to further tailor antibiotics.  Continue on nasal cannula for now and wean as tolerated.  Patient is supposed to wear home oxygen, but never received it.  Will start on Pulmicort for now as well as breathing treatments and avoid IV steroids unless absolutely needed. 2. Hypokalemia.  Replete and recheck in a.m. 3. Prior right lower extremity DVT and left upper extremity DVT.  Continue on home Eliquis. 4. ADD/bipolar/depression/schizophrenia.  Continue home medications. 5. Dysphagia.  Continue on dysphagia 1 diet and reassess while here.   DVT prophylaxis: Eliquis Code Status: Full Family Communication: None at bedside Disposition Plan: Admit for pneumonia treatment and bronchospasms Consults called: None Admission status: Inpatient,  telemetry   [facility] Pager [phone]  If 7PM-7AM, please contact night-coverage [email or web address] Password [facility]  07/27/2018, 2:55 PM

## 2018-07-27 NOTE — ED Notes (Signed)
Patient transported to CT 

## 2018-07-27 NOTE — Progress Notes (Signed)
Pt refused to take 2200 depakote ER 24hr tablet 500mg . Nurse asked pt does she know why she takes depakote pt said yes and states she no longer takes depakote and doest have seizures. Nurse attempted to give pt medication twice and pt still refused. Mid level made aware. Will continue to monitor throughout shift.

## 2018-07-27 NOTE — Progress Notes (Signed)
Mid level placed one time order for lopressor 5mg  IV due to pts heart rate going up to 130's. Will continue to monitor throughout shift.

## 2018-07-27 NOTE — Progress Notes (Signed)
Blood cultures where drawn prior to antibiotics being given.  Cultures where drawn at time of IV insertion at 1200.

## 2018-07-27 NOTE — Progress Notes (Signed)
Pharmacy Antibiotic Note  Christine Ortega is a 58 y.o. female admitted on 07/27/2018 with pneumonia.  Pharmacy has been consulted for Vancomycin and zosyn dosing.  Plan: Vancomycin 1000mg  IV loading dose, then 750mg  IV every 12 hours.  Goal trough 15-20 mcg/mL. Zosyn 3.375g IV q8h (4 hour infusion).  F/U cxs and clinical progress Monitor V/S, labs and levels as indicated  Height: 5\' 3"  (160 cm) Weight: 120 lb (54.4 kg) IBW/kg (Calculated) : 52.4  Temp (24hrs), Avg:98.5 F (36.9 C), Min:98 F (36.7 C), Max:99 F (37.2 C)  Recent Labs  Lab 07/27/18 1206 07/27/18 1210 07/27/18 1400  WBC 13.8*  --   --   CREATININE 0.57  --   --   LATICACIDVEN  --  1.1 0.8    Estimated Creatinine Clearance: 64.2 mL/min (by C-G formula based on SCr of 0.57 mg/dL).    Allergies  Allergen Reactions  . Nsaids Other (See Comments)    Causes GI bleeding  . Tramadol Hcl     Per Daughter- patient has " sleep paralysis"     Antimicrobials this admission: Vancomycin 4/24 >>  Zosyn 4/24 >>   Dose adjustments this admission: n/a  Microbiology results: 4/24 BCx: pending 4/24 MRSA PCR: pending 4/24 SARS Coronavirus 2 : negative  Thank you for allowing pharmacy to be a part of this patient's care.  Isac Sarna, BS [place], [place] Clinical Pharmacist Pager [phone] 07/27/2018 3:18 PM

## 2018-07-27 NOTE — Progress Notes (Signed)
Pts heart rate is sustaining in the 130's pt is at rest. Paged mid level. Awaiting orders .Will continue to monitor throughout shift.

## 2018-07-28 LAB — CBC
HCT: 35.2 % — ABNORMAL LOW (ref 36.0–46.0)
Hemoglobin: 11 g/dL — ABNORMAL LOW (ref 12.0–15.0)
MCH: 30.6 pg (ref 26.0–34.0)
MCHC: 31.3 g/dL (ref 30.0–36.0)
MCV: 98.1 fL (ref 80.0–100.0)
Platelets: 532 10*3/uL — ABNORMAL HIGH (ref 150–400)
RBC: 3.59 MIL/uL — ABNORMAL LOW (ref 3.87–5.11)
RDW: 16.8 % — ABNORMAL HIGH (ref 11.5–15.5)
WBC: 12.6 10*3/uL — ABNORMAL HIGH (ref 4.0–10.5)
nRBC: 0 % (ref 0.0–0.2)

## 2018-07-28 LAB — BASIC METABOLIC PANEL
Anion gap: 7 (ref 5–15)
BUN: 6 mg/dL (ref 6–20)
CO2: 25 mmol/L (ref 22–32)
Calcium: 8.6 mg/dL — ABNORMAL LOW (ref 8.9–10.3)
Chloride: 108 mmol/L (ref 98–111)
Creatinine, Ser: 0.45 mg/dL (ref 0.44–1.00)
GFR calc Af Amer: >60 mL/min (ref >60)
GFR calc non Af Amer: >60 mL/min (ref >60)
Glucose, Bld: 89 mg/dL (ref 70–99)
Potassium: 3.8 mmol/L (ref 3.5–5.1)
Sodium: 140 mmol/L (ref 135–145)

## 2018-07-28 LAB — MAGNESIUM: Magnesium: 1.7 mg/dL (ref 1.7–2.4)

## 2018-07-28 LAB — SEDIMENTATION RATE: Sed Rate: 39 mm/hr — ABNORMAL HIGH (ref 0–22)

## 2018-07-28 MED ORDER — BENZONATATE 100 MG PO CAPS
200.0000 mg | ORAL_CAPSULE | Freq: Three times a day (TID) | ORAL | Status: DC
  Administered 2018-07-28 – 2018-08-02 (×15): 200 mg via ORAL
  Filled 2018-07-28 (×15): qty 2

## 2018-07-28 MED ORDER — LEVOFLOXACIN IN D5W 500 MG/100ML IV SOLN
500.0000 mg | INTRAVENOUS | Status: DC
  Administered 2018-07-28 – 2018-08-02 (×6): 500 mg via INTRAVENOUS
  Filled 2018-07-28 (×6): qty 100

## 2018-07-28 MED ORDER — METHYLPREDNISOLONE SODIUM SUCC 40 MG IJ SOLR
40.0000 mg | Freq: Two times a day (BID) | INTRAMUSCULAR | Status: DC
  Administered 2018-07-28 – 2018-08-02 (×11): 40 mg via INTRAVENOUS
  Filled 2018-07-28 (×11): qty 1

## 2018-07-28 MED ORDER — HYDROCODONE-ACETAMINOPHEN 5-325 MG PO TABS
1.0000 | ORAL_TABLET | Freq: Four times a day (QID) | ORAL | Status: DC
  Administered 2018-07-28 – 2018-07-29 (×4): 1 via ORAL
  Filled 2018-07-28 (×5): qty 1

## 2018-07-28 NOTE — Progress Notes (Signed)
PROGRESS NOTE    Christine Ortega  KXF:[id number] DOB: [birth year] DOA: 07/27/2018 PCP: Lucia Gaskins, MD   Brief Narrative:  Per HPI: Christine Ortega is a 58 y.o. female with medical history significant for anxiety/depression, large-gastric ulcer, prior DVT on Eliquis, bipolar disorder, schizophrenia, and chronic pain who presented to the emergency department with worsening dyspnea and hypoxemia checked by home health nurse with saturations in the 70th percentile.  She has had increased work of breathing recently and persistent dry cough with no hemoptysis or chest pain.  She denies any sick contacts and has been at home since her last discharge with no recent travel.  She denies any fevers or chills and has no significant lower extremity edema.  She was admitted with acute hypoxemic respiratory failure secondary to right-sided pneumonia, but has CT chest findings of interstitial fibrosis and possibly interstitial pneumonia.  Appreciate pulmonology evaluation this a.m. with addition of Levaquin and ESR pending.  IV Solu-Medrol also added.  She continues to remain severely symptomatic.  Assessment & Plan:   Active Problems:   Acute hypoxemic respiratory failure ([place])   1. Acute hypoxemic respiratory failure secondary to right-sided pneumonia and possible interstitial process with significant fibrosis noted on CT chest.    Discontinue vancomycin due to MRSA nares negative.  Continue on Zosyn as well as Levaquin added by pulmonology and follow ESR.  Respiratory panel noted to be negative.  COVID-19 testing noted to be negative as well.  Start IV steroids today and continue with duo nebs as well as Pulmicort.  Pain management for chest and back given recurrent cough. 2. Hypokalemia-resolved. Recheck in am. 3. Prior right lower extremity DVT and left upper extremity DVT.  Continue on home Eliquis. 4. ADD/bipolar/depression/schizophrenia.  Continue home medications. 5. Dysphagia.  Continue on  dysphagia 1 diet and reassess while here. Will order SLP evaluation. 6. Chronic pain.  Start on around-the-clock narcotic pain medication for assistance in pain management with IV narcotics for breakthrough.   DVT prophylaxis: Eliquis Code Status: Full Family Communication: None at bedside Disposition Plan: Appreciate pulmonology recommendations.  Continue on current IV antibiotics and monitor for symptomatic improvement.   Consultants:   Pulmonology  Procedures:   None  Antimicrobials:   Zosyn 4/24->  Vancomycin 4/24-4/25  Levaquin 4/25->   Subjective: Patient seen and evaluated today with ongoing chest wall and back pain and significant fits of coughing.  She continues to remain somewhat tachycardic which she states is usual for her.  Objective: Vitals:   07/27/18 2122 07/28/18 0141 07/28/18 0433 07/28/18 0820  BP: 121/88  121/76   Pulse: (!) 128  (!) 121   Resp: 16  16   Temp: 98 F (36.7 C)  97.8 F (36.6 C)   TempSrc: Oral  Oral   SpO2: 95% 91% 98% 94%  Weight:      Height:        Intake/Output Summary (Last 24 hours) at 07/28/2018 1115 Last data filed at 07/28/2018 0900 Gross per 24 hour  Intake 1400 ml  Output 3150 ml  Net -1750 ml   Filed Weights   07/27/18 1102 07/27/18 1627  Weight: 54.4 kg 54.5 kg    Examination:  General exam: Appears mildly distressed Respiratory system: Clear to auscultation. Respiratory effort normal.  Currently on nasal cannula Cardiovascular system: S1 & S2 heard, RRR. No JVD, murmurs, rubs, gallops or clicks. No pedal edema. Gastrointestinal system: Abdomen is nondistended, soft and nontender. No organomegaly or masses felt. Normal bowel sounds  heard. Central nervous system: Alert and oriented. No focal neurological deficits. Extremities: Symmetric 5 x 5 power. Skin: No rashes, lesions or ulcers Psychiatry: Judgement and insight appear normal. Mood & affect appropriate.     Data Reviewed: I have personally reviewed  following labs and imaging studies  CBC: Recent Labs  Lab 07/27/18 1206 07/28/18 0620  WBC 13.8* 12.6*  NEUTROABS 7.7  --   HGB 11.8* 11.0*  HCT 37.0 35.2*  MCV 96.4 98.1  PLT 594* 382*   Basic Metabolic Panel: Recent Labs  Lab 07/27/18 1206 07/27/18 1400 07/28/18 0620  NA 140  --  140  K 2.9*  --  3.8  CL 105  --  108  CO2 26  --  25  GLUCOSE 114*  --  89  BUN 16  --  6  CREATININE 0.57  --  0.45  CALCIUM 8.8*  --  8.6*  MG  --  1.9 1.7   GFR: Estimated Creatinine Clearance: 64.2 mL/min (by C-G formula based on SCr of 0.45 mg/dL). Liver Function Tests: Recent Labs  Lab 07/27/18 1206  AST 22  ALT 13  ALKPHOS 109  BILITOT 0.2*  PROT 6.2*  ALBUMIN 2.7*   No results for input(s): LIPASE, AMYLASE in the last 168 hours. No results for input(s): AMMONIA in the last 168 hours. Coagulation Profile: No results for input(s): INR, PROTIME in the last 168 hours. Cardiac Enzymes: Recent Labs  Lab 07/27/18 1206  TROPONINI <0.03   BNP (last 3 results) No results for input(s): PROBNP in the last 8760 hours. HbA1C: No results for input(s): HGBA1C in the last 72 hours. CBG: No results for input(s): GLUCAP in the last 168 hours. Lipid Profile: No results for input(s): CHOL, HDL, LDLCALC, TRIG, CHOLHDL, LDLDIRECT in the last 72 hours. Thyroid Function Tests: No results for input(s): TSH, T4TOTAL, FREET4, T3FREE, THYROIDAB in the last 72 hours. Anemia Panel: No results for input(s): VITAMINB12, FOLATE, FERRITIN, TIBC, IRON, RETICCTPCT in the last 72 hours. Sepsis Labs: Recent Labs  Lab 07/27/18 1210 07/27/18 1400  PROCALCITON  --  5.87  LATICACIDVEN 1.1 0.8    Recent Results (from the past 240 hour(s))  SARS Coronavirus 2 [facility] order, Performed in [place] hospital lab)     Status: None   Collection Time: 07/27/18 11:36 AM  Result Value Ref Range Status   SARS Coronavirus 2 NEGATIVE NEGATIVE Final    Comment: (NOTE) If result is NEGATIVE SARS-CoV-2  target nucleic acids are NOT DETECTED. The SARS-CoV-2 RNA is generally detectable in upper and lower  respiratory specimens during the acute phase of infection. The lowest  concentration of SARS-CoV-2 viral copies this assay can detect is 250  copies / mL. A negative result does not preclude SARS-CoV-2 infection  and should not be used as the sole basis for treatment or other  patient management decisions.  A negative result may occur with  improper specimen collection / handling, submission of specimen other  than nasopharyngeal swab, presence of viral mutation(s) within the  areas targeted by this assay, and inadequate number of viral copies  (<250 copies / mL). A negative result must be combined with clinical  observations, patient history, and epidemiological information. If result is POSITIVE SARS-CoV-2 target nucleic acids are DETECTED. The SARS-CoV-2 RNA is generally detectable in upper and lower  respiratory specimens dur ing the acute phase of infection.  Positive  results are indicative of active infection with SARS-CoV-2.  Clinical  correlation with patient history and  other diagnostic information is  necessary to determine patient infection status.  Positive results do  not rule out bacterial infection or co-infection with other viruses. If result is PRESUMPTIVE POSTIVE SARS-CoV-2 nucleic acids MAY BE PRESENT.   A presumptive positive result was obtained on the submitted specimen  and confirmed on repeat testing.  While 2019 novel coronavirus  (SARS-CoV-2) nucleic acids may be present in the submitted sample  additional confirmatory testing may be necessary for epidemiological  and / or clinical management purposes  to differentiate between  SARS-CoV-2 and other Sarbecovirus currently known to infect humans.  If clinically indicated additional testing with an alternate test  methodology [phone]) is advised. The SARS-CoV-2 RNA is generally  detectable in upper and lower  respiratory sp ecimens during the acute  phase of infection. The expected result is Negative. Fact Sheet for Patients:  StrictlyIdeas.no Fact Sheet for Healthcare Providers: BankingDealers.co.za This test is not yet approved or cleared by the [place] FDA and has been authorized for detection and/or diagnosis of SARS-CoV-2 by FDA under an Emergency Use Authorization (EUA).  This EUA will remain in effect (meaning this test can be used) for the duration of the COVID-19 declaration under Section 564(b)(1) of the Act, 21 U.S.C. section 360bbb-3(b)(1), unless the authorization is terminated or revoked sooner. Performed at [facility], [address] [place], [place] [address]   Blood culture (routine x 2)     Status: None (Preliminary result)   Collection Time: 07/27/18 12:09 PM  Result Value Ref Range Status   Specimen Description   Final    BLOOD RIGHT ARM BOTTLES DRAWN AEROBIC AND ANAEROBIC   Special Requests   Final    Blood Culture results may not be optimal due to an excessive volume of blood received in culture bottles   Culture   Final    NO GROWTH < 24 HOURS Performed at [facility], [address], [place], [place] [address]    Report Status PENDING  Incomplete  Blood culture (routine x 2)     Status: None (Preliminary result)   Collection Time: 07/27/18 12:18 PM  Result Value Ref Range Status   Specimen Description BLOOD LEFT ARM BOTTLES DRAWN AEROBIC AND ANAEROBIC  Final   Special Requests Blood Culture adequate volume  Final   Culture   Final    NO GROWTH < 24 HOURS Performed at [facility], [address], [place], [place] [address]    Report Status PENDING  Incomplete  Respiratory Panel by PCR     Status: None   Collection Time: 07/27/18  3:14 PM  Result Value Ref Range Status   Adenovirus NOT DETECTED NOT DETECTED Final   Coronavirus 229E NOT DETECTED NOT DETECTED Final    Comment: (NOTE) The Coronavirus on  the Respiratory Panel, DOES NOT test for the novel  Coronavirus (2019 nCoV)    Coronavirus HKU1 NOT DETECTED NOT DETECTED Final   Coronavirus NL63 NOT DETECTED NOT DETECTED Final   Coronavirus OC43 NOT DETECTED NOT DETECTED Final   Metapneumovirus NOT DETECTED NOT DETECTED Final   Rhinovirus / Enterovirus NOT DETECTED NOT DETECTED Final   Influenza A NOT DETECTED NOT DETECTED Final   Influenza B NOT DETECTED NOT DETECTED Final   Parainfluenza Virus 1 NOT DETECTED NOT DETECTED Final   Parainfluenza Virus 2 NOT DETECTED NOT DETECTED Final   Parainfluenza Virus 3 NOT DETECTED NOT DETECTED Final   Parainfluenza Virus 4 NOT DETECTED NOT DETECTED Final   Respiratory Syncytial Virus NOT  DETECTED NOT DETECTED Final   Bordetella pertussis NOT DETECTED NOT DETECTED Final   Chlamydophila pneumoniae NOT DETECTED NOT DETECTED Final   Mycoplasma pneumoniae NOT DETECTED NOT DETECTED Final    Comment: Performed at [facility], [place] [address], [place], [place] [address]  MRSA PCR Screening     Status: None   Collection Time: 07/27/18  3:14 PM  Result Value Ref Range Status   MRSA by PCR NEGATIVE NEGATIVE Final    Comment:        The GeneXpert MRSA Assay (FDA approved for NASAL specimens only), is one component of a comprehensive MRSA colonization surveillance program. It is not intended to diagnose MRSA infection nor to guide or monitor treatment for MRSA infections. Performed at [facility], [address] [place], [place] [address]          Radiology Studies: Ct Angio Chest Pe W And/or Wo Contrast  Result Date: 07/27/2018 CLINICAL DATA:  Shortness of breath and pain. Decreased oxygen saturation EXAM: CT ANGIOGRAPHY CHEST WITH CONTRAST TECHNIQUE: Multidetector CT imaging of the chest was performed using the standard protocol during bolus administration of intravenous contrast. Multiplanar CT image reconstructions and MIPs were obtained to evaluate the vascular anatomy. CONTRAST:   158m OMNIPAQUE IOHEXOL 350 MG/ML SOLN COMPARISON:  Chest CT May 23, 2018; chest radiograph July 27, 2018 FINDINGS: Cardiovascular: There is no demonstrable pulmonary embolus. There is no thoracic aortic aneurysm or dissection. The visualized great vessels appear unremarkable except for slight calcification at the origins of the respective great vessels. There is mild aortic atherosclerosis. There is no pericardial effusion or pericardial thickening. Mediastinum/Nodes: Thyroid appears unremarkable. There are multiple subcentimeter mediastinal lymph nodes. There is a precarinal lymph node measuring 1.1 x 1.1 cm. There is an aortopulmonary window lymph node measuring 1.4 x 1.0 cm. There is a subcarinal lymph node measuring 1.0 x 1.0 cm. There is a right hilar lymph node measuring 1.1 x 1.0 cm. There is thickening throughout the wall of the mid and distal thirds of the esophagus. Lungs/Pleura: There is widespread fibrosis throughout the lungs diffusely with scattered small bullae. There is no frank consolidation. There is no appreciable pleural effusion or pleural thickening. Upper Abdomen: There are surgical clips in the region of the porta. Visualized upper abdominal structures otherwise appear unremarkable. Musculoskeletal: There are no blastic or lytic bone lesions. There are no appreciable chest wall lesions. Review of the MIP images confirms the above findings. IMPRESSION: 1. No demonstrable pulmonary embolus. No thoracic aortic aneurysm or dissection. There is mild aortic atherosclerosis. 2. Extensive fibrosis throughout the lungs diffusely. No frank edema or consolidation. 3. Several prominent lymph nodes which may well be of reactive etiology given the extensive parenchymal lung changes. 4. Thickening of the esophageal wall throughout the mid and distal thirds. Suspect a degree of esophagitis. Aortic Atherosclerosis (ICD10-I70.0). Electronically Signed   By: WLowella GripIII M.D.   On: 07/27/2018  14:45   Dg Chest Portable 1 View  Result Date: 07/27/2018 CLINICAL DATA:  Shortness of breath EXAM: PORTABLE CHEST 1 VIEW COMPARISON:  06/06/2018 FINDINGS: Cardiac shadow is stable. Diffuse fibrotic changes are noted throughout both lungs somewhat increased when compared with the prior exam. This may represent some mild acute on chronic infiltrate particularly in the right lung. No sizable effusion is seen. No bony abnormality is noted. IMPRESSION: Increased density in the right lung representing some acute on chronic infiltrate. Electronically Signed   By: MInez CatalinaM.D.   On:  07/27/2018 11:36        Scheduled Meds:  apixaban  5 mg Oral BID   benzonatate  200 mg Oral TID   budesonide (PULMICORT) nebulizer solution  0.25 mg Nebulization BID   divalproex  500 mg Oral QHS   FLUoxetine  40 mg Oral Daily   guaiFENesin  600 mg Oral BID   ipratropium  0.5 mg Nebulization Q6H WA   levalbuterol  1.25 mg Nebulization Q6H WA   loratadine  10 mg Oral Daily   methylPREDNISolone (SOLU-MEDROL) injection  40 mg Intravenous Q12H   metoprolol tartrate  5 mg Intravenous Once   pantoprazole  40 mg Oral BID   potassium chloride SA  40 mEq Oral TID   sodium chloride flush  3 mL Intravenous Q12H   Continuous Infusions:  sodium chloride     levofloxacin (LEVAQUIN) IV     piperacillin-tazobactam (ZOSYN)  IV 3.375 g (07/28/18 0604)     LOS: 1 day    Time spent: 30 minutes    Rickie Gange Darleen Crocker, DO [facility] Pager [phone]  If 7PM-7AM, please contact night-coverage [email or web address] Password [facility] 07/28/2018, 11:15 AM

## 2018-07-28 NOTE — Consult Note (Signed)
Consult requested by: [facility] hospitalist, Dr. Manuella Ghazi Consult requested for:: Respiratory failure  HPI: This is a 58 year old who has a recently complicated medical history with having been in the hospital for about 2 weeks and on the ventilator for about 10 days approximately 2 months ago.  She came to the hospital now because of increasing shortness of breath and hypoxia when her home nurse checked her at home.  She has had increasing work of breathing.  She is known to have had a large gastric ulcer had a DVT has bipolar disease and chronic pain.  Schizophrenia and seizures are also listed on her diagnoses.  She says that she has a significant smoking history of about 50 pack years.  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  She is unaware of any family history of lung disease.  During her hospitalization in [place] she had a CT done that looks markedly different than the CT that was done here now.  She did have some interstitial changes at that point but she now has severe interstitial changes that look fibrotic on CT.  I have personally reviewed both studies.  She says she feels a little better.  She is coughing and that is her major complaint is that she is having severe cough and has pain in her chest from that.  She is not able to cough anything up.  She does feel like it is moving a little bit.  She is still short of breath.  COVID testing and respiratory panel are negative  Past Medical History:  Diagnosis Date  . ADD (attention deficit disorder)   . Anxiety   . Bipolar disorder ([place])   . Chronic pain   . Depression   . Narcotic abuse in remission ([place])   . Schizophrenia ([place])   . Seizures ([place])      History reviewed. No pertinent family history.   Social History   Socioeconomic History  . Marital status: Single    Spouse name: Not on file  . Number of children: Not on file  . Years of education: Not on file  . Highest education level: Not on file  Occupational  History  . Not on file  Social Needs  . Financial resource strain: Not on file  . Food insecurity:    Worry: Not on file    Inability: Not on file  . Transportation needs:    Medical: Not on file    Non-medical: Not on file  Tobacco Use  . Smoking status: Former Smoker    Packs/day: 0.50  . Smokeless tobacco: Never Used  Substance and Sexual Activity  . Alcohol use: No  . Drug use: No  . Sexual activity: Never  Lifestyle  . Physical activity:    Days per week: Not on file    Minutes per session: Not on file  . Stress: Not on file  Relationships  . Social connections:    Talks on phone: Not on file    Gets together: Not on file    Attends religious service: Not on file    Active member of club or organization: Not on file    Attends meetings of clubs or organizations: Not on file    Relationship status: Not on file  Other Topics Concern  . Not on file  Social History Narrative  . Not on file     ROS: Except as mentioned 10 point review of systems is negative    Objective: Vital  signs in last 24 hours: Temp:  [97.8 F (36.6 C)-99 F (37.2 C)] 97.8 F (36.6 C) (04/25 0433) Pulse Rate:  [102-128] 121 (04/25 0433) Resp:  [14-31] 16 (04/25 0433) BP: (112-138)/(76-97) 121/76 (04/25 0433) SpO2:  [71 %-100 %] 94 % (04/25 0820) Weight:  [54.4 kg-54.5 kg] 54.5 kg (04/24 1627) Weight change:  Last BM Date: 07/26/18  Intake/Output from previous day: 04/24 0701 - 04/25 0700 In: 1160 [P.O.:960; IV Piggyback:200] Out: 2700 [Urine:2700]  PHYSICAL EXAM Constitutional: She is thin.  She is awake and alert.  She is coughing.  Eyes: Pupils react EOMI.  Ears nose mouth and throat: Her mucous membranes are dry.  Her neck is supple.  Hearing is grossly normal.  Cardiovascular: Her heart is regular with mild tachycardia.  Respiratory: Respiratory effort is increased.  She has marked bilateral crackles.  Gastrointestinal: Her abdomen is soft with no masses.  Skin: Warm and dry  neurological: No focal abnormalities.  Psychiatric: She is anxious skeletal skeletal: Grossly normal strength upper and lower extremities bilaterally  Lab Results: Basic Metabolic Panel: Recent Labs    07/27/18 1206 07/27/18 1400 07/28/18 0620  NA 140  --  140  K 2.9*  --  3.8  CL 105  --  108  CO2 26  --  25  GLUCOSE 114*  --  89  BUN 16  --  6  CREATININE 0.57  --  0.45  CALCIUM 8.8*  --  8.6*  MG  --  1.9 1.7   Liver Function Tests: Recent Labs    07/27/18 1206  AST 22  ALT 13  ALKPHOS 109  BILITOT 0.2*  PROT 6.2*  ALBUMIN 2.7*   No results for input(s): LIPASE, AMYLASE in the last 72 hours. No results for input(s): AMMONIA in the last 72 hours. CBC: Recent Labs    07/27/18 1206 07/28/18 0620  WBC 13.8* 12.6*  NEUTROABS 7.7  --   HGB 11.8* 11.0*  HCT 37.0 35.2*  MCV 96.4 98.1  PLT 594* 532*   Cardiac Enzymes: Recent Labs    07/27/18 1206  TROPONINI <0.03   BNP: No results for input(s): PROBNP in the last 72 hours. D-Dimer: No results for input(s): DDIMER in the last 72 hours. CBG: No results for input(s): GLUCAP in the last 72 hours. Hemoglobin A1C: No results for input(s): HGBA1C in the last 72 hours. Fasting Lipid Panel: No results for input(s): CHOL, HDL, LDLCALC, TRIG, CHOLHDL, LDLDIRECT in the last 72 hours. Thyroid Function Tests: No results for input(s): TSH, T4TOTAL, FREET4, T3FREE, THYROIDAB in the last 72 hours. Anemia Panel: No results for input(s): VITAMINB12, FOLATE, FERRITIN, TIBC, IRON, RETICCTPCT in the last 72 hours. Coagulation: No results for input(s): LABPROT, INR in the last 72 hours. Urine Drug Screen: Drugs of Abuse     Component Value Date/Time   LABOPIA NONE DETECTED 05/20/2018 0235   COCAINSCRNUR NONE DETECTED 05/20/2018 0235   LABBENZ POSITIVE (A) 05/20/2018 0235   AMPHETMU NONE DETECTED 05/20/2018 0235   THCU POSITIVE (A) 05/20/2018 0235   LABBARB NONE DETECTED 05/20/2018 0235    Alcohol Level: No results for  input(s): ETH in the last 72 hours. Urinalysis: No results for input(s): COLORURINE, LABSPEC, PHURINE, GLUCOSEU, HGBUR, BILIRUBINUR, KETONESUR, PROTEINUR, UROBILINOGEN, NITRITE, LEUKOCYTESUR in the last 72 hours.  Invalid input(s): APPERANCEUR Misc. Labs:   ABGS: No results for input(s): PHART, PO2ART, TCO2, HCO3 in the last 72 hours.  Invalid input(s): PCO2   MICROBIOLOGY: Recent Results (from the past 240  hour(s))  SARS Coronavirus 2 [facility] order, Performed in [facility] hospital lab)     Status: None   Collection Time: 07/27/18 11:36 AM  Result Value Ref Range Status   SARS Coronavirus 2 NEGATIVE NEGATIVE Final    Comment: (NOTE) If result is NEGATIVE SARS-CoV-2 target nucleic acids are NOT DETECTED. The SARS-CoV-2 RNA is generally detectable in upper and lower  respiratory specimens during the acute phase of infection. The lowest  concentration of SARS-CoV-2 viral copies this assay can detect is 250  copies / mL. A negative result does not preclude SARS-CoV-2 infection  and should not be used as the sole basis for treatment or other  patient management decisions.  A negative result may occur with  improper specimen collection / handling, submission of specimen other  than nasopharyngeal swab, presence of viral mutation(s) within the  areas targeted by this assay, and inadequate number of viral copies  (<250 copies / mL). A negative result must be combined with clinical  observations, patient history, and epidemiological information. If result is POSITIVE SARS-CoV-2 target nucleic acids are DETECTED. The SARS-CoV-2 RNA is generally detectable in upper and lower  respiratory specimens dur ing the acute phase of infection.  Positive  results are indicative of active infection with SARS-CoV-2.  Clinical  correlation with patient history and other diagnostic information is  necessary to determine patient infection status.  Positive results do  not rule out bacterial  infection or co-infection with other viruses. If result is PRESUMPTIVE POSTIVE SARS-CoV-2 nucleic acids MAY BE PRESENT.   A presumptive positive result was obtained on the submitted specimen  and confirmed on repeat testing.  While 2019 novel coronavirus  (SARS-CoV-2) nucleic acids may be present in the submitted sample  additional confirmatory testing may be necessary for epidemiological  and / or clinical management purposes  to differentiate between  SARS-CoV-2 and other Sarbecovirus currently known to infect humans.  If clinically indicated additional testing with an alternate test  methodology [phone]) is advised. The SARS-CoV-2 RNA is generally  detectable in upper and lower respiratory sp ecimens during the acute  phase of infection. The expected result is Negative. Fact Sheet for Patients:  StrictlyIdeas.no Fact Sheet for Healthcare Providers: BankingDealers.co.za This test is not yet approved or cleared by the [place] FDA and has been authorized for detection and/or diagnosis of SARS-CoV-2 by FDA under an Emergency Use Authorization (EUA).  This EUA will remain in effect (meaning this test can be used) for the duration of the COVID-19 declaration under Section 564(b)(1) of the Act, 21 U.S.C. section 360bbb-3(b)(1), unless the authorization is terminated or revoked sooner. Performed at [facility], [address], [place], [place] [address]   Blood culture (routine x 2)     Status: None (Preliminary result)   Collection Time: 07/27/18 12:09 PM  Result Value Ref Range Status   Specimen Description   Final    BLOOD RIGHT ARM BOTTLES DRAWN AEROBIC AND ANAEROBIC   Special Requests   Final    Blood Culture results may not be optimal due to an excessive volume of blood received in culture bottles   Culture   Final    NO GROWTH < 24 HOURS Performed at [facility], [address], [place], [place] [address]    Report Status  PENDING  Incomplete  Blood culture (routine x 2)     Status: None (Preliminary result)   Collection Time: 07/27/18 12:18 PM  Result Value Ref Range Status   Specimen  Description BLOOD LEFT ARM BOTTLES DRAWN AEROBIC AND ANAEROBIC  Final   Special Requests Blood Culture adequate volume  Final   Culture   Final    NO GROWTH < 24 HOURS Performed at [facility], [address] [place], [place] [address]    Report Status PENDING  Incomplete  Respiratory Panel by PCR     Status: None   Collection Time: 07/27/18  3:14 PM  Result Value Ref Range Status   Adenovirus NOT DETECTED NOT DETECTED Final   Coronavirus 229E NOT DETECTED NOT DETECTED Final    Comment: (NOTE) The Coronavirus on the Respiratory Panel, DOES NOT test for the novel  Coronavirus (2019 nCoV)    Coronavirus HKU1 NOT DETECTED NOT DETECTED Final   Coronavirus NL63 NOT DETECTED NOT DETECTED Final   Coronavirus OC43 NOT DETECTED NOT DETECTED Final   Metapneumovirus NOT DETECTED NOT DETECTED Final   Rhinovirus / Enterovirus NOT DETECTED NOT DETECTED Final   Influenza A NOT DETECTED NOT DETECTED Final   Influenza B NOT DETECTED NOT DETECTED Final   Parainfluenza Virus 1 NOT DETECTED NOT DETECTED Final   Parainfluenza Virus 2 NOT DETECTED NOT DETECTED Final   Parainfluenza Virus 3 NOT DETECTED NOT DETECTED Final   Parainfluenza Virus 4 NOT DETECTED NOT DETECTED Final   Respiratory Syncytial Virus NOT DETECTED NOT DETECTED Final   Bordetella pertussis NOT DETECTED NOT DETECTED Final   Chlamydophila pneumoniae NOT DETECTED NOT DETECTED Final   Mycoplasma pneumoniae NOT DETECTED NOT DETECTED Final    Comment: Performed at [facility], [address] [place], [place] [address]  MRSA PCR Screening     Status: None   Collection Time: 07/27/18  3:14 PM  Result Value Ref Range Status   MRSA by PCR NEGATIVE NEGATIVE Final    Comment:        The GeneXpert MRSA Assay (FDA approved for NASAL specimens only), is one component  of a comprehensive MRSA colonization surveillance program. It is not intended to diagnose MRSA infection nor to guide or monitor treatment for MRSA infections. Performed at [facility], [address] [place],  [address]     Studies/Results: Ct Angio Chest Pe W And/or Wo Contrast  Result Date: 07/27/2018 CLINICAL DATA:  Shortness of breath and pain. Decreased oxygen saturation EXAM: CT ANGIOGRAPHY CHEST WITH CONTRAST TECHNIQUE: Multidetector CT imaging of the chest was performed using the standard protocol during bolus administration of intravenous contrast. Multiplanar CT image reconstructions and MIPs were obtained to evaluate the vascular anatomy. CONTRAST:  122mL OMNIPAQUE IOHEXOL 350 MG/ML SOLN COMPARISON:  Chest CT May 23, 2018; chest radiograph July 27, 2018 FINDINGS: Cardiovascular: There is no demonstrable pulmonary embolus. There is no thoracic aortic aneurysm or dissection. The visualized great vessels appear unremarkable except for slight calcification at the origins of the respective great vessels. There is mild aortic atherosclerosis. There is no pericardial effusion or pericardial thickening. Mediastinum/Nodes: Thyroid appears unremarkable. There are multiple subcentimeter mediastinal lymph nodes. There is a precarinal lymph node measuring 1.1 x 1.1 cm. There is an aortopulmonary window lymph node measuring 1.4 x 1.0 cm. There is a subcarinal lymph node measuring 1.0 x 1.0 cm. There is a right hilar lymph node measuring 1.1 x 1.0 cm. There is thickening throughout the wall of the mid and distal thirds of the esophagus. Lungs/Pleura: There is widespread fibrosis throughout the lungs diffusely with scattered small bullae. There is no frank consolidation. There is no appreciable pleural effusion or pleural thickening. Upper  Abdomen: There are surgical clips in the region of the porta. Visualized upper abdominal structures otherwise appear unremarkable. Musculoskeletal: There  are no blastic or lytic bone lesions. There are no appreciable chest wall lesions. Review of the MIP images confirms the above findings. IMPRESSION: 1. No demonstrable pulmonary embolus. No thoracic aortic aneurysm or dissection. There is mild aortic atherosclerosis. 2. Extensive fibrosis throughout the lungs diffusely. No frank edema or consolidation. 3. Several prominent lymph nodes which may well be of reactive etiology given the extensive parenchymal lung changes. 4. Thickening of the esophageal wall throughout the mid and distal thirds. Suspect a degree of esophagitis. Aortic Atherosclerosis (ICD10-I70.0). Electronically Signed   By: Lowella Grip III M.D.   On: 07/27/2018 14:45   Dg Chest Portable 1 View  Result Date: 07/27/2018 CLINICAL DATA:  Shortness of breath EXAM: PORTABLE CHEST 1 VIEW COMPARISON:  06/06/2018 FINDINGS: Cardiac shadow is stable. Diffuse fibrotic changes are noted throughout both lungs somewhat increased when compared with the prior exam. This may represent some mild acute on chronic infiltrate particularly in the right lung. No sizable effusion is seen. No bony abnormality is noted. IMPRESSION: Increased density in the right lung representing some acute on chronic infiltrate. Electronically Signed   By: Inez Catalina M.D.   On: 07/27/2018 11:36    Medications:  Prior to Admission:  Medications Prior to Admission  Medication Sig Dispense Refill Last Dose  . acetaminophen (TYLENOL) 500 MG tablet Take 1 tablet (500 mg total) by mouth every 8 (eight) hours as needed for moderate pain. 30 tablet 0 Past Week at Unknown time  . apixaban (ELIQUIS) 5 MG TABS tablet Take 1 tablet (5 mg total) by mouth 2 (two) times daily. 2 pills mouth twice a day till 06/12/2018 then switch to 1 pill twice a day from 06/13/2018 (Patient taking differently: Take 10 mg by mouth 2 (two) times daily. ) 60 tablet 0 07/26/2018 at 0500pm  . cetirizine (ZYRTEC) 10 MG tablet Take 10 mg by mouth daily.    07/27/2018 at Unknown time  . FLUoxetine (PROZAC) 40 MG capsule Take 1 capsule (40 mg total) by mouth daily. For depression and anxiety. 30 capsule 0 07/27/2018 at Unknown time  . guaiFENesin (MUCINEX) 600 MG 12 hr tablet Take 800 mg by mouth 2 (two) times daily.    07/26/2018 at Unknown time  . pantoprazole (PROTONIX) 40 MG tablet Take 1 tablet (40 mg total) by mouth 2 (two) times daily. 60 tablet 0 07/27/2018 at Unknown time  . Probiotic Product (PROBIOTIC PO) Take 1 tablet by mouth daily. Women's probiotic for GI health   07/26/2018 at Unknown time  . divalproex (DEPAKOTE ER) 500 MG 24 hr tablet Take 500 mg by mouth at bedtime. For moods   Not Taking at Unknown time  . lactose free nutrition (BOOST PLUS) LIQD Take 237 mLs by mouth 3 (three) times daily with meals. (Patient not taking: Reported on 07/27/2018) 30 Can 0 Not Taking at Unknown time  . Melatonin 5 MG CAPS Take 20 mg by mouth at bedtime.    Not Taking at Unknown time  . metoprolol tartrate (LOPRESSOR) 25 MG tablet Take 1 tablet (25 mg total) by mouth 2 (two) times daily. (Patient not taking: Reported on 07/27/2018) 60 tablet 0 Not Taking at Unknown time  . ziprasidone (GEODON) 80 MG capsule Take one capsule at bedtime for depression and mood stability. (Patient not taking: Reported on 07/27/2018) 30 capsule 0 Not Taking at Unknown time  Scheduled: . apixaban  5 mg Oral BID  . benzonatate  100 mg Oral TID  . budesonide (PULMICORT) nebulizer solution  0.25 mg Nebulization BID  . divalproex  500 mg Oral QHS  . FLUoxetine  40 mg Oral Daily  . guaiFENesin  600 mg Oral BID  . ipratropium  0.5 mg Nebulization Q6H WA  . levalbuterol  1.25 mg Nebulization Q6H WA  . loratadine  10 mg Oral Daily  . methylPREDNISolone (SOLU-MEDROL) injection  40 mg Intravenous Q12H  . metoprolol tartrate  5 mg Intravenous Once  . pantoprazole  40 mg Oral BID  . potassium chloride SA  40 mEq Oral TID  . sodium chloride flush  3 mL Intravenous Q12H    Continuous: . sodium chloride    . piperacillin-tazobactam (ZOSYN)  IV 3.375 g (07/28/18 0604)   BJS:EGBTDV chloride, acetaminophen, fentaNYL (SUBLIMAZE) injection, guaiFENesin-dextromethorphan, ondansetron **OR** ondansetron (ZOFRAN) IV, sodium chloride flush  Assesment: She was admitted with acute hypoxic respiratory failure.  She has diffuse interstitial lung disease seen on CT.  There was some interstitial lung disease on previous CT done in February but this looks substantially different.  Differential diagnosis is wide and includes chronic organizing pneumonia previously known as [place].  She also could have acute interstitial pneumonia and I would add Levaquin to current treatment.  Agree with the use of steroids. Active Problems:   Acute hypoxemic respiratory failure ([place])    Plan: We will check sed rate which I expect will be high but if it is up in the 80-100 range that would argue in favor of some sort of an acute/subacute interstitial process.  Continue other treatments.    LOS: 1 day   Alonza Bogus 07/28/2018, 9:53 AM

## 2018-07-29 LAB — BASIC METABOLIC PANEL
Anion gap: 8 (ref 5–15)
BUN: 6 mg/dL (ref 6–20)
CO2: 27 mmol/L (ref 22–32)
Calcium: 9.4 mg/dL (ref 8.9–10.3)
Chloride: 101 mmol/L (ref 98–111)
Creatinine, Ser: 0.51 mg/dL (ref 0.44–1.00)
GFR calc Af Amer: >60 mL/min (ref >60)
GFR calc non Af Amer: >60 mL/min (ref >60)
Glucose, Bld: 140 mg/dL — ABNORMAL HIGH (ref 70–99)
Potassium: 5.1 mmol/L (ref 3.5–5.1)
Sodium: 136 mmol/L (ref 135–145)

## 2018-07-29 LAB — CBC
HCT: 36.4 % (ref 36.0–46.0)
Hemoglobin: 11.4 g/dL — ABNORMAL LOW (ref 12.0–15.0)
MCH: 30.9 pg (ref 26.0–34.0)
MCHC: 31.3 g/dL (ref 30.0–36.0)
MCV: 98.6 fL (ref 80.0–100.0)
Platelets: 529 10*3/uL — ABNORMAL HIGH (ref 150–400)
RBC: 3.69 MIL/uL — ABNORMAL LOW (ref 3.87–5.11)
RDW: 16.5 % — ABNORMAL HIGH (ref 11.5–15.5)
WBC: 6.9 10*3/uL (ref 4.0–10.5)
nRBC: 0 % (ref 0.0–0.2)

## 2018-07-29 MED ORDER — LIDOCAINE 5 % EX PTCH
1.0000 | MEDICATED_PATCH | CUTANEOUS | Status: DC
  Administered 2018-07-29 – 2018-08-01 (×4): 1 via TRANSDERMAL
  Filled 2018-07-29 (×4): qty 1

## 2018-07-29 MED ORDER — FENTANYL CITRATE (PF) 100 MCG/2ML IJ SOLN: 50.0000 ug | Freq: Once | INTRAMUSCULAR | Status: DC

## 2018-07-29 MED ORDER — HYDROCODONE-ACETAMINOPHEN 5-325 MG PO TABS
1.0000 | ORAL_TABLET | Freq: Four times a day (QID) | ORAL | Status: DC
  Administered 2018-07-29 – 2018-08-02 (×15): 2 via ORAL
  Filled 2018-07-29 (×15): qty 2

## 2018-07-29 NOTE — Progress Notes (Signed)
PROGRESS NOTE    Christine Ortega  NKN:[id number] DOB: [birth year] DOA: 07/27/2018 PCP: Lucia Gaskins, MD   Brief Narrative:  Per HPI: Christine L Robertsis a 58 y.o.femalewith medical history significant foranxiety/depression, large-gastric ulcer, prior DVT on Eliquis, bipolar disorder, schizophrenia, and chronic pain who presented to the emergency department with worsening dyspnea and hypoxemia checked by home health nurse with saturations in the 70th percentile. She has had increased work of breathing recently and persistent dry cough with no hemoptysis or chest pain. She denies any sick contacts and has been at home since her last discharge with no recent travel. She denies any fevers or chills and has no significant lower extremity edema.  She was admitted with acute hypoxemic respiratory failure secondary to right-sided pneumonia, but has CT chest findings of interstitial fibrosis and possibly interstitial pneumonia.  Appreciate pulmonology evaluation this a.m. with addition of Levaquin and ESR pending.  IV Solu-Medrol also added.  She continues to remain severely symptomatic, but is slowly improving.  Assessment & Plan:   Active Problems:   Acute hypoxemic respiratory failure ([place])  1. Acute hypoxemic respiratory failure secondary to right-sided pneumonia and possible interstitial process with significant fibrosis noted on CT chest.     It appears that patient may have interstitial pneumonia versus COP. Discontinued vancomycin due to MRSA nares negative.  Continue on Zosyn as well as Levaquin added by pulmonology-sed rate low, but still improving on IV steroids.  Respiratory panel noted to be negative.  COVID-19 testing noted to be negative as well.  Continue IV steroids today and continue with duo nebs as well as Pulmicort.  Pain management for chest and back given recurrent cough. 2. Hypokalemia-resolved. Recheck in am.  Hold further supplementation. 3. Prior right lower extremity  DVT and left upper extremity DVT. Continue on home Eliquis. 4. ADD/bipolar/depression/schizophrenia. Continue home medications. 5. Dysphagia. Continue on dysphagia 1 diet and reassess while here. Will order SLP evaluation. 6. Chronic pain.    Continue on around-the-clock narcotic pain medication for assistance in pain management with IV narcotics for breakthrough.   DVT prophylaxis: Eliquis Code Status: Full Family Communication: None at bedside Disposition Plan: Appreciate pulmonology recommendations.  Continue on current IV antibiotics and monitor for symptomatic improvement.   Consultants:   Pulmonology  Procedures:   None  Antimicrobials:   Zosyn 4/24->  Vancomycin 4/24-4/25  Levaquin 4/25->  Subjective: Patient seen and evaluated today with no new acute complaints or concerns. No acute concerns or events noted overnight.  She seems to think that her cough is slowly improving with the use of steroids.  She states that her pain is better.  Objective: Vitals:   07/28/18 2058 07/28/18 2320 07/29/18 0506 07/29/18 0730  BP: 108/72 129/85 119/76   Pulse: (!) 109 (!) 108 (!) 117   Resp: '15 16 16   ' Temp: 98 F (36.7 C)  97.9 F (36.6 C)   TempSrc: Oral  Oral   SpO2: 100% 100% 97% 99%  Weight:      Height:        Intake/Output Summary (Last 24 hours) at 07/29/2018 1059 Last data filed at 07/29/2018 0900 Gross per 24 hour  Intake 2170 ml  Output 4600 ml  Net -2430 ml   Filed Weights   07/27/18 1102 07/27/18 1627  Weight: 54.4 kg 54.5 kg    Examination:  General exam: Appears calm and comfortable  Respiratory system: Clear to auscultation. Respiratory effort normal.  Currently on 3 L nasal cannula. Cardiovascular system:  S1 & S2 heard, RRR. No JVD, murmurs, rubs, gallops or clicks. No pedal edema. Gastrointestinal system: Abdomen is nondistended, soft and nontender. No organomegaly or masses felt. Normal bowel sounds heard. Central nervous system: Alert  and oriented. No focal neurological deficits. Extremities: Symmetric 5 x 5 power. Skin: No rashes, lesions or ulcers Psychiatry: Judgement and insight appear normal. Mood & affect appropriate.     Data Reviewed: I have personally reviewed following labs and imaging studies  CBC: Recent Labs  Lab 07/27/18 1206 07/28/18 0620 07/29/18 0627  WBC 13.8* 12.6* 6.9  NEUTROABS 7.7  --   --   HGB 11.8* 11.0* 11.4*  HCT 37.0 35.2* 36.4  MCV 96.4 98.1 98.6  PLT 594* 532* 889*   Basic Metabolic Panel: Recent Labs  Lab 07/27/18 1206 07/27/18 1400 07/28/18 0620 07/29/18 0627  NA 140  --  140 136  K 2.9*  --  3.8 5.1  CL 105  --  108 101  CO2 26  --  25 27  GLUCOSE 114*  --  89 140*  BUN 16  --  6 6  CREATININE 0.57  --  0.45 0.51  CALCIUM 8.8*  --  8.6* 9.4  MG  --  1.9 1.7  --    GFR: Estimated Creatinine Clearance: 64.2 mL/min (by C-G formula based on SCr of 0.51 mg/dL). Liver Function Tests: Recent Labs  Lab 07/27/18 1206  AST 22  ALT 13  ALKPHOS 109  BILITOT 0.2*  PROT 6.2*  ALBUMIN 2.7*   No results for input(s): LIPASE, AMYLASE in the last 168 hours. No results for input(s): AMMONIA in the last 168 hours. Coagulation Profile: No results for input(s): INR, PROTIME in the last 168 hours. Cardiac Enzymes: Recent Labs  Lab 07/27/18 1206  TROPONINI <0.03   BNP (last 3 results) No results for input(s): PROBNP in the last 8760 hours. HbA1C: No results for input(s): HGBA1C in the last 72 hours. CBG: No results for input(s): GLUCAP in the last 168 hours. Lipid Profile: No results for input(s): CHOL, HDL, LDLCALC, TRIG, CHOLHDL, LDLDIRECT in the last 72 hours. Thyroid Function Tests: No results for input(s): TSH, T4TOTAL, FREET4, T3FREE, THYROIDAB in the last 72 hours. Anemia Panel: No results for input(s): VITAMINB12, FOLATE, FERRITIN, TIBC, IRON, RETICCTPCT in the last 72 hours. Sepsis Labs: Recent Labs  Lab 07/27/18 1210 07/27/18 1400  PROCALCITON  --   5.87  LATICACIDVEN 1.1 0.8    Recent Results (from the past 240 hour(s))  SARS Coronavirus 2 [facility] order, Performed in [place] hospital lab)     Status: None   Collection Time: 07/27/18 11:36 AM  Result Value Ref Range Status   SARS Coronavirus 2 NEGATIVE NEGATIVE Final    Comment: (NOTE) If result is NEGATIVE SARS-CoV-2 target nucleic acids are NOT DETECTED. The SARS-CoV-2 RNA is generally detectable in upper and lower  respiratory specimens during the acute phase of infection. The lowest  concentration of SARS-CoV-2 viral copies this assay can detect is 250  copies / mL. A negative result does not preclude SARS-CoV-2 infection  and should not be used as the sole basis for treatment or other  patient management decisions.  A negative result may occur with  improper specimen collection / handling, submission of specimen other  than nasopharyngeal swab, presence of viral mutation(s) within the  areas targeted by this assay, and inadequate number of viral copies  (<250 copies / mL). A negative result must be combined with clinical  observations,  patient history, and epidemiological information. If result is POSITIVE SARS-CoV-2 target nucleic acids are DETECTED. The SARS-CoV-2 RNA is generally detectable in upper and lower  respiratory specimens dur ing the acute phase of infection.  Positive  results are indicative of active infection with SARS-CoV-2.  Clinical  correlation with patient history and other diagnostic information is  necessary to determine patient infection status.  Positive results do  not rule out bacterial infection or co-infection with other viruses. If result is PRESUMPTIVE POSTIVE SARS-CoV-2 nucleic acids MAY BE PRESENT.   A presumptive positive result was obtained on the submitted specimen  and confirmed on repeat testing.  While 2019 novel coronavirus  (SARS-CoV-2) nucleic acids may be present in the submitted sample  additional confirmatory testing  may be necessary for epidemiological  and / or clinical management purposes  to differentiate between  SARS-CoV-2 and other Sarbecovirus currently known to infect humans.  If clinically indicated additional testing with an alternate test  methodology [phone]) is advised. The SARS-CoV-2 RNA is generally  detectable in upper and lower respiratory sp ecimens during the acute  phase of infection. The expected result is Negative. Fact Sheet for Patients:  StrictlyIdeas.no Fact Sheet for Healthcare Providers: BankingDealers.co.za This test is not yet approved or cleared by the [place] FDA and has been authorized for detection and/or diagnosis of SARS-CoV-2 by FDA under an Emergency Use Authorization (EUA).  This EUA will remain in effect (meaning this test can be used) for the duration of the COVID-19 declaration under Section 564(b)(1) of the Act, 21 U.S.C. section 360bbb-3(b)(1), unless the authorization is terminated or revoked sooner. Performed at [facility], [address], [place], [place] [address]   Blood culture (routine x 2)     Status: None (Preliminary result)   Collection Time: 07/27/18 12:09 PM  Result Value Ref Range Status   Specimen Description   Final    BLOOD RIGHT ARM BOTTLES DRAWN AEROBIC AND ANAEROBIC   Special Requests   Final    Blood Culture results may not be optimal due to an excessive volume of blood received in culture bottles   Culture   Final    NO GROWTH 2 DAYS Performed at [facility], [address] [place], [place] [address]    Report Status PENDING  Incomplete  Blood culture (routine x 2)     Status: None (Preliminary result)   Collection Time: 07/27/18 12:18 PM  Result Value Ref Range Status   Specimen Description BLOOD LEFT ARM BOTTLES DRAWN AEROBIC AND ANAEROBIC  Final   Special Requests Blood Culture adequate volume  Final   Culture   Final    NO GROWTH 2 DAYS Performed at [facility], [address], [place], [place] [address]    Report Status PENDING  Incomplete  Respiratory Panel by PCR     Status: None   Collection Time: 07/27/18  3:14 PM  Result Value Ref Range Status   Adenovirus NOT DETECTED NOT DETECTED Final   Coronavirus 229E NOT DETECTED NOT DETECTED Final    Comment: (NOTE) The Coronavirus on the Respiratory Panel, DOES NOT test for the novel  Coronavirus (2019 nCoV)    Coronavirus HKU1 NOT DETECTED NOT DETECTED Final   Coronavirus NL63 NOT DETECTED NOT DETECTED Final   Coronavirus OC43 NOT DETECTED NOT DETECTED Final   Metapneumovirus NOT DETECTED NOT DETECTED Final   Rhinovirus / Enterovirus NOT DETECTED NOT DETECTED Final   Influenza A NOT DETECTED NOT DETECTED Final   Influenza  B NOT DETECTED NOT DETECTED Final   Parainfluenza Virus 1 NOT DETECTED NOT DETECTED Final   Parainfluenza Virus 2 NOT DETECTED NOT DETECTED Final   Parainfluenza Virus 3 NOT DETECTED NOT DETECTED Final   Parainfluenza Virus 4 NOT DETECTED NOT DETECTED Final   Respiratory Syncytial Virus NOT DETECTED NOT DETECTED Final   Bordetella pertussis NOT DETECTED NOT DETECTED Final   Chlamydophila pneumoniae NOT DETECTED NOT DETECTED Final   Mycoplasma pneumoniae NOT DETECTED NOT DETECTED Final    Comment: Performed at [facility], [place] [address] [place], [place] [address]  MRSA PCR Screening     Status: None   Collection Time: 07/27/18  3:14 PM  Result Value Ref Range Status   MRSA by PCR NEGATIVE NEGATIVE Final    Comment:        The GeneXpert MRSA Assay (FDA approved for NASAL specimens only), is one component of a comprehensive MRSA colonization surveillance program. It is not intended to diagnose MRSA infection nor to guide or monitor treatment for MRSA infections. Performed at [facility], [address] [place], [place] [address]          Radiology Studies: Ct Angio Chest Pe W And/or Wo Contrast  Result Date: 07/27/2018 CLINICAL DATA:  Shortness of  breath and pain. Decreased oxygen saturation EXAM: CT ANGIOGRAPHY CHEST WITH CONTRAST TECHNIQUE: Multidetector CT imaging of the chest was performed using the standard protocol during bolus administration of intravenous contrast. Multiplanar CT image reconstructions and MIPs were obtained to evaluate the vascular anatomy. CONTRAST:  150m OMNIPAQUE IOHEXOL 350 MG/ML SOLN COMPARISON:  Chest CT May 23, 2018; chest radiograph July 27, 2018 FINDINGS: Cardiovascular: There is no demonstrable pulmonary embolus. There is no thoracic aortic aneurysm or dissection. The visualized great vessels appear unremarkable except for slight calcification at the origins of the respective great vessels. There is mild aortic atherosclerosis. There is no pericardial effusion or pericardial thickening. Mediastinum/Nodes: Thyroid appears unremarkable. There are multiple subcentimeter mediastinal lymph nodes. There is a precarinal lymph node measuring 1.1 x 1.1 cm. There is an aortopulmonary window lymph node measuring 1.4 x 1.0 cm. There is a subcarinal lymph node measuring 1.0 x 1.0 cm. There is a right hilar lymph node measuring 1.1 x 1.0 cm. There is thickening throughout the wall of the mid and distal thirds of the esophagus. Lungs/Pleura: There is widespread fibrosis throughout the lungs diffusely with scattered small bullae. There is no frank consolidation. There is no appreciable pleural effusion or pleural thickening. Upper Abdomen: There are surgical clips in the region of the porta. Visualized upper abdominal structures otherwise appear unremarkable. Musculoskeletal: There are no blastic or lytic bone lesions. There are no appreciable chest wall lesions. Review of the MIP images confirms the above findings. IMPRESSION: 1. No demonstrable pulmonary embolus. No thoracic aortic aneurysm or dissection. There is mild aortic atherosclerosis. 2. Extensive fibrosis throughout the lungs diffusely. No frank edema or consolidation. 3.  Several prominent lymph nodes which may well be of reactive etiology given the extensive parenchymal lung changes. 4. Thickening of the esophageal wall throughout the mid and distal thirds. Suspect a degree of esophagitis. Aortic Atherosclerosis (ICD10-I70.0). Electronically Signed   By: WLowella GripIII M.D.   On: 07/27/2018 14:45   Dg Chest Portable 1 View  Result Date: 07/27/2018 CLINICAL DATA:  Shortness of breath EXAM: PORTABLE CHEST 1 VIEW COMPARISON:  06/06/2018 FINDINGS: Cardiac shadow is stable. Diffuse fibrotic changes are noted throughout both lungs somewhat increased when compared with  the prior exam. This may represent some mild acute on chronic infiltrate particularly in the right lung. No sizable effusion is seen. No bony abnormality is noted. IMPRESSION: Increased density in the right lung representing some acute on chronic infiltrate. Electronically Signed   By: Inez Catalina M.D.   On: 07/27/2018 11:36        Scheduled Meds:  apixaban  5 mg Oral BID   benzonatate  200 mg Oral TID   budesonide (PULMICORT) nebulizer solution  0.25 mg Nebulization BID   divalproex  500 mg Oral QHS   FLUoxetine  40 mg Oral Daily   guaiFENesin  600 mg Oral BID   HYDROcodone-acetaminophen  1 tablet Oral Q6H   ipratropium  0.5 mg Nebulization Q6H WA   levalbuterol  1.25 mg Nebulization Q6H WA   loratadine  10 mg Oral Daily   methylPREDNISolone (SOLU-MEDROL) injection  40 mg Intravenous Q12H   metoprolol tartrate  5 mg Intravenous Once   pantoprazole  40 mg Oral BID   sodium chloride flush  3 mL Intravenous Q12H   Continuous Infusions:  sodium chloride     levofloxacin (LEVAQUIN) IV 500 mg (07/29/18 1020)   piperacillin-tazobactam (ZOSYN)  IV 3.375 g (07/29/18 0535)     LOS: 2 days    Time spent: 30 minutes    Rashida Ladouceur Darleen Crocker, DO [facility] Pager [phone]  If 7PM-7AM, please contact night-coverage [email or web address] Password [facility] 07/29/2018, 10:59 AM

## 2018-07-29 NOTE — Evaluation (Signed)
Clinical/Bedside Swallow Evaluation Patient Details  Name: Christine Ortega MRN: [id number] Date of Birth: 07/25/1960  Today's Date: 07/29/2018 Time: SLP Start Time (ACUTE ONLY): 1033 SLP Stop Time (ACUTE ONLY): 1055 SLP Time Calculation (min) (ACUTE ONLY): 22 min  Past Medical History:  Past Medical History:  Diagnosis Date  . ADD (attention deficit disorder)   . Anxiety   . Bipolar disorder ([place])   . Chronic pain   . Depression   . Narcotic abuse in remission ([place])   . Schizophrenia ([place])   . Seizures ([place])    Past Surgical History:  Past Surgical History:  Procedure Laterality Date  . BACK SURGERY    . ESOPHAGOGASTRODUODENOSCOPY N/A 05/19/2018   Procedure: ESOPHAGOGASTRODUODENOSCOPY (EGD);  Surgeon: Yetta Flock, MD;  Location: [facility] ENDOSCOPY;  Service: Gastroenterology;  Laterality: N/A;  egd at bedside in ICU, intubated  . IR ANGIOGRAM SELECTIVE EACH ADDITIONAL VESSEL  05/20/2018  . IR ANGIOGRAM SELECTIVE EACH ADDITIONAL VESSEL  05/20/2018  . IR ANGIOGRAM VISCERAL SELECTIVE  05/20/2018  . IR ANGIOGRAM VISCERAL SELECTIVE  05/20/2018  . IR EMBO ART  VEN HEMORR LYMPH EXTRAV  INC GUIDE ROADMAPPING  05/20/2018  . IR US GUIDE VASC ACCESS LEFT  05/20/2018   HPI:  Christine L Robertsis a 58 y.o.femalewith medical history significant foranxiety/depression, large-gastric ulcer, prior DVT on Eliquis, bipolar disorder, schizophrenia, and chronic pain who presented to the emergency department with worsening dyspnea and hypoxemia checked by home health nurse with saturations in the 70th percentile. She has had increased work of breathing recently and persistent dry cough with no hemoptysis or chest pain. She denies any sick contacts and has been at home since her last discharge with no recent travel. She denies any fevers or chills and has no significant lower extremity edema. She was admitted with acute hypoxemic respiratory failure secondary to right-sided pneumonia, but has CT chest  findings of interstitial fibrosis and possibly interstitial pneumonia. Appreciate pulmonology evaluation this a.m. with addition of Levaquin and ESR pending. IV Solu-Medrol also added. She continues to remain severely symptomatic, but is slowly improving. It appears that patient may have interstitial pneumonia versus COP. Discontinued vancomycin due to MRSA nares negative.  Continue on Zosyn as well as Levaquin added by pulmonology-sed rate low, but still improving on IV steroids.  Respiratory panel noted to be negative.  COVID-19 testing noted to be negative as well.  Continue IV steroids today and continue with duo nebs as well as Pulmicort.  Pain management for chest and back given recurrent cough. BSE requested. Pt was followed by SLP in March at [facility] following intubation and had MBSS 06/07/2018 and cleared for mechanical soft diet with thin liquids.    Assessment / Plan / Recommendation Clinical Impression  Clinical swallow evaluation completed at bedside. Previous records from SLP at [facility] reviewed as Pt with reversible dysphagia following extensive intubation in Feb/March 2020. She had a repeat MBSS on June 07, 2018 with recommendation for mechanical soft textures and thin liquids (trace penetration of thins during the swallow without aspiration). Pt was coughing extensively upon SLP arrival and reports that she coughs constantly (dry cough and breathy vocal quality). Oral motor examination is otherwise unremarkable and Pt shows no overt signs or symptoms of aspiration at bedside during self presentation of regular textures and thin liquids via cup/straw. SLP reviewed aspiration precautions as it relates to coordination of respiration and swallow (pt with decreased respiratory status). Can complete another MBSS if MD desires, however do not  suspect significant aspiration risk at this time given previous objective assessment in March. SLP will sign off. Reconsult as needed. Pt reports that she  lives at home with her daughter who is a Marine scientist at [employer] [place] to RN.  SLP Visit Diagnosis: Dysphagia, unspecified (R13.10)    Aspiration Risk  Mild aspiration risk    Diet Recommendation Dysphagia 3 (Mech soft);Thin liquid   Liquid Administration via: Cup;Straw Medication Administration: Whole meds with liquid Supervision: Patient able to self feed Compensations: Slow rate;Small sips/bites Postural Changes: Seated upright at 90 degrees;Remain upright for at least 30 minutes after po intake    Other  Recommendations Oral Care Recommendations: Oral care BID;Patient independent with oral care Other Recommendations: Clarify dietary restrictions   Follow up Recommendations None      Frequency and Duration            Prognosis Prognosis for Safe Diet Advancement: Good      Swallow Study   General Date of Onset: 07/27/18 HPI: Christine L Robertsis a 58 y.o.femalewith medical history significant foranxiety/depression, large-gastric ulcer, prior DVT on Eliquis, bipolar disorder, schizophrenia, and chronic pain who presented to the emergency department with worsening dyspnea and hypoxemia checked by home health nurse with saturations in the 70th percentile. She has had increased work of breathing recently and persistent dry cough with no hemoptysis or chest pain. She denies any sick contacts and has been at home since her last discharge with no recent travel. She denies any fevers or chills and has no significant lower extremity edema. She was admitted with acute hypoxemic respiratory failure secondary to right-sided pneumonia, but has CT chest findings of interstitial fibrosis and possibly interstitial pneumonia. Appreciate pulmonology evaluation this a.m. with addition of Levaquin and ESR pending. IV Solu-Medrol also added. She continues to remain severely symptomatic, but is slowly improving. It appears that patient may have interstitial pneumonia versus COP. Discontinued  vancomycin due to MRSA nares negative.  Continue on Zosyn as well as Levaquin added by pulmonology-sed rate low, but still improving on IV steroids.  Respiratory panel noted to be negative.  COVID-19 testing noted to be negative as well.  Continue IV steroids today and continue with duo nebs as well as Pulmicort.  Pain management for chest and back given recurrent cough. BSE requested. Pt was followed by SLP in March at [facility] following intubation and had MBSS 06/07/2018 and cleared for mechanical soft diet with thin liquids.  Type of Study: Bedside Swallow Evaluation Previous Swallow Assessment: MBSS 06/07/2018 D3/thin Diet Prior to this Study: Dysphagia 1 (puree);Thin liquids Temperature Spikes Noted: No Respiratory Status: Nasal cannula History of Recent Intubation: No Behavior/Cognition: Cooperative;Alert;Pleasant mood Oral Cavity Assessment: Within Functional Limits Oral Care Completed by SLP: No Oral Cavity - Dentition: Adequate natural dentition Vision: Functional for self-feeding Self-Feeding Abilities: Able to feed self Patient Positioning: Upright in bed Baseline Vocal Quality: Breathy Volitional Cough: Strong Volitional Swallow: Able to elicit    Oral/Motor/Sensory Function Overall Oral Motor/Sensory Function: Within functional limits   Ice Chips Ice chips: Within functional limits Presentation: Spoon   Thin Liquid Thin Liquid: Within functional limits Presentation: Cup;Self Fed;Straw    Nectar Thick Nectar Thick Liquid: Not tested   Honey Thick Honey Thick Liquid: Not tested   Puree Puree: Within functional limits Presentation: Spoon   Solid     Solid: Within functional limits Presentation: Self Fed     Thank you,  Genene Churn, Prue  PORTER,DABNEY 07/29/2018,11:17 AM

## 2018-07-29 NOTE — Progress Notes (Signed)
Subjective: She says she feels a little bit better.  She just got a breathing treatment and steroids.  She still not able to cough anything up.  Objective: Vital signs in last 24 hours: Temp:  [97.9 F (36.6 C)-98 F (36.7 C)] 97.9 F (36.6 C) (04/26 0506) Pulse Rate:  [108-117] 117 (04/26 0506) Resp:  [15-17] 16 (04/26 0506) BP: (108-134)/(72-85) 119/76 (04/26 0506) SpO2:  [94 %-100 %] 99 % (04/26 0730) Weight change:  Last BM Date: 07/26/18  Intake/Output from previous day: 04/25 0701 - 04/26 0700 In: 2170 [P.O.:1920; IV Piggyback:250] Out: 4650 [Urine:4650]  PHYSICAL EXAM General appearance: alert, cooperative and mild distress Resp: rales bilaterally Cardio: regular rate and rhythm, S1, S2 normal, no murmur, click, rub or gallop GI: soft, non-tender; bowel sounds normal; no masses,  no organomegaly Extremities: extremities normal, atraumatic, no cyanosis or edema  Lab Results:  Results for orders placed or performed during the hospital encounter of 07/27/18 (from the past 48 hour(s))  SARS Coronavirus 2 [facility] order, Performed in [place] hospital lab)     Status: None   Collection Time: 07/27/18 11:36 AM  Result Value Ref Range   SARS Coronavirus 2 NEGATIVE NEGATIVE    Comment: (NOTE) If result is NEGATIVE SARS-CoV-2 target nucleic acids are NOT DETECTED. The SARS-CoV-2 RNA is generally detectable in upper and lower  respiratory specimens during the acute phase of infection. The lowest  concentration of SARS-CoV-2 viral copies this assay can detect is 250  copies / mL. A negative result does not preclude SARS-CoV-2 infection  and should not be used as the sole basis for treatment or other  patient management decisions.  A negative result may occur with  improper specimen collection / handling, submission of specimen other  than nasopharyngeal swab, presence of viral mutation(s) within the  areas targeted by this assay, and inadequate number of viral copies   (<250 copies / mL). A negative result must be combined with clinical  observations, patient history, and epidemiological information. If result is POSITIVE SARS-CoV-2 target nucleic acids are DETECTED. The SARS-CoV-2 RNA is generally detectable in upper and lower  respiratory specimens dur ing the acute phase of infection.  Positive  results are indicative of active infection with SARS-CoV-2.  Clinical  correlation with patient history and other diagnostic information is  necessary to determine patient infection status.  Positive results do  not rule out bacterial infection or co-infection with other viruses. If result is PRESUMPTIVE POSTIVE SARS-CoV-2 nucleic acids MAY BE PRESENT.   A presumptive positive result was obtained on the submitted specimen  and confirmed on repeat testing.  While 2019 novel coronavirus  (SARS-CoV-2) nucleic acids may be present in the submitted sample  additional confirmatory testing may be necessary for epidemiological  and / or clinical management purposes  to differentiate between  SARS-CoV-2 and other Sarbecovirus currently known to infect humans.  If clinically indicated additional testing with an alternate test  methodology [phone]) is advised. The SARS-CoV-2 RNA is generally  detectable in upper and lower respiratory sp ecimens during the acute  phase of infection. The expected result is Negative. Fact Sheet for Patients:  StrictlyIdeas.no Fact Sheet for Healthcare Providers: BankingDealers.co.za This test is not yet approved or cleared by the [place] FDA and has been authorized for detection and/or diagnosis of SARS-CoV-2 by FDA under an Emergency Use Authorization (EUA).  This EUA will remain in effect (meaning this test can be used) for the duration of the COVID-19 declaration under  Section 564(b)(1) of the Act, 21 U.S.C. section 360bbb-3(b)(1), unless the authorization is terminated  or revoked sooner. Performed at [facility], [address] [place], [place] [address]   Comprehensive metabolic panel     Status: Abnormal   Collection Time: 07/27/18 12:06 PM  Result Value Ref Range   Sodium 140 135 - 145 mmol/L   Potassium 2.9 (L) 3.5 - 5.1 mmol/L   Chloride 105 98 - 111 mmol/L   CO2 26 22 - 32 mmol/L   Glucose, Bld 114 (H) 70 - 99 mg/dL   BUN 16 6 - 20 mg/dL   Creatinine, Ser 0.57 0.44 - 1.00 mg/dL   Calcium 8.8 (L) 8.9 - 10.3 mg/dL   Total Protein 6.2 (L) 6.5 - 8.1 g/dL   Albumin 2.7 (L) 3.5 - 5.0 g/dL   AST 22 15 - 41 U/L   ALT 13 0 - 44 U/L   Alkaline Phosphatase 109 38 - 126 U/L   Total Bilirubin 0.2 (L) 0.3 - 1.2 mg/dL   GFR calc non Af Amer >60 >60 mL/min   GFR calc Af Amer >60 >60 mL/min   Anion gap 9 5 - 15    Comment: Performed at [facility], [address], [place], [place] [address]  CBC with Differential     Status: Abnormal   Collection Time: 07/27/18 12:06 PM  Result Value Ref Range   WBC 13.8 (H) 4.0 - 10.5 K/uL   RBC 3.84 (L) 3.87 - 5.11 MIL/uL   Hemoglobin 11.8 (L) 12.0 - 15.0 g/dL   HCT 37.0 36.0 - 46.0 %   MCV 96.4 80.0 - 100.0 fL   MCH 30.7 26.0 - 34.0 pg   MCHC 31.9 30.0 - 36.0 g/dL   RDW 16.8 (H) 11.5 - 15.5 %   Platelets 594 (H) 150 - 400 K/uL   nRBC 0.0 0.0 - 0.2 %   Neutrophils Relative % 56 %   Neutro Abs 7.7 1.7 - 7.7 K/uL   Lymphocytes Relative 29 %   Lymphs Abs 4.0 0.7 - 4.0 K/uL   Monocytes Relative 8 %   Monocytes Absolute 1.1 (H) 0.1 - 1.0 K/uL   Eosinophils Relative 6 %   Eosinophils Absolute 0.8 (H) 0.0 - 0.5 K/uL   Basophils Relative 1 %   Basophils Absolute 0.1 0.0 - 0.1 K/uL   Immature Granulocytes 0 %   Abs Immature Granulocytes 0.06 0.00 - 0.07 K/uL    Comment: Performed at [facility], [address], [place], [place] [address]  Troponin I - Once     Status: None   Collection Time: 07/27/18 12:06 PM  Result Value Ref Range   Troponin I <0.03 <0.03 ng/mL    Comment: Performed at [facility],  [address] [place], [place] [address]  Brain natriuretic peptide     Status: None   Collection Time: 07/27/18 12:08 PM  Result Value Ref Range   B Natriuretic Peptide 25.0 0.0 - 100.0 pg/mL    Comment: Performed at [facility], [address] [place], [place] [address]  Blood culture (routine x 2)     Status: None (Preliminary result)   Collection Time: 07/27/18 12:09 PM  Result Value Ref Range   Specimen Description      BLOOD RIGHT ARM BOTTLES DRAWN AEROBIC AND ANAEROBIC   Special Requests      Blood Culture results may not be optimal due to an excessive volume of blood received in culture bottles   Culture  NO GROWTH 2 DAYS Performed at [facility], [address], [place], [place] [address]    Report Status PENDING   Lactic acid, plasma     Status: None   Collection Time: 07/27/18 12:10 PM  Result Value Ref Range   Lactic Acid, Venous 1.1 0.5 - 1.9 mmol/L    Comment: Performed at [facility], [address] [place], [place] [address]  Blood culture (routine x 2)     Status: None (Preliminary result)   Collection Time: 07/27/18 12:18 PM  Result Value Ref Range   Specimen Description BLOOD LEFT ARM BOTTLES DRAWN AEROBIC AND ANAEROBIC    Special Requests Blood Culture adequate volume    Culture      NO GROWTH 2 DAYS Performed at [facility], [address] [place], [place] [address]    Report Status PENDING   Lactic acid, plasma     Status: None   Collection Time: 07/27/18  2:00 PM  Result Value Ref Range   Lactic Acid, Venous 0.8 0.5 - 1.9 mmol/L    Comment: Performed at [facility], [address] [place], [place] [address]  Magnesium     Status: None   Collection Time: 07/27/18  2:00 PM  Result Value Ref Range   Magnesium 1.9 1.7 - 2.4 mg/dL    Comment: Performed at [facility], [address], [place], [place] [address]  Procalcitonin - Baseline     Status: None   Collection Time: 07/27/18  2:00 PM  Result Value Ref Range   Procalcitonin 5.87 ng/mL    Comment:         Interpretation: PCT > 2 ng/mL: Systemic infection (sepsis) is likely, unless other causes are known. (NOTE)       Sepsis PCT Algorithm           Lower Respiratory Tract                                      Infection PCT Algorithm    ----------------------------     ----------------------------         PCT < 0.25 ng/mL                PCT < 0.10 ng/mL         Strongly encourage             Strongly discourage   discontinuation of antibiotics    initiation of antibiotics    ----------------------------     -----------------------------       PCT 0.25 - 0.50 ng/mL            PCT 0.10 - 0.25 ng/mL               OR       >80% decrease in PCT            Discourage initiation of                                            antibiotics      Encourage discontinuation           of antibiotics    ----------------------------     -----------------------------         PCT >= 0.50 ng/mL  PCT 0.26 - 0.50 ng/mL               AND       <80% decrease in PCT              Encourage initiation of                                             antibiotics       Encourage continuation           of antibiotics    ----------------------------     -----------------------------        PCT >= 0.50 ng/mL                  PCT > 0.50 ng/mL               AND         increase in PCT                  Strongly encourage                                      initiation of antibiotics    Strongly encourage escalation           of antibiotics                                     -----------------------------                                           PCT <= 0.25 ng/mL                                                 OR                                        > 80% decrease in PCT                                     Discontinue / Do not initiate                                             antibiotics Performed at [facility], [address] [place], [place] [address]   Respiratory Panel by PCR      Status: None   Collection Time: 07/27/18  3:14 PM  Result Value Ref Range   Adenovirus NOT DETECTED NOT DETECTED   Coronavirus 229E NOT DETECTED NOT DETECTED    Comment: (NOTE) The Coronavirus on the Respiratory Panel, DOES NOT test for the novel  Coronavirus (2019 nCoV)    Coronavirus HKU1 NOT DETECTED NOT DETECTED  Coronavirus NL63 NOT DETECTED NOT DETECTED   Coronavirus OC43 NOT DETECTED NOT DETECTED   Metapneumovirus NOT DETECTED NOT DETECTED   Rhinovirus / Enterovirus NOT DETECTED NOT DETECTED   Influenza A NOT DETECTED NOT DETECTED   Influenza B NOT DETECTED NOT DETECTED   Parainfluenza Virus 1 NOT DETECTED NOT DETECTED   Parainfluenza Virus 2 NOT DETECTED NOT DETECTED   Parainfluenza Virus 3 NOT DETECTED NOT DETECTED   Parainfluenza Virus 4 NOT DETECTED NOT DETECTED   Respiratory Syncytial Virus NOT DETECTED NOT DETECTED   Bordetella pertussis NOT DETECTED NOT DETECTED   Chlamydophila pneumoniae NOT DETECTED NOT DETECTED   Mycoplasma pneumoniae NOT DETECTED NOT DETECTED    Comment: Performed at [facility], [place] [address] [place], [place] [address]  MRSA PCR Screening     Status: None   Collection Time: 07/27/18  3:14 PM  Result Value Ref Range   MRSA by PCR NEGATIVE NEGATIVE    Comment:        The GeneXpert MRSA Assay (FDA approved for NASAL specimens only), is one component of a comprehensive MRSA colonization surveillance program. It is not intended to diagnose MRSA infection nor to guide or monitor treatment for MRSA infections. Performed at [facility], [address] [place], [place] [address]   Magnesium     Status: None   Collection Time: 07/28/18  6:20 AM  Result Value Ref Range   Magnesium 1.7 1.7 - 2.4 mg/dL    Comment: Performed at [facility], [address] [place], [place] [address]  Basic metabolic panel     Status: Abnormal   Collection Time: 07/28/18  6:20 AM  Result Value Ref Range   Sodium 140 135 - 145 mmol/L   Potassium 3.8 3.5  - 5.1 mmol/L    Comment: DELTA CHECK NOTED NO VISIBLE HEMOLYSIS    Chloride 108 98 - 111 mmol/L   CO2 25 22 - 32 mmol/L   Glucose, Bld 89 70 - 99 mg/dL   BUN 6 6 - 20 mg/dL   Creatinine, Ser 0.45 0.44 - 1.00 mg/dL   Calcium 8.6 (L) 8.9 - 10.3 mg/dL   GFR calc non Af Amer >60 >60 mL/min   GFR calc Af Amer >60 >60 mL/min   Anion gap 7 5 - 15    Comment: Performed at [facility], [address] [place], [place] [address]  CBC     Status: Abnormal   Collection Time: 07/28/18  6:20 AM  Result Value Ref Range   WBC 12.6 (H) 4.0 - 10.5 K/uL   RBC 3.59 (L) 3.87 - 5.11 MIL/uL   Hemoglobin 11.0 (L) 12.0 - 15.0 g/dL   HCT 35.2 (L) 36.0 - 46.0 %   MCV 98.1 80.0 - 100.0 fL   MCH 30.6 26.0 - 34.0 pg   MCHC 31.3 30.0 - 36.0 g/dL   RDW 16.8 (H) 11.5 - 15.5 %   Platelets 532 (H) 150 - 400 K/uL   nRBC 0.0 0.0 - 0.2 %    Comment: Performed at [facility], [address], [place], [place] [address]  Sedimentation rate     Status: Abnormal   Collection Time: 07/28/18  6:20 AM  Result Value Ref Range   Sed Rate 39 (H) 0 - 22 mm/hr    Comment: Performed at [facility], [address], [place], [place] [address]  Basic metabolic panel     Status: Abnormal   Collection Time: 07/29/18  6:27 AM  Result Value  Ref Range   Sodium 136 135 - 145 mmol/L   Potassium 5.1 3.5 - 5.1 mmol/L    Comment: DELTA CHECK NOTED NO VISIBLE HEMOLYSIS    Chloride 101 98 - 111 mmol/L   CO2 27 22 - 32 mmol/L   Glucose, Bld 140 (H) 70 - 99 mg/dL   BUN 6 6 - 20 mg/dL   Creatinine, Ser 0.51 0.44 - 1.00 mg/dL   Calcium 9.4 8.9 - 10.3 mg/dL   GFR calc non Af Amer >60 >60 mL/min   GFR calc Af Amer >60 >60 mL/min   Anion gap 8 5 - 15    Comment: Performed at [facility], [address], [place], [place] [address]  CBC     Status: Abnormal   Collection Time: 07/29/18  6:27 AM  Result Value Ref Range   WBC 6.9 4.0 - 10.5 K/uL   RBC 3.69 (L) 3.87 - 5.11 MIL/uL   Hemoglobin 11.4 (L) 12.0 - 15.0 g/dL   HCT 36.4 36.0 -  46.0 %   MCV 98.6 80.0 - 100.0 fL   MCH 30.9 26.0 - 34.0 pg   MCHC 31.3 30.0 - 36.0 g/dL   RDW 16.5 (H) 11.5 - 15.5 %   Platelets 529 (H) 150 - 400 K/uL   nRBC 0.0 0.0 - 0.2 %    Comment: Performed at [facility], [address] [place], [place] [address]    ABGS No results for input(s): PHART, PO2ART, TCO2, HCO3 in the last 72 hours.  Invalid input(s): PCO2 CULTURES Recent Results (from the past 240 hour(s))  SARS Coronavirus 2 [facility] order, Performed in [facility] hospital lab)     Status: None   Collection Time: 07/27/18 11:36 AM  Result Value Ref Range Status   SARS Coronavirus 2 NEGATIVE NEGATIVE Final    Comment: (NOTE) If result is NEGATIVE SARS-CoV-2 target nucleic acids are NOT DETECTED. The SARS-CoV-2 RNA is generally detectable in upper and lower  respiratory specimens during the acute phase of infection. The lowest  concentration of SARS-CoV-2 viral copies this assay can detect is 250  copies / mL. A negative result does not preclude SARS-CoV-2 infection  and should not be used as the sole basis for treatment or other  patient management decisions.  A negative result may occur with  improper specimen collection / handling, submission of specimen other  than nasopharyngeal swab, presence of viral mutation(s) within the  areas targeted by this assay, and inadequate number of viral copies  (<250 copies / mL). A negative result must be combined with clinical  observations, patient history, and epidemiological information. If result is POSITIVE SARS-CoV-2 target nucleic acids are DETECTED. The SARS-CoV-2 RNA is generally detectable in upper and lower  respiratory specimens dur ing the acute phase of infection.  Positive  results are indicative of active infection with SARS-CoV-2.  Clinical  correlation with patient history and other diagnostic information is  necessary to determine patient infection status.  Positive results do  not rule out bacterial infection  or co-infection with other viruses. If result is PRESUMPTIVE POSTIVE SARS-CoV-2 nucleic acids MAY BE PRESENT.   A presumptive positive result was obtained on the submitted specimen  and confirmed on repeat testing.  While 2019 novel coronavirus  (SARS-CoV-2) nucleic acids may be present in the submitted sample  additional confirmatory testing may be necessary for epidemiological  and / or clinical management purposes  to differentiate between  SARS-CoV-2 and other Sarbecovirus currently known to infect humans.  If clinically indicated additional testing with an alternate test  methodology [phone]) is advised. The SARS-CoV-2 RNA is generally  detectable in upper and lower respiratory sp ecimens during the acute  phase of infection. The expected result is Negative. Fact Sheet for Patients:  StrictlyIdeas.no Fact Sheet for Healthcare Providers: BankingDealers.co.za This test is not yet approved or cleared by the [place] FDA and has been authorized for detection and/or diagnosis of SARS-CoV-2 by FDA under an Emergency Use Authorization (EUA).  This EUA will remain in effect (meaning this test can be used) for the duration of the COVID-19 declaration under Section 564(b)(1) of the Act, 21 U.S.C. section 360bbb-3(b)(1), unless the authorization is terminated or revoked sooner. Performed at [facility], [address] [place], [place] [address]   Blood culture (routine x 2)     Status: None (Preliminary result)   Collection Time: 07/27/18 12:09 PM  Result Value Ref Range Status   Specimen Description   Final    BLOOD RIGHT ARM BOTTLES DRAWN AEROBIC AND ANAEROBIC   Special Requests   Final    Blood Culture results may not be optimal due to an excessive volume of blood received in culture bottles   Culture   Final    NO GROWTH 2 DAYS Performed at [facility], [address] [place], [place] [address]    Report Status PENDING   Incomplete  Blood culture (routine x 2)     Status: None (Preliminary result)   Collection Time: 07/27/18 12:18 PM  Result Value Ref Range Status   Specimen Description BLOOD LEFT ARM BOTTLES DRAWN AEROBIC AND ANAEROBIC  Final   Special Requests Blood Culture adequate volume  Final   Culture   Final    NO GROWTH 2 DAYS Performed at [facility], [address], [place], [place] [address]    Report Status PENDING  Incomplete  Respiratory Panel by PCR     Status: None   Collection Time: 07/27/18  3:14 PM  Result Value Ref Range Status   Adenovirus NOT DETECTED NOT DETECTED Final   Coronavirus 229E NOT DETECTED NOT DETECTED Final    Comment: (NOTE) The Coronavirus on the Respiratory Panel, DOES NOT test for the novel  Coronavirus (2019 nCoV)    Coronavirus HKU1 NOT DETECTED NOT DETECTED Final   Coronavirus NL63 NOT DETECTED NOT DETECTED Final   Coronavirus OC43 NOT DETECTED NOT DETECTED Final   Metapneumovirus NOT DETECTED NOT DETECTED Final   Rhinovirus / Enterovirus NOT DETECTED NOT DETECTED Final   Influenza A NOT DETECTED NOT DETECTED Final   Influenza B NOT DETECTED NOT DETECTED Final   Parainfluenza Virus 1 NOT DETECTED NOT DETECTED Final   Parainfluenza Virus 2 NOT DETECTED NOT DETECTED Final   Parainfluenza Virus 3 NOT DETECTED NOT DETECTED Final   Parainfluenza Virus 4 NOT DETECTED NOT DETECTED Final   Respiratory Syncytial Virus NOT DETECTED NOT DETECTED Final   Bordetella pertussis NOT DETECTED NOT DETECTED Final   Chlamydophila pneumoniae NOT DETECTED NOT DETECTED Final   Mycoplasma pneumoniae NOT DETECTED NOT DETECTED Final    Comment: Performed at [facility], [place] [address] [place], [place] [address]  MRSA PCR Screening     Status: None   Collection Time: 07/27/18  3:14 PM  Result Value Ref Range Status   MRSA by PCR NEGATIVE NEGATIVE Final    Comment:        The GeneXpert MRSA Assay (FDA approved for NASAL specimens only), is one component of  a comprehensive MRSA colonization surveillance program. It is not intended to diagnose MRSA infection nor to guide or monitor treatment for MRSA infections. Performed at [facility], [address] [place], [place] [address]    Studies/Results: Ct Angio Chest Pe W And/or Wo Contrast  Result Date: 07/27/2018 CLINICAL DATA:  Shortness of breath and pain. Decreased oxygen saturation EXAM: CT ANGIOGRAPHY CHEST WITH CONTRAST TECHNIQUE: Multidetector CT imaging of the chest was performed using the standard protocol during bolus administration of intravenous contrast. Multiplanar CT image reconstructions and MIPs were obtained to evaluate the vascular anatomy. CONTRAST:  150mL OMNIPAQUE IOHEXOL 350 MG/ML SOLN COMPARISON:  Chest CT May 23, 2018; chest radiograph July 27, 2018 FINDINGS: Cardiovascular: There is no demonstrable pulmonary embolus. There is no thoracic aortic aneurysm or dissection. The visualized great vessels appear unremarkable except for slight calcification at the origins of the respective great vessels. There is mild aortic atherosclerosis. There is no pericardial effusion or pericardial thickening. Mediastinum/Nodes: Thyroid appears unremarkable. There are multiple subcentimeter mediastinal lymph nodes. There is a precarinal lymph node measuring 1.1 x 1.1 cm. There is an aortopulmonary window lymph node measuring 1.4 x 1.0 cm. There is a subcarinal lymph node measuring 1.0 x 1.0 cm. There is a right hilar lymph node measuring 1.1 x 1.0 cm. There is thickening throughout the wall of the mid and distal thirds of the esophagus. Lungs/Pleura: There is widespread fibrosis throughout the lungs diffusely with scattered small bullae. There is no frank consolidation. There is no appreciable pleural effusion or pleural thickening. Upper Abdomen: There are surgical clips in the region of the porta. Visualized upper abdominal structures otherwise appear unremarkable. Musculoskeletal: There are no  blastic or lytic bone lesions. There are no appreciable chest wall lesions. Review of the MIP images confirms the above findings. IMPRESSION: 1. No demonstrable pulmonary embolus. No thoracic aortic aneurysm or dissection. There is mild aortic atherosclerosis. 2. Extensive fibrosis throughout the lungs diffusely. No frank edema or consolidation. 3. Several prominent lymph nodes which may well be of reactive etiology given the extensive parenchymal lung changes. 4. Thickening of the esophageal wall throughout the mid and distal thirds. Suspect a degree of esophagitis. Aortic Atherosclerosis (ICD10-I70.0). Electronically Signed   By: Lowella Grip III M.D.   On: 07/27/2018 14:45   Dg Chest Portable 1 View  Result Date: 07/27/2018 CLINICAL DATA:  Shortness of breath EXAM: PORTABLE CHEST 1 VIEW COMPARISON:  06/06/2018 FINDINGS: Cardiac shadow is stable. Diffuse fibrotic changes are noted throughout both lungs somewhat increased when compared with the prior exam. This may represent some mild acute on chronic infiltrate particularly in the right lung. No sizable effusion is seen. No bony abnormality is noted. IMPRESSION: Increased density in the right lung representing some acute on chronic infiltrate. Electronically Signed   By: Inez Catalina M.D.   On: 07/27/2018 11:36    Medications:  Prior to Admission:  Medications Prior to Admission  Medication Sig Dispense Refill Last Dose  . acetaminophen (TYLENOL) 500 MG tablet Take 1 tablet (500 mg total) by mouth every 8 (eight) hours as needed for moderate pain. 30 tablet 0 Past Week at Unknown time  . apixaban (ELIQUIS) 5 MG TABS tablet Take 1 tablet (5 mg total) by mouth 2 (two) times daily. 2 pills mouth twice a day till 06/12/2018 then switch to 1 pill twice a day from 06/13/2018 (Patient taking differently: Take 10 mg by mouth 2 (two) times daily. ) 60 tablet 0 07/26/2018 at 0500pm  .  cetirizine (ZYRTEC) 10 MG tablet Take 10 mg by mouth daily.   07/27/2018 at  Unknown time  . FLUoxetine (PROZAC) 40 MG capsule Take 1 capsule (40 mg total) by mouth daily. For depression and anxiety. 30 capsule 0 07/27/2018 at Unknown time  . guaiFENesin (MUCINEX) 600 MG 12 hr tablet Take 800 mg by mouth 2 (two) times daily.    07/26/2018 at Unknown time  . pantoprazole (PROTONIX) 40 MG tablet Take 1 tablet (40 mg total) by mouth 2 (two) times daily. 60 tablet 0 07/27/2018 at Unknown time  . Probiotic Product (PROBIOTIC PO) Take 1 tablet by mouth daily. Women's probiotic for GI health   07/26/2018 at Unknown time  . divalproex (DEPAKOTE ER) 500 MG 24 hr tablet Take 500 mg by mouth at bedtime. For moods   Not Taking at Unknown time  . lactose free nutrition (BOOST PLUS) LIQD Take 237 mLs by mouth 3 (three) times daily with meals. (Patient not taking: Reported on 07/27/2018) 30 Can 0 Not Taking at Unknown time  . Melatonin 5 MG CAPS Take 20 mg by mouth at bedtime.    Not Taking at Unknown time  . metoprolol tartrate (LOPRESSOR) 25 MG tablet Take 1 tablet (25 mg total) by mouth 2 (two) times daily. (Patient not taking: Reported on 07/27/2018) 60 tablet 0 Not Taking at Unknown time  . ziprasidone (GEODON) 80 MG capsule Take one capsule at bedtime for depression and mood stability. (Patient not taking: Reported on 07/27/2018) 30 capsule 0 Not Taking at Unknown time   Scheduled: . apixaban  5 mg Oral BID  . benzonatate  200 mg Oral TID  . budesonide (PULMICORT) nebulizer solution  0.25 mg Nebulization BID  . divalproex  500 mg Oral QHS  . FLUoxetine  40 mg Oral Daily  . guaiFENesin  600 mg Oral BID  . HYDROcodone-acetaminophen  1 tablet Oral Q6H  . ipratropium  0.5 mg Nebulization Q6H WA  . levalbuterol  1.25 mg Nebulization Q6H WA  . loratadine  10 mg Oral Daily  . methylPREDNISolone (SOLU-MEDROL) injection  40 mg Intravenous Q12H  . metoprolol tartrate  5 mg Intravenous Once  . pantoprazole  40 mg Oral BID  . potassium chloride SA  40 mEq Oral TID  . sodium chloride flush  3  mL Intravenous Q12H   Continuous: . sodium chloride    . levofloxacin (LEVAQUIN) IV 500 mg (07/28/18 1519)  . piperacillin-tazobactam (ZOSYN)  IV 3.375 g (07/29/18 0535)   HYI:FOYDXA chloride, acetaminophen, fentaNYL (SUBLIMAZE) injection, guaiFENesin-dextromethorphan, ondansetron **OR** ondansetron (ZOFRAN) IV, sodium chloride flush  Assesment: She was admitted with acute hypoxic respiratory failure.  Apparently she was supposed to go home from the hospital at her last admission on oxygen but she says she never got it.  She had some groundglass opacities on CT done during her last hospitalization but now she has extensive fibrotic changes.  This could be chronic organizing pneumonia after her severe medical illness but her sedimentation rate was not particularly high.  I do think she is a little bit better with steroids.  She could also have an acute interstitial pneumonia and she is on Levaquin for that. Active Problems:   Acute hypoxemic respiratory failure ([place])    Plan: Continue current treatments.  Add flutter valve.    LOS: 2 days   Christine Ortega 07/29/2018, 9:33 AM

## 2018-07-30 LAB — BASIC METABOLIC PANEL
Anion gap: 10 (ref 5–15)
BUN: 8 mg/dL (ref 6–20)
CO2: 29 mmol/L (ref 22–32)
Calcium: 9.7 mg/dL (ref 8.9–10.3)
Chloride: 98 mmol/L (ref 98–111)
Creatinine, Ser: 0.73 mg/dL (ref 0.44–1.00)
GFR calc Af Amer: >60 mL/min (ref >60)
GFR calc non Af Amer: >60 mL/min (ref >60)
Glucose, Bld: 127 mg/dL — ABNORMAL HIGH (ref 70–99)
Potassium: 4.8 mmol/L (ref 3.5–5.1)
Sodium: 137 mmol/L (ref 135–145)

## 2018-07-30 LAB — CBC
HCT: 36.8 % (ref 36.0–46.0)
Hemoglobin: 11.5 g/dL — ABNORMAL LOW (ref 12.0–15.0)
MCH: 31 pg (ref 26.0–34.0)
MCHC: 31.3 g/dL (ref 30.0–36.0)
MCV: 99.2 fL (ref 80.0–100.0)
Platelets: 669 10*3/uL — ABNORMAL HIGH (ref 150–400)
RBC: 3.71 MIL/uL — ABNORMAL LOW (ref 3.87–5.11)
RDW: 16.6 % — ABNORMAL HIGH (ref 11.5–15.5)
WBC: 12.3 10*3/uL — ABNORMAL HIGH (ref 4.0–10.5)
nRBC: 0 % (ref 0.0–0.2)

## 2018-07-30 NOTE — Progress Notes (Signed)
PROGRESS NOTE    DEBORRAH Ortega  KCM:[id number] DOB: [birth year] DOA: 07/27/2018 PCP: Lucia Gaskins, MD   Brief Narrative:  Per HPI: Christine L Robertsis a 58 y.o.femalewith medical history significant foranxiety/depression, large-gastric ulcer, prior DVT on Eliquis, bipolar disorder, schizophrenia, and chronic pain who presented to the emergency department with worsening dyspnea and hypoxemia checked by home health nurse with saturations in the 70th percentile. She has had increased work of breathing recently and persistent dry cough with no hemoptysis or chest pain. She denies any sick contacts and has been at home since her last discharge with no recent travel. She denies any fevers or chills and has no significant lower extremity edema.  She was admitted with acute hypoxemic respiratory failure secondary to right-sided pneumonia, but has CT chest findings of interstitial fibrosis and possibly interstitial pneumonia. Appreciate pulmonology evaluation this a.m. with addition of Levaquin and ESR pending. IV Solu-Medrol also added. She continues to remain severely symptomatic, but is slowly improving.  Assessment & Plan:   Active Problems:   Acute hypoxemic respiratory failure ([place])   1. Acute hypoxemic respiratory failure secondary to right-sided pneumoniaand possible interstitial process with significant fibrosis noted on CT chest.  It appears that patient may have interstitial pneumonia versus COP. Discontinued vancomycin due to MRSA nares negative. Continue on Zosyn as well as Levaquin added by pulmonology-sed rate low, but still improving on IV steroids. Respiratory panel noted to be negative. COVID-19 testing noted to be negative as well. Continue IV steroids today and continue with duo nebs as well as Pulmicort. Pain management for chest and back given recurrent cough. 2. Hypokalemia-resolved. Recheck in am.  Hold further supplementation. 3. Prior right lower  extremity DVT and left upper extremity DVT. Continue on home Eliquis. 4. ADD/bipolar/depression/schizophrenia. Continue home medications. 5. Dysphagia. Continue on dysphagia 3 diet now; appreciate SLP evaluation. 6. Chronic pain.   Continue on around-the-clock narcotic pain medication for assistance in pain management with IV narcotics for breakthrough.   DVT prophylaxis:Eliquis Code Status:Full Family Communication:None at bedside Disposition Plan:Appreciate pulmonology recommendations. Continue on current IV antibiotics and monitor ongoing symptomatic improvement.   Consultants:  Pulmonology  Procedures:  None  Antimicrobials:  Zosyn 4/24->  Vancomycin 4/24-4/25  Levaquin 4/25->   Subjective: Patient seen and evaluated today with no new acute complaints or concerns. No acute concerns or events noted overnight.  She appears to be slowly improving and has much less cough and overall pain noted this morning.  Objective: Vitals:   07/29/18 2115 07/30/18 0131 07/30/18 0534 07/30/18 0737  BP: 134/88  130/90   Pulse: 82  (!) 109   Resp: 20  20   Temp:   98.1 F (36.7 C)   TempSrc:   Oral   SpO2: 97% 98% 98% 97%  Weight:      Height:        Intake/Output Summary (Last 24 hours) at 07/30/2018 0942 Last data filed at 07/30/2018 0500 Gross per 24 hour  Intake 480 ml  Output 4200 ml  Net -3720 ml   Filed Weights   07/27/18 1102 07/27/18 1627  Weight: 54.4 kg 54.5 kg    Examination:  General exam: Appears calm and comfortable  Respiratory system: Clear to auscultation. Respiratory effort normal. On . Cardiovascular system: S1 & S2 heard, RRR. No JVD, murmurs, rubs, gallops or clicks. No pedal edema. Gastrointestinal system: Abdomen is nondistended, soft and nontender. No organomegaly or masses felt. Normal bowel sounds heard. Central nervous system: Alert and oriented. No  focal neurological deficits. Extremities: Symmetric 5 x 5 power. Skin:  No rashes, lesions or ulcers Psychiatry: Judgement and insight appear normal. Mood & affect appropriate.     Data Reviewed: I have personally reviewed following labs and imaging studies  CBC: Recent Labs  Lab 07/27/18 1206 07/28/18 0620 07/29/18 0627 07/30/18 0427  WBC 13.8* 12.6* 6.9 12.3*  NEUTROABS 7.7  --   --   --   HGB 11.8* 11.0* 11.4* 11.5*  HCT 37.0 35.2* 36.4 36.8  MCV 96.4 98.1 98.6 99.2  PLT 594* 532* 529* 638*   Basic Metabolic Panel: Recent Labs  Lab 07/27/18 1206 07/27/18 1400 07/28/18 0620 07/29/18 0627 07/30/18 0427  NA 140  --  140 136 137  K 2.9*  --  3.8 5.1 4.8  CL 105  --  108 101 98  CO2 26  --  '25 27 29  ' GLUCOSE 114*  --  89 140* 127*  BUN 16  --  '6 6 8  ' CREATININE 0.57  --  0.45 0.51 0.73  CALCIUM 8.8*  --  8.6* 9.4 9.7  MG  --  1.9 1.7  --   --    GFR: Estimated Creatinine Clearance: 64.2 mL/min (by C-G formula based on SCr of 0.73 mg/dL). Liver Function Tests: Recent Labs  Lab 07/27/18 1206  AST 22  ALT 13  ALKPHOS 109  BILITOT 0.2*  PROT 6.2*  ALBUMIN 2.7*   No results for input(s): LIPASE, AMYLASE in the last 168 hours. No results for input(s): AMMONIA in the last 168 hours. Coagulation Profile: No results for input(s): INR, PROTIME in the last 168 hours. Cardiac Enzymes: Recent Labs  Lab 07/27/18 1206  TROPONINI <0.03   BNP (last 3 results) No results for input(s): PROBNP in the last 8760 hours. HbA1C: No results for input(s): HGBA1C in the last 72 hours. CBG: No results for input(s): GLUCAP in the last 168 hours. Lipid Profile: No results for input(s): CHOL, HDL, LDLCALC, TRIG, CHOLHDL, LDLDIRECT in the last 72 hours. Thyroid Function Tests: No results for input(s): TSH, T4TOTAL, FREET4, T3FREE, THYROIDAB in the last 72 hours. Anemia Panel: No results for input(s): VITAMINB12, FOLATE, FERRITIN, TIBC, IRON, RETICCTPCT in the last 72 hours. Sepsis Labs: Recent Labs  Lab 07/27/18 1210 07/27/18 1400  PROCALCITON   --  5.87  LATICACIDVEN 1.1 0.8    Recent Results (from the past 240 hour(s))  SARS Coronavirus 2 [facility] order, Performed in [place] hospital lab)     Status: None   Collection Time: 07/27/18 11:36 AM  Result Value Ref Range Status   SARS Coronavirus 2 NEGATIVE NEGATIVE Final    Comment: (NOTE) If result is NEGATIVE SARS-CoV-2 target nucleic acids are NOT DETECTED. The SARS-CoV-2 RNA is generally detectable in upper and lower  respiratory specimens during the acute phase of infection. The lowest  concentration of SARS-CoV-2 viral copies this assay can detect is 250  copies / mL. A negative result does not preclude SARS-CoV-2 infection  and should not be used as the sole basis for treatment or other  patient management decisions.  A negative result may occur with  improper specimen collection / handling, submission of specimen other  than nasopharyngeal swab, presence of viral mutation(s) within the  areas targeted by this assay, and inadequate number of viral copies  (<250 copies / mL). A negative result must be combined with clinical  observations, patient history, and epidemiological information. If result is POSITIVE SARS-CoV-2 target nucleic acids are DETECTED. The  SARS-CoV-2 RNA is generally detectable in upper and lower  respiratory specimens dur ing the acute phase of infection.  Positive  results are indicative of active infection with SARS-CoV-2.  Clinical  correlation with patient history and other diagnostic information is  necessary to determine patient infection status.  Positive results do  not rule out bacterial infection or co-infection with other viruses. If result is PRESUMPTIVE POSTIVE SARS-CoV-2 nucleic acids MAY BE PRESENT.   A presumptive positive result was obtained on the submitted specimen  and confirmed on repeat testing.  While 2019 novel coronavirus  (SARS-CoV-2) nucleic acids may be present in the submitted sample  additional confirmatory  testing may be necessary for epidemiological  and / or clinical management purposes  to differentiate between  SARS-CoV-2 and other Sarbecovirus currently known to infect humans.  If clinically indicated additional testing with an alternate test  methodology [id number]) is advised. The SARS-CoV-2 RNA is generally  detectable in upper and lower respiratory sp ecimens during the acute  phase of infection. The expected result is Negative. Fact Sheet for Patients:  StrictlyIdeas.no Fact Sheet for Healthcare Providers: BankingDealers.co.za This test is not yet approved or cleared by the [place] FDA and has been authorized for detection and/or diagnosis of SARS-CoV-2 by FDA under an Emergency Use Authorization (EUA).  This EUA will remain in effect (meaning this test can be used) for the duration of the COVID-19 declaration under Section 564(b)(1) of the Act, 21 U.S.C. section 360bbb-3(b)(1), unless the authorization is terminated or revoked sooner. Performed at [facility], [address] [place], [place] [address]   Blood culture (routine x 2)     Status: None (Preliminary result)   Collection Time: 07/27/18 12:09 PM  Result Value Ref Range Status   Specimen Description   Final    BLOOD RIGHT ARM BOTTLES DRAWN AEROBIC AND ANAEROBIC   Special Requests   Final    Blood Culture results may not be optimal due to an excessive volume of blood received in culture bottles   Culture   Final    NO GROWTH 3 DAYS Performed at [facility], [address], [place], [place] [address]    Report Status PENDING  Incomplete  Blood culture (routine x 2)     Status: None (Preliminary result)   Collection Time: 07/27/18 12:18 PM  Result Value Ref Range Status   Specimen Description BLOOD LEFT ARM BOTTLES DRAWN AEROBIC AND ANAEROBIC  Final   Special Requests Blood Culture adequate volume  Final   Culture   Final    NO GROWTH 3 DAYS Performed at [facility], [address], [place], [place] [address]    Report Status PENDING  Incomplete  Respiratory Panel by PCR     Status: None   Collection Time: 07/27/18  3:14 PM  Result Value Ref Range Status   Adenovirus NOT DETECTED NOT DETECTED Final   Coronavirus 229E NOT DETECTED NOT DETECTED Final    Comment: (NOTE) The Coronavirus on the Respiratory Panel, DOES NOT test for the novel  Coronavirus (2019 nCoV)    Coronavirus HKU1 NOT DETECTED NOT DETECTED Final   Coronavirus NL63 NOT DETECTED NOT DETECTED Final   Coronavirus OC43 NOT DETECTED NOT DETECTED Final   Metapneumovirus NOT DETECTED NOT DETECTED Final   Rhinovirus / Enterovirus NOT DETECTED NOT DETECTED Final   Influenza A NOT DETECTED NOT DETECTED Final   Influenza B NOT DETECTED NOT DETECTED Final   Parainfluenza Virus 1 NOT DETECTED NOT DETECTED Final  Parainfluenza Virus 2 NOT DETECTED NOT DETECTED Final   Parainfluenza Virus 3 NOT DETECTED NOT DETECTED Final   Parainfluenza Virus 4 NOT DETECTED NOT DETECTED Final   Respiratory Syncytial Virus NOT DETECTED NOT DETECTED Final   Bordetella pertussis NOT DETECTED NOT DETECTED Final   Chlamydophila pneumoniae NOT DETECTED NOT DETECTED Final   Mycoplasma pneumoniae NOT DETECTED NOT DETECTED Final    Comment: Performed at [facility], [place] [address] [place], [place] [address]  MRSA PCR Screening     Status: None   Collection Time: 07/27/18  3:14 PM  Result Value Ref Range Status   MRSA by PCR NEGATIVE NEGATIVE Final    Comment:        The GeneXpert MRSA Assay (FDA approved for NASAL specimens only), is one component of a comprehensive MRSA colonization surveillance program. It is not intended to diagnose MRSA infection nor to guide or monitor treatment for MRSA infections. Performed at [facility], [address] [place], [place] [address]          Radiology Studies: No results found.      Scheduled Meds: . apixaban  5 mg Oral BID  . benzonatate   200 mg Oral TID  . budesonide (PULMICORT) nebulizer solution  0.25 mg Nebulization BID  . divalproex  500 mg Oral QHS  . fentaNYL (SUBLIMAZE) injection  50 mcg Intramuscular Once  . FLUoxetine  40 mg Oral Daily  . guaiFENesin  600 mg Oral BID  . HYDROcodone-acetaminophen  1-2 tablet Oral Q6H  . ipratropium  0.5 mg Nebulization Q6H WA  . levalbuterol  1.25 mg Nebulization Q6H WA  . lidocaine  1 patch Transdermal Q24H  . loratadine  10 mg Oral Daily  . methylPREDNISolone (SOLU-MEDROL) injection  40 mg Intravenous Q12H  . metoprolol tartrate  5 mg Intravenous Once  . pantoprazole  40 mg Oral BID  . sodium chloride flush  3 mL Intravenous Q12H   Continuous Infusions: . sodium chloride    . levofloxacin (LEVAQUIN) IV 500 mg (07/29/18 1020)  . piperacillin-tazobactam (ZOSYN)  IV 3.375 g (07/29/18 2139)     LOS: 3 days    Time spent: 30 minutes    Mehgan Santmyer Darleen Crocker, DO [facility] Pager [phone]  If 7PM-7AM, please contact night-coverage [email or web address] Password [facility] 07/30/2018, 9:42 AM

## 2018-07-30 NOTE — Progress Notes (Signed)
Pharmacy Antibiotic Note  Today is day #4 of  Zosyn  And day #3 of Levaquin therapy for this 58 yo female with  pneumonia. Blood and respiratory cultures have shown no growth to date.  Renal function appears stable at this time.  Plan: Continue Zosyn 3.375g IV q8h (4-hr infusion) Re-assess need for double gram negative coverage with Levaquin. Monitor labs, vitals and patient progress.  Height: 5\' 3"  (160 cm) Weight: 120 lb 2.4 oz (54.5 kg) IBW/kg (Calculated) : 52.4  Temp (24hrs), Avg:98 F (36.7 C), Min:97.9 F (36.6 C), Max:98.1 F (36.7 C)  Recent Labs  Lab 07/27/18 1206 07/27/18 1210 07/27/18 1400 07/28/18 0620 07/29/18 0627 07/30/18 0427  WBC 13.8*  --   --  12.6* 6.9 12.3*  CREATININE 0.57  --   --  0.45 0.51 0.73  LATICACIDVEN  --  1.1 0.8  --   --   --     Estimated Creatinine Clearance: 64.2 mL/min (by C-G formula based on SCr of 0.73 mg/dL).    Allergies  Allergen Reactions  . Nsaids Other (See Comments)    Causes GI bleeding  . Tramadol Hcl     Per Daughter- patient has " sleep paralysis"    Antimicrobials this admission: Vancomycin 4/24 >> 4/26 Zosyn 4/24 >>  Levaquin 4/25>>   Microbiology results: 4/24 BC x2: NG x2 days 4/24 MRSA PCR: neg 4/24 SARS Coronavirus 2 : negative 4/24 Resp PCR: neg   Thank you for allowing pharmacy to be a part of this patient's care.  Despina Pole 07/30/2018 9:10 AM

## 2018-07-30 NOTE — Progress Notes (Addendum)
Subjective: She says she feels a little better.  She is less short of breath.  She has been coughing up some sputum.  Her cough has improved.  Objective: Vital signs in last 24 hours: Temp:  [97.9 F (36.6 C)-98.1 F (36.7 C)] 98.1 F (36.7 C) (04/27 0534) Pulse Rate:  [82-120] 109 (04/27 0534) Resp:  [18-20] 20 (04/27 0534) BP: (130-137)/(81-90) 130/90 (04/27 0534) SpO2:  [92 %-99 %] 97 % (04/27 0737) Weight change:  Last BM Date: 07/28/18  Intake/Output from previous day: 04/26 0701 - 04/27 0700 In: 720 [P.O.:720] Out: 4600 [Urine:4600]  PHYSICAL EXAM General appearance: alert, cooperative and mild distress Resp: rales bilaterally Cardio: regular rate and rhythm, S1, S2 normal, no murmur, click, rub or gallop GI: soft, non-tender; bowel sounds normal; no masses,  no organomegaly Extremities: extremities normal, atraumatic, no cyanosis or edema  Lab Results:  Results for orders placed or performed during the hospital encounter of 07/27/18 (from the past 48 hour(s))  Basic metabolic panel     Status: Abnormal   Collection Time: 07/29/18  6:27 AM  Result Value Ref Range   Sodium 136 135 - 145 mmol/L   Potassium 5.1 3.5 - 5.1 mmol/L    Comment: DELTA CHECK NOTED NO VISIBLE HEMOLYSIS    Chloride 101 98 - 111 mmol/L   CO2 27 22 - 32 mmol/L   Glucose, Bld 140 (H) 70 - 99 mg/dL   BUN 6 6 - 20 mg/dL   Creatinine, Ser 0.51 0.44 - 1.00 mg/dL   Calcium 9.4 8.9 - 10.3 mg/dL   GFR calc non Af Amer >60 >60 mL/min   GFR calc Af Amer >60 >60 mL/min   Anion gap 8 5 - 15    Comment: Performed at [facility], [address], [place], [place] [address]  CBC     Status: Abnormal   Collection Time: 07/29/18  6:27 AM  Result Value Ref Range   WBC 6.9 4.0 - 10.5 K/uL   RBC 3.69 (L) 3.87 - 5.11 MIL/uL   Hemoglobin 11.4 (L) 12.0 - 15.0 g/dL   HCT 36.4 36.0 - 46.0 %   MCV 98.6 80.0 - 100.0 fL   MCH 30.9 26.0 - 34.0 pg   MCHC 31.3 30.0 - 36.0 g/dL   RDW 16.5 (H) 11.5 - 15.5 %   Platelets 529 (H) 150 - 400 K/uL   nRBC 0.0 0.0 - 0.2 %    Comment: Performed at [facility], [address], [place], [place] [address]  Basic metabolic panel     Status: Abnormal   Collection Time: 07/30/18  4:27 AM  Result Value Ref Range   Sodium 137 135 - 145 mmol/L   Potassium 4.8 3.5 - 5.1 mmol/L   Chloride 98 98 - 111 mmol/L   CO2 29 22 - 32 mmol/L   Glucose, Bld 127 (H) 70 - 99 mg/dL   BUN 8 6 - 20 mg/dL   Creatinine, Ser 0.73 0.44 - 1.00 mg/dL   Calcium 9.7 8.9 - 10.3 mg/dL   GFR calc non Af Amer >60 >60 mL/min   GFR calc Af Amer >60 >60 mL/min   Anion gap 10 5 - 15    Comment: Performed at [facility], [address], [place], [place] [address]  CBC     Status: Abnormal   Collection Time: 07/30/18  4:27 AM  Result Value Ref Range   WBC 12.3 (H) 4.0 - 10.5 K/uL   RBC 3.71 (L) 3.87 -  5.11 MIL/uL   Hemoglobin 11.5 (L) 12.0 - 15.0 g/dL   HCT 36.8 36.0 - 46.0 %   MCV 99.2 80.0 - 100.0 fL   MCH 31.0 26.0 - 34.0 pg   MCHC 31.3 30.0 - 36.0 g/dL   RDW 16.6 (H) 11.5 - 15.5 %   Platelets 669 (H) 150 - 400 K/uL   nRBC 0.0 0.0 - 0.2 %    Comment: Performed at [facility], [address] [place], [place] [address]    ABGS No results for input(s): PHART, PO2ART, TCO2, HCO3 in the last 72 hours.  Invalid input(s): PCO2 CULTURES Recent Results (from the past 240 hour(s))  SARS Coronavirus 2 [facility] order, Performed in [facility] hospital lab)     Status: None   Collection Time: 07/27/18 11:36 AM  Result Value Ref Range Status   SARS Coronavirus 2 NEGATIVE NEGATIVE Final    Comment: (NOTE) If result is NEGATIVE SARS-CoV-2 target nucleic acids are NOT DETECTED. The SARS-CoV-2 RNA is generally detectable in upper and lower  respiratory specimens during the acute phase of infection. The lowest  concentration of SARS-CoV-2 viral copies this assay can detect is 250  copies / mL. A negative result does not preclude SARS-CoV-2 infection  and should not be used as the sole  basis for treatment or other  patient management decisions.  A negative result may occur with  improper specimen collection / handling, submission of specimen other  than nasopharyngeal swab, presence of viral mutation(s) within the  areas targeted by this assay, and inadequate number of viral copies  (<250 copies / mL). A negative result must be combined with clinical  observations, patient history, and epidemiological information. If result is POSITIVE SARS-CoV-2 target nucleic acids are DETECTED. The SARS-CoV-2 RNA is generally detectable in upper and lower  respiratory specimens dur ing the acute phase of infection.  Positive  results are indicative of active infection with SARS-CoV-2.  Clinical  correlation with patient history and other diagnostic information is  necessary to determine patient infection status.  Positive results do  not rule out bacterial infection or co-infection with other viruses. If result is PRESUMPTIVE POSTIVE SARS-CoV-2 nucleic acids MAY BE PRESENT.   A presumptive positive result was obtained on the submitted specimen  and confirmed on repeat testing.  While 2019 novel coronavirus  (SARS-CoV-2) nucleic acids may be present in the submitted sample  additional confirmatory testing may be necessary for epidemiological  and / or clinical management purposes  to differentiate between  SARS-CoV-2 and other Sarbecovirus currently known to infect humans.  If clinically indicated additional testing with an alternate test  methodology [phone]) is advised. The SARS-CoV-2 RNA is generally  detectable in upper and lower respiratory sp ecimens during the acute  phase of infection. The expected result is Negative. Fact Sheet for Patients:  StrictlyIdeas.no Fact Sheet for Healthcare Providers: BankingDealers.co.za This test is not yet approved or cleared by the [place] FDA and has been authorized for detection  and/or diagnosis of SARS-CoV-2 by FDA under an Emergency Use Authorization (EUA).  This EUA will remain in effect (meaning this test can be used) for the duration of the COVID-19 declaration under Section 564(b)(1) of the Act, 21 U.S.C. section 360bbb-3(b)(1), unless the authorization is terminated or revoked sooner. Performed at [facility], [address] , [place] [address]   Blood culture (routine x 2)     Status: None (Preliminary result)   Collection Time: 07/27/18 12:09 PM  Result Value  Ref Range Status   Specimen Description   Final    BLOOD RIGHT ARM BOTTLES DRAWN AEROBIC AND ANAEROBIC   Special Requests   Final    Blood Culture results may not be optimal due to an excessive volume of blood received in culture bottles   Culture   Final    NO GROWTH 2 DAYS Performed at [facility], [address] [place], [place] [address]    Report Status PENDING  Incomplete  Blood culture (routine x 2)     Status: None (Preliminary result)   Collection Time: 07/27/18 12:18 PM  Result Value Ref Range Status   Specimen Description BLOOD LEFT ARM BOTTLES DRAWN AEROBIC AND ANAEROBIC  Final   Special Requests Blood Culture adequate volume  Final   Culture   Final    NO GROWTH 2 DAYS Performed at [facility], [address], [place], [place] [address]    Report Status PENDING  Incomplete  Respiratory Panel by PCR     Status: None   Collection Time: 07/27/18  3:14 PM  Result Value Ref Range Status   Adenovirus NOT DETECTED NOT DETECTED Final   Coronavirus 229E NOT DETECTED NOT DETECTED Final    Comment: (NOTE) The Coronavirus on the Respiratory Panel, DOES NOT test for the novel  Coronavirus (2019 nCoV)    Coronavirus HKU1 NOT DETECTED NOT DETECTED Final   Coronavirus NL63 NOT DETECTED NOT DETECTED Final   Coronavirus OC43 NOT DETECTED NOT DETECTED Final   Metapneumovirus NOT DETECTED NOT DETECTED Final   Rhinovirus / Enterovirus NOT DETECTED NOT DETECTED Final   Influenza A  NOT DETECTED NOT DETECTED Final   Influenza B NOT DETECTED NOT DETECTED Final   Parainfluenza Virus 1 NOT DETECTED NOT DETECTED Final   Parainfluenza Virus 2 NOT DETECTED NOT DETECTED Final   Parainfluenza Virus 3 NOT DETECTED NOT DETECTED Final   Parainfluenza Virus 4 NOT DETECTED NOT DETECTED Final   Respiratory Syncytial Virus NOT DETECTED NOT DETECTED Final   Bordetella pertussis NOT DETECTED NOT DETECTED Final   Chlamydophila pneumoniae NOT DETECTED NOT DETECTED Final   Mycoplasma pneumoniae NOT DETECTED NOT DETECTED Final    Comment: Performed at [facility], [place] [address], [place], [place] [address]  MRSA PCR Screening     Status: None   Collection Time: 07/27/18  3:14 PM  Result Value Ref Range Status   MRSA by PCR NEGATIVE NEGATIVE Final    Comment:        The GeneXpert MRSA Assay (FDA approved for NASAL specimens only), is one component of a comprehensive MRSA colonization surveillance program. It is not intended to diagnose MRSA infection nor to guide or monitor treatment for MRSA infections. Performed at [facility], [address] [place],  [address]    Studies/Results: No results found.  Medications:  Prior to Admission:  Medications Prior to Admission  Medication Sig Dispense Refill Last Dose  . acetaminophen (TYLENOL) 500 MG tablet Take 1 tablet (500 mg total) by mouth every 8 (eight) hours as needed for moderate pain. 30 tablet 0 Past Week at Unknown time  . apixaban (ELIQUIS) 5 MG TABS tablet Take 1 tablet (5 mg total) by mouth 2 (two) times daily. 2 pills mouth twice a day till 06/12/2018 then switch to 1 pill twice a day from 06/13/2018 (Patient taking differently: Take 10 mg by mouth 2 (two) times daily. ) 60 tablet 0 07/26/2018 at 0500pm  . cetirizine (ZYRTEC) 10 MG tablet Take 10  mg by mouth daily.   07/27/2018 at Unknown time  . FLUoxetine (PROZAC) 40 MG capsule Take 1 capsule (40 mg total) by mouth daily. For depression and anxiety. 30  capsule 0 07/27/2018 at Unknown time  . guaiFENesin (MUCINEX) 600 MG 12 hr tablet Take 800 mg by mouth 2 (two) times daily.    07/26/2018 at Unknown time  . pantoprazole (PROTONIX) 40 MG tablet Take 1 tablet (40 mg total) by mouth 2 (two) times daily. 60 tablet 0 07/27/2018 at Unknown time  . Probiotic Product (PROBIOTIC PO) Take 1 tablet by mouth daily. Women's probiotic for GI health   07/26/2018 at Unknown time  . divalproex (DEPAKOTE ER) 500 MG 24 hr tablet Take 500 mg by mouth at bedtime. For moods   Not Taking at Unknown time  . lactose free nutrition (BOOST PLUS) LIQD Take 237 mLs by mouth 3 (three) times daily with meals. (Patient not taking: Reported on 07/27/2018) 30 Can 0 Not Taking at Unknown time  . Melatonin 5 MG CAPS Take 20 mg by mouth at bedtime.    Not Taking at Unknown time  . metoprolol tartrate (LOPRESSOR) 25 MG tablet Take 1 tablet (25 mg total) by mouth 2 (two) times daily. (Patient not taking: Reported on 07/27/2018) 60 tablet 0 Not Taking at Unknown time  . ziprasidone (GEODON) 80 MG capsule Take one capsule at bedtime for depression and mood stability. (Patient not taking: Reported on 07/27/2018) 30 capsule 0 Not Taking at Unknown time   Scheduled: . apixaban  5 mg Oral BID  . benzonatate  200 mg Oral TID  . budesonide (PULMICORT) nebulizer solution  0.25 mg Nebulization BID  . divalproex  500 mg Oral QHS  . fentaNYL (SUBLIMAZE) injection  50 mcg Intramuscular Once  . FLUoxetine  40 mg Oral Daily  . guaiFENesin  600 mg Oral BID  . HYDROcodone-acetaminophen  1-2 tablet Oral Q6H  . ipratropium  0.5 mg Nebulization Q6H WA  . levalbuterol  1.25 mg Nebulization Q6H WA  . lidocaine  1 patch Transdermal Q24H  . loratadine  10 mg Oral Daily  . methylPREDNISolone (SOLU-MEDROL) injection  40 mg Intravenous Q12H  . metoprolol tartrate  5 mg Intravenous Once  . pantoprazole  40 mg Oral BID  . sodium chloride flush  3 mL Intravenous Q12H   Continuous: . sodium chloride    .  levofloxacin (LEVAQUIN) IV 500 mg (07/29/18 1020)  . piperacillin-tazobactam (ZOSYN)  IV 3.375 g (07/29/18 2139)   GGE:ZMOQHU chloride, acetaminophen, fentaNYL (SUBLIMAZE) injection, guaiFENesin-dextromethorphan, ondansetron **OR** ondansetron (ZOFRAN) IV, sodium chloride flush  Assesment: She was admitted with acute hypoxic respiratory failure.  This is after a prolonged hospitalization requiring intubation and mechanical ventilation.  She had GI bleeding with a large ulcer at that time.  CT scan in February showed some interstitial changes mostly groundglass and CT done this admission shows significant fibrosis.  She seems to be somewhat better.  She was not felt to have severe aspiration risk by speech therapy. Active Problems:   Acute hypoxemic respiratory failure (HCC)    Plan: Continue IV steroids and oxygen.  She is going to get up in a chair today.  If she continues to improve we will continue current treatments.  If she does not she will need high resolution CT soon which could be delayed if she continues to improve    LOS: 3 days   Christine Ortega 07/30/2018, 8:32 AM

## 2018-07-30 NOTE — Care Management Important Message (Signed)
Important Message  Patient Details  Name: GRACI HULCE MRN: [id number] Date of Birth: Jul 02, 1960   Medicare Important Message Given:  Yes    Tommy Medal 07/30/2018, 1:49 PM

## 2018-07-31 LAB — CBC
HCT: 36.5 % (ref 36.0–46.0)
Hemoglobin: 11.5 g/dL — ABNORMAL LOW (ref 12.0–15.0)
MCH: 31 pg (ref 26.0–34.0)
MCHC: 31.5 g/dL (ref 30.0–36.0)
MCV: 98.4 fL (ref 80.0–100.0)
Platelets: 602 10*3/uL — ABNORMAL HIGH (ref 150–400)
RBC: 3.71 MIL/uL — ABNORMAL LOW (ref 3.87–5.11)
RDW: 16.2 % — ABNORMAL HIGH (ref 11.5–15.5)
WBC: 11.1 10*3/uL — ABNORMAL HIGH (ref 4.0–10.5)
nRBC: 0 % (ref 0.0–0.2)

## 2018-07-31 LAB — BASIC METABOLIC PANEL
Anion gap: 12 (ref 5–15)
BUN: 6 mg/dL (ref 6–20)
CO2: 27 mmol/L (ref 22–32)
Calcium: 9.2 mg/dL (ref 8.9–10.3)
Chloride: 97 mmol/L — ABNORMAL LOW (ref 98–111)
Creatinine, Ser: 0.67 mg/dL (ref 0.44–1.00)
GFR calc Af Amer: >60 mL/min (ref >60)
GFR calc non Af Amer: >60 mL/min (ref >60)
Glucose, Bld: 175 mg/dL — ABNORMAL HIGH (ref 70–99)
Potassium: 3.8 mmol/L (ref 3.5–5.1)
Sodium: 136 mmol/L (ref 135–145)

## 2018-07-31 MED ORDER — FENTANYL CITRATE (PF) 100 MCG/2ML IJ SOLN
25.0000 ug | INTRAMUSCULAR | Status: DC | PRN
  Administered 2018-07-31 – 2018-08-02 (×21): 25 ug via INTRAVENOUS
  Filled 2018-07-31 (×21): qty 2

## 2018-07-31 NOTE — Progress Notes (Signed)
PROGRESS NOTE    Christine Ortega  EQA:[id number] DOB: May 12, 1960 DOA: 07/27/2018 PCP: Lucia Gaskins, MD   Brief Narrative:  Per HPI: Christine L Robertsis a 58 y.o.femalewith medical history significant foranxiety/depression, large-gastric ulcer, prior DVT on Eliquis, bipolar disorder, schizophrenia, and chronic pain who presented to the emergency department with worsening dyspnea and hypoxemia checked by home health nurse with saturations in the 70th percentile. She has had increased work of breathing recently and persistent dry cough with no hemoptysis or chest pain. She denies any sick contacts and has been at home since her last discharge with no recent travel. She denies any fevers or chills and has no significant lower extremity edema.  She was admitted with acute hypoxemic respiratory failure secondary to right-sided pneumonia, but has CT chest findings of interstitial fibrosis and possibly interstitial pneumonia.  Appreciate ongoing pulmonology evaluation and treatment with IV Solu-Medrol as well as Zosyn and Levaquin.  She continues to make slow progress towards improvement, but is still hypoxemic and has significant bronchitis.  Plans are for high-resolution CT in the near future.   Assessment & Plan:   Active Problems:   Acute hypoxemic respiratory failure ([place])   1. Acute hypoxemic respiratory failure secondary to right-sided pneumoniaand possible interstitial process with significant fibrosis noted on CT chest-improving.It appears that patient may have interstitial pneumonia versus COP. Discontinuedvancomycin due to MRSA nares negative. Continue on Zosyn as well as Levaquin added by pulmonology-sed rate low, but still improving on IV steroids. Respiratory panel noted to be negative. COVID-19 testing noted to be negative as well.ContinueIV steroids today and continue with duo nebs as well as Pulmicort. Pain management for chest and back given recurrent cough.  2. Hypokalemia-resolved. Recheck in am.Hold further supplementation. 3. Prior right lower extremity DVT and left upper extremity DVT. Continue on home Eliquis. 4. ADD/bipolar/depression/schizophrenia. Continue home medications. 5. Dysphagia. Continue on dysphagia 3 diet now; appreciate SLP evaluation. 6. Chronic pain.Continueon around-the-clock narcotic pain medication for assistance in pain management with IV narcotics for breakthrough.   DVT prophylaxis:Eliquis Code Status:Full Family Communication:None at bedside Disposition Plan:Appreciate pulmonology recommendations. Continue on current IV antibiotics and monitor ongoing symptomatic improvement.  Anticipate discharge in the next 24 to 48 hours with switch to oral prednisone and antibiotics once sufficiently improved to near baseline and okay with pulmonology.   Consultants:  Pulmonology  Procedures:  None  Antimicrobials:  Zosyn 4/24->  Vancomycin 4/24-4/25  Levaquin 4/25->  Subjective: Patient seen and evaluated today with no new acute complaints or concerns. No acute concerns or events noted overnight.  She continues to have mild hypoxemia and ongoing cough but appears to be slowly improving.  Objective: Vitals:   07/31/18 0155 07/31/18 0528 07/31/18 0721 07/31/18 0726  BP:  140/83    Pulse:  (!) 116    Resp:  16    Temp:  97.9 F (36.6 C)    TempSrc:  Oral    SpO2: 94% 98% 99% 99%  Weight:      Height:        Intake/Output Summary (Last 24 hours) at 07/31/2018 0903 Last data filed at 07/31/2018 0816 Gross per 24 hour  Intake 583 ml  Output 3200 ml  Net -2617 ml   Filed Weights   07/27/18 1102 07/27/18 1627  Weight: 54.4 kg 54.5 kg    Examination:  General exam: Appears calm and comfortable  Respiratory system: Clear to auscultation. Respiratory effort normal.  Currently on nasal cannula oxygen. Cardiovascular system: S1 & S2 heard,  RRR. No JVD, murmurs, rubs, gallops or  clicks. No pedal edema. Gastrointestinal system: Abdomen is nondistended, soft and nontender. No organomegaly or masses felt. Normal bowel sounds heard. Central nervous system: Alert and oriented. No focal neurological deficits. Extremities: Symmetric 5 x 5 power. Skin: No rashes, lesions or ulcers Psychiatry: Judgement and insight appear normal. Mood & affect appropriate.     Data Reviewed: I have personally reviewed following labs and imaging studies  CBC: Recent Labs  Lab 07/27/18 1206 07/28/18 0620 07/29/18 0627 07/30/18 0427 07/31/18 0519  WBC 13.8* 12.6* 6.9 12.3* 11.1*  NEUTROABS 7.7  --   --   --   --   HGB 11.8* 11.0* 11.4* 11.5* 11.5*  HCT 37.0 35.2* 36.4 36.8 36.5  MCV 96.4 98.1 98.6 99.2 98.4  PLT 594* 532* 529* 669* 177*   Basic Metabolic Panel: Recent Labs  Lab 07/27/18 1206 07/27/18 1400 07/28/18 0620 07/29/18 0627 07/30/18 0427 07/31/18 0519  NA 140  --  140 136 137 136  K 2.9*  --  3.8 5.1 4.8 3.8  CL 105  --  108 101 98 97*  CO2 26  --  25 27 29 27   GLUCOSE 114*  --  89 140* 127* 175*  BUN 16  --  6 6 8 6   CREATININE 0.57  --  0.45 0.51 0.73 0.67  CALCIUM 8.8*  --  8.6* 9.4 9.7 9.2  MG  --  1.9 1.7  --   --   --    GFR: Estimated Creatinine Clearance: 64.2 mL/min (by C-G formula based on SCr of 0.67 mg/dL). Liver Function Tests: Recent Labs  Lab 07/27/18 1206  AST 22  ALT 13  ALKPHOS 109  BILITOT 0.2*  PROT 6.2*  ALBUMIN 2.7*   No results for input(s): LIPASE, AMYLASE in the last 168 hours. No results for input(s): AMMONIA in the last 168 hours. Coagulation Profile: No results for input(s): INR, PROTIME in the last 168 hours. Cardiac Enzymes: Recent Labs  Lab 07/27/18 1206  TROPONINI <0.03   BNP (last 3 results) No results for input(s): PROBNP in the last 8760 hours. HbA1C: No results for input(s): HGBA1C in the last 72 hours. CBG: No results for input(s): GLUCAP in the last 168 hours. Lipid Profile: No results for input(s):  CHOL, HDL, LDLCALC, TRIG, CHOLHDL, LDLDIRECT in the last 72 hours. Thyroid Function Tests: No results for input(s): TSH, T4TOTAL, FREET4, T3FREE, THYROIDAB in the last 72 hours. Anemia Panel: No results for input(s): VITAMINB12, FOLATE, FERRITIN, TIBC, IRON, RETICCTPCT in the last 72 hours. Sepsis Labs: Recent Labs  Lab 07/27/18 1210 07/27/18 1400  PROCALCITON  --  5.87  LATICACIDVEN 1.1 0.8    Recent Results (from the past 240 hour(s))  SARS Coronavirus 2 [facility] order, Performed in [place] hospital lab)     Status: None   Collection Time: 07/27/18 11:36 AM  Result Value Ref Range Status   SARS Coronavirus 2 NEGATIVE NEGATIVE Final    Comment: (NOTE) If result is NEGATIVE SARS-CoV-2 target nucleic acids are NOT DETECTED. The SARS-CoV-2 RNA is generally detectable in upper and lower  respiratory specimens during the acute phase of infection. The lowest  concentration of SARS-CoV-2 viral copies this assay can detect is 250  copies / mL. A negative result does not preclude SARS-CoV-2 infection  and should not be used as the sole basis for treatment or other  patient management decisions.  A negative result may occur with  improper specimen collection /  handling, submission of specimen other  than nasopharyngeal swab, presence of viral mutation(s) within the  areas targeted by this assay, and inadequate number of viral copies  (<250 copies / mL). A negative result must be combined with clinical  observations, patient history, and epidemiological information. If result is POSITIVE SARS-CoV-2 target nucleic acids are DETECTED. The SARS-CoV-2 RNA is generally detectable in upper and lower  respiratory specimens dur ing the acute phase of infection.  Positive  results are indicative of active infection with SARS-CoV-2.  Clinical  correlation with patient history and other diagnostic information is  necessary to determine patient infection status.  Positive results do  not rule  out bacterial infection or co-infection with other viruses. If result is PRESUMPTIVE POSTIVE SARS-CoV-2 nucleic acids MAY BE PRESENT.   A presumptive positive result was obtained on the submitted specimen  and confirmed on repeat testing.  While 2019 novel coronavirus  (SARS-CoV-2) nucleic acids may be present in the submitted sample  additional confirmatory testing may be necessary for epidemiological  and / or clinical management purposes  to differentiate between  SARS-CoV-2 and other Sarbecovirus currently known to infect humans.  If clinically indicated additional testing with an alternate test  methodology [phone] is advised. The SARS-CoV-2 RNA is generally  detectable in upper and lower respiratory sp ecimens during the acute  phase of infection. The expected result is Negative. Fact Sheet for Patients:  StrictlyIdeas.no Fact Sheet for Healthcare Providers: BankingDealers.co.za This test is not yet approved or cleared by the [place] FDA and has been authorized for detection and/or diagnosis of SARS-CoV-2 by FDA under an Emergency Use Authorization (EUA).  This EUA will remain in effect (meaning this test can be used) for the duration of the COVID-19 declaration under Section 564(b)(1) of the Act, 21 U.S.C. section 360bbb-3(b)(1), unless the authorization is terminated or revoked sooner. Performed at [facility], [address], [place], [place] [address]   Blood culture (routine x 2)     Status: None (Preliminary result)   Collection Time: 07/27/18 12:09 PM  Result Value Ref Range Status   Specimen Description   Final    BLOOD RIGHT ARM BOTTLES DRAWN AEROBIC AND ANAEROBIC   Special Requests   Final    Blood Culture results may not be optimal due to an excessive volume of blood received in culture bottles   Culture   Final    NO GROWTH 3 DAYS Performed at [facility], [address], [place], [place] [address]    Report  Status PENDING  Incomplete  Blood culture (routine x 2)     Status: None (Preliminary result)   Collection Time: 07/27/18 12:18 PM  Result Value Ref Range Status   Specimen Description BLOOD LEFT ARM BOTTLES DRAWN AEROBIC AND ANAEROBIC  Final   Special Requests Blood Culture adequate volume  Final   Culture   Final    NO GROWTH 3 DAYS Performed at [facility], [address], [place], [place] [address]    Report Status PENDING  Incomplete  Respiratory Panel by PCR     Status: None   Collection Time: 07/27/18  3:14 PM  Result Value Ref Range Status   Adenovirus NOT DETECTED NOT DETECTED Final   Coronavirus 229E NOT DETECTED NOT DETECTED Final    Comment: (NOTE) The Coronavirus on the Respiratory Panel, DOES NOT test for the novel  Coronavirus (2019 nCoV)    Coronavirus HKU1 NOT DETECTED NOT DETECTED Final   Coronavirus NL63 NOT DETECTED  NOT DETECTED Final   Coronavirus OC43 NOT DETECTED NOT DETECTED Final   Metapneumovirus NOT DETECTED NOT DETECTED Final   Rhinovirus / Enterovirus NOT DETECTED NOT DETECTED Final   Influenza A NOT DETECTED NOT DETECTED Final   Influenza B NOT DETECTED NOT DETECTED Final   Parainfluenza Virus 1 NOT DETECTED NOT DETECTED Final   Parainfluenza Virus 2 NOT DETECTED NOT DETECTED Final   Parainfluenza Virus 3 NOT DETECTED NOT DETECTED Final   Parainfluenza Virus 4 NOT DETECTED NOT DETECTED Final   Respiratory Syncytial Virus NOT DETECTED NOT DETECTED Final   Bordetella pertussis NOT DETECTED NOT DETECTED Final   Chlamydophila pneumoniae NOT DETECTED NOT DETECTED Final   Mycoplasma pneumoniae NOT DETECTED NOT DETECTED Final    Comment: Performed at [facility], [place] [address] [place], [place] [address]  MRSA PCR Screening     Status: None   Collection Time: 07/27/18  3:14 PM  Result Value Ref Range Status   MRSA by PCR NEGATIVE NEGATIVE Final    Comment:        The GeneXpert MRSA Assay (FDA approved for NASAL specimens only), is one  component of a comprehensive MRSA colonization surveillance program. It is not intended to diagnose MRSA infection nor to guide or monitor treatment for MRSA infections. Performed at [facility], [address], [place], [place] [address]          Radiology Studies: No results found.      Scheduled Meds: . apixaban  5 mg Oral BID  . benzonatate  200 mg Oral TID  . budesonide (PULMICORT) nebulizer solution  0.25 mg Nebulization BID  . divalproex  500 mg Oral QHS  . fentaNYL (SUBLIMAZE) injection  50 mcg Intramuscular Once  . FLUoxetine  40 mg Oral Daily  . guaiFENesin  600 mg Oral BID  . HYDROcodone-acetaminophen  1-2 tablet Oral Q6H  . ipratropium  0.5 mg Nebulization Q6H WA  . levalbuterol  1.25 mg Nebulization Q6H WA  . lidocaine  1 patch Transdermal Q24H  . loratadine  10 mg Oral Daily  . methylPREDNISolone (SOLU-MEDROL) injection  40 mg Intravenous Q12H  . metoprolol tartrate  5 mg Intravenous Once  . pantoprazole  40 mg Oral BID  . sodium chloride flush  3 mL Intravenous Q12H   Continuous Infusions: . sodium chloride    . levofloxacin (LEVAQUIN) IV 500 mg (07/30/18 1012)  . piperacillin-tazobactam (ZOSYN)  IV 3.375 g (07/31/18 0534)     LOS: 4 days    Time spent: 30 minutes    Lorrine Killilea Darleen Crocker, DO [facility] Pager [phone]  If 7PM-7AM, please contact night-coverage [email or web address] Password [facility] 07/31/2018, 9:03 AM

## 2018-07-31 NOTE — Progress Notes (Signed)
Subjective: She says she feels some better.  She is still coughing mostly nonproductively.  She has no new complaints.  When I came into the room she was off her oxygen and did not appear to be in any acute distress.  Objective: Vital signs in last 24 hours: Temp:  [97.9 F (36.6 C)-98.2 F (36.8 C)] 97.9 F (36.6 C) (04/28 0528) Pulse Rate:  [114-120] 116 (04/28 0528) Resp:  [16-19] 16 (04/28 0528) BP: (127-140)/(83-95) 140/83 (04/28 0528) SpO2:  [94 %-100 %] 99 % (04/28 0726) Weight change:  Last BM Date: 07/28/18  Intake/Output from previous day: 04/27 0701 - 04/28 0700 In: 700 [P.O.:600; IV Piggyback:100] Out: 4700 [Urine:4700]  PHYSICAL EXAM General appearance: alert, cooperative and no distress Resp: rales bilaterally Cardio: regular rate and rhythm, S1, S2 normal, no murmur, click, rub or gallop GI: soft, non-tender; bowel sounds normal; no masses,  no organomegaly Extremities: extremities normal, atraumatic, no cyanosis or edema  Lab Results:  Results for orders placed or performed during the hospital encounter of 07/27/18 (from the past 48 hour(s))  Basic metabolic panel     Status: Abnormal   Collection Time: 07/30/18  4:27 AM  Result Value Ref Range   Sodium 137 135 - 145 mmol/L   Potassium 4.8 3.5 - 5.1 mmol/L   Chloride 98 98 - 111 mmol/L   CO2 29 22 - 32 mmol/L   Glucose, Bld 127 (H) 70 - 99 mg/dL   BUN 8 6 - 20 mg/dL   Creatinine, Ser 0.73 0.44 - 1.00 mg/dL   Calcium 9.7 8.9 - 10.3 mg/dL   GFR calc non Af Amer >60 >60 mL/min   GFR calc Af Amer >60 >60 mL/min   Anion gap 10 5 - 15    Comment: Performed at [facility], [address], [place], [place] [address]  CBC     Status: Abnormal   Collection Time: 07/30/18  4:27 AM  Result Value Ref Range   WBC 12.3 (H) 4.0 - 10.5 K/uL   RBC 3.71 (L) 3.87 - 5.11 MIL/uL   Hemoglobin 11.5 (L) 12.0 - 15.0 g/dL   HCT 36.8 36.0 - 46.0 %   MCV 99.2 80.0 - 100.0 fL   MCH 31.0 26.0 - 34.0 pg   MCHC 31.3 30.0 - 36.0  g/dL   RDW 16.6 (H) 11.5 - 15.5 %   Platelets 669 (H) 150 - 400 K/uL   nRBC 0.0 0.0 - 0.2 %    Comment: Performed at [facility], [address], [place], [place] [address]  CBC     Status: Abnormal   Collection Time: 07/31/18  5:19 AM  Result Value Ref Range   WBC 11.1 (H) 4.0 - 10.5 K/uL   RBC 3.71 (L) 3.87 - 5.11 MIL/uL   Hemoglobin 11.5 (L) 12.0 - 15.0 g/dL   HCT 36.5 36.0 - 46.0 %   MCV 98.4 80.0 - 100.0 fL   MCH 31.0 26.0 - 34.0 pg   MCHC 31.5 30.0 - 36.0 g/dL   RDW 16.2 (H) 11.5 - 15.5 %   Platelets 602 (H) 150 - 400 K/uL   nRBC 0.0 0.0 - 0.2 %    Comment: Performed at [facility], [address] [place], [place] [address]  Basic metabolic panel     Status: Abnormal   Collection Time: 07/31/18  5:19 AM  Result Value Ref Range   Sodium 136 135 - 145 mmol/L   Potassium 3.8 3.5 - 5.1 mmol/L  Comment: DELTA CHECK NOTED   Chloride 97 (L) 98 - 111 mmol/L   CO2 27 22 - 32 mmol/L   Glucose, Bld 175 (H) 70 - 99 mg/dL   BUN 6 6 - 20 mg/dL   Creatinine, Ser 0.67 0.44 - 1.00 mg/dL   Calcium 9.2 8.9 - 10.3 mg/dL   GFR calc non Af Amer >60 >60 mL/min   GFR calc Af Amer >60 >60 mL/min   Anion gap 12 5 - 15    Comment: Performed at [facility], [address] [place], [place] [address]    ABGS No results for input(s): PHART, PO2ART, TCO2, HCO3 in the last 72 hours.  Invalid input(s): PCO2 CULTURES Recent Results (from the past 240 hour(s))  SARS Coronavirus 2 [facility] order, Performed in [facility] hospital lab)     Status: None   Collection Time: 07/27/18 11:36 AM  Result Value Ref Range Status   SARS Coronavirus 2 NEGATIVE NEGATIVE Final    Comment: (NOTE) If result is NEGATIVE SARS-CoV-2 target nucleic acids are NOT DETECTED. The SARS-CoV-2 RNA is generally detectable in upper and lower  respiratory specimens during the acute phase of infection. The lowest  concentration of SARS-CoV-2 viral copies this assay can detect is 250  copies / mL. A negative result does  not preclude SARS-CoV-2 infection  and should not be used as the sole basis for treatment or other  patient management decisions.  A negative result may occur with  improper specimen collection / handling, submission of specimen other  than nasopharyngeal swab, presence of viral mutation(s) within the  areas targeted by this assay, and inadequate number of viral copies  (<250 copies / mL). A negative result must be combined with clinical  observations, patient history, and epidemiological information. If result is POSITIVE SARS-CoV-2 target nucleic acids are DETECTED. The SARS-CoV-2 RNA is generally detectable in upper and lower  respiratory specimens dur ing the acute phase of infection.  Positive  results are indicative of active infection with SARS-CoV-2.  Clinical  correlation with patient history and other diagnostic information is  necessary to determine patient infection status.  Positive results do  not rule out bacterial infection or co-infection with other viruses. If result is PRESUMPTIVE POSTIVE SARS-CoV-2 nucleic acids MAY BE PRESENT.   A presumptive positive result was obtained on the submitted specimen  and confirmed on repeat testing.  While 2019 novel coronavirus  (SARS-CoV-2) nucleic acids may be present in the submitted sample  additional confirmatory testing may be necessary for epidemiological  and / or clinical management purposes  to differentiate between  SARS-CoV-2 and other Sarbecovirus currently known to infect humans.  If clinically indicated additional testing with an alternate test  methodology [phone]) is advised. The SARS-CoV-2 RNA is generally  detectable in upper and lower respiratory sp ecimens during the acute  phase of infection. The expected result is Negative. Fact Sheet for Patients:  StrictlyIdeas.no Fact Sheet for Healthcare Providers: BankingDealers.co.za This test is not yet approved or  cleared by the [place] FDA and has been authorized for detection and/or diagnosis of SARS-CoV-2 by FDA under an Emergency Use Authorization (EUA).  This EUA will remain in effect (meaning this test can be used) for the duration of the COVID-19 declaration under Section 564(b)(1) of the Act, 21 U.S.C. section 360bbb-3(b)(1), unless the authorization is terminated or revoked sooner. Performed at [facility], [address] [place], [place] [address]   Blood culture (routine x 2)  Status: None (Preliminary result)   Collection Time: 07/27/18 12:09 PM  Result Value Ref Range Status   Specimen Description   Final    BLOOD RIGHT ARM BOTTLES DRAWN AEROBIC AND ANAEROBIC   Special Requests   Final    Blood Culture results may not be optimal due to an excessive volume of blood received in culture bottles   Culture   Final    NO GROWTH 3 DAYS Performed at [facility], [address], [place], [place] [address]    Report Status PENDING  Incomplete  Blood culture (routine x 2)     Status: None (Preliminary result)   Collection Time: 07/27/18 12:18 PM  Result Value Ref Range Status   Specimen Description BLOOD LEFT ARM BOTTLES DRAWN AEROBIC AND ANAEROBIC  Final   Special Requests Blood Culture adequate volume  Final   Culture   Final    NO GROWTH 3 DAYS Performed at [facility], [address] [place], [place] [address]    Report Status PENDING  Incomplete  Respiratory Panel by PCR     Status: None   Collection Time: 07/27/18  3:14 PM  Result Value Ref Range Status   Adenovirus NOT DETECTED NOT DETECTED Final   Coronavirus 229E NOT DETECTED NOT DETECTED Final    Comment: (NOTE) The Coronavirus on the Respiratory Panel, DOES NOT test for the novel  Coronavirus (2019 nCoV)    Coronavirus HKU1 NOT DETECTED NOT DETECTED Final   Coronavirus NL63 NOT DETECTED NOT DETECTED Final   Coronavirus OC43 NOT DETECTED NOT DETECTED Final   Metapneumovirus NOT DETECTED NOT DETECTED Final    Rhinovirus / Enterovirus NOT DETECTED NOT DETECTED Final   Influenza A NOT DETECTED NOT DETECTED Final   Influenza B NOT DETECTED NOT DETECTED Final   Parainfluenza Virus 1 NOT DETECTED NOT DETECTED Final   Parainfluenza Virus 2 NOT DETECTED NOT DETECTED Final   Parainfluenza Virus 3 NOT DETECTED NOT DETECTED Final   Parainfluenza Virus 4 NOT DETECTED NOT DETECTED Final   Respiratory Syncytial Virus NOT DETECTED NOT DETECTED Final   Bordetella pertussis NOT DETECTED NOT DETECTED Final   Chlamydophila pneumoniae NOT DETECTED NOT DETECTED Final   Mycoplasma pneumoniae NOT DETECTED NOT DETECTED Final    Comment: Performed at [facility], [place] [address], [place], [place] [address]  MRSA PCR Screening     Status: None   Collection Time: 07/27/18  3:14 PM  Result Value Ref Range Status   MRSA by PCR NEGATIVE NEGATIVE Final    Comment:        The GeneXpert MRSA Assay (FDA approved for NASAL specimens only), is one component of a comprehensive MRSA colonization surveillance program. It is not intended to diagnose MRSA infection nor to guide or monitor treatment for MRSA infections. Performed at [facility], [address], [place], [place] [address]    Studies/Results: No results found.  Medications:  Prior to Admission:  Medications Prior to Admission  Medication Sig Dispense Refill Last Dose  . acetaminophen (TYLENOL) 500 MG tablet Take 1 tablet (500 mg total) by mouth every 8 (eight) hours as needed for moderate pain. 30 tablet 0 Past Week at Unknown time  . apixaban (ELIQUIS) 5 MG TABS tablet Take 1 tablet (5 mg total) by mouth 2 (two) times daily. 2 pills mouth twice a day till 06/12/2018 then switch to 1 pill twice a day from 06/13/2018 (Patient taking differently: Take 10 mg by mouth 2 (two) times daily. ) 60  tablet 0 07/26/2018 at 0500pm  . cetirizine (ZYRTEC) 10 MG tablet Take 10 mg by mouth daily.   07/27/2018 at Unknown time  . FLUoxetine (PROZAC) 40 MG capsule Take 1  capsule (40 mg total) by mouth daily. For depression and anxiety. 30 capsule 0 07/27/2018 at Unknown time  . guaiFENesin (MUCINEX) 600 MG 12 hr tablet Take 800 mg by mouth 2 (two) times daily.    07/26/2018 at Unknown time  . pantoprazole (PROTONIX) 40 MG tablet Take 1 tablet (40 mg total) by mouth 2 (two) times daily. 60 tablet 0 07/27/2018 at Unknown time  . Probiotic Product (PROBIOTIC PO) Take 1 tablet by mouth daily. Women's probiotic for GI health   07/26/2018 at Unknown time  . divalproex (DEPAKOTE ER) 500 MG 24 hr tablet Take 500 mg by mouth at bedtime. For moods   Not Taking at Unknown time  . lactose free nutrition (BOOST PLUS) LIQD Take 237 mLs by mouth 3 (three) times daily with meals. (Patient not taking: Reported on 07/27/2018) 30 Can 0 Not Taking at Unknown time  . Melatonin 5 MG CAPS Take 20 mg by mouth at bedtime.    Not Taking at Unknown time  . metoprolol tartrate (LOPRESSOR) 25 MG tablet Take 1 tablet (25 mg total) by mouth 2 (two) times daily. (Patient not taking: Reported on 07/27/2018) 60 tablet 0 Not Taking at Unknown time  . ziprasidone (GEODON) 80 MG capsule Take one capsule at bedtime for depression and mood stability. (Patient not taking: Reported on 07/27/2018) 30 capsule 0 Not Taking at Unknown time   Scheduled: . apixaban  5 mg Oral BID  . benzonatate  200 mg Oral TID  . budesonide (PULMICORT) nebulizer solution  0.25 mg Nebulization BID  . divalproex  500 mg Oral QHS  . fentaNYL (SUBLIMAZE) injection  50 mcg Intramuscular Once  . FLUoxetine  40 mg Oral Daily  . guaiFENesin  600 mg Oral BID  . HYDROcodone-acetaminophen  1-2 tablet Oral Q6H  . ipratropium  0.5 mg Nebulization Q6H WA  . levalbuterol  1.25 mg Nebulization Q6H WA  . lidocaine  1 patch Transdermal Q24H  . loratadine  10 mg Oral Daily  . methylPREDNISolone (SOLU-MEDROL) injection  40 mg Intravenous Q12H  . metoprolol tartrate  5 mg Intravenous Once  . pantoprazole  40 mg Oral BID  . sodium chloride flush   3 mL Intravenous Q12H   Continuous: . sodium chloride    . levofloxacin (LEVAQUIN) IV 500 mg (07/30/18 1012)  . piperacillin-tazobactam (ZOSYN)  IV 3.375 g (07/31/18 0534)   LPF:XTKWIO chloride, acetaminophen, fentaNYL (SUBLIMAZE) injection, guaiFENesin-dextromethorphan, ondansetron **OR** ondansetron (ZOFRAN) IV, sodium chloride flush  Assesment: She has acute hypoxic respiratory failure which may be superimposed on chronic hypoxic respiratory failure.  She was in the hospital for about 2 weeks with respiratory failure requiring intubation and mechanical ventilation.  She was supposed to go home on oxygen but somehow that did not happen.  She has become more short of breath and came to the emergency department here and was admitted.  CT chest shows markedly increased interstitial pulmonary disease from her CT done about a month ago.  She does seem to be responding to Levaquin and steroids.  She is still hypoxic.  She is still coughing. Active Problems:   Acute hypoxemic respiratory failure (HCC)    Plan: Continue steroids and antibiotics.  As long as she continues to improve we will continue that course with plans to do a high-resolution  CT in the near future.    LOS: 4 days   Alonza Bogus 07/31/2018, 8:38 AM

## 2018-07-31 NOTE — TOC Initial Note (Signed)
Transition of Care [facility]) - Initial/Assessment Note    Patient Details  Name: Christine Ortega MRN: [id number] Date of Birth: [birth year]  [facility] CM/SW Contact:    Boneta Lucks, RN Phone Number: 07/31/2018, 2:40 PM  Clinical Narrative:    Patient was admitted for hypoxemic respiratory failure. Pt continues to be on 3L of 02. Pt lives at home with spouse and daughter April. Patient gave CM permission to speak with daughter. Per patient she was getting in home PT and her daughter had talked to someone on Friday to set up 02 at home. Patient is unsure what agency. Left a message for April to find out more information. CM was able to find that patient is active with [facility] getting PT, RN.  Patient has WC,Walker, bedside commode and shower chair at home and states needs the assistance of her family for ADL's.             Barriers - Needing 02 for Home.    Expected Discharge Plan: [place] Barriers to Discharge: Equipment Delay   Patient Goals and CMS Choice Patient states their goals for this hospitalization and ongoing recovery are:: Pt wishes to return home with Home health, Pt will need PT and 02      Expected Discharge Plan and Services Expected Discharge Plan: [place]   Discharge Planning Services: CM Consult Post Acute Care Choice: [place] arrangements for the past 2 months: Single Family Home                                      Prior Living Arrangements/Services Living arrangements for the past 2 months: Single Family Home Lives with:: Adult Children, Spouse Patient language and need for interpreter reviewed:: No Do you feel safe going back to the place where you live?: Yes([place] and 02)   Pt will need 02 for discharge  Need for Family Participation in Patient Care: Yes (Comment) Care giver support system in place?: Yes (comment) Current home services: Home PT Criminal  Activity/Legal Involvement Pertinent to Current Situation/Hospitalization: No - Comment as needed  Activities of Daily Living Home Assistive Devices/Equipment: Environmental consultant (specify type), Bedside commode/3-in-1, Shower chair with back, Wheelchair ADL Screening (condition at time of admission) Patient's cognitive ability adequate to safely complete daily activities?: No Is the patient deaf or have difficulty hearing?: No Does the patient have difficulty seeing, even when wearing glasses/contacts?: No Does the patient have difficulty concentrating, remembering, or making decisions?: No Patient able to express need for assistance with ADLs?: Yes Does the patient have difficulty dressing or bathing?: Yes Independently performs ADLs?: No Communication: Independent Dressing (OT): Needs assistance Is this a change from baseline?: Pre-admission baseline Grooming: Needs assistance Feeding: Independent Bathing: Needs assistance Is this a change from baseline?: Pre-admission baseline Toileting: Needs assistance Is this a change from baseline?: Pre-admission baseline In/Out Bed: Independent Walks in Home: Independent with device (comment) Does the patient have difficulty walking or climbing stairs?: Yes Weakness of Legs: Both Weakness of Arms/Hands: Both  Permission Sought/Granted Permission sought to share information with : Case Manager, Family Supports Permission granted to share information with : Yes, Verbal Permission Granted  Share Information with NAME: April - daughter           Emotional Assessment Appearance:: Appears older than stated age   Affect (typically observed): Accepting, Calm,  Pleasant, Adaptable Orientation: : Oriented to Self, Oriented to  Time, Oriented to Place, Oriented to Situation Alcohol / Substance Use: Not Applicable Psych Involvement: No (comment)  Admission diagnosis:  Hypoxia [R09.02] HCAP (healthcare-associated pneumonia) [J18.9] Patient Active Problem  List   Diagnosis Date Noted  . Acute hypoxemic respiratory failure ([place]) 07/27/2018  . Bowel perforation ([place])   . Acute respiratory failure ([place])   . Upper GI bleed   . Sepsis due to undetermined organism ([place]) 05/19/2018  . Acute blood loss anemia 05/19/2018  . Community acquired pneumonia 05/19/2018  . Thrombocytosis ([place]) 05/19/2018  . Hypokalemia   . Schizophrenia, paranoid type ([place]) 08/08/2011  . Narcotic abuse in remission ([place])   . Bipolar disorder ([place])   . Chronic pain   . ADD (attention deficit disorder)    PCP:  Lucia Gaskins, MD Pharmacy:   [facility] [place] [address] [place] [address] Phone: [phone] Fax: [phone]  [facility] [place] [place] - [address] [place] [address] Phone: [phone] Fax: [phone]      Readmission Risk Interventions No flowsheet data found.

## 2018-08-01 ENCOUNTER — Inpatient Hospital Stay (HOSPITAL_COMMUNITY): Payer: Medicare Other

## 2018-08-01 DIAGNOSIS — J189 Pneumonia, unspecified organism: Secondary | ICD-10-CM

## 2018-08-01 DIAGNOSIS — R000 Tachycardia, unspecified: Secondary | ICD-10-CM

## 2018-08-01 LAB — BASIC METABOLIC PANEL
Anion gap: 10 (ref 5–15)
BUN: 5 mg/dL — ABNORMAL LOW (ref 6–20)
CO2: 29 mmol/L (ref 22–32)
Calcium: 9.2 mg/dL (ref 8.9–10.3)
Chloride: 99 mmol/L (ref 98–111)
Creatinine, Ser: 0.53 mg/dL (ref 0.44–1.00)
GFR calc Af Amer: >60 mL/min (ref >60)
GFR calc non Af Amer: >60 mL/min (ref >60)
Glucose, Bld: 142 mg/dL — ABNORMAL HIGH (ref 70–99)
Potassium: 3.7 mmol/L (ref 3.5–5.1)
Sodium: 138 mmol/L (ref 135–145)

## 2018-08-01 LAB — CBC
HCT: 35.2 % — ABNORMAL LOW (ref 36.0–46.0)
Hemoglobin: 10.9 g/dL — ABNORMAL LOW (ref 12.0–15.0)
MCH: 30.5 pg (ref 26.0–34.0)
MCHC: 31 g/dL (ref 30.0–36.0)
MCV: 98.6 fL (ref 80.0–100.0)
Platelets: 596 10*3/uL — ABNORMAL HIGH (ref 150–400)
RBC: 3.57 MIL/uL — ABNORMAL LOW (ref 3.87–5.11)
RDW: 15.8 % — ABNORMAL HIGH (ref 11.5–15.5)
WBC: 9.9 10*3/uL (ref 4.0–10.5)
nRBC: 0 % (ref 0.0–0.2)

## 2018-08-01 LAB — CULTURE, BLOOD (ROUTINE X 2)
Culture: NO GROWTH
Culture: NO GROWTH
Special Requests: ADEQUATE

## 2018-08-01 MED ORDER — LORAZEPAM 2 MG/ML IJ SOLN
1.0000 mg | Freq: Once | INTRAMUSCULAR | Status: DC
  Filled 2018-08-01: qty 1

## 2018-08-01 MED ORDER — HALOPERIDOL LACTATE 5 MG/ML IJ SOLN: 2.5000 mg | Freq: Once | INTRAMUSCULAR | Status: DC

## 2018-08-01 NOTE — Plan of Care (Signed)
Patient is progressing with care plan. Complaint of 8/10 pain in ribs and back. PRN med administered. Patient having frequent bouts of non-productive coughing. Patient is ST on telemetry.

## 2018-08-01 NOTE — Progress Notes (Signed)
Subjective: She does not seem to feel as well today.  She still having cough and congestion.  She is short of breath but that does not seem to be quite as bad.    Objective: Vital signs in last 24 hours: Temp:  [97.6 F (36.4 C)-98 F (36.7 C)] 97.6 F (36.4 C) (04/29 0529) Pulse Rate:  [108-115] 110 (04/29 0529) Resp:  [18-19] 18 (04/29 0529) BP: (117-145)/(74-88) 145/88 (04/29 0529) SpO2:  [92 %-100 %] 92 % (04/29 0734) Weight change:  Last BM Date: 07/28/18  Intake/Output from previous day: 04/28 0701 - 04/29 0700 In: 1876.3 [P.O.:1680; I.V.:3; IV Piggyback:193.3] Out: 3300 [Urine:2800; Stool:500]  PHYSICAL EXAM General appearance: alert, cooperative and mild distress Resp: rhonchi bilaterally Cardio: regular rate and rhythm, S1, S2 normal, no murmur, click, rub or gallop GI: soft, non-tender; bowel sounds normal; no masses,  no organomegaly Extremities: extremities normal, atraumatic, no cyanosis or edema  Lab Results:  Results for orders placed or performed during the hospital encounter of 07/27/18 (from the past 48 hour(s))  CBC     Status: Abnormal   Collection Time: 07/31/18  5:19 AM  Result Value Ref Range   WBC 11.1 (H) 4.0 - 10.5 K/uL   RBC 3.71 (L) 3.87 - 5.11 MIL/uL   Hemoglobin 11.5 (L) 12.0 - 15.0 g/dL   HCT 36.5 36.0 - 46.0 %   MCV 98.4 80.0 - 100.0 fL   MCH 31.0 26.0 - 34.0 pg   MCHC 31.5 30.0 - 36.0 g/dL   RDW 16.2 (H) 11.5 - 15.5 %   Platelets 602 (H) 150 - 400 K/uL   nRBC 0.0 0.0 - 0.2 %    Comment: Performed at [facility], [address] [place], [place] [address]  Basic metabolic panel     Status: Abnormal   Collection Time: 07/31/18  5:19 AM  Result Value Ref Range   Sodium 136 135 - 145 mmol/L   Potassium 3.8 3.5 - 5.1 mmol/L    Comment: DELTA CHECK NOTED   Chloride 97 (L) 98 - 111 mmol/L   CO2 27 22 - 32 mmol/L   Glucose, Bld 175 (H) 70 - 99 mg/dL   BUN 6 6 - 20 mg/dL   Creatinine, Ser 0.67 0.44 - 1.00 mg/dL   Calcium 9.2 8.9 - 10.3  mg/dL   GFR calc non Af Amer >60 >60 mL/min   GFR calc Af Amer >60 >60 mL/min   Anion gap 12 5 - 15    Comment: Performed at [facility], [address], [place], [place] [address]  CBC     Status: Abnormal   Collection Time: 08/01/18  4:30 AM  Result Value Ref Range   WBC 9.9 4.0 - 10.5 K/uL   RBC 3.57 (L) 3.87 - 5.11 MIL/uL   Hemoglobin 10.9 (L) 12.0 - 15.0 g/dL   HCT 35.2 (L) 36.0 - 46.0 %   MCV 98.6 80.0 - 100.0 fL   MCH 30.5 26.0 - 34.0 pg   MCHC 31.0 30.0 - 36.0 g/dL   RDW 15.8 (H) 11.5 - 15.5 %   Platelets 596 (H) 150 - 400 K/uL   nRBC 0.0 0.0 - 0.2 %    Comment: Performed at [facility], [address] [place], [place] [address]  Basic metabolic panel     Status: Abnormal   Collection Time: 08/01/18  4:30 AM  Result Value Ref Range   Sodium 138 135 - 145 mmol/L   Potassium 3.7 3.5 - 5.1  mmol/L   Chloride 99 98 - 111 mmol/L   CO2 29 22 - 32 mmol/L   Glucose, Bld 142 (H) 70 - 99 mg/dL   BUN 5 (L) 6 - 20 mg/dL   Creatinine, Ser 0.53 0.44 - 1.00 mg/dL   Calcium 9.2 8.9 - 10.3 mg/dL   GFR calc non Af Amer >60 >60 mL/min   GFR calc Af Amer >60 >60 mL/min   Anion gap 10 5 - 15    Comment: Performed at [facility], [address], [place], [place] [address]    ABGS No results for input(s): PHART, PO2ART, TCO2, HCO3 in the last 72 hours.  Invalid input(s): PCO2 CULTURES Recent Results (from the past 240 hour(s))  SARS Coronavirus 2 [facility] order, Performed in [facility] hospital lab)     Status: None   Collection Time: 07/27/18 11:36 AM  Result Value Ref Range Status   SARS Coronavirus 2 NEGATIVE NEGATIVE Final    Comment: (NOTE) If result is NEGATIVE SARS-CoV-2 target nucleic acids are NOT DETECTED. The SARS-CoV-2 RNA is generally detectable in upper and lower  respiratory specimens during the acute phase of infection. The lowest  concentration of SARS-CoV-2 viral copies this assay can detect is 250  copies / mL. A negative result does not preclude SARS-CoV-2  infection  and should not be used as the sole basis for treatment or other  patient management decisions.  A negative result may occur with  improper specimen collection / handling, submission of specimen other  than nasopharyngeal swab, presence of viral mutation(s) within the  areas targeted by this assay, and inadequate number of viral copies  (<250 copies / mL). A negative result must be combined with clinical  observations, patient history, and epidemiological information. If result is POSITIVE SARS-CoV-2 target nucleic acids are DETECTED. The SARS-CoV-2 RNA is generally detectable in upper and lower  respiratory specimens dur ing the acute phase of infection.  Positive  results are indicative of active infection with SARS-CoV-2.  Clinical  correlation with patient history and other diagnostic information is  necessary to determine patient infection status.  Positive results do  not rule out bacterial infection or co-infection with other viruses. If result is PRESUMPTIVE POSTIVE SARS-CoV-2 nucleic acids MAY BE PRESENT.   A presumptive positive result was obtained on the submitted specimen  and confirmed on repeat testing.  While 2019 novel coronavirus  (SARS-CoV-2) nucleic acids may be present in the submitted sample  additional confirmatory testing may be necessary for epidemiological  and / or clinical management purposes  to differentiate between  SARS-CoV-2 and other Sarbecovirus currently known to infect humans.  If clinically indicated additional testing with an alternate test  methodology [phone]) is advised. The SARS-CoV-2 RNA is generally  detectable in upper and lower respiratory sp ecimens during the acute  phase of infection. The expected result is Negative. Fact Sheet for Patients:  StrictlyIdeas.no Fact Sheet for Healthcare Providers: BankingDealers.co.za This test is not yet approved or cleared by the [place]  FDA and has been authorized for detection and/or diagnosis of SARS-CoV-2 by FDA under an Emergency Use Authorization (EUA).  This EUA will remain in effect (meaning this test can be used) for the duration of the COVID-19 declaration under Section 564(b)(1) of the Act, 21 U.S.C. section 360bbb-3(b)(1), unless the authorization is terminated or revoked sooner. Performed at [facility], [address] [place], [place] [address]   Blood culture (routine x 2)     Status: None  Collection Time: 07/27/18 12:09 PM  Result Value Ref Range Status   Specimen Description   Final    BLOOD RIGHT ARM BOTTLES DRAWN AEROBIC AND ANAEROBIC   Special Requests   Final    Blood Culture results may not be optimal due to an excessive volume of blood received in culture bottles   Culture   Final    NO GROWTH 5 DAYS Performed at [facility], [address], [place], [place] [address]    Report Status 08/01/2018 FINAL  Final  Blood culture (routine x 2)     Status: None   Collection Time: 07/27/18 12:18 PM  Result Value Ref Range Status   Specimen Description BLOOD LEFT ARM BOTTLES DRAWN AEROBIC AND ANAEROBIC  Final   Special Requests Blood Culture adequate volume  Final   Culture   Final    NO GROWTH 5 DAYS Performed at [facility], [address] [place], [place] [address]    Report Status 08/01/2018 FINAL  Final  Respiratory Panel by PCR     Status: None   Collection Time: 07/27/18  3:14 PM  Result Value Ref Range Status   Adenovirus NOT DETECTED NOT DETECTED Final   Coronavirus 229E NOT DETECTED NOT DETECTED Final    Comment: (NOTE) The Coronavirus on the Respiratory Panel, DOES NOT test for the novel  Coronavirus (2019 nCoV)    Coronavirus HKU1 NOT DETECTED NOT DETECTED Final   Coronavirus NL63 NOT DETECTED NOT DETECTED Final   Coronavirus OC43 NOT DETECTED NOT DETECTED Final   Metapneumovirus NOT DETECTED NOT DETECTED Final   Rhinovirus / Enterovirus NOT DETECTED NOT DETECTED Final    Influenza A NOT DETECTED NOT DETECTED Final   Influenza B NOT DETECTED NOT DETECTED Final   Parainfluenza Virus 1 NOT DETECTED NOT DETECTED Final   Parainfluenza Virus 2 NOT DETECTED NOT DETECTED Final   Parainfluenza Virus 3 NOT DETECTED NOT DETECTED Final   Parainfluenza Virus 4 NOT DETECTED NOT DETECTED Final   Respiratory Syncytial Virus NOT DETECTED NOT DETECTED Final   Bordetella pertussis NOT DETECTED NOT DETECTED Final   Chlamydophila pneumoniae NOT DETECTED NOT DETECTED Final   Mycoplasma pneumoniae NOT DETECTED NOT DETECTED Final    Comment: Performed at [facility], [place] [address], [place], [place] [address]  MRSA PCR Screening     Status: None   Collection Time: 07/27/18  3:14 PM  Result Value Ref Range Status   MRSA by PCR NEGATIVE NEGATIVE Final    Comment:        The GeneXpert MRSA Assay (FDA approved for NASAL specimens only), is one component of a comprehensive MRSA colonization surveillance program. It is not intended to diagnose MRSA infection nor to guide or monitor treatment for MRSA infections. Performed at [facility], [address] [place], [place] [address]    Studies/Results: No results found.  Medications:  Prior to Admission:  Medications Prior to Admission  Medication Sig Dispense Refill Last Dose  . acetaminophen (TYLENOL) 500 MG tablet Take 1 tablet (500 mg total) by mouth every 8 (eight) hours as needed for moderate pain. 30 tablet 0 Past Week at Unknown time  . apixaban (ELIQUIS) 5 MG TABS tablet Take 1 tablet (5 mg total) by mouth 2 (two) times daily. 2 pills mouth twice a day till 06/12/2018 then switch to 1 pill twice a day from 06/13/2018 (Patient taking differently: Take 10 mg by mouth 2 (two) times daily. ) 60 tablet 0 07/26/2018 at 0500pm  .  cetirizine (ZYRTEC) 10 MG tablet Take 10 mg by mouth daily.   07/27/2018 at Unknown time  . FLUoxetine (PROZAC) 40 MG capsule Take 1 capsule (40 mg total) by mouth daily. For depression and  anxiety. 30 capsule 0 07/27/2018 at Unknown time  . guaiFENesin (MUCINEX) 600 MG 12 hr tablet Take 800 mg by mouth 2 (two) times daily.    07/26/2018 at Unknown time  . pantoprazole (PROTONIX) 40 MG tablet Take 1 tablet (40 mg total) by mouth 2 (two) times daily. 60 tablet 0 07/27/2018 at Unknown time  . Probiotic Product (PROBIOTIC PO) Take 1 tablet by mouth daily. Women's probiotic for GI health   07/26/2018 at Unknown time  . divalproex (DEPAKOTE ER) 500 MG 24 hr tablet Take 500 mg by mouth at bedtime. For moods   Not Taking at Unknown time  . lactose free nutrition (BOOST PLUS) LIQD Take 237 mLs by mouth 3 (three) times daily with meals. (Patient not taking: Reported on 07/27/2018) 30 Can 0 Not Taking at Unknown time  . Melatonin 5 MG CAPS Take 20 mg by mouth at bedtime.    Not Taking at Unknown time  . metoprolol tartrate (LOPRESSOR) 25 MG tablet Take 1 tablet (25 mg total) by mouth 2 (two) times daily. (Patient not taking: Reported on 07/27/2018) 60 tablet 0 Not Taking at Unknown time  . ziprasidone (GEODON) 80 MG capsule Take one capsule at bedtime for depression and mood stability. (Patient not taking: Reported on 07/27/2018) 30 capsule 0 Not Taking at Unknown time   Scheduled: . apixaban  5 mg Oral BID  . benzonatate  200 mg Oral TID  . budesonide (PULMICORT) nebulizer solution  0.25 mg Nebulization BID  . divalproex  500 mg Oral QHS  . fentaNYL (SUBLIMAZE) injection  50 mcg Intramuscular Once  . FLUoxetine  40 mg Oral Daily  . guaiFENesin  600 mg Oral BID  . HYDROcodone-acetaminophen  1-2 tablet Oral Q6H  . ipratropium  0.5 mg Nebulization Q6H WA  . levalbuterol  1.25 mg Nebulization Q6H WA  . lidocaine  1 patch Transdermal Q24H  . loratadine  10 mg Oral Daily  . methylPREDNISolone (SOLU-MEDROL) injection  40 mg Intravenous Q12H  . metoprolol tartrate  5 mg Intravenous Once  . pantoprazole  40 mg Oral BID  . sodium chloride flush  3 mL Intravenous Q12H   Continuous: . sodium chloride     . levofloxacin (LEVAQUIN) IV 500 mg (07/31/18 1159)  . piperacillin-tazobactam (ZOSYN)  IV 3.375 g (08/01/18 0998)   PJA:SNKNLZ chloride, acetaminophen, fentaNYL (SUBLIMAZE) injection, guaiFENesin-dextromethorphan, ondansetron **OR** ondansetron (ZOFRAN) IV, sodium chloride flush  Assesment: She was admitted with acute hypoxic respiratory failure.  It turns out this is probably acute on chronic as she was supposed to go home from the hospital on oxygen but somehow that did not happen.  She had just been in the hospital in the last 2 months with a prolonged stay requiring ventilator support after GI bleeding.  At that time she had CT that showed some groundglass opacity but CT now shows marked progression of pulmonary fibrotic changes.  She is on steroids and Levaquin for possible chronic organizing pneumonia versus an interstitial pneumonia.  She has been improving but seems a little bit at a standstill today.  Cough is her biggest complaint. Active Problems:   Acute hypoxemic respiratory failure (HCC)    Plan: Check chest x-ray today.  Continue other treatments.    LOS: 5 days   Percell Miller  L Feliza Diven 08/01/2018, 8:47 AM

## 2018-08-01 NOTE — Progress Notes (Signed)
PROGRESS NOTE    Christine Ortega  HYW:[id number]  DOB: [birth year]  DOA: 07/27/2018 PCP: Lucia Gaskins, MD   Brief Admission Hx: 58 y/o female admitted with exacerbation of chronic respiratory failure and right sided pneumonia.    MDM/Assessment & Plan:   1. Acute respiratory failure secondary to pneumonia - Pt is improving with current treatments.  Pt is being followed by pulmonologist.  Pt has interstitial process seen on CT with fibrotic changes.  Continue current treatment plan.  COVID testing has been negative.  2. Chronic anticoagulation s/p RLE DVT - continue apixaban for full anticoagulation.  3. Hypokalemia - repleted.  4. Bipolar / schizophrenia / ADD- she has been resumed on home medications.  5. Dysphagia - continue dysphagia 3 diet.   6. Chronic pain - opioid dependence - continue pain medication regimen, try to wean as able.  7. Sinus tachycardia - patient off her metoprolol, will restart as BP tolerates.    DVT prophylaxis: apixaban  Code Status: Full  Family Communication: patient updated bedside  Disposition Plan: inpatient for IV antibiotics, subspecialty care   Consultants:  Pulmonology   Procedures:    Antimicrobials:  Zosyn 4/24 >  Vancomycin 4/24-4/25  Levofloxacin 4/25 >  Subjective: Pt complains of cough, chest congestion and chest wall pain.  She denies SOB.   Objective: Vitals:   08/01/18 0730 08/01/18 0734 08/01/18 1332 08/01/18 1452  BP:   133/90   Pulse:   (!) 110   Resp:   19   Temp:   97.8 F (36.6 C)   TempSrc:      SpO2: 92% 92% 95% 94%  Weight:      Height:        Intake/Output Summary (Last 24 hours) at 08/01/2018 1531 Last data filed at 08/01/2018 1516 Gross per 24 hour  Intake 2713.31 ml  Output 3350 ml  Net -636.69 ml   Filed Weights   07/27/18 1102 07/27/18 1627  Weight: 54.4 kg 54.5 kg     REVIEW OF SYSTEMS  As per history otherwise all reviewed and reported negative  Exam:  General exam: thin  female awake alert, NAD.  Respiratory system: rales RLL. No increased work of breathing. Cardiovascular system: S1 & S2 heard. Tachycardic rate.  No JVD, murmurs, gallops, clicks or pedal edema. Gastrointestinal system: Abdomen is nondistended, soft and nontender. Normal bowel sounds heard. Central nervous system: Alert and oriented. No focal neurological deficits. Extremities: no CCE.  Data Reviewed: Basic Metabolic Panel: Recent Labs  Lab 07/27/18 1400 07/28/18 0620 07/29/18 0627 07/30/18 0427 07/31/18 0519 08/01/18 0430  NA  --  140 136 137 136 138  K  --  3.8 5.1 4.8 3.8 3.7  CL  --  108 101 98 97* 99  CO2  --  25 27 29 27 29   GLUCOSE  --  89 140* 127* 175* 142*  BUN  --  6 6 8 6  5*  CREATININE  --  0.45 0.51 0.73 0.67 0.53  CALCIUM  --  8.6* 9.4 9.7 9.2 9.2  MG 1.9 1.7  --   --   --   --    Liver Function Tests: Recent Labs  Lab 07/27/18 1206  AST 22  ALT 13  ALKPHOS 109  BILITOT 0.2*  PROT 6.2*  ALBUMIN 2.7*   No results for input(s): LIPASE, AMYLASE in the last 168 hours. No results for input(s): AMMONIA in the last 168 hours. CBC: Recent Labs  Lab 07/27/18 1206 07/28/18 4854  07/29/18 0627 07/30/18 0427 07/31/18 0519 08/01/18 0430  WBC 13.8* 12.6* 6.9 12.3* 11.1* 9.9  NEUTROABS 7.7  --   --   --   --   --   HGB 11.8* 11.0* 11.4* 11.5* 11.5* 10.9*  HCT 37.0 35.2* 36.4 36.8 36.5 35.2*  MCV 96.4 98.1 98.6 99.2 98.4 98.6  PLT 594* 532* 529* 669* 602* 596*   Cardiac Enzymes: Recent Labs  Lab 07/27/18 1206  TROPONINI <0.03   CBG (last 3)  No results for input(s): GLUCAP in the last 72 hours. Recent Results (from the past 240 hour(s))  SARS Coronavirus 2 [facility] order, Performed in [place] hospital lab)     Status: None   Collection Time: 07/27/18 11:36 AM  Result Value Ref Range Status   SARS Coronavirus 2 NEGATIVE NEGATIVE Final    Comment: (NOTE) If result is NEGATIVE SARS-CoV-2 target nucleic acids are NOT DETECTED. The SARS-CoV-2 RNA  is generally detectable in upper and lower  respiratory specimens during the acute phase of infection. The lowest  concentration of SARS-CoV-2 viral copies this assay can detect is 250  copies / mL. A negative result does not preclude SARS-CoV-2 infection  and should not be used as the sole basis for treatment or other  patient management decisions.  A negative result may occur with  improper specimen collection / handling, submission of specimen other  than nasopharyngeal swab, presence of viral mutation(s) within the  areas targeted by this assay, and inadequate number of viral copies  (<250 copies / mL). A negative result must be combined with clinical  observations, patient history, and epidemiological information. If result is POSITIVE SARS-CoV-2 target nucleic acids are DETECTED. The SARS-CoV-2 RNA is generally detectable in upper and lower  respiratory specimens dur ing the acute phase of infection.  Positive  results are indicative of active infection with SARS-CoV-2.  Clinical  correlation with patient history and other diagnostic information is  necessary to determine patient infection status.  Positive results do  not rule out bacterial infection or co-infection with other viruses. If result is PRESUMPTIVE POSTIVE SARS-CoV-2 nucleic acids MAY BE PRESENT.   A presumptive positive result was obtained on the submitted specimen  and confirmed on repeat testing.  While 2019 novel coronavirus  (SARS-CoV-2) nucleic acids may be present in the submitted sample  additional confirmatory testing may be necessary for epidemiological  and / or clinical management purposes  to differentiate between  SARS-CoV-2 and other Sarbecovirus currently known to infect humans.  If clinically indicated additional testing with an alternate test  methodology [phone]) is advised. The SARS-CoV-2 RNA is generally  detectable in upper and lower respiratory sp ecimens during the acute  phase of  infection. The expected result is Negative. Fact Sheet for Patients:  StrictlyIdeas.no Fact Sheet for Healthcare Providers: BankingDealers.co.za This test is not yet approved or cleared by the [place] FDA and has been authorized for detection and/or diagnosis of SARS-CoV-2 by FDA under an Emergency Use Authorization (EUA).  This EUA will remain in effect (meaning this test can be used) for the duration of the COVID-19 declaration under Section 564(b)(1) of the Act, 21 U.S.C. section 360bbb-3(b)(1), unless the authorization is terminated or revoked sooner. Performed at [facility], [address], [place], [place] [address]   Blood culture (routine x 2)     Status: None   Collection Time: 07/27/18 12:09 PM  Result Value Ref Range Status   Specimen Description   Final  BLOOD RIGHT ARM BOTTLES DRAWN AEROBIC AND ANAEROBIC   Special Requests   Final    Blood Culture results may not be optimal due to an excessive volume of blood received in culture bottles   Culture   Final    NO GROWTH 5 DAYS Performed at [facility], [address], [place], [place] [address]    Report Status 08/01/2018 FINAL  Final  Blood culture (routine x 2)     Status: None   Collection Time: 07/27/18 12:18 PM  Result Value Ref Range Status   Specimen Description BLOOD LEFT ARM BOTTLES DRAWN AEROBIC AND ANAEROBIC  Final   Special Requests Blood Culture adequate volume  Final   Culture   Final    NO GROWTH 5 DAYS Performed at [facility], [address] [place], [place] [address]    Report Status 08/01/2018 FINAL  Final  Respiratory Panel by PCR     Status: None   Collection Time: 07/27/18  3:14 PM  Result Value Ref Range Status   Adenovirus NOT DETECTED NOT DETECTED Final   Coronavirus 229E NOT DETECTED NOT DETECTED Final    Comment: (NOTE) The Coronavirus on the Respiratory Panel, DOES NOT test for the novel  Coronavirus (2019 nCoV)    Coronavirus  HKU1 NOT DETECTED NOT DETECTED Final   Coronavirus NL63 NOT DETECTED NOT DETECTED Final   Coronavirus OC43 NOT DETECTED NOT DETECTED Final   Metapneumovirus NOT DETECTED NOT DETECTED Final   Rhinovirus / Enterovirus NOT DETECTED NOT DETECTED Final   Influenza A NOT DETECTED NOT DETECTED Final   Influenza B NOT DETECTED NOT DETECTED Final   Parainfluenza Virus 1 NOT DETECTED NOT DETECTED Final   Parainfluenza Virus 2 NOT DETECTED NOT DETECTED Final   Parainfluenza Virus 3 NOT DETECTED NOT DETECTED Final   Parainfluenza Virus 4 NOT DETECTED NOT DETECTED Final   Respiratory Syncytial Virus NOT DETECTED NOT DETECTED Final   Bordetella pertussis NOT DETECTED NOT DETECTED Final   Chlamydophila pneumoniae NOT DETECTED NOT DETECTED Final   Mycoplasma pneumoniae NOT DETECTED NOT DETECTED Final    Comment: Performed at [facility], [place] [address], [place], [place] [address]  MRSA PCR Screening     Status: None   Collection Time: 07/27/18  3:14 PM  Result Value Ref Range Status   MRSA by PCR NEGATIVE NEGATIVE Final    Comment:        The GeneXpert MRSA Assay (FDA approved for NASAL specimens only), is one component of a comprehensive MRSA colonization surveillance program. It is not intended to diagnose MRSA infection nor to guide or monitor treatment for MRSA infections. Performed at [facility], [address], [place], [place] [address]      Studies: Dg Chest 2 View  Result Date: 08/01/2018 CLINICAL DATA:  Respiratory failure EXAM: CHEST - 2 VIEW COMPARISON:  05/30/2018 FINDINGS: Diffuse bilateral chronic interstitial lung disease with fibrosis. There is no focal consolidation, pleural effusion or pneumothorax. The heart mediastinum are stable. There is no acute osseous abnormality. IMPRESSION: Chronic interstitial lung disease. Mild superimposed interstitial infection or edema cannot be completely excluded. Electronically Signed   By: Kathreen Devoid   On: 08/01/2018 10:29      Scheduled Meds:  apixaban  5 mg Oral BID   benzonatate  200 mg Oral TID   budesonide (PULMICORT) nebulizer solution  0.25 mg Nebulization BID   divalproex  500 mg Oral QHS   fentaNYL (SUBLIMAZE) injection  50 mcg Intramuscular Once  FLUoxetine  40 mg Oral Daily   guaiFENesin  600 mg Oral BID   HYDROcodone-acetaminophen  1-2 tablet Oral Q6H   ipratropium  0.5 mg Nebulization Q6H WA   levalbuterol  1.25 mg Nebulization Q6H WA   lidocaine  1 patch Transdermal Q24H   loratadine  10 mg Oral Daily   methylPREDNISolone (SOLU-MEDROL) injection  40 mg Intravenous Q12H   metoprolol tartrate  5 mg Intravenous Once   pantoprazole  40 mg Oral BID   sodium chloride flush  3 mL Intravenous Q12H   Continuous Infusions:  sodium chloride     levofloxacin (LEVAQUIN) IV 500 mg (08/01/18 1100)   piperacillin-tazobactam (ZOSYN)  IV 3.375 g (08/01/18 1301)    Active Problems:   Acute hypoxemic respiratory failure ([place])   Time spent:   Irwin Brakeman, MD [facility] 08/01/2018, 3:31 PM    LOS: 5 days  How to contact the [facility] Attending or Consulting provider [place] or covering provider during after hours [place], for this patient?  1. Check the care team in [facility] and look for a) attending/consulting TR[facility] provider listed and b) the [facility] team listed 2. Log into [email or web address] and use [place]'s universal password to access. If you do not have the password, please contact the hospital operator. 3. Locate the [facility] provider you are looking for under [facility] and page to a number that you can be directly reached. 4. If you still have difficulty reaching the provider, please page the [facility] (Director on Call) for the Hospitalists listed on amion for assistance.

## 2018-08-02 DIAGNOSIS — A419 Sepsis, unspecified organism: Secondary | ICD-10-CM

## 2018-08-02 DIAGNOSIS — F200 Paranoid schizophrenia: Secondary | ICD-10-CM

## 2018-08-02 DIAGNOSIS — G894 Chronic pain syndrome: Secondary | ICD-10-CM

## 2018-08-02 DIAGNOSIS — F1111 Opioid abuse, in remission: Secondary | ICD-10-CM

## 2018-08-02 DIAGNOSIS — E876 Hypokalemia: Secondary | ICD-10-CM

## 2018-08-02 MED ORDER — HYDROCODONE-ACETAMINOPHEN 5-325 MG PO TABS
1.0000 | ORAL_TABLET | Freq: Four times a day (QID) | ORAL | Status: DC | PRN
  Administered 2018-08-02: 1 via ORAL
  Filled 2018-08-02: qty 1

## 2018-08-02 MED ORDER — FENTANYL CITRATE (PF) 100 MCG/2ML IJ SOLN
12.5000 ug | INTRAMUSCULAR | Status: DC | PRN
  Administered 2018-08-02 (×2): 12.5 ug via INTRAVENOUS
  Filled 2018-08-02 (×2): qty 2

## 2018-08-02 MED ORDER — METOPROLOL TARTRATE 25 MG PO TABS
25.0000 mg | ORAL_TABLET | Freq: Two times a day (BID) | ORAL | Status: DC
  Filled 2018-08-02: qty 1

## 2018-08-02 NOTE — Discharge Summary (Signed)
Physician Discharge Summary  LAURAANN MISSEY QBH:[id number] DOB: 10/20/1960 DOA: 07/27/2018  PCP: Lucia Gaskins, MD  Admit date: 07/27/2018 Discharge date: 08/02/2018  PATIENT DISCHARGED AGAINST MEDICAL ADVICE AND HIGH RISK FOR READMISSION  Brief Hospitalization Summary: Please see all hospital notes, images, labs for full details of the hospitalization. DR. Trena Platt HPI: Christine Ortega is a 58 y.o. female with medical history significant for anxiety/depression, large-gastric ulcer, prior DVT on Eliquis, bipolar disorder, schizophrenia, and chronic pain who presented to the emergency department with worsening dyspnea and hypoxemia checked by home health nurse with saturations in the 70th percentile.  She has had increased work of breathing recently and persistent dry cough with no hemoptysis or chest pain.  She denies any sick contacts and has been at home since her last discharge with no recent travel.  She denies any fevers or chills and has no significant lower extremity edema.   ED Course: Vital signs are stable and patient was noted to be in the 70th percentile on room air and is currently on 4 to 5 L nasal cannula.  Chest x-ray with some right-sided acute on chronic lung infiltration noted.  Lactic acid of 1.1.  Leukocytosis of 13,800 noted.  Potassium is 2.9.  Patient has been started on vancomycin and Zosyn and COVID testing noted to be negative.  CTA of the chest ordered and pending.  The patient was admitted for an exacerbation of chronic respiratory failure and likely right-sided pneumonia and exacerbation of her chronic interstitial lung disease and was seen by pulmonology.  She was started on broad-spectrum antibiotics and treated with Zosyn and Levaquin.  She improved somewhat with treatments.  She was going to be monitored for an additional day for respiratory stability.  Patient had been given pain medications but as she was recovering and nearing discharge we started to attempt to cut  back on her pain medication regimen.  She became irate and subsequently started yelling and screaming at the staff and violently ripped out her IV and said that she was leaving [place].  I attempted to come to speak with her but she had already left and would not wait for me to see her again.  I saw her this morning and she was doing well and was stable AND I spoke with Dr. Luan Pulling about plans to discharge her tomorrow.  I planned to discharge her tomorrow.  However because she has left AGAINST MEDICAL ADVICE I do have concern that she is going to require readmission.  Unfortunately I was not able to speak with her again prior to her leaving the hospital.  The nurses had to call security because the patient was so loud and threatening that staff did not feel safe.  Because the patient has refused treatment then we have nothing further to offer her at this time.  If she returns for treatment we will readmit.  The RN explained to the patient the risk of leaving and she verbalized understanding and signed her [place] papers.  I think that her pneumonia was fully treated although she refused to allow the nurse to administer her levaquin today.  She should follow up with her PCP and her pulmonologist.    Discharge Diagnoses:  Principal Problem:   Acute hypoxemic respiratory failure ([place]) Active Problems:   Narcotic abuse in remission ([place])   Bipolar disorder ([place])   Chronic pain   ADD (attention deficit disorder)   Schizophrenia, paranoid type ([place])   Sepsis due to undetermined organism (  [place])   Hypokalemia   HCAP (healthcare-associated pneumonia)   Discharge Instructions:   Follow-up Information    Call Lucia Gaskins, MD.   Specialty:  Internal Medicine Why:  appointment May 5th, 8:15 Contact information: [place] [address] [phone]        Sinda Du, MD. Schedule an appointment as soon as possible for a visit in 1 week(s).   Specialty:   Pulmonary Disease Contact information: [address] [place] [address] [phone]          Allergies  Allergen Reactions  . Nsaids Other (See Comments)    Causes GI bleeding  . Tramadol Hcl     Per Daughter- patient has " sleep paralysis"    Procedures/Studies: Dg Chest 2 View  Result Date: 08/01/2018 CLINICAL DATA:  Respiratory failure EXAM: CHEST - 2 VIEW COMPARISON:  05/30/2018 FINDINGS: Diffuse bilateral chronic interstitial lung disease with fibrosis. There is no focal consolidation, pleural effusion or pneumothorax. The heart mediastinum are stable. There is no acute osseous abnormality. IMPRESSION: Chronic interstitial lung disease. Mild superimposed interstitial infection or edema cannot be completely excluded. Electronically Signed   By: Kathreen Devoid   On: 08/01/2018 10:29   Ct Angio Chest Pe W And/or Wo Contrast  Result Date: 07/27/2018 CLINICAL DATA:  Shortness of breath and pain. Decreased oxygen saturation EXAM: CT ANGIOGRAPHY CHEST WITH CONTRAST TECHNIQUE: Multidetector CT imaging of the chest was performed using the standard protocol during bolus administration of intravenous contrast. Multiplanar CT image reconstructions and MIPs were obtained to evaluate the vascular anatomy. CONTRAST:  111mL OMNIPAQUE IOHEXOL 350 MG/ML SOLN COMPARISON:  Chest CT May 23, 2018; chest radiograph July 27, 2018 FINDINGS: Cardiovascular: There is no demonstrable pulmonary embolus. There is no thoracic aortic aneurysm or dissection. The visualized great vessels appear unremarkable except for slight calcification at the origins of the respective great vessels. There is mild aortic atherosclerosis. There is no pericardial effusion or pericardial thickening. Mediastinum/Nodes: Thyroid appears unremarkable. There are multiple subcentimeter mediastinal lymph nodes. There is a precarinal lymph node measuring 1.1 x 1.1 cm. There is an aortopulmonary window lymph node measuring 1.4 x 1.0  cm. There is a subcarinal lymph node measuring 1.0 x 1.0 cm. There is a right hilar lymph node measuring 1.1 x 1.0 cm. There is thickening throughout the wall of the mid and distal thirds of the esophagus. Lungs/Pleura: There is widespread fibrosis throughout the lungs diffusely with scattered small bullae. There is no frank consolidation. There is no appreciable pleural effusion or pleural thickening. Upper Abdomen: There are surgical clips in the region of the porta. Visualized upper abdominal structures otherwise appear unremarkable. Musculoskeletal: There are no blastic or lytic bone lesions. There are no appreciable chest wall lesions. Review of the MIP images confirms the above findings. IMPRESSION: 1. No demonstrable pulmonary embolus. No thoracic aortic aneurysm or dissection. There is mild aortic atherosclerosis. 2. Extensive fibrosis throughout the lungs diffusely. No frank edema or consolidation. 3. Several prominent lymph nodes which may well be of reactive etiology given the extensive parenchymal lung changes. 4. Thickening of the esophageal wall throughout the mid and distal thirds. Suspect a degree of esophagitis. Aortic Atherosclerosis (ICD10-I70.0). Electronically Signed   By: Lowella Grip III M.D.   On: 07/27/2018 14:45   Dg Chest Portable 1 View  Result Date: 07/27/2018 CLINICAL DATA:  Shortness of breath EXAM: PORTABLE CHEST 1 VIEW COMPARISON:  06/06/2018 FINDINGS: Cardiac shadow is stable. Diffuse fibrotic changes are noted throughout both  lungs somewhat increased when compared with the prior exam. This may represent some mild acute on chronic infiltrate particularly in the right lung. No sizable effusion is seen. No bony abnormality is noted. IMPRESSION: Increased density in the right lung representing some acute on chronic infiltrate. Electronically Signed   By: Inez Catalina M.D.   On: 07/27/2018 11:36     Discharge Exam: Vitals:   08/02/18 0433 08/02/18 0736  BP: (!) 150/92    Pulse: (!) 107   Resp: 16   Temp: 97.9 F (36.6 C)   SpO2: 97% 98%   Vitals:   08/01/18 1925 08/01/18 2117 08/02/18 0433 08/02/18 0736  BP:  (!) 155/97 (!) 150/92   Pulse:  (!) 117 (!) 107   Resp:  16 16   Temp:  97.8 F (36.6 C) 97.9 F (36.6 C)   TempSrc:   Oral   SpO2: 98% 96% 97% 98%  Weight:      Height:         The results of significant diagnostics from this hospitalization (including imaging, microbiology, ancillary and laboratory) are listed below for reference.     Microbiology: Recent Results (from the past 240 hour(s))  SARS Coronavirus 2 [facility] order, Performed in [facility] hospital lab)     Status: None   Collection Time: 07/27/18 11:36 AM  Result Value Ref Range Status   SARS Coronavirus 2 NEGATIVE NEGATIVE Final    Comment: (NOTE) If result is NEGATIVE SARS-CoV-2 target nucleic acids are NOT DETECTED. The SARS-CoV-2 RNA is generally detectable in upper and lower  respiratory specimens during the acute phase of infection. The lowest  concentration of SARS-CoV-2 viral copies this assay can detect is 250  copies / mL. A negative result does not preclude SARS-CoV-2 infection  and should not be used as the sole basis for treatment or other  patient management decisions.  A negative result may occur with  improper specimen collection / handling, submission of specimen other  than nasopharyngeal swab, presence of viral mutation(s) within the  areas targeted by this assay, and inadequate number of viral copies  (<250 copies / mL). A negative result must be combined with clinical  observations, patient history, and epidemiological information. If result is POSITIVE SARS-CoV-2 target nucleic acids are DETECTED. The SARS-CoV-2 RNA is generally detectable in upper and lower  respiratory specimens dur ing the acute phase of infection.  Positive  results are indicative of active infection with SARS-CoV-2.  Clinical  correlation with patient history and other  diagnostic information is  necessary to determine patient infection status.  Positive results do  not rule out bacterial infection or co-infection with other viruses. If result is PRESUMPTIVE POSTIVE SARS-CoV-2 nucleic acids MAY BE PRESENT.   A presumptive positive result was obtained on the submitted specimen  and confirmed on repeat testing.  While 2019 novel coronavirus  (SARS-CoV-2) nucleic acids may be present in the submitted sample  additional confirmatory testing may be necessary for epidemiological  and / or clinical management purposes  to differentiate between  SARS-CoV-2 and other Sarbecovirus currently known to infect humans.  If clinically indicated additional testing with an alternate test  methodology [phone]) is advised. The SARS-CoV-2 RNA is generally  detectable in upper and lower respiratory sp ecimens during the acute  phase of infection. The expected result is Negative. Fact Sheet for Patients:  StrictlyIdeas.no Fact Sheet for Healthcare Providers: BankingDealers.co.za This test is not yet approved or cleared by the [place] FDA and  has been authorized for detection and/or diagnosis of SARS-CoV-2 by FDA under an Emergency Use Authorization (EUA).  This EUA will remain in effect (meaning this test can be used) for the duration of the COVID-19 declaration under Section 564(b)(1) of the Act, 21 U.S.C. section 360bbb-3(b)(1), unless the authorization is terminated or revoked sooner. Performed at [facility], [address], [place], [place] [address]   Blood culture (routine x 2)     Status: None   Collection Time: 07/27/18 12:09 PM  Result Value Ref Range Status   Specimen Description   Final    BLOOD RIGHT ARM BOTTLES DRAWN AEROBIC AND ANAEROBIC   Special Requests   Final    Blood Culture results may not be optimal due to an excessive volume of blood received in culture bottles   Culture   Final    NO GROWTH  5 DAYS Performed at [facility], [address] [place], [place] [address]    Report Status 08/01/2018 FINAL  Final  Blood culture (routine x 2)     Status: None   Collection Time: 07/27/18 12:18 PM  Result Value Ref Range Status   Specimen Description BLOOD LEFT ARM BOTTLES DRAWN AEROBIC AND ANAEROBIC  Final   Special Requests Blood Culture adequate volume  Final   Culture   Final    NO GROWTH 5 DAYS Performed at [facility], [address], [place], [place] [address]    Report Status 08/01/2018 FINAL  Final  Respiratory Panel by PCR     Status: None   Collection Time: 07/27/18  3:14 PM  Result Value Ref Range Status   Adenovirus NOT DETECTED NOT DETECTED Final   Coronavirus 229E NOT DETECTED NOT DETECTED Final    Comment: (NOTE) The Coronavirus on the Respiratory Panel, DOES NOT test for the novel  Coronavirus (2019 nCoV)    Coronavirus HKU1 NOT DETECTED NOT DETECTED Final   Coronavirus NL63 NOT DETECTED NOT DETECTED Final   Coronavirus OC43 NOT DETECTED NOT DETECTED Final   Metapneumovirus NOT DETECTED NOT DETECTED Final   Rhinovirus / Enterovirus NOT DETECTED NOT DETECTED Final   Influenza A NOT DETECTED NOT DETECTED Final   Influenza B NOT DETECTED NOT DETECTED Final   Parainfluenza Virus 1 NOT DETECTED NOT DETECTED Final   Parainfluenza Virus 2 NOT DETECTED NOT DETECTED Final   Parainfluenza Virus 3 NOT DETECTED NOT DETECTED Final   Parainfluenza Virus 4 NOT DETECTED NOT DETECTED Final   Respiratory Syncytial Virus NOT DETECTED NOT DETECTED Final   Bordetella pertussis NOT DETECTED NOT DETECTED Final   Chlamydophila pneumoniae NOT DETECTED NOT DETECTED Final   Mycoplasma pneumoniae NOT DETECTED NOT DETECTED Final    Comment: Performed at [facility], [place] [address], [place], [place] [address]  MRSA PCR Screening     Status: None   Collection Time: 07/27/18  3:14 PM  Result Value Ref Range Status   MRSA by PCR NEGATIVE NEGATIVE Final    Comment:        The  GeneXpert MRSA Assay (FDA approved for NASAL specimens only), is one component of a comprehensive MRSA colonization surveillance program. It is not intended to diagnose MRSA infection nor to guide or monitor treatment for MRSA infections. Performed at [facility], [address] [place],  [address]      Labs: BNP (last 3 results) Recent Labs    07/27/18 1208  BNP 13.2   Basic Metabolic Panel: Recent Labs  Lab 07/27/18 1400 07/28/18 0620  07/29/18 0627 07/30/18 0427 07/31/18 0519 08/01/18 0430  NA  --  140 136 137 136 138  K  --  3.8 5.1 4.8 3.8 3.7  CL  --  108 101 98 97* 99  CO2  --  25 27 29 27 29   GLUCOSE  --  89 140* 127* 175* 142*  BUN  --  6 6 8 6  5*  CREATININE  --  0.45 0.51 0.73 0.67 0.53  CALCIUM  --  8.6* 9.4 9.7 9.2 9.2  MG 1.9 1.7  --   --   --   --    Liver Function Tests: Recent Labs  Lab 07/27/18 1206  AST 22  ALT 13  ALKPHOS 109  BILITOT 0.2*  PROT 6.2*  ALBUMIN 2.7*   No results for input(s): LIPASE, AMYLASE in the last 168 hours. No results for input(s): AMMONIA in the last 168 hours. CBC: Recent Labs  Lab 07/27/18 1206 07/28/18 0620 07/29/18 0627 07/30/18 0427 07/31/18 0519 08/01/18 0430  WBC 13.8* 12.6* 6.9 12.3* 11.1* 9.9  NEUTROABS 7.7  --   --   --   --   --   HGB 11.8* 11.0* 11.4* 11.5* 11.5* 10.9*  HCT 37.0 35.2* 36.4 36.8 36.5 35.2*  MCV 96.4 98.1 98.6 99.2 98.4 98.6  PLT 594* 532* 529* 669* 602* 596*   Cardiac Enzymes: Recent Labs  Lab 07/27/18 1206  TROPONINI <0.03   BNP: Invalid input(s): POCBNP CBG: No results for input(s): GLUCAP in the last 168 hours. D-Dimer No results for input(s): DDIMER in the last 72 hours. Hgb A1c No results for input(s): HGBA1C in the last 72 hours. Lipid Profile No results for input(s): CHOL, HDL, LDLCALC, TRIG, CHOLHDL, LDLDIRECT in the last 72 hours. Thyroid function studies No results for input(s): TSH, T4TOTAL, T3FREE, THYROIDAB in the last 72 hours.  Invalid  input(s): FREET3 Anemia work up No results for input(s): VITAMINB12, FOLATE, FERRITIN, TIBC, IRON, RETICCTPCT in the last 72 hours. Urinalysis    Component Value Date/Time   COLORURINE YELLOW 05/19/2018 0921   APPEARANCEUR CLOUDY (A) 05/19/2018 0921   LABSPEC 1.008 05/19/2018 0921   PHURINE 6.0 05/19/2018 0921   GLUCOSEU NEGATIVE 05/19/2018 0921   HGBUR SMALL (A) 05/19/2018 0921   BILIRUBINUR NEGATIVE 05/19/2018 0921   KETONESUR NEGATIVE 05/19/2018 0921   PROTEINUR 30 (A) 05/19/2018 0921   NITRITE NEGATIVE 05/19/2018 0921   LEUKOCYTESUR LARGE (A) 05/19/2018 0921   Sepsis Labs Invalid input(s): PROCALCITONIN,  WBC,  LACTICIDVEN Microbiology Recent Results (from the past 240 hour(s))  SARS Coronavirus 2 [facility] order, Performed in [place] hospital lab)     Status: None   Collection Time: 07/27/18 11:36 AM  Result Value Ref Range Status   SARS Coronavirus 2 NEGATIVE NEGATIVE Final    Comment: (NOTE) If result is NEGATIVE SARS-CoV-2 target nucleic acids are NOT DETECTED. The SARS-CoV-2 RNA is generally detectable in upper and lower  respiratory specimens during the acute phase of infection. The lowest  concentration of SARS-CoV-2 viral copies this assay can detect is 250  copies / mL. A negative result does not preclude SARS-CoV-2 infection  and should not be used as the sole basis for treatment or other  patient management decisions.  A negative result may occur with  improper specimen collection / handling, submission of specimen other  than nasopharyngeal swab, presence of viral mutation(s) within the  areas targeted by this assay, and inadequate number of viral copies  (<250 copies / mL). A negative  result must be combined with clinical  observations, patient history, and epidemiological information. If result is POSITIVE SARS-CoV-2 target nucleic acids are DETECTED. The SARS-CoV-2 RNA is generally detectable in upper and lower  respiratory specimens dur ing the  acute phase of infection.  Positive  results are indicative of active infection with SARS-CoV-2.  Clinical  correlation with patient history and other diagnostic information is  necessary to determine patient infection status.  Positive results do  not rule out bacterial infection or co-infection with other viruses. If result is PRESUMPTIVE POSTIVE SARS-CoV-2 nucleic acids MAY BE PRESENT.   A presumptive positive result was obtained on the submitted specimen  and confirmed on repeat testing.  While 2019 novel coronavirus  (SARS-CoV-2) nucleic acids may be present in the submitted sample  additional confirmatory testing may be necessary for epidemiological  and / or clinical management purposes  to differentiate between  SARS-CoV-2 and other Sarbecovirus currently known to infect humans.  If clinically indicated additional testing with an alternate test  methodology [phone]) is advised. The SARS-CoV-2 RNA is generally  detectable in upper and lower respiratory sp ecimens during the acute  phase of infection. The expected result is Negative. Fact Sheet for Patients:  StrictlyIdeas.no Fact Sheet for Healthcare Providers: BankingDealers.co.za This test is not yet approved or cleared by the [place] FDA and has been authorized for detection and/or diagnosis of SARS-CoV-2 by FDA under an Emergency Use Authorization (EUA).  This EUA will remain in effect (meaning this test can be used) for the duration of the COVID-19 declaration under Section 564(b)(1) of the Act, 21 U.S.C. section 360bbb-3(b)(1), unless the authorization is terminated or revoked sooner. Performed at [facility], [address], [place], [place] [address]   Blood culture (routine x 2)     Status: None   Collection Time: 07/27/18 12:09 PM  Result Value Ref Range Status   Specimen Description   Final    BLOOD RIGHT ARM BOTTLES DRAWN AEROBIC AND ANAEROBIC   Special  Requests   Final    Blood Culture results may not be optimal due to an excessive volume of blood received in culture bottles   Culture   Final    NO GROWTH 5 DAYS Performed at [facility], [address] [place] [address], [place], [place] [address]    Report Status 08/01/2018 FINAL  Final  Blood culture (routine x 2)     Status: None   Collection Time: 07/27/18 12:18 PM  Result Value Ref Range Status   Specimen Description BLOOD LEFT ARM BOTTLES DRAWN AEROBIC AND ANAEROBIC  Final   Special Requests Blood Culture adequate volume  Final   Culture   Final    NO GROWTH 5 DAYS Performed at [facility], [address], [place],  [address]    Report Status 08/01/2018 FINAL  Final  Respiratory Panel by PCR     Status: None   Collection Time: 07/27/18  3:14 PM  Result Value Ref Range Status   Adenovirus NOT DETECTED NOT DETECTED Final   Coronavirus 229E NOT DETECTED NOT DETECTED Final    Comment: (NOTE) The Coronavirus on the Respiratory Panel, DOES NOT test for the novel  Coronavirus (2019 nCoV)    Coronavirus HKU1 NOT DETECTED NOT DETECTED Final   Coronavirus NL63 NOT DETECTED NOT DETECTED Final   Coronavirus OC43 NOT DETECTED NOT DETECTED Final   Metapneumovirus NOT DETECTED NOT DETECTED Final   Rhinovirus / Enterovirus NOT DETECTED NOT DETECTED Final   Influenza A NOT DETECTED  NOT DETECTED Final   Influenza B NOT DETECTED NOT DETECTED Final   Parainfluenza Virus 1 NOT DETECTED NOT DETECTED Final   Parainfluenza Virus 2 NOT DETECTED NOT DETECTED Final   Parainfluenza Virus 3 NOT DETECTED NOT DETECTED Final   Parainfluenza Virus 4 NOT DETECTED NOT DETECTED Final   Respiratory Syncytial Virus NOT DETECTED NOT DETECTED Final   Bordetella pertussis NOT DETECTED NOT DETECTED Final   Chlamydophila pneumoniae NOT DETECTED NOT DETECTED Final   Mycoplasma pneumoniae NOT DETECTED NOT DETECTED Final    Comment: Performed at [facility], [place] [address] [place], [place] [address]  MRSA PCR  Screening     Status: None   Collection Time: 07/27/18  3:14 PM  Result Value Ref Range Status   MRSA by PCR NEGATIVE NEGATIVE Final    Comment:        The GeneXpert MRSA Assay (FDA approved for NASAL specimens only), is one component of a comprehensive MRSA colonization surveillance program. It is not intended to diagnose MRSA infection nor to guide or monitor treatment for MRSA infections. Performed at [facility], [address], [place], [place] [address]    Time coordinating discharge:   SIGNED:  Irwin Brakeman, MD  [facility] 08/02/2018, 2:10 PM How to contact the [facility] Attending or Consulting provider [place] or covering provider during after hours [place], for this patient?  1. Check the care team in [facility] and look for a) attending/consulting [facility] provider listed and b) the [facility] team listed 2. Log into [email or web address] and use [place]'s universal password to access. If you do not have the password, please contact the hospital operator. 3. Locate the [facility] provider you are looking for under [facility] and page to a number that you can be directly reached. 4. If you still have difficulty reaching the provider, please page the [facility] (Director on Call) for the Hospitalists listed on amion for assistance.

## 2018-08-02 NOTE — Progress Notes (Signed)
Subjective: She says she feels a little better.  She is still coughing.  No change otherwise.  She is coughing up a little bit of sputum.  She had chest x-ray yesterday that really does not show any substantial change  Objective: Vital signs in last 24 hours: Temp:  [97.8 F (36.6 C)-97.9 F (36.6 C)] 97.9 F (36.6 C) (04/30 0433) Pulse Rate:  [107-117] 107 (04/30 0433) Resp:  [16-19] 16 (04/30 0433) BP: (133-155)/(90-97) 150/92 (04/30 0433) SpO2:  [94 %-98 %] 98 % (04/30 0736) Weight change:  Last BM Date: 07/28/18  Intake/Output from previous day: 04/29 0701 - 04/30 0700 In: 2040 [P.O.:2040] Out: 2650 [Urine:2650]  PHYSICAL EXAM General appearance: alert, cooperative and mild distress Resp: rales bilaterally Cardio: regular rate and rhythm, S1, S2 normal, no murmur, click, rub or gallop GI: soft, non-tender; bowel sounds normal; no masses,  no organomegaly Extremities: extremities normal, atraumatic, no cyanosis or edema  Lab Results:  Results for orders placed or performed during the hospital encounter of 07/27/18 (from the past 48 hour(s))  CBC     Status: Abnormal   Collection Time: 08/01/18  4:30 AM  Result Value Ref Range   WBC 9.9 4.0 - 10.5 K/uL   RBC 3.57 (L) 3.87 - 5.11 MIL/uL   Hemoglobin 10.9 (L) 12.0 - 15.0 g/dL   HCT 35.2 (L) 36.0 - 46.0 %   MCV 98.6 80.0 - 100.0 fL   MCH 30.5 26.0 - 34.0 pg   MCHC 31.0 30.0 - 36.0 g/dL   RDW 15.8 (H) 11.5 - 15.5 %   Platelets 596 (H) 150 - 400 K/uL   nRBC 0.0 0.0 - 0.2 %    Comment: Performed at [facility], [address] [place], [place] [address]  Basic metabolic panel     Status: Abnormal   Collection Time: 08/01/18  4:30 AM  Result Value Ref Range   Sodium 138 135 - 145 mmol/L   Potassium 3.7 3.5 - 5.1 mmol/L   Chloride 99 98 - 111 mmol/L   CO2 29 22 - 32 mmol/L   Glucose, Bld 142 (H) 70 - 99 mg/dL   BUN 5 (L) 6 - 20 mg/dL   Creatinine, Ser 0.53 0.44 - 1.00 mg/dL   Calcium 9.2 8.9 - 10.3 mg/dL   GFR calc  non Af Amer >60 >60 mL/min   GFR calc Af Amer >60 >60 mL/min   Anion gap 10 5 - 15    Comment: Performed at [facility], [address] [place], [place] [address]    ABGS No results for input(s): PHART, PO2ART, TCO2, HCO3 in the last 72 hours.  Invalid input(s): PCO2 CULTURES Recent Results (from the past 240 hour(s))  SARS Coronavirus 2 [facility] order, Performed in [facility] hospital lab)     Status: None   Collection Time: 07/27/18 11:36 AM  Result Value Ref Range Status   SARS Coronavirus 2 NEGATIVE NEGATIVE Final    Comment: (NOTE) If result is NEGATIVE SARS-CoV-2 target nucleic acids are NOT DETECTED. The SARS-CoV-2 RNA is generally detectable in upper and lower  respiratory specimens during the acute phase of infection. The lowest  concentration of SARS-CoV-2 viral copies this assay can detect is 250  copies / mL. A negative result does not preclude SARS-CoV-2 infection  and should not be used as the sole basis for treatment or other  patient management decisions.  A negative result may occur with  improper specimen collection / handling, submission of specimen other  than nasopharyngeal swab, presence of viral mutation(s) within the  areas targeted by this assay, and inadequate number of viral copies  (<250 copies / mL). A negative result must be combined with clinical  observations, patient history, and epidemiological information. If result is POSITIVE SARS-CoV-2 target nucleic acids are DETECTED. The SARS-CoV-2 RNA is generally detectable in upper and lower  respiratory specimens dur ing the acute phase of infection.  Positive  results are indicative of active infection with SARS-CoV-2.  Clinical  correlation with patient history and other diagnostic information is  necessary to determine patient infection status.  Positive results do  not rule out bacterial infection or co-infection with other viruses. If result is PRESUMPTIVE POSTIVE SARS-CoV-2 nucleic acids  MAY BE PRESENT.   A presumptive positive result was obtained on the submitted specimen  and confirmed on repeat testing.  While 2019 novel coronavirus  (SARS-CoV-2) nucleic acids may be present in the submitted sample  additional confirmatory testing may be necessary for epidemiological  and / or clinical management purposes  to differentiate between  SARS-CoV-2 and other Sarbecovirus currently known to infect humans.  If clinically indicated additional testing with an alternate test  methodology [phone]) is advised. The SARS-CoV-2 RNA is generally  detectable in upper and lower respiratory sp ecimens during the acute  phase of infection. The expected result is Negative. Fact Sheet for Patients:  StrictlyIdeas.no Fact Sheet for Healthcare Providers: BankingDealers.co.za This test is not yet approved or cleared by the [place] FDA and has been authorized for detection and/or diagnosis of SARS-CoV-2 by FDA under an Emergency Use Authorization (EUA).  This EUA will remain in effect (meaning this test can be used) for the duration of the COVID-19 declaration under Section 564(b)(1) of the Act, 21 U.S.C. section 360bbb-3(b)(1), unless the authorization is terminated or revoked sooner. Performed at [facility], [address], [place], [place] [address]   Blood culture (routine x 2)     Status: None   Collection Time: 07/27/18 12:09 PM  Result Value Ref Range Status   Specimen Description   Final    BLOOD RIGHT ARM BOTTLES DRAWN AEROBIC AND ANAEROBIC   Special Requests   Final    Blood Culture results may not be optimal due to an excessive volume of blood received in culture bottles   Culture   Final    NO GROWTH 5 DAYS Performed at [facility], [address] [place], [place] [address]    Report Status 08/01/2018 FINAL  Final  Blood culture (routine x 2)     Status: None   Collection Time: 07/27/18 12:18 PM  Result Value Ref Range  Status   Specimen Description BLOOD LEFT ARM BOTTLES DRAWN AEROBIC AND ANAEROBIC  Final   Special Requests Blood Culture adequate volume  Final   Culture   Final    NO GROWTH 5 DAYS Performed at [facility], [address] [place],  [address]    Report Status 08/01/2018 FINAL  Final  Respiratory Panel by PCR     Status: None   Collection Time: 07/27/18  3:14 PM  Result Value Ref Range Status   Adenovirus NOT DETECTED NOT DETECTED Final   Coronavirus 229E NOT DETECTED NOT DETECTED Final    Comment: (NOTE) The Coronavirus on the Respiratory Panel, DOES NOT test for the novel  Coronavirus (2019 nCoV)    Coronavirus HKU1 NOT DETECTED NOT DETECTED Final   Coronavirus NL63 NOT DETECTED NOT DETECTED Final   Coronavirus OC43 NOT  DETECTED NOT DETECTED Final   Metapneumovirus NOT DETECTED NOT DETECTED Final   Rhinovirus / Enterovirus NOT DETECTED NOT DETECTED Final   Influenza A NOT DETECTED NOT DETECTED Final   Influenza B NOT DETECTED NOT DETECTED Final   Parainfluenza Virus 1 NOT DETECTED NOT DETECTED Final   Parainfluenza Virus 2 NOT DETECTED NOT DETECTED Final   Parainfluenza Virus 3 NOT DETECTED NOT DETECTED Final   Parainfluenza Virus 4 NOT DETECTED NOT DETECTED Final   Respiratory Syncytial Virus NOT DETECTED NOT DETECTED Final   Bordetella pertussis NOT DETECTED NOT DETECTED Final   Chlamydophila pneumoniae NOT DETECTED NOT DETECTED Final   Mycoplasma pneumoniae NOT DETECTED NOT DETECTED Final    Comment: Performed at [facility], [place] [address] [place], [place] [address]  MRSA PCR Screening     Status: None   Collection Time: 07/27/18  3:14 PM  Result Value Ref Range Status   MRSA by PCR NEGATIVE NEGATIVE Final    Comment:        The GeneXpert MRSA Assay (FDA approved for NASAL specimens only), is one component of a comprehensive MRSA colonization surveillance program. It is not intended to diagnose MRSA infection nor to guide or monitor treatment  for MRSA infections. Performed at [facility], [address], [place], [place] [address]    Studies/Results: Dg Chest 2 View  Result Date: 08/01/2018 CLINICAL DATA:  Respiratory failure EXAM: CHEST - 2 VIEW COMPARISON:  05/30/2018 FINDINGS: Diffuse bilateral chronic interstitial lung disease with fibrosis. There is no focal consolidation, pleural effusion or pneumothorax. The heart mediastinum are stable. There is no acute osseous abnormality. IMPRESSION: Chronic interstitial lung disease. Mild superimposed interstitial infection or edema cannot be completely excluded. Electronically Signed   By: Kathreen Devoid   On: 08/01/2018 10:29    Medications:  Prior to Admission:  Medications Prior to Admission  Medication Sig Dispense Refill Last Dose  . acetaminophen (TYLENOL) 500 MG tablet Take 1 tablet (500 mg total) by mouth every 8 (eight) hours as needed for moderate pain. 30 tablet 0 Past Week at Unknown time  . apixaban (ELIQUIS) 5 MG TABS tablet Take 1 tablet (5 mg total) by mouth 2 (two) times daily. 2 pills mouth twice a day till 06/12/2018 then switch to 1 pill twice a day from 06/13/2018 (Patient taking differently: Take 10 mg by mouth 2 (two) times daily. ) 60 tablet 0 07/26/2018 at 0500pm  . cetirizine (ZYRTEC) 10 MG tablet Take 10 mg by mouth daily.   07/27/2018 at Unknown time  . FLUoxetine (PROZAC) 40 MG capsule Take 1 capsule (40 mg total) by mouth daily. For depression and anxiety. 30 capsule 0 07/27/2018 at Unknown time  . guaiFENesin (MUCINEX) 600 MG 12 hr tablet Take 800 mg by mouth 2 (two) times daily.    07/26/2018 at Unknown time  . pantoprazole (PROTONIX) 40 MG tablet Take 1 tablet (40 mg total) by mouth 2 (two) times daily. 60 tablet 0 07/27/2018 at Unknown time  . Probiotic Product (PROBIOTIC PO) Take 1 tablet by mouth daily. Women's probiotic for GI health   07/26/2018 at Unknown time  . divalproex (DEPAKOTE ER) 500 MG 24 hr tablet Take 500 mg by mouth at bedtime. For moods   Not  Taking at Unknown time  . lactose free nutrition (BOOST PLUS) LIQD Take 237 mLs by mouth 3 (three) times daily with meals. (Patient not taking: Reported on 07/27/2018) 30 Can 0 Not Taking at Unknown time  . Melatonin  5 MG CAPS Take 20 mg by mouth at bedtime.    Not Taking at Unknown time  . metoprolol tartrate (LOPRESSOR) 25 MG tablet Take 1 tablet (25 mg total) by mouth 2 (two) times daily. (Patient not taking: Reported on 07/27/2018) 60 tablet 0 Not Taking at Unknown time  . ziprasidone (GEODON) 80 MG capsule Take one capsule at bedtime for depression and mood stability. (Patient not taking: Reported on 07/27/2018) 30 capsule 0 Not Taking at Unknown time   Scheduled: . apixaban  5 mg Oral BID  . benzonatate  200 mg Oral TID  . budesonide (PULMICORT) nebulizer solution  0.25 mg Nebulization BID  . divalproex  500 mg Oral QHS  . FLUoxetine  40 mg Oral Daily  . guaiFENesin  600 mg Oral BID  . ipratropium  0.5 mg Nebulization Q6H WA  . levalbuterol  1.25 mg Nebulization Q6H WA  . lidocaine  1 patch Transdermal Q24H  . loratadine  10 mg Oral Daily  . methylPREDNISolone (SOLU-MEDROL) injection  40 mg Intravenous Q12H  . metoprolol tartrate  25 mg Oral BID  . pantoprazole  40 mg Oral BID  . sodium chloride flush  3 mL Intravenous Q12H   Continuous: . sodium chloride    . levofloxacin (LEVAQUIN) IV 500 mg (08/01/18 1100)  . piperacillin-tazobactam (ZOSYN)  IV 3.375 g (08/02/18 0512)   DJT:TSVXBL chloride, fentaNYL (SUBLIMAZE) injection, guaiFENesin-dextromethorphan, HYDROcodone-acetaminophen, ondansetron **OR** ondansetron (ZOFRAN) IV, sodium chloride flush  Assesment: She was admitted with acute hypoxic respiratory failure.  This is probably actually acute on chronic because she was to be discharged on oxygen at her last hospitalization which did not happen.  She has healthcare associated pneumonia.  She has interstitial pulmonary disease that has markedly progressed from CT done in February  of this year.  She is being treated as if this is chronic organizing pneumonia.  She has multiple other medical problems complicating her situation Principal Problem:   Acute hypoxemic respiratory failure ([place]) Active Problems:   Narcotic abuse in remission ([place])   Bipolar disorder (HCC)   Chronic pain   ADD (attention deficit disorder)   Schizophrenia, paranoid type ([place])   Sepsis due to undetermined organism ([place])   Hypokalemia   HCAP (healthcare-associated pneumonia)    Plan: I think the most likely form of pneumonia that she has would be an interstitial pneumonia so I think it is okay to discontinue Zosyn at this point.    LOS: 6 days   Alonza Bogus 08/02/2018, 8:48 AM

## 2018-08-02 NOTE — Discharge Instructions (Signed)
°  IMPORTANT INFORMATION: PAY CLOSE ATTENTION   PHYSICIAN DISCHARGE INSTRUCTIONS  Follow with Primary care provider  Lucia Gaskins, MD  and other consultants as instructed your Hospitalist Physician  SEEK MEDICAL CARE OR RETURN TO EMERGENCY ROOM IF SYMPTOMS COME BACK, WORSEN OR NEW PROBLEM DEVELOPS.   Please note: You were cared for by a hospitalist during your hospital stay. Every effort will be made to forward records to your primary care provider.  You can request that your primary care provider send for your hospital records if they have not received them.  Once you are discharged, your primary care physician will handle any further medical issues. Please note that NO REFILLS for any discharge medications will be authorized once you are discharged, as it is imperative that you return to your primary care physician (or establish a relationship with a primary care physician if you do not have one) for your post hospital discharge needs so that they can reassess your need for medications and monitor your lab values.  Please get a complete blood count and chemistry panel checked by your Primary MD at your next visit, and again as instructed by your Primary MD.  Get Medicines reviewed and adjusted: Please take all your medications with you for your next visit with your Primary MD  Laboratory/radiological data: Please request your Primary MD to go over all hospital tests and procedure/radiological results at the follow up, please ask your primary care provider to get all Hospital records sent to his/her office.  In some cases, they will be blood work, cultures and biopsy results pending at the time of your discharge. Please request that your primary care provider follow up on these results.  If you are diabetic, please bring your blood sugar readings with you to your follow up appointment with primary care.    Please call and make your follow up appointments as soon as possible.    Also  Note the following: If you experience worsening of your admission symptoms, develop shortness of breath, life threatening emergency, suicidal or homicidal thoughts you must seek medical attention immediately by calling 911 or calling your MD immediately  if symptoms less severe.  You must read complete instructions/literature along with all the possible adverse reactions/side effects for all the Medicines you take and that have been prescribed to you. Take any new Medicines after you have completely understood and accpet all the possible adverse reactions/side effects.   Do not drive when taking Pain medications or sleeping medications (Benzodiazepines)  Do not take more than prescribed Pain, Sleep and Anxiety Medications. It is not advisable to combine anxiety,sleep and pain medications without talking with your primary care practitioner  Special Instructions: If you have smoked or chewed Tobacco  in the last 2 yrs please stop smoking, stop any regular Alcohol  and or any Recreational drug use.  Wear Seat belts while driving.

## 2018-08-02 NOTE — Progress Notes (Signed)
Pharmacy Antibiotic Note  Today is day #4 of  Zosyn  And day #3 of Levaquin therapy for this 58 yo female with  pneumonia. Blood and respiratory cultures have shown no growth to date.  Renal function appears stable at this time.  Plan: Continue Zosyn 3.375g IV q8h (4-hr infusion) Re-assess need for double gram negative coverage with Levaquin. Monitor labs, vitals and patient progress.  Height: 5\' 3"  (160 cm) Weight: 120 lb 2.4 oz (54.5 kg) IBW/kg (Calculated) : 52.4  Temp (24hrs), Avg:97.8 F (36.6 C), Min:97.8 F (36.6 C), Max:97.9 F (36.6 C)  Recent Labs  Lab 07/27/18 1210 07/27/18 1400 07/28/18 0620 07/29/18 0627 07/30/18 0427 07/31/18 0519 08/01/18 0430  WBC  --   --  12.6* 6.9 12.3* 11.1* 9.9  CREATININE  --   --  0.45 0.51 0.73 0.67 0.53  LATICACIDVEN 1.1 0.8  --   --   --   --   --     Estimated Creatinine Clearance: 64.2 mL/min (by C-G formula based on SCr of 0.53 mg/dL).    Allergies  Allergen Reactions  . Nsaids Other (See Comments)    Causes GI bleeding  . Tramadol Hcl     Per Daughter- patient has " sleep paralysis"    Antimicrobials this admission: Vancomycin 4/24 >> 4/26 Zosyn 4/24 >>  Levaquin 4/25>>   Microbiology results: 4/24 [facility] x2: NGTD 4/24 MRSA PCR: neg 4/24 SARS Coronavirus 2 : negative 4/24 Resp PCR: NGTD  Thank you for allowing pharmacy to be a part of this patient's care.  Donna Christen Neeti Knudtson 08/02/2018 8:24 AM

## 2018-08-02 NOTE — Progress Notes (Signed)
I went in to give patient her levofloxacin and vicodin around 1220. She was very upset that the doctor had reduced her vicodin to 1 tablet. She started screaming and told me not to hang her antibiotic because she was leaving. I paged Dr. Wynetta Emery to let him know that patient was going to leave AMA. I called security to come talk with her because she was screaming. Security came. Patient pulled out her IV and came walking down the hall. She signed her AMA papers and was wheeled to short stay entrance. Patient stated that daughter was downstairs waiting for her but she was not. Daughter called and talked with Genell the RN. Daughter said she was not at the hospital but could be in 5 minutes. Genell waited for daughter to arrive. Genell talked with daughter and the daughter wanted to talk with case management and get d/c paperwork. Genell advised daughter that patient had left AMA and did not have paperwork and that she would need to contact her PCP.

## 2018-08-02 NOTE — Progress Notes (Signed)
Pt noted to be yelling out, cursing at people who were not present while I was at bedside. When asking about this she began to yell at me stating "why does it matter dammit!" Attempted to calm pt and she became more irate when she realized I didn't bring both her IV and po pain medication. Notified security to make frequent rounds and go back into room with me. She then called daughter and told her that the voices in her head were telling her things. Daughter April Hemberger, called me and stated that her mother has these episodes from time to time since she was taken off her schizophrenic meds back in March d/t excessive drowsiness. She stated that Ativan 1mg  usually calms her down and not to give Haldol because she has adverse reactions. Notified mid-level and received an order for Ativan IV, however pt. refused to take it stating she only wanted her pain medication. Once pain medicine was given, she appeared to be more calm. Daughter is requesting she be seen by psych while she is here. Will notify MD of this and continue to monitor.

## 2018-08-02 NOTE — TOC Progression Note (Signed)
Transition of Care [facility]) - Progression Note    Patient Details  Name: Christine Ortega MRN: [id number] Date of Birth: 08-26-1960  [facility] CM/SW Contact  Stanisha Lorenz, Chauncey [place], RN Phone Number: 08/02/2018, 10:03 AM  Clinical Narrative:   Call again to daughter, was able to reach her. She reports she has not started working on oxygen for her mom. Discussed home oxygen eval with daughter and necessary qualifications.  Patient has RW, BSC, shower chair and neb machine. Discussed with daughter if she felt PT eval wound be necessary to determine if patient needs SNF, daughter reports that she woulnd not want patient to go to SNF if recommended but return home with home health services. Patient is currently active with [facility]. Tim of [facility] aware of patient admission.   CM will follow, patient will need a home O2 eval prior to DC. Noted that CXR has Chronic interstitial lung disease, will use this as qualifying diagnosis if needed.     Expected Discharge Plan: [place] Barriers to Discharge: Equipment Delay  Expected Discharge Plan and Services Expected Discharge Plan: [place]   Discharge Planning Services: CM Consult Post Acute Care Choice: [place] arrangements for the past 2 months: Single Family Home                     Readmission Risk Interventions Readmission Risk Prevention Plan 07/31/2018  Transportation Screening Complete  PCP or Specialist Appt within 3-5 Days Complete  HRI or Home Care Consult Complete  Social Work Consult for [place] Planning/Counseling Complete  Palliative Care Screening Not Applicable  Medication Review [employer] photographer) Complete  Some recent data might be hidden

## 2018-08-08 DIAGNOSIS — J159 Unspecified bacterial pneumonia: Secondary | ICD-10-CM | POA: Diagnosis not present

## 2018-08-08 DIAGNOSIS — R05 Cough: Secondary | ICD-10-CM | POA: Diagnosis not present

## 2018-08-08 DIAGNOSIS — J449 Chronic obstructive pulmonary disease, unspecified: Secondary | ICD-10-CM | POA: Diagnosis not present

## 2018-08-08 DIAGNOSIS — R5382 Chronic fatigue, unspecified: Secondary | ICD-10-CM | POA: Diagnosis not present

## 2018-08-10 DIAGNOSIS — Z7901 Long term (current) use of anticoagulants: Secondary | ICD-10-CM | POA: Diagnosis not present

## 2018-08-10 DIAGNOSIS — Z87891 Personal history of nicotine dependence: Secondary | ICD-10-CM | POA: Diagnosis not present

## 2018-08-10 DIAGNOSIS — J189 Pneumonia, unspecified organism: Secondary | ICD-10-CM | POA: Diagnosis not present

## 2018-08-10 DIAGNOSIS — Z86718 Personal history of other venous thrombosis and embolism: Secondary | ICD-10-CM | POA: Diagnosis not present

## 2018-08-10 DIAGNOSIS — Z7952 Long term (current) use of systemic steroids: Secondary | ICD-10-CM | POA: Diagnosis not present

## 2018-08-10 DIAGNOSIS — J440 Chronic obstructive pulmonary disease with acute lower respiratory infection: Secondary | ICD-10-CM | POA: Diagnosis not present

## 2018-08-10 DIAGNOSIS — Z7951 Long term (current) use of inhaled steroids: Secondary | ICD-10-CM | POA: Diagnosis not present

## 2018-08-10 DIAGNOSIS — G8929 Other chronic pain: Secondary | ICD-10-CM | POA: Diagnosis not present

## 2018-08-13 DIAGNOSIS — J449 Chronic obstructive pulmonary disease, unspecified: Secondary | ICD-10-CM | POA: Diagnosis not present

## 2018-08-13 DIAGNOSIS — R0902 Hypoxemia: Secondary | ICD-10-CM | POA: Diagnosis not present

## 2018-08-14 DIAGNOSIS — G8929 Other chronic pain: Secondary | ICD-10-CM | POA: Diagnosis not present

## 2018-08-14 DIAGNOSIS — Z86718 Personal history of other venous thrombosis and embolism: Secondary | ICD-10-CM | POA: Diagnosis not present

## 2018-08-14 DIAGNOSIS — Z7901 Long term (current) use of anticoagulants: Secondary | ICD-10-CM | POA: Diagnosis not present

## 2018-08-14 DIAGNOSIS — R05 Cough: Secondary | ICD-10-CM | POA: Diagnosis not present

## 2018-08-14 DIAGNOSIS — Z87891 Personal history of nicotine dependence: Secondary | ICD-10-CM | POA: Diagnosis not present

## 2018-08-14 DIAGNOSIS — J189 Pneumonia, unspecified organism: Secondary | ICD-10-CM | POA: Diagnosis not present

## 2018-08-14 DIAGNOSIS — Z7951 Long term (current) use of inhaled steroids: Secondary | ICD-10-CM | POA: Diagnosis not present

## 2018-08-14 DIAGNOSIS — J440 Chronic obstructive pulmonary disease with acute lower respiratory infection: Secondary | ICD-10-CM | POA: Diagnosis not present

## 2018-08-14 DIAGNOSIS — Z7952 Long term (current) use of systemic steroids: Secondary | ICD-10-CM | POA: Diagnosis not present

## 2018-08-16 DIAGNOSIS — Z7951 Long term (current) use of inhaled steroids: Secondary | ICD-10-CM | POA: Diagnosis not present

## 2018-08-16 DIAGNOSIS — J189 Pneumonia, unspecified organism: Secondary | ICD-10-CM | POA: Diagnosis not present

## 2018-08-16 DIAGNOSIS — G8929 Other chronic pain: Secondary | ICD-10-CM | POA: Diagnosis not present

## 2018-08-16 DIAGNOSIS — Z87891 Personal history of nicotine dependence: Secondary | ICD-10-CM | POA: Diagnosis not present

## 2018-08-16 DIAGNOSIS — Z7952 Long term (current) use of systemic steroids: Secondary | ICD-10-CM | POA: Diagnosis not present

## 2018-08-16 DIAGNOSIS — J440 Chronic obstructive pulmonary disease with acute lower respiratory infection: Secondary | ICD-10-CM | POA: Diagnosis not present

## 2018-08-16 DIAGNOSIS — Z86718 Personal history of other venous thrombosis and embolism: Secondary | ICD-10-CM | POA: Diagnosis not present

## 2018-08-16 DIAGNOSIS — Z7901 Long term (current) use of anticoagulants: Secondary | ICD-10-CM | POA: Diagnosis not present

## 2018-08-20 DIAGNOSIS — Z86718 Personal history of other venous thrombosis and embolism: Secondary | ICD-10-CM | POA: Diagnosis not present

## 2018-08-20 DIAGNOSIS — J189 Pneumonia, unspecified organism: Secondary | ICD-10-CM | POA: Diagnosis not present

## 2018-08-20 DIAGNOSIS — Z7951 Long term (current) use of inhaled steroids: Secondary | ICD-10-CM | POA: Diagnosis not present

## 2018-08-20 DIAGNOSIS — Z7952 Long term (current) use of systemic steroids: Secondary | ICD-10-CM | POA: Diagnosis not present

## 2018-08-20 DIAGNOSIS — J440 Chronic obstructive pulmonary disease with acute lower respiratory infection: Secondary | ICD-10-CM | POA: Diagnosis not present

## 2018-08-20 DIAGNOSIS — Z7901 Long term (current) use of anticoagulants: Secondary | ICD-10-CM | POA: Diagnosis not present

## 2018-08-20 DIAGNOSIS — G8929 Other chronic pain: Secondary | ICD-10-CM | POA: Diagnosis not present

## 2018-08-20 DIAGNOSIS — Z87891 Personal history of nicotine dependence: Secondary | ICD-10-CM | POA: Diagnosis not present

## 2018-08-24 DIAGNOSIS — Z7952 Long term (current) use of systemic steroids: Secondary | ICD-10-CM | POA: Diagnosis not present

## 2018-08-24 DIAGNOSIS — Z87891 Personal history of nicotine dependence: Secondary | ICD-10-CM | POA: Diagnosis not present

## 2018-08-24 DIAGNOSIS — J189 Pneumonia, unspecified organism: Secondary | ICD-10-CM | POA: Diagnosis not present

## 2018-08-24 DIAGNOSIS — J440 Chronic obstructive pulmonary disease with acute lower respiratory infection: Secondary | ICD-10-CM | POA: Diagnosis not present

## 2018-08-24 DIAGNOSIS — Z7951 Long term (current) use of inhaled steroids: Secondary | ICD-10-CM | POA: Diagnosis not present

## 2018-08-24 DIAGNOSIS — G8929 Other chronic pain: Secondary | ICD-10-CM | POA: Diagnosis not present

## 2018-08-24 DIAGNOSIS — Z86718 Personal history of other venous thrombosis and embolism: Secondary | ICD-10-CM | POA: Diagnosis not present

## 2018-08-24 DIAGNOSIS — Z7901 Long term (current) use of anticoagulants: Secondary | ICD-10-CM | POA: Diagnosis not present

## 2018-09-07 ENCOUNTER — Other Ambulatory Visit: Payer: Self-pay

## 2018-09-07 ENCOUNTER — Emergency Department (HOSPITAL_COMMUNITY): Payer: Medicare Other

## 2018-09-07 ENCOUNTER — Encounter (HOSPITAL_COMMUNITY): Payer: Self-pay | Admitting: Emergency Medicine

## 2018-09-07 ENCOUNTER — Inpatient Hospital Stay (HOSPITAL_COMMUNITY)
Admission: EM | Admit: 2018-09-07 | Discharge: 2018-09-13 | DRG: 193 | Disposition: A | Discharge status: Home Health | Payer: Medicare Other | Attending: Family Medicine | Admitting: Family Medicine

## 2018-09-07 DIAGNOSIS — J9621 Acute and chronic respiratory failure with hypoxia: Secondary | ICD-10-CM | POA: Diagnosis not present

## 2018-09-07 DIAGNOSIS — R739 Hyperglycemia, unspecified: Secondary | ICD-10-CM | POA: Diagnosis present

## 2018-09-07 DIAGNOSIS — F200 Paranoid schizophrenia: Secondary | ICD-10-CM | POA: Diagnosis not present

## 2018-09-07 DIAGNOSIS — D649 Anemia, unspecified: Secondary | ICD-10-CM | POA: Diagnosis not present

## 2018-09-07 DIAGNOSIS — E876 Hypokalemia: Secondary | ICD-10-CM | POA: Diagnosis present

## 2018-09-07 DIAGNOSIS — F419 Anxiety disorder, unspecified: Secondary | ICD-10-CM | POA: Diagnosis present

## 2018-09-07 DIAGNOSIS — K219 Gastro-esophageal reflux disease without esophagitis: Secondary | ICD-10-CM | POA: Diagnosis not present

## 2018-09-07 DIAGNOSIS — F988 Other specified behavioral and emotional disorders with onset usually occurring in childhood and adolescence: Secondary | ICD-10-CM | POA: Diagnosis present

## 2018-09-07 DIAGNOSIS — Z8709 Personal history of other diseases of the respiratory system: Secondary | ICD-10-CM

## 2018-09-07 DIAGNOSIS — J449 Chronic obstructive pulmonary disease, unspecified: Secondary | ICD-10-CM | POA: Diagnosis not present

## 2018-09-07 DIAGNOSIS — G8929 Other chronic pain: Secondary | ICD-10-CM | POA: Diagnosis present

## 2018-09-07 DIAGNOSIS — Z7901 Long term (current) use of anticoagulants: Secondary | ICD-10-CM

## 2018-09-07 DIAGNOSIS — R05 Cough: Secondary | ICD-10-CM

## 2018-09-07 DIAGNOSIS — F1111 Opioid abuse, in remission: Secondary | ICD-10-CM | POA: Diagnosis present

## 2018-09-07 DIAGNOSIS — R0602 Shortness of breath: Secondary | ICD-10-CM

## 2018-09-07 DIAGNOSIS — R0789 Other chest pain: Secondary | ICD-10-CM

## 2018-09-07 DIAGNOSIS — G894 Chronic pain syndrome: Secondary | ICD-10-CM | POA: Diagnosis not present

## 2018-09-07 DIAGNOSIS — Z20828 Contact with and (suspected) exposure to other viral communicable diseases: Secondary | ICD-10-CM | POA: Diagnosis present

## 2018-09-07 DIAGNOSIS — F319 Bipolar disorder, unspecified: Secondary | ICD-10-CM | POA: Diagnosis not present

## 2018-09-07 DIAGNOSIS — Y95 Nosocomial condition: Secondary | ICD-10-CM | POA: Diagnosis present

## 2018-09-07 DIAGNOSIS — Z86718 Personal history of other venous thrombosis and embolism: Secondary | ICD-10-CM

## 2018-09-07 DIAGNOSIS — D75839 Thrombocytosis, unspecified: Secondary | ICD-10-CM | POA: Diagnosis present

## 2018-09-07 DIAGNOSIS — Z87891 Personal history of nicotine dependence: Secondary | ICD-10-CM

## 2018-09-07 DIAGNOSIS — J440 Chronic obstructive pulmonary disease with acute lower respiratory infection: Secondary | ICD-10-CM | POA: Diagnosis present

## 2018-09-07 DIAGNOSIS — J189 Pneumonia, unspecified organism: Principal | ICD-10-CM

## 2018-09-07 DIAGNOSIS — Z885 Allergy status to narcotic agent status: Secondary | ICD-10-CM

## 2018-09-07 DIAGNOSIS — J441 Chronic obstructive pulmonary disease with (acute) exacerbation: Secondary | ICD-10-CM

## 2018-09-07 DIAGNOSIS — F329 Major depressive disorder, single episode, unspecified: Secondary | ICD-10-CM | POA: Diagnosis present

## 2018-09-07 DIAGNOSIS — Z9981 Dependence on supplemental oxygen: Secondary | ICD-10-CM

## 2018-09-07 DIAGNOSIS — Z886 Allergy status to analgesic agent status: Secondary | ICD-10-CM | POA: Diagnosis not present

## 2018-09-07 DIAGNOSIS — R52 Pain, unspecified: Secondary | ICD-10-CM

## 2018-09-07 DIAGNOSIS — J8410 Pulmonary fibrosis, unspecified: Secondary | ICD-10-CM | POA: Diagnosis not present

## 2018-09-07 DIAGNOSIS — J9622 Acute and chronic respiratory failure with hypercapnia: Secondary | ICD-10-CM | POA: Diagnosis not present

## 2018-09-07 DIAGNOSIS — D473 Essential (hemorrhagic) thrombocythemia: Secondary | ICD-10-CM | POA: Diagnosis present

## 2018-09-07 DIAGNOSIS — R0781 Pleurodynia: Secondary | ICD-10-CM

## 2018-09-07 DIAGNOSIS — R059 Cough, unspecified: Secondary | ICD-10-CM

## 2018-09-07 LAB — CBC WITH DIFFERENTIAL/PLATELET
Abs Immature Granulocytes: 0.05 10*3/uL (ref 0.00–0.07)
Basophils Absolute: 0.1 10*3/uL (ref 0.0–0.1)
Basophils Relative: 1 %
Eosinophils Absolute: 1.2 10*3/uL — ABNORMAL HIGH (ref 0.0–0.5)
Eosinophils Relative: 9 %
HCT: 36.5 % (ref 36.0–46.0)
Hemoglobin: 11.6 g/dL — ABNORMAL LOW (ref 12.0–15.0)
Immature Granulocytes: 0 %
Lymphocytes Relative: 30 %
Lymphs Abs: 4 10*3/uL (ref 0.7–4.0)
MCH: 31.2 pg (ref 26.0–34.0)
MCHC: 31.8 g/dL (ref 30.0–36.0)
MCV: 98.1 fL (ref 80.0–100.0)
Monocytes Absolute: 1.2 10*3/uL — ABNORMAL HIGH (ref 0.1–1.0)
Monocytes Relative: 9 %
Neutro Abs: 6.6 10*3/uL (ref 1.7–7.7)
Neutrophils Relative %: 51 %
Platelets: 535 10*3/uL — ABNORMAL HIGH (ref 150–400)
RBC: 3.72 MIL/uL — ABNORMAL LOW (ref 3.87–5.11)
RDW: 14.6 % (ref 11.5–15.5)
WBC: 13.1 10*3/uL — ABNORMAL HIGH (ref 4.0–10.5)
nRBC: 0 % (ref 0.0–0.2)

## 2018-09-07 LAB — COMPREHENSIVE METABOLIC PANEL
ALT: 10 U/L (ref 0–44)
AST: 21 U/L (ref 15–41)
Albumin: 2.8 g/dL — ABNORMAL LOW (ref 3.5–5.0)
Alkaline Phosphatase: 123 U/L (ref 38–126)
Anion gap: 10 (ref 5–15)
BUN: 7 mg/dL (ref 6–20)
CO2: 26 mmol/L (ref 22–32)
Calcium: 8.8 mg/dL — ABNORMAL LOW (ref 8.9–10.3)
Chloride: 106 mmol/L (ref 98–111)
Creatinine, Ser: 0.52 mg/dL (ref 0.44–1.00)
GFR calc Af Amer: >60 mL/min (ref >60)
GFR calc non Af Amer: >60 mL/min (ref >60)
Glucose, Bld: 93 mg/dL (ref 70–99)
Potassium: 3 mmol/L — ABNORMAL LOW (ref 3.5–5.1)
Sodium: 142 mmol/L (ref 135–145)
Total Bilirubin: 0.4 mg/dL (ref 0.3–1.2)
Total Protein: 6.6 g/dL (ref 6.5–8.1)

## 2018-09-07 LAB — PROCALCITONIN: Procalcitonin: 10.58 ng/mL

## 2018-09-07 LAB — BRAIN NATRIURETIC PEPTIDE: B Natriuretic Peptide: 69 pg/mL (ref 0.0–100.0)

## 2018-09-07 LAB — TROPONIN I: Troponin I: <0.03 ng/mL (ref <0.03)

## 2018-09-07 LAB — SARS CORONAVIRUS 2 BY RT PCR (HOSPITAL ORDER, PERFORMED IN ~~LOC~~ HOSPITAL LAB): SARS Coronavirus 2: NEGATIVE

## 2018-09-07 MED ORDER — IPRATROPIUM-ALBUTEROL 0.5-2.5 (3) MG/3ML IN SOLN
3.0000 mL | Freq: Four times a day (QID) | RESPIRATORY_TRACT | Status: DC
  Administered 2018-09-07 – 2018-09-11 (×12): 3 mL via RESPIRATORY_TRACT
  Filled 2018-09-07 (×15): qty 3

## 2018-09-07 MED ORDER — PIPERACILLIN-TAZOBACTAM 3.375 G IVPB
3.3750 g | Freq: Once | INTRAVENOUS | Status: AC
  Administered 2018-09-07: 18:00:00 3.375 g via INTRAVENOUS
  Filled 2018-09-07: qty 50

## 2018-09-07 MED ORDER — PREDNISONE 20 MG PO TABS: 40.0000 mg | ORAL_TABLET | Freq: Every day | ORAL | Status: DC

## 2018-09-07 MED ORDER — APIXABAN 5 MG PO TABS
5.0000 mg | ORAL_TABLET | Freq: Two times a day (BID) | ORAL | Status: DC
  Administered 2018-09-07 – 2018-09-13 (×12): 5 mg via ORAL
  Filled 2018-09-07 (×14): qty 1

## 2018-09-07 MED ORDER — METHYLPREDNISOLONE SODIUM SUCC 125 MG IJ SOLR
60.0000 mg | Freq: Four times a day (QID) | INTRAMUSCULAR | Status: DC
  Administered 2018-09-07 – 2018-09-08 (×2): 60 mg via INTRAVENOUS
  Filled 2018-09-07 (×2): qty 2

## 2018-09-07 MED ORDER — GUAIFENESIN ER 600 MG PO TB12
600.0000 mg | ORAL_TABLET | Freq: Two times a day (BID) | ORAL | Status: DC
  Administered 2018-09-07: 600 mg via ORAL
  Filled 2018-09-07 (×2): qty 1

## 2018-09-07 MED ORDER — VANCOMYCIN HCL 500 MG IV SOLR
500.0000 mg | Freq: Two times a day (BID) | INTRAVENOUS | Status: DC
  Administered 2018-09-08: 500 mg via INTRAVENOUS
  Filled 2018-09-07 (×5): qty 500

## 2018-09-07 MED ORDER — FLUTICASONE-UMECLIDIN-VILANT 100-62.5-25 MCG/INH IN AEPB: 1.0000 | INHALATION_SPRAY | Freq: Every day | RESPIRATORY_TRACT | Status: DC

## 2018-09-07 MED ORDER — FLUOXETINE HCL 20 MG PO CAPS
40.0000 mg | ORAL_CAPSULE | Freq: Every day | ORAL | Status: DC
  Administered 2018-09-08 – 2018-09-13 (×6): 40 mg via ORAL
  Filled 2018-09-07 (×6): qty 2

## 2018-09-07 MED ORDER — BENZONATATE 100 MG PO CAPS
200.0000 mg | ORAL_CAPSULE | Freq: Once | ORAL | Status: AC
  Administered 2018-09-07: 200 mg via ORAL
  Filled 2018-09-07: qty 2

## 2018-09-07 MED ORDER — FLUTICASONE FUROATE-VILANTEROL 100-25 MCG/INH IN AEPB
1.0000 | INHALATION_SPRAY | Freq: Every day | RESPIRATORY_TRACT | Status: DC
  Administered 2018-09-08 – 2018-09-13 (×6): 1 via RESPIRATORY_TRACT
  Filled 2018-09-07: qty 28

## 2018-09-07 MED ORDER — HYDROCODONE-ACETAMINOPHEN 5-325 MG PO TABS
1.0000 | ORAL_TABLET | Freq: Four times a day (QID) | ORAL | Status: DC | PRN
  Administered 2018-09-07 – 2018-09-08 (×2): 1 via ORAL
  Filled 2018-09-07 (×2): qty 1

## 2018-09-07 MED ORDER — ALBUTEROL SULFATE HFA 108 (90 BASE) MCG/ACT IN AERS
4.0000 | INHALATION_SPRAY | RESPIRATORY_TRACT | Status: DC | PRN
  Administered 2018-09-07: 4 via RESPIRATORY_TRACT
  Filled 2018-09-07: qty 6.7

## 2018-09-07 MED ORDER — UMECLIDINIUM BROMIDE 62.5 MCG/INH IN AEPB
1.0000 | INHALATION_SPRAY | Freq: Every day | RESPIRATORY_TRACT | Status: DC
  Administered 2018-09-08 – 2018-09-13 (×6): 1 via RESPIRATORY_TRACT
  Filled 2018-09-07: qty 7

## 2018-09-07 MED ORDER — METHYLPREDNISOLONE SODIUM SUCC 125 MG IJ SOLR
125.0000 mg | Freq: Once | INTRAMUSCULAR | Status: AC
  Administered 2018-09-07: 125 mg via INTRAVENOUS
  Filled 2018-09-07: qty 2

## 2018-09-07 MED ORDER — ENSURE ENLIVE PO LIQD: 237.0000 mL | Freq: Three times a day (TID) | ORAL | Status: DC

## 2018-09-07 MED ORDER — ALBUTEROL SULFATE (2.5 MG/3ML) 0.083% IN NEBU
2.5000 mg | INHALATION_SOLUTION | RESPIRATORY_TRACT | Status: DC | PRN
  Administered 2018-09-10: 5 mg via RESPIRATORY_TRACT
  Filled 2018-09-07: qty 3

## 2018-09-07 MED ORDER — MORPHINE SULFATE (PF) 4 MG/ML IV SOLN
4.0000 mg | Freq: Once | INTRAVENOUS | Status: AC
  Administered 2018-09-07: 4 mg via INTRAVENOUS
  Filled 2018-09-07: qty 1

## 2018-09-07 MED ORDER — PANTOPRAZOLE SODIUM 40 MG PO TBEC
40.0000 mg | DELAYED_RELEASE_TABLET | Freq: Two times a day (BID) | ORAL | Status: DC
  Administered 2018-09-07 – 2018-09-13 (×11): 40 mg via ORAL
  Filled 2018-09-07 (×13): qty 1

## 2018-09-07 MED ORDER — VANCOMYCIN HCL IN DEXTROSE 1-5 GM/200ML-% IV SOLN
1000.0000 mg | Freq: Once | INTRAVENOUS | Status: AC
  Administered 2018-09-07: 1000 mg via INTRAVENOUS
  Filled 2018-09-07: qty 200

## 2018-09-07 MED ORDER — SODIUM CHLORIDE 0.9 % IV SOLN
1.0000 g | Freq: Three times a day (TID) | INTRAVENOUS | Status: DC
  Administered 2018-09-07 – 2018-09-08 (×2): 1 g via INTRAVENOUS
  Filled 2018-09-07 (×2): qty 1

## 2018-09-07 MED ORDER — GUAIFENESIN-DM 100-10 MG/5ML PO SYRP
5.0000 mL | ORAL_SOLUTION | ORAL | Status: DC | PRN
  Administered 2018-09-07 – 2018-09-09 (×7): 5 mL via ORAL
  Filled 2018-09-07 (×7): qty 5

## 2018-09-07 MED ORDER — PNEUMOCOCCAL VAC POLYVALENT 25 MCG/0.5ML IJ INJ: 0.5000 mL | INJECTION | INTRAMUSCULAR | Status: DC

## 2018-09-07 NOTE — ED Provider Notes (Signed)
[facility] EMERGENCY DEPARTMENT Provider Note   CSN: [id number] Arrival date & time: 09/07/18  1353    History   Chief Complaint Chief Complaint  Patient presents with  . Shortness of Breath    HPI Christine Ortega is a 58 y.o. female.     Patient is a 58 year old female with past medical history of recurrent pneumonia on home oxygen.  She presents today for evaluation of cough, difficulty breathing, and burning in her chest.  Patient has been admitted twice this year for pneumonia and this feels the same.  She denies fever at home.  Her cough has been intermittently productive.  The history is provided by the patient.  Shortness of Breath  Severity:  Moderate Onset quality:  Gradual Duration:  3 days Timing:  Constant Progression:  Worsening Chronicity:  Recurrent Context: activity and URI   Relieved by:  Nothing Worsened by:  Nothing Ineffective treatments:  None tried Associated symptoms: sputum production   Associated symptoms: no fever     Past Medical History:  Diagnosis Date  . ADD (attention deficit disorder)   . Anxiety   . Bipolar disorder ([place])   . Chronic pain   . Depression   . Narcotic abuse in remission ([place])   . Schizophrenia ([place])   . Seizures [facility])     Patient Active Problem List   Diagnosis Date Noted  . HCAP (healthcare-associated pneumonia) 08/01/2018  . Acute hypoxemic respiratory failure ([place]) 07/27/2018  . Bowel perforation ([place])   . Acute respiratory failure ([place])   . Upper GI bleed   . Sepsis due to undetermined organism ([place]) 05/19/2018  . Acute blood loss anemia 05/19/2018  . Community acquired pneumonia 05/19/2018  . Thrombocytosis ([place]) 05/19/2018  . Hypokalemia   . Schizophrenia, paranoid type ([place]) 08/08/2011  . Narcotic abuse in remission ([place])   . Bipolar disorder ([place])   . Chronic pain   . ADD (attention deficit disorder)     Past Surgical History:  Procedure Laterality Date  . BACK SURGERY    .  ESOPHAGOGASTRODUODENOSCOPY N/A 05/19/2018   Procedure: ESOPHAGOGASTRODUODENOSCOPY (EGD);  Surgeon: Yetta Flock, MD;  Location: [facility] ENDOSCOPY;  Service: Gastroenterology;  Laterality: N/A;  egd at bedside in ICU, intubated  . IR ANGIOGRAM SELECTIVE EACH ADDITIONAL VESSEL  05/20/2018  . IR ANGIOGRAM SELECTIVE EACH ADDITIONAL VESSEL  05/20/2018  . IR ANGIOGRAM VISCERAL SELECTIVE  05/20/2018  . IR ANGIOGRAM VISCERAL SELECTIVE  05/20/2018  . IR EMBO ART  VEN HEMORR LYMPH EXTRAV  INC GUIDE ROADMAPPING  05/20/2018  . IR US GUIDE VASC ACCESS LEFT  05/20/2018     OB History   No obstetric history on file.      Home Medications    Prior to Admission medications   Medication Sig Start Date End Date Taking? Authorizing Provider  acetaminophen (TYLENOL) 500 MG tablet Take 1 tablet (500 mg total) by mouth every 8 (eight) hours as needed for moderate pain. 06/08/18 06/08/19 Yes Thurnell Lose, MD  apixaban (ELIQUIS) 5 MG TABS tablet Take 1 tablet (5 mg total) by mouth 2 (two) times daily. 2 pills mouth twice a day till 06/12/2018 then switch to 1 pill twice a day from 06/13/2018 Patient taking differently: Take 10 mg by mouth 2 (two) times daily.  06/08/18  Yes Thurnell Lose, MD  cetirizine (ZYRTEC) 10 MG tablet Take 10 mg by mouth daily.   Yes [provider]  FLUoxetine (PROZAC) 40 MG capsule Take 1 capsule (40  mg total) by mouth daily. For depression and anxiety. 08/08/11  Yes Mashburn, Marlane Hatcher, PA-C  Fluticasone-Umeclidin-Vilant (TRELEGY ELLIPTA) 100-62.5-25 MCG/INH AEPB Inhale 1 puff into the lungs daily.   Yes [provider]  guaiFENesin (MUCINEX) 600 MG 12 hr tablet Take 800 mg by mouth 2 (two) times daily.    Yes [provider]  Melatonin 5 MG CAPS Take 20 mg by mouth at bedtime.    Yes [provider]  pantoprazole (PROTONIX) 40 MG tablet Take 1 tablet (40 mg total) by mouth 2 (two) times daily. 06/08/18  Yes Thurnell Lose, MD  Probiotic Product (PROBIOTIC  PO) Take 1 tablet by mouth daily. Women's probiotic for GI health   Yes [provider]  lactose free nutrition (BOOST PLUS) LIQD Take 237 mLs by mouth 3 (three) times daily with meals. Patient not taking: Reported on 07/27/2018 06/08/18   Thurnell Lose, MD  metoprolol tartrate (LOPRESSOR) 25 MG tablet Take 1 tablet (25 mg total) by mouth 2 (two) times daily. Patient not taking: Reported on 07/27/2018 06/08/18   Thurnell Lose, MD  ziprasidone (GEODON) 80 MG capsule Take one capsule at bedtime for depression and mood stability. Patient not taking: Reported on 07/27/2018 08/08/11   Pamelia Hoit    Family History No family history on file.  Social History Social History   Tobacco Use  . Smoking status: Former Smoker    Packs/day: 0.50  . Smokeless tobacco: Never Used  Substance Use Topics  . Alcohol use: No  . Drug use: No     Allergies   Nsaids and Tramadol hcl   Review of Systems Review of Systems  Constitutional: Negative for fever.  Respiratory: Positive for sputum production and shortness of breath.   All other systems reviewed and are negative.    Physical Exam Updated Vital Signs BP (!) 133/117   Pulse 100   Temp 97.7 F (36.5 C) (Tympanic)   Resp (!) 27   Ht 5\' 3"  (1.6 m)   Wt 54.4 kg   SpO2 100%   BMI 21.26 kg/m   Physical Exam Vitals signs and nursing note reviewed.  Constitutional:      General: She is not in acute distress.    Appearance: She is well-developed. She is not diaphoretic.     Comments: Patient is a chronically ill-appearing female.  She appears anxious and is hyperventilating/coughing.  HENT:     Head: Normocephalic and atraumatic.  Neck:     Musculoskeletal: Normal range of motion and neck supple.  Cardiovascular:     Rate and Rhythm: Normal rate and regular rhythm.     Heart sounds: No murmur. No friction rub. No gallop.   Pulmonary:     Effort: Pulmonary effort is normal. No respiratory distress.     Breath  sounds: Examination of the right-middle field reveals rhonchi. Examination of the left-middle field reveals rhonchi. Rhonchi present. No wheezing.  Abdominal:     General: Bowel sounds are normal. There is no distension.     Palpations: Abdomen is soft.     Tenderness: There is no abdominal tenderness.  Musculoskeletal: Normal range of motion.     Right lower leg: She exhibits no tenderness. No edema.     Left lower leg: She exhibits no tenderness. No edema.  Skin:    General: Skin is warm and dry.  Neurological:     Mental Status: She is alert and oriented to person, place, and time.  ED Treatments / Results  Labs (all labs ordered are listed, but only abnormal results are displayed) Labs Reviewed  COMPREHENSIVE METABOLIC PANEL - Abnormal; Notable for the following components:      Result Value   Potassium 3.0 (*)    Calcium 8.8 (*)    Albumin 2.8 (*)    All other components within normal limits  CBC WITH DIFFERENTIAL/PLATELET - Abnormal; Notable for the following components:   WBC 13.1 (*)    RBC 3.72 (*)    Hemoglobin 11.6 (*)    Platelets 535 (*)    Monocytes Absolute 1.2 (*)    Eosinophils Absolute 1.2 (*)    All other components within normal limits  SARS CORONAVIRUS 2 (HOSPITAL ORDER, [facility])  TROPONIN I  BRAIN NATRIURETIC PEPTIDE    EKG EKG Interpretation  Date/Time:  Friday September 07 2018 14:41:25 EDT Ventricular Rate:  108 PR Interval:    QRS Duration: 83 QT Interval:  330 QTC Calculation: 443 R Axis:   55 Text Interpretation:  Sinus tachycardia Baseline wander in lead(s) V6 Confirmed by Veryl Speak [phone]) on 09/07/2018 4:46:29 PM   Radiology Dg Chest Port 1 View  Result Date: 09/07/2018 CLINICAL DATA:  Cough with shortness of breath. EXAM: PORTABLE CHEST 1 VIEW COMPARISON:  08/01/2018. FINDINGS: The heart is enlarged. On a background of chronic interstitial lung disease, there is asymmetric increased opacity in the  RIGHT mid and lower lung zones which could represent infection or edema. No effusion or pneumothorax. Bones unremarkable. IMPRESSION: Cardiomegaly. Asymmetric increased opacity in the RIGHT mid and lower lung zones which could represent infection or edema. Worsening aeration. Chronic interstitial lung disease. Correlate clinically for pulmonary edema versus early RIGHT lung infiltrate. Electronically Signed   By: Staci Righter M.D.   On: 09/07/2018 17:14    Procedures Procedures (including critical care time)  Medications Ordered in ED Medications  albuterol (VENTOLIN HFA) 108 (90 Base) MCG/ACT inhaler 4 puff (4 puffs Inhalation Given 09/07/18 1644)  vancomycin (VANCOCIN) IVPB 1000 mg/200 mL premix (has no administration in time range)  piperacillin-tazobactam (ZOSYN) IVPB 3.375 g (has no administration in time range)  morphine 4 MG/ML injection 4 mg (4 mg Intravenous Given 09/07/18 1632)  methylPREDNISolone sodium succinate (SOLU-MEDROL) 125 mg/2 mL injection 125 mg (125 mg Intravenous Given 09/07/18 1632)     Initial Impression / Assessment and Plan / ED Course  I have reviewed the triage vital signs and the nursing notes.  Pertinent labs & imaging results that were available during my care of the patient were reviewed by me and considered in my medical decision making (see chart for details).  Patient presenting with shortness of breath and cough.  She has history of recurrent pneumonia with pulmonary fibrosis.  Patient is on home oxygen.  Her chest x-ray today shows a possible developing infiltrates.  She has a slight white count, but no fever.  Patient does appear dyspneic with an increased oxygen requirement.  She will be admitted to the hospitalist service under the care of Dr. Nehemiah Settle.  Patient given vancomycin and Zosyn for H CAP.  COVID-19 swab negative.  Final Clinical Impressions(s) / ED Diagnoses   Final diagnoses:  Cough  SOB (shortness of breath)    ED Discharge Orders    None        Veryl Speak, MD 09/07/18 1805

## 2018-09-07 NOTE — ED Notes (Signed)
Report given to Jenny RN on 300 

## 2018-09-07 NOTE — ED Notes (Signed)
ED TO INPATIENT HANDOFF REPORT  ED Nurse Name and Phone #:  Fabio Neighbors RN [phone]  S Name/Age/Gender Christine Ortega 58 y.o. female Room/Bed: APA19/APA19  Code Status   Code Status: Prior  Home/SNF/Other Home Patient oriented to: self, place, time and situation Is this baseline? Yes   Triage Complete: Triage complete  Chief Complaint trouble breathing/ getting worse/pain   Triage Note Pt states that she has had pneumonia twice this year she states that it is hard to breath she is coughing she gets sob just talking. She states that it hurts to breath.    Allergies Allergies  Allergen Reactions  . Nsaids Other (See Comments)    Causes GI bleeding  . Tramadol Hcl     Per Daughter- patient has " sleep paralysis"     Level of Care/Admitting Diagnosis ED Disposition    ED Disposition Condition [place]: [facility] [[id number]]  Level of Care: Telemetry [5]  Covid Evaluation: Confirmed COVID Negative  Diagnosis: Acute on chronic respiratory failure with hypoxia [facility]) [[id number]]  Admitting Physician: Truett Mainland [[id number]]  Attending Physician: Truett Mainland [[id number]]  Estimated length of stay: 3 - 4 days  Certification:: I certify this patient will need inpatient services for at least 2 midnights  PT Class (Do Not Modify): Inpatient [101]  PT Acc Code (Do Not Modify): Private [1]       B Medical/Surgery History Past Medical History:  Diagnosis Date  . ADD (attention deficit disorder)   . Anxiety   . Bipolar disorder ([place])   . Chronic pain   . Depression   . Narcotic abuse in remission ([place])   . Schizophrenia ([place])   . Seizures ([place])    Past Surgical History:  Procedure Laterality Date  . BACK SURGERY    . ESOPHAGOGASTRODUODENOSCOPY N/A 05/19/2018   Procedure: ESOPHAGOGASTRODUODENOSCOPY (EGD);  Surgeon: Yetta Flock, MD;  Location: [facility] ENDOSCOPY;  Service: Gastroenterology;  Laterality: N/A;  egd at bedside in ICU,  intubated  . IR ANGIOGRAM SELECTIVE EACH ADDITIONAL VESSEL  05/20/2018  . IR ANGIOGRAM SELECTIVE EACH ADDITIONAL VESSEL  05/20/2018  . IR ANGIOGRAM VISCERAL SELECTIVE  05/20/2018  . IR ANGIOGRAM VISCERAL SELECTIVE  05/20/2018  . IR EMBO ART  VEN HEMORR LYMPH EXTRAV  INC GUIDE ROADMAPPING  05/20/2018  . IR US GUIDE VASC ACCESS LEFT  05/20/2018     A IV Location/Drains/Wounds Patient Lines/Drains/Airways Status   Active Line/Drains/Airways    Name:   Placement date:   Placement time:   Site:   Days:   Peripheral IV 09/07/18 Left Antecubital   09/07/18    1530    Antecubital   less than 1          Intake/Output Last 24 hours No intake or output data in the 24 hours ending 09/07/18 2001  Labs/Imaging Results for orders placed or performed during the hospital encounter of 09/07/18 (from the past 48 hour(s))  Comprehensive metabolic panel     Status: Abnormal   Collection Time: 09/07/18  3:56 PM  Result Value Ref Range   Sodium 142 135 - 145 mmol/L   Potassium 3.0 (L) 3.5 - 5.1 mmol/L   Chloride 106 98 - 111 mmol/L   CO2 26 22 - 32 mmol/L   Glucose, Bld 93 70 - 99 mg/dL   BUN 7 6 - 20 mg/dL   Creatinine, Ser 0.52 0.44 - 1.00 mg/dL   Calcium 8.8 (L) 8.9 - 10.3  mg/dL   Total Protein 6.6 6.5 - 8.1 g/dL   Albumin 2.8 (L) 3.5 - 5.0 g/dL   AST 21 15 - 41 U/L   ALT 10 0 - 44 U/L   Alkaline Phosphatase 123 38 - 126 U/L   Total Bilirubin 0.4 0.3 - 1.2 mg/dL   GFR calc non Af Amer >60 >60 mL/min   GFR calc Af Amer >60 >60 mL/min   Anion gap 10 5 - 15    Comment: Performed at [facility], [address], [place], [place] [address]  CBC with Differential     Status: Abnormal   Collection Time: 09/07/18  3:56 PM  Result Value Ref Range   WBC 13.1 (H) 4.0 - 10.5 K/uL   RBC 3.72 (L) 3.87 - 5.11 MIL/uL   Hemoglobin 11.6 (L) 12.0 - 15.0 g/dL   HCT 36.5 36.0 - 46.0 %   MCV 98.1 80.0 - 100.0 fL   MCH 31.2 26.0 - 34.0 pg   MCHC 31.8 30.0 - 36.0 g/dL   RDW 14.6 11.5 - 15.5 %   Platelets 535  (H) 150 - 400 K/uL   nRBC 0.0 0.0 - 0.2 %   Neutrophils Relative % 51 %   Neutro Abs 6.6 1.7 - 7.7 K/uL   Lymphocytes Relative 30 %   Lymphs Abs 4.0 0.7 - 4.0 K/uL   Monocytes Relative 9 %   Monocytes Absolute 1.2 (H) 0.1 - 1.0 K/uL   Eosinophils Relative 9 %   Eosinophils Absolute 1.2 (H) 0.0 - 0.5 K/uL   Basophils Relative 1 %   Basophils Absolute 0.1 0.0 - 0.1 K/uL   Immature Granulocytes 0 %   Abs Immature Granulocytes 0.05 0.00 - 0.07 K/uL    Comment: Performed at [facility], [address] [place], [place] [address]  Troponin I - Once     Status: None   Collection Time: 09/07/18  3:56 PM  Result Value Ref Range   Troponin I <0.03 <0.03 ng/mL    Comment: Performed at [facility], [address] [place], [place] [address]  Brain natriuretic peptide     Status: None   Collection Time: 09/07/18  3:56 PM  Result Value Ref Range   B Natriuretic Peptide 69.0 0.0 - 100.0 pg/mL    Comment: Performed at [facility], [address], [place], [place] [address]  SARS Coronavirus 2 (CEPHEID- Performed in [place] hospital lab), Hosp Order     Status: None   Collection Time: 09/07/18  3:56 PM  Result Value Ref Range   SARS Coronavirus 2 NEGATIVE NEGATIVE    Comment: (NOTE) If result is NEGATIVE SARS-CoV-2 target nucleic acids are NOT DETECTED. The SARS-CoV-2 RNA is generally detectable in upper and lower  respiratory specimens during the acute phase of infection. The lowest  concentration of SARS-CoV-2 viral copies this assay can detect is 250  copies / mL. A negative result does not preclude SARS-CoV-2 infection  and should not be used as the sole basis for treatment or other  patient management decisions.  A negative result may occur with  improper specimen collection / handling, submission of specimen other  than nasopharyngeal swab, presence of viral mutation(s) within the  areas targeted by this assay, and inadequate number of viral copies  (<250 copies / mL). A negative  result must be combined with clinical  observations, patient history, and epidemiological information. If result is POSITIVE SARS-CoV-2 target nucleic acids are DETECTED. The SARS-CoV-2 RNA is generally detectable  in upper and lower  respiratory specimens dur ing the acute phase of infection.  Positive  results are indicative of active infection with SARS-CoV-2.  Clinical  correlation with patient history and other diagnostic information is  necessary to determine patient infection status.  Positive results do  not rule out bacterial infection or co-infection with other viruses. If result is PRESUMPTIVE POSTIVE SARS-CoV-2 nucleic acids MAY BE PRESENT.   A presumptive positive result was obtained on the submitted specimen  and confirmed on repeat testing.  While 2019 novel coronavirus  (SARS-CoV-2) nucleic acids may be present in the submitted sample  additional confirmatory testing may be necessary for epidemiological  and / or clinical management purposes  to differentiate between  SARS-CoV-2 and other Sarbecovirus currently known to infect humans.  If clinically indicated additional testing with an alternate test  methodology [phone]) is advised. The SARS-CoV-2 RNA is generally  detectable in upper and lower respiratory sp ecimens during the acute  phase of infection. The expected result is Negative. Fact Sheet for Patients:  StrictlyIdeas.no Fact Sheet for Healthcare Providers: BankingDealers.co.za This test is not yet approved or cleared by the [place] FDA and has been authorized for detection and/or diagnosis of SARS-CoV-2 by FDA under an Emergency Use Authorization (EUA).  This EUA will remain in effect (meaning this test can be used) for the duration of the COVID-19 declaration under Section 564(b)(1) of the Act, 21 U.S.C. section 360bbb-3(b)(1), unless the authorization is terminated or revoked sooner. Performed at [facility], [address] [place], [place] [address]   Procalcitonin - Baseline     Status: None   Collection Time: 09/07/18  3:56 PM  Result Value Ref Range   Procalcitonin 10.58 ng/mL    Comment:        Interpretation: PCT >= 10 ng/mL: Important systemic inflammatory response, almost exclusively due to severe bacterial sepsis or septic shock. (NOTE)       Sepsis PCT Algorithm           Lower Respiratory Tract                                      Infection PCT Algorithm    ----------------------------     ----------------------------         PCT < 0.25 ng/mL                PCT < 0.10 ng/mL         Strongly encourage             Strongly discourage   discontinuation of antibiotics    initiation of antibiotics    ----------------------------     -----------------------------       PCT 0.25 - 0.50 ng/mL            PCT 0.10 - 0.25 ng/mL               OR       >80% decrease in PCT            Discourage initiation of                                            antibiotics      Encourage discontinuation  of antibiotics    ----------------------------     -----------------------------         PCT >= 0.50 ng/mL              PCT 0.26 - 0.50 ng/mL                AND       <80% decrease in PCT             Encourage initiation of                                             antibiotics       Encourage continuation           of antibiotics    ----------------------------     -----------------------------        PCT >= 0.50 ng/mL                  PCT > 0.50 ng/mL               AND         increase in PCT                  Strongly encourage                                      initiation of antibiotics    Strongly encourage escalation           of antibiotics                                     -----------------------------                                           PCT <= 0.25 ng/mL                                                 OR                                        > 80%  decrease in PCT                                     Discontinue / Do not initiate                                             antibiotics Performed at [facility], [address] [place], [place] [address]    Dg Chest Port 1 View  Result Date: 09/07/2018 CLINICAL DATA:  Cough with shortness of breath. EXAM: PORTABLE CHEST 1 VIEW COMPARISON:  08/01/2018. FINDINGS: The heart is enlarged. On a  background of chronic interstitial lung disease, there is asymmetric increased opacity in the RIGHT mid and lower lung zones which could represent infection or edema. No effusion or pneumothorax. Bones unremarkable. IMPRESSION: Cardiomegaly. Asymmetric increased opacity in the RIGHT mid and lower lung zones which could represent infection or edema. Worsening aeration. Chronic interstitial lung disease. Correlate clinically for pulmonary edema versus early RIGHT lung infiltrate. Electronically Signed   By: Staci Righter M.D.   On: 09/07/2018 17:14    Pending Labs Unresulted Labs (From admission, onward)    Start     Ordered   09/08/18 0500  Procalcitonin  Daily,   R     09/07/18 1852   09/07/18 1853  Respiratory Panel by PCR  (Respiratory virus panel with precautions)  Add-on,   R     09/07/18 1852   09/07/18 1853  MRSA PCR Screening  Once,   R     09/07/18 1852   Signed and Held  Culture, blood (routine x 2) Call MD if unable to obtain prior to antibiotics being given  BLOOD CULTURE X 2,   R    Comments:  If blood cultures drawn in Emergency Department - Do not draw and cancel order    Signed and Held   Signed and Held  Culture, sputum-assessment  Once,   R     Signed and Held   Signed and Held  Gram stain  Once,   R     Signed and Held   Signed and Held  Strep pneumoniae urinary antigen  Once,   R     Signed and Held   Signed and Held  HIV antibody  Once,   R     Signed and Held   Signed and Held  Basic metabolic panel  Tomorrow morning,   R     Signed and Held   Signed and Held  CBC  Tomorrow  morning,   R     Signed and Held          Vitals/Pain Today's Vitals   09/07/18 1644 09/07/18 1700 09/07/18 1730 09/07/18 1800  BP:  (!) 139/99 (!) 137/95 128/90  Pulse:  100 (!) 101 100  Resp:  (!) 24 (!) 23 (!) 27  Temp:      TempSrc:      SpO2: 100% 100% 100% 100%  Weight:      Height:      PainSc:  7   9     Isolation Precautions Droplet precaution  Medications Medications  albuterol (VENTOLIN HFA) 108 (90 Base) MCG/ACT inhaler 4 puff (4 puffs Inhalation Given 09/07/18 1644)  piperacillin-tazobactam (ZOSYN) IVPB 3.375 g (3.375 g Intravenous New Bag/Given 09/07/18 1829)  morphine 4 MG/ML injection 4 mg (4 mg Intravenous Given 09/07/18 1632)  methylPREDNISolone sodium succinate (SOLU-MEDROL) 125 mg/2 mL injection 125 mg (125 mg Intravenous Given 09/07/18 1632)  vancomycin (VANCOCIN) IVPB 1000 mg/200 mL premix (0 mg Intravenous Stopped 09/07/18 1941)  morphine 4 MG/ML injection 4 mg (4 mg Intravenous Given 09/07/18 1853)    Mobility walks Low fall risk   Focused Assessments    R Recommendations: See Admitting Provider Note  Report given to:   Additional Notes:

## 2018-09-07 NOTE — ED Triage Notes (Signed)
Pt states that she has had pneumonia twice this year she states that it is hard to breath she is coughing she gets sob just talking. She states that it hurts to breath.

## 2018-09-07 NOTE — Progress Notes (Signed)
Patient states she feels short of breath and unable to ambulate at this time. O2 sat 98% on 4L nasal cannula.  Will continue to monitor.

## 2018-09-07 NOTE — Progress Notes (Signed)
Pharmacy Antibiotic Note  Christine Ortega is a 58 y.o. female admitted on 09/07/2018 with pneumonia.  Pharmacy has been consulted for Vancomycin dosing.  Plan: Vancomycin 1000 mg IV x 1 dose. Vancomycin 500 mg IV every 12 hours.  Goal trough 15-20 mcg/mL.  Monitor labs, c/s, and vanco levels as indicated.  Height: 5\' 3"  (160 cm) Weight: 120 lb (54.4 kg) IBW/kg (Calculated) : 52.4  Temp (24hrs), Avg:97.8 F (36.6 C), Min:97.7 F (36.5 C), Max:97.9 F (36.6 C)  Recent Labs  Lab 09/07/18 1556  WBC 13.1*  CREATININE 0.52    Estimated Creatinine Clearance: 64.2 mL/min (by C-G formula based on SCr of 0.52 mg/dL).    Allergies  Allergen Reactions  . Nsaids Other (See Comments)    Causes GI bleeding  . Tramadol Hcl     Per Daughter- patient has " sleep paralysis"     Antimicrobials this admission: Vanco 6/5 >>  Cefepime 6/5 >>   Dose adjustments this admission: N/A  Microbiology results: 6/5 BCx: pending 6/5 Sputum: pending  6/5 MRSA PCR: pending  Thank you for allowing pharmacy to be a part of this patient's care.  Ramond Craver 09/07/2018 10:17 PM

## 2018-09-07 NOTE — Progress Notes (Signed)
Patient has sputum cup place in room. Explained to notify nurse when sputum collected.

## 2018-09-07 NOTE — H&P (Signed)
History and Physical  Christine Ortega JJH:[id number] DOB: 11/11/1960 DOA: 09/07/2018  Referring physician: Dr Stark Jock, ED physician PCP: Lucia Gaskins, MD  Outpatient Specialists: Dr Luan Pulling  Patient Coming From: home  Chief Complaint: SOB, cough  HPI: Christine Ortega is a 58 y.o. female with a history of seizure disorder, schizophrenia, depression, chronic pain, pulmonary fibrosis, history of DVT on eliquis.  The patient has been admitted twice since January for community-acquired pneumonia respiratory failure.  She feels that she breathes much better when on steroids and her breathing gets worse off steroids.  This last episode started approximately 2 weeks ago with a gradual decline.  She is now dyspneic to just a few steps.  She does have oxygen at home, which she needed to turn up as well.  She has been using her inhalers that she has been prescribed.  White sputum production with no other palliating or provoking factors.  Emergency Department Course: Patient started on IV steroids, healthcare associated pneumonia antibiotics.  Blood cultures obtained.  Review of Systems:   Pt denies any fevers, chills, nausea, vomiting, diarrhea, constipation, abdominal pain, shortness of breath, palpitations, headache, vision changes, lightheadedness, dizziness, melena, rectal bleeding.  Review of systems are otherwise negative  Past Medical History:  Diagnosis Date  . ADD (attention deficit disorder)   . Anxiety   . Bipolar disorder ([place])   . Chronic pain   . Depression   . Narcotic abuse in remission ([place])   . Schizophrenia ([place])   . Seizures ([place])    Past Surgical History:  Procedure Laterality Date  . BACK SURGERY    . ESOPHAGOGASTRODUODENOSCOPY N/A 05/19/2018   Procedure: ESOPHAGOGASTRODUODENOSCOPY (EGD);  Surgeon: Yetta Flock, MD;  Location: [facility] ENDOSCOPY;  Service: Gastroenterology;  Laterality: N/A;  egd at bedside in ICU, intubated  . IR ANGIOGRAM SELECTIVE EACH ADDITIONAL  VESSEL  05/20/2018  . IR ANGIOGRAM SELECTIVE EACH ADDITIONAL VESSEL  05/20/2018  . IR ANGIOGRAM VISCERAL SELECTIVE  05/20/2018  . IR ANGIOGRAM VISCERAL SELECTIVE  05/20/2018  . IR EMBO ART  VEN HEMORR LYMPH EXTRAV  INC GUIDE ROADMAPPING  05/20/2018  . IR US GUIDE VASC ACCESS LEFT  05/20/2018   Social History:  reports that she has quit smoking. She smoked 0.50 packs per day. She has never used smokeless tobacco. She reports that she does not drink alcohol or use drugs. Patient lives at home  Allergies  Allergen Reactions  . Nsaids Other (See Comments)    Causes GI bleeding  . Tramadol Hcl     Per Daughter- patient has " sleep paralysis"     Family history of lung disease  Prior to Admission medications   Medication Sig Start Date End Date Taking? Authorizing Provider  acetaminophen (TYLENOL) 500 MG tablet Take 1 tablet (500 mg total) by mouth every 8 (eight) hours as needed for moderate pain. 06/08/18 06/08/19 Yes Thurnell Lose, MD  apixaban (ELIQUIS) 5 MG TABS tablet Take 1 tablet (5 mg total) by mouth 2 (two) times daily. 2 pills mouth twice a day till 06/12/2018 then switch to 1 pill twice a day from 06/13/2018 Patient taking differently: Take 10 mg by mouth 2 (two) times daily.  06/08/18  Yes Thurnell Lose, MD  cetirizine (ZYRTEC) 10 MG tablet Take 10 mg by mouth daily.   Yes [provider]  FLUoxetine (PROZAC) 40 MG capsule Take 1 capsule (40 mg total) by mouth daily. For depression and anxiety. 08/08/11  Yes Ruben Im, PA-C  Fluticasone-Umeclidin-Vilant (TRELEGY ELLIPTA) 100-62.5-25 MCG/INH AEPB Inhale 1 puff into the lungs daily.   Yes [provider]  guaiFENesin (MUCINEX) 600 MG 12 hr tablet Take 800 mg by mouth 2 (two) times daily.    Yes [provider]  Melatonin 5 MG CAPS Take 20 mg by mouth at bedtime.    Yes [provider]  pantoprazole (PROTONIX) 40 MG tablet Take 1 tablet (40 mg total) by mouth 2 (two) times daily. 06/08/18  Yes Thurnell Lose, MD  Probiotic Product (PROBIOTIC PO) Take 1 tablet by mouth daily. Women's probiotic for GI health   Yes [provider]  lactose free nutrition (BOOST PLUS) LIQD Take 237 mLs by mouth 3 (three) times daily with meals. Patient not taking: Reported on 07/27/2018 06/08/18   Thurnell Lose, MD  metoprolol tartrate (LOPRESSOR) 25 MG tablet Take 1 tablet (25 mg total) by mouth 2 (two) times daily. Patient not taking: Reported on 07/27/2018 06/08/18   Thurnell Lose, MD  ziprasidone (GEODON) 80 MG capsule Take one capsule at bedtime for depression and mood stability. Patient not taking: Reported on 07/27/2018 08/08/11   Pamelia Hoit    Physical Exam: BP 128/90   Pulse 100   Temp 97.7 F (36.5 C) (Tympanic)   Resp (!) 27   Ht 5\' 3"  (1.6 m)   Wt 54.4 kg   SpO2 100%   BMI 21.26 kg/m   . General: 68 Caucasian female who appears older than stated age.. Awake and alert and oriented x3. No acute cardiopulmonary distress.  Marland Kitchen HEENT: Normocephalic atraumatic.  Right and left ears normal in appearance.  Pupils equal, round, reactive to light. Extraocular muscles are intact. Sclerae anicteric and noninjected.  Moist mucosal membranes. No mucosal lesions.  . Neck: Neck supple without lymphadenopathy. No carotid bruits. No masses palpated.  . Cardiovascular: Tachycardic rate with normal S1-S2 sounds. No murmurs, rubs, gallops auscultated. No JVD.  Marland Kitchen Respiratory: Expiratory wheezes throughout.  Rales in the right base.  No accessory muscle use. . Abdomen: Soft, nontender, nondistended. Active bowel sounds. No masses or hepatosplenomegaly  . Skin: No rashes, lesions, or ulcerations.  Dry, warm to touch. 2+ dorsalis pedis and radial pulses. . Musculoskeletal: No calf or leg pain. All major joints not erythematous nontender.  No upper or lower joint deformation.  Good ROM.  No contractures  . Psychiatric: Intact judgment and insight. Pleasant and cooperative. . Neurologic:  No focal neurological deficits. Strength is 5/5 and symmetric in upper and lower extremities.  Cranial nerves II through XII are grossly intact.           Labs on Admission: I have personally reviewed following labs and imaging studies  CBC: Recent Labs  Lab 09/07/18 1556  WBC 13.1*  NEUTROABS 6.6  HGB 11.6*  HCT 36.5  MCV 98.1  PLT 093*   Basic Metabolic Panel: Recent Labs  Lab 09/07/18 1556  NA 142  K 3.0*  CL 106  CO2 26  GLUCOSE 93  BUN 7  CREATININE 0.52  CALCIUM 8.8*   GFR: Estimated Creatinine Clearance: 64.2 mL/min (by C-G formula based on SCr of 0.52 mg/dL). Liver Function Tests: Recent Labs  Lab 09/07/18 1556  AST 21  ALT 10  ALKPHOS 123  BILITOT 0.4  PROT 6.6  ALBUMIN 2.8*   No results for input(s): LIPASE, AMYLASE in the last 168 hours. No results for input(s): AMMONIA in the last 168 hours. Coagulation Profile: No results for input(s):  INR, PROTIME in the last 168 hours. Cardiac Enzymes: Recent Labs  Lab 09/07/18 1556  TROPONINI <0.03   BNP (last 3 results) No results for input(s): PROBNP in the last 8760 hours. HbA1C: No results for input(s): HGBA1C in the last 72 hours. CBG: No results for input(s): GLUCAP in the last 168 hours. Lipid Profile: No results for input(s): CHOL, HDL, LDLCALC, TRIG, CHOLHDL, LDLDIRECT in the last 72 hours. Thyroid Function Tests: No results for input(s): TSH, T4TOTAL, FREET4, T3FREE, THYROIDAB in the last 72 hours. Anemia Panel: No results for input(s): VITAMINB12, FOLATE, FERRITIN, TIBC, IRON, RETICCTPCT in the last 72 hours. Urine analysis:    Component Value Date/Time   COLORURINE YELLOW 05/19/2018 0921   APPEARANCEUR CLOUDY (A) 05/19/2018 0921   LABSPEC 1.008 05/19/2018 0921   PHURINE 6.0 05/19/2018 0921   GLUCOSEU NEGATIVE 05/19/2018 0921   HGBUR SMALL (A) 05/19/2018 0921   BILIRUBINUR NEGATIVE 05/19/2018 0921   KETONESUR NEGATIVE 05/19/2018 0921   PROTEINUR 30 (A) 05/19/2018 0921   NITRITE  NEGATIVE 05/19/2018 0921   LEUKOCYTESUR LARGE (A) 05/19/2018 0921   Sepsis Labs: @LABRCNTIP (procalcitonin:4,lacticidven:4) ) Recent Results (from the past 240 hour(s))  SARS Coronavirus 2 (CEPHEID- Performed in [facility] hospital lab), Hosp Order     Status: None   Collection Time: 09/07/18  3:56 PM  Result Value Ref Range Status   SARS Coronavirus 2 NEGATIVE NEGATIVE Final    Comment: (NOTE) If result is NEGATIVE SARS-CoV-2 target nucleic acids are NOT DETECTED. The SARS-CoV-2 RNA is generally detectable in upper and lower  respiratory specimens during the acute phase of infection. The lowest  concentration of SARS-CoV-2 viral copies this assay can detect is 250  copies / mL. A negative result does not preclude SARS-CoV-2 infection  and should not be used as the sole basis for treatment or other  patient management decisions.  A negative result may occur with  improper specimen collection / handling, submission of specimen other  than nasopharyngeal swab, presence of viral mutation(s) within the  areas targeted by this assay, and inadequate number of viral copies  (<250 copies / mL). A negative result must be combined with clinical  observations, patient history, and epidemiological information. If result is POSITIVE SARS-CoV-2 target nucleic acids are DETECTED. The SARS-CoV-2 RNA is generally detectable in upper and lower  respiratory specimens dur ing the acute phase of infection.  Positive  results are indicative of active infection with SARS-CoV-2.  Clinical  correlation with patient history and other diagnostic information is  necessary to determine patient infection status.  Positive results do  not rule out bacterial infection or co-infection with other viruses. If result is PRESUMPTIVE POSTIVE SARS-CoV-2 nucleic acids MAY BE PRESENT.   A presumptive positive result was obtained on the submitted specimen  and confirmed on repeat testing.  While 2019 novel coronavirus   (SARS-CoV-2) nucleic acids may be present in the submitted sample  additional confirmatory testing may be necessary for epidemiological  and / or clinical management purposes  to differentiate between  SARS-CoV-2 and other Sarbecovirus currently known to infect humans.  If clinically indicated additional testing with an alternate test  methodology [phone]) is advised. The SARS-CoV-2 RNA is generally  detectable in upper and lower respiratory sp ecimens during the acute  phase of infection. The expected result is Negative. Fact Sheet for Patients:  StrictlyIdeas.no Fact Sheet for Healthcare Providers: BankingDealers.co.za This test is not yet approved or cleared by the [place] FDA and has been authorized for  detection and/or diagnosis of SARS-CoV-2 by FDA under an Emergency Use Authorization (EUA).  This EUA will remain in effect (meaning this test can be used) for the duration of the COVID-19 declaration under Section 564(b)(1) of the Act, 21 U.S.C. section 360bbb-3(b)(1), unless the authorization is terminated or revoked sooner. Performed at [facility], [address] [place], [place] [address]      Radiological Exams on Admission: Dg Chest Port 1 View  Result Date: 09/07/2018 CLINICAL DATA:  Cough with shortness of breath. EXAM: PORTABLE CHEST 1 VIEW COMPARISON:  08/01/2018. FINDINGS: The heart is enlarged. On a background of chronic interstitial lung disease, there is asymmetric increased opacity in the RIGHT mid and lower lung zones which could represent infection or edema. No effusion or pneumothorax. Bones unremarkable. IMPRESSION: Cardiomegaly. Asymmetric increased opacity in the RIGHT mid and lower lung zones which could represent infection or edema. Worsening aeration. Chronic interstitial lung disease. Correlate clinically for pulmonary edema versus early RIGHT lung infiltrate. Electronically Signed   By: Staci Righter M.D.    On: 09/07/2018 17:14    EKG: Independently reviewed.  Sinus tachycardia. no acute ST changes.  Assessment/Plan: Principal Problem:   Acute on chronic respiratory failure with hypoxia (HCC) Active Problems:   Narcotic abuse in remission ([place])   Bipolar disorder ([place])   HCAP (healthcare-associated pneumonia)   History of DVT (deep vein thrombosis)    This patient was discussed with the ED physician, including pertinent vitals, physical exam findings, labs, and imaging.  We also discussed care given by the ED provider.  1. Acute on chronic respiratory failure with hypoxia a. Admit  b. Oxygen support c. Continue steroids d. Continuous pulse oximetry 2. Healthcare associated pneumonia Antibiotics: Vanc and cefepime Robitussin Blood cultures drawn in the emergency department Sputum cultures CBC tomorrow Strep antigen by urine Respiratory virus screen MRSA screen 3. History of DVT a. Continue eliquis 4. Pulmonary fibrosis a. Continue long acting inhalers 5. Bipolar 6. History of narcotic abuse  DVT prophylaxis: eliquis Consultants: pulmonology Code Status: full Family Communication:   Disposition Plan: Home following stabilization of oxygen levels   Truett Mainland, DO

## 2018-09-07 NOTE — ED Notes (Signed)
Supervisor to get pumps and modulars.

## 2018-09-08 ENCOUNTER — Inpatient Hospital Stay (HOSPITAL_COMMUNITY): Payer: Medicare Other

## 2018-09-08 DIAGNOSIS — K219 Gastro-esophageal reflux disease without esophagitis: Secondary | ICD-10-CM

## 2018-09-08 DIAGNOSIS — D649 Anemia, unspecified: Secondary | ICD-10-CM

## 2018-09-08 DIAGNOSIS — J8410 Pulmonary fibrosis, unspecified: Secondary | ICD-10-CM

## 2018-09-08 DIAGNOSIS — R739 Hyperglycemia, unspecified: Secondary | ICD-10-CM

## 2018-09-08 DIAGNOSIS — F319 Bipolar disorder, unspecified: Secondary | ICD-10-CM

## 2018-09-08 DIAGNOSIS — F1111 Opioid abuse, in remission: Secondary | ICD-10-CM

## 2018-09-08 DIAGNOSIS — J189 Pneumonia, unspecified organism: Principal | ICD-10-CM

## 2018-09-08 DIAGNOSIS — G894 Chronic pain syndrome: Secondary | ICD-10-CM

## 2018-09-08 DIAGNOSIS — D473 Essential (hemorrhagic) thrombocythemia: Secondary | ICD-10-CM

## 2018-09-08 DIAGNOSIS — Z86718 Personal history of other venous thrombosis and embolism: Secondary | ICD-10-CM

## 2018-09-08 DIAGNOSIS — F200 Paranoid schizophrenia: Secondary | ICD-10-CM

## 2018-09-08 DIAGNOSIS — F988 Other specified behavioral and emotional disorders with onset usually occurring in childhood and adolescence: Secondary | ICD-10-CM

## 2018-09-08 DIAGNOSIS — E876 Hypokalemia: Secondary | ICD-10-CM

## 2018-09-08 DIAGNOSIS — J441 Chronic obstructive pulmonary disease with (acute) exacerbation: Secondary | ICD-10-CM

## 2018-09-08 LAB — CBC
HCT: 35.3 % — ABNORMAL LOW (ref 36.0–46.0)
Hemoglobin: 11.3 g/dL — ABNORMAL LOW (ref 12.0–15.0)
MCH: 31 pg (ref 26.0–34.0)
MCHC: 32 g/dL (ref 30.0–36.0)
MCV: 96.7 fL (ref 80.0–100.0)
Platelets: 505 10*3/uL — ABNORMAL HIGH (ref 150–400)
RBC: 3.65 MIL/uL — ABNORMAL LOW (ref 3.87–5.11)
RDW: 14.2 % (ref 11.5–15.5)
WBC: 9.1 10*3/uL (ref 4.0–10.5)
nRBC: 0 % (ref 0.0–0.2)

## 2018-09-08 LAB — RESPIRATORY PANEL BY PCR

## 2018-09-08 LAB — RAPID URINE DRUG SCREEN, HOSP PERFORMED
Amphetamines: NOT DETECTED
Barbiturates: NOT DETECTED
Benzodiazepines: NOT DETECTED
Cocaine: NOT DETECTED
Opiates: POSITIVE — AB
Tetrahydrocannabinol: NOT DETECTED

## 2018-09-08 LAB — BASIC METABOLIC PANEL
Anion gap: 13 (ref 5–15)
BUN: 6 mg/dL (ref 6–20)
CO2: 24 mmol/L (ref 22–32)
Calcium: 8.8 mg/dL — ABNORMAL LOW (ref 8.9–10.3)
Chloride: 100 mmol/L (ref 98–111)
Creatinine, Ser: 0.53 mg/dL (ref 0.44–1.00)
GFR calc Af Amer: >60 mL/min (ref >60)
GFR calc non Af Amer: >60 mL/min (ref >60)
Glucose, Bld: 159 mg/dL — ABNORMAL HIGH (ref 70–99)
Potassium: 3.5 mmol/L (ref 3.5–5.1)
Sodium: 137 mmol/L (ref 135–145)

## 2018-09-08 LAB — STREP PNEUMONIAE URINARY ANTIGEN: Strep Pneumo Urinary Antigen: NEGATIVE

## 2018-09-08 LAB — PROCALCITONIN: Procalcitonin: 5.15 ng/mL

## 2018-09-08 LAB — MRSA PCR SCREENING: MRSA by PCR: NEGATIVE

## 2018-09-08 MED ORDER — HYDROCODONE-HOMATROPINE 5-1.5 MG/5ML PO SYRP
5.0000 mL | ORAL_SOLUTION | Freq: Four times a day (QID) | ORAL | Status: DC | PRN
  Administered 2018-09-08 – 2018-09-13 (×20): 5 mL via ORAL
  Filled 2018-09-08 (×21): qty 5

## 2018-09-08 MED ORDER — METHYLPREDNISOLONE SODIUM SUCC 125 MG IJ SOLR
60.0000 mg | Freq: Four times a day (QID) | INTRAMUSCULAR | Status: DC
  Administered 2018-09-08 – 2018-09-09 (×5): 60 mg via INTRAVENOUS
  Filled 2018-09-08 (×5): qty 2

## 2018-09-08 MED ORDER — HYDROXYZINE HCL 25 MG PO TABS: 25.0000 mg | ORAL_TABLET | Freq: Three times a day (TID) | ORAL | Status: DC | PRN

## 2018-09-08 MED ORDER — SODIUM CHLORIDE 0.9 % IV SOLN
2.0000 g | Freq: Three times a day (TID) | INTRAVENOUS | Status: DC
  Administered 2018-09-08 – 2018-09-12 (×11): 2 g via INTRAVENOUS
  Filled 2018-09-08 (×12): qty 2

## 2018-09-08 MED ORDER — GUAIFENESIN ER 600 MG PO TB12
1200.0000 mg | ORAL_TABLET | Freq: Two times a day (BID) | ORAL | Status: DC
  Administered 2018-09-08 – 2018-09-13 (×10): 1200 mg via ORAL
  Filled 2018-09-08 (×10): qty 2

## 2018-09-08 MED ORDER — FUROSEMIDE 10 MG/ML IJ SOLN
40.0000 mg | Freq: Once | INTRAMUSCULAR | Status: AC
  Administered 2018-09-08: 16:00:00 40 mg via INTRAVENOUS
  Filled 2018-09-08: qty 4

## 2018-09-08 MED ORDER — OXYCODONE HCL 5 MG PO TABS
5.0000 mg | ORAL_TABLET | Freq: Four times a day (QID) | ORAL | Status: DC | PRN
  Administered 2018-09-08 – 2018-09-09 (×4): 5 mg via ORAL
  Filled 2018-09-08 (×4): qty 1

## 2018-09-08 MED ORDER — ZIPRASIDONE HCL 40 MG PO CAPS
ORAL_CAPSULE | ORAL | Status: AC
  Filled 2018-09-08: qty 2

## 2018-09-08 MED ORDER — OXYCODONE HCL 5 MG PO TABS
5.0000 mg | ORAL_TABLET | Freq: Once | ORAL | Status: AC
  Administered 2018-09-08: 5 mg via ORAL
  Filled 2018-09-08: qty 1

## 2018-09-08 MED ORDER — ALPRAZOLAM 1 MG PO TABS
1.0000 mg | ORAL_TABLET | Freq: Once | ORAL | Status: AC
  Administered 2018-09-08: 1 mg via ORAL
  Filled 2018-09-08 (×2): qty 1

## 2018-09-08 MED ORDER — ENSURE ENLIVE PO LIQD
237.0000 mL | Freq: Two times a day (BID) | ORAL | Status: DC
  Administered 2018-09-10 – 2018-09-13 (×7): 237 mL via ORAL

## 2018-09-08 MED ORDER — VANCOMYCIN HCL 500 MG IV SOLR
INTRAVENOUS | Status: AC
  Filled 2018-09-08: qty 500

## 2018-09-08 MED ORDER — ZIPRASIDONE HCL 40 MG PO CAPS
80.0000 mg | ORAL_CAPSULE | Freq: Every day | ORAL | Status: DC
  Filled 2018-09-08 (×2): qty 1

## 2018-09-08 NOTE — Progress Notes (Signed)
PROGRESS NOTE    Christine Ortega  SWN:[id number] DOB: [birth year] DOA: 09/07/2018 PCP: Lucia Gaskins, MD   Brief Narrative:  HPI per Dr. Loma Ortega on 09/07/2018 HPI: Christine Ortega is a 58 y.o. female with a history of seizure disorder, schizophrenia, depression, chronic pain, pulmonary fibrosis, history of DVT on eliquis.  The patient has been admitted twice since January for community-acquired pneumonia respiratory failure.  She feels that she breathes much better when on steroids and her breathing gets worse off steroids.  This last episode started approximately 2 weeks ago with a gradual decline.  She is now dyspneic to just a few steps.  She does have oxygen at home, which she needed to turn up as well.  She has been using her inhalers that she has been prescribed.  White sputum production with no other palliating or provoking factors.  Emergency Department Course: Patient started on IV steroids, healthcare associated pneumonia antibiotics.  Blood cultures obtained.  **Interim History Continues to not feel well and becomes dyspneic and severely coughing.  Has several coughing fits and is difficult to gather her breath.  Pulmonary was consulted for further evaluation recommendations and they are agreeing with current management.  Assessment & Plan:   Principal Problem:   Acute on chronic respiratory failure with hypoxia (HCC) Active Problems:   Narcotic abuse in remission (HCC)   Bipolar disorder (HCC)   Chronic pain   ADD (attention deficit disorder)   Schizophrenia, paranoid type (HCC)   Thrombocytosis (HCC)   Hypokalemia   HCAP (healthcare-associated pneumonia)   History of DVT (deep vein thrombosis)   Normocytic anemia   GERD (gastroesophageal reflux disease)   Hyperglycemia   COPD with acute exacerbation (HCC)   Pulmonary fibrosis (HCC)  Acute on Chronic Respiratory Failure with hypoxia in the setting of HCAP, COPD, and Pulmonary Fibrosis -Admitted to Inpatient  Telemetry -CXR showed cardiomegaly and asymmetric increased opacity in the right mid and lower lung zones which could represent infection or edema as well as worsening aeration.  There is also chronic interstitial lung disease -Continue supplemental oxygen via nasal cannula and wean O2 as tolerated -Continuous pulse oximetry and maintain O2 saturations greater than 92% -Continue with IV Solu-Medrol 60 mg every 6.  She received a dose of IV Solu-Medrol 25 mg in the ED -Continue with Duoneb 3 mL's every 6 scheduled along albuterol 2.5 mg nebs every 2 PRN for shortness of breath; cContinue with Breo Ellipta 1 puff ventilation daily along with Incruse Ellipta 1 puff daily -We will add guaifenesin 1200 mg p.o. twice daily along with Hycodan 5 mL's p.o. every 6 as needed for cough; benzonatate 200 mg was given once in the ED yesterday -Calcitonin level went from 10.58 is now 5.15 and trending down -BNP on admission was 69.0 -WBC went from 13.1 is now 9.1 -Check respiratory virus panel and continue Droplet Precautions -SARS-CoV-2 virus was negative -We will need home ambulatory screen prior to discharge -Continue with PPI twice daily given steroids to prevent a gastritis and Gastric Ulcer -We will give one-time dose of IV Lasix 40 mg -Continue to monitor respiratory status carefully and repeat chest x-ray in the a.m.; chest x-ray this a.m. is still pending -Pulmonary Consulted and Following; Appreciate further evaluation and recommendations   Healthcare Associated Pneumonia -Treatment as above -Started on antibiotic coverage with IV vancomycin IV cefepime for now and will de-escalate and stop IV vancomycin given MRSA negative PCR -Blood cultures x2 showed no growth to date less  than 12 hours -Sputum culture never obtained will obtain now -Strep Urinary and Legionella Urine Ag Pending  -Check for Respiratory Virus Panel and place on empiric Droplet precautions -WBC is trending down as well as  procalcitonin-continue monitor repeat CBC in the a.m.  History of DVT -C/w Apixaban 5 mg po BID  Pulmonary Fibrosis -Continue with Duoneb 3 mL's every 6 scheduled along albuterol 2.5 mg nebs every 2 PRN for shortness of breath; cContinue with Breo Ellipta 1 puff ventilation daily along with Incruse Ellipta 1 puff daily -We will add guaifenesin 1200 mg p.o. twice daily along with Hycodan 5 mL's p.o. every 6 as needed for cough; benzonatate 200 mg was given once in the ED yesterday -Pulmonary   Acute on Chronic COPD Exacerbation -Treatment as Above -C/w IV Steroids, Nebs, and Abx; C/w Supplemental O2 via   -Will need Ambulatory Home O2 Screen prior to D/C -C/w Breo-Ellipta and Incruse Ellipta  Bipolar Disorder/Anxeity/Depression/ADD/Schizophrenia -C/w Fluoxetine 40 mg po Daily  -Per MAR no longer taking Home Ziprazidone 80 mg po qHS  History of Narcotic Abuse and Chronic Pain -Given IV Morphine 4 mg x2 in the ED -Judicious use of narcotics given history of narcotic abuse and currently she is placed on Oxycodone 5 mg p.o. every 6 PRN for severe pain;  -AVOID IV NARCOTICS if POSSIBLE -Continue with Hydrocodone-Homatroprine for Cough  GERD -C/w PPI BID   Normocytic Anemia -Patient's hemoglobin/hematocrit went from 11.6/36.5 is now 11.3/35.3 -Check anemia panel in the a.m. -Continue monitor for signs and symptoms of bleeding; currently no evidence of any overt bleeding -Continue monitor and trend -Repeat CBC in the a.m.  Thrombocytosis -Likely reactive and is trending down -Platelet count is trending downward from 5 35-5 05 -Continue monitor and trend -Repeat CBC in a.m.  Hypokalemia -Patient's potassium admission was 3.0 -Improved after repletion is now 3.5 -We will give additional treatment of 40 mEq -Continue monitor and trend -Check magnesium in the AM -Continue monitor and trend  Hyperglycemia -Patient's blood sugar on CMP on admission was 93 is now 159 on BMP this  morning-likely in the setting of IV steroid demargination-continue monitor and trend carefully -Blood sugars remain consistently elevated will place on considerable sliding scale insulin AC -Check hemoglobin A1c to rule out diabetes  DVT prophylaxis: Anticoagulated with Apixaban Code Status: FULL CODE  Family Communication: No family present at bedside  Disposition Plan: Remain Inpatient for continued workup and Treatment  Consultants:   Pulmonary Dr. Luan Pulling   Procedures:  None   Antimicrobials:  Anti-infectives (From admission, onward)   Start     Dose/Rate Route Frequency Ordered Stop   09/08/18 1400  ceFEPIme (MAXIPIME) 2 g in sodium chloride 0.9 % 100 mL IVPB     2 g 200 mL/hr over 30 Minutes Intravenous Every 8 hours 09/08/18 0849 09/15/18 2159   09/08/18 0600  vancomycin (VANCOCIN) 500 mg in sodium chloride 0.9 % 100 mL IVPB  Status:  Discontinued     500 mg 100 mL/hr over 60 Minutes Intravenous Every 12 hours 09/07/18 2216 09/08/18 1302   09/07/18 2200  ceFEPIme (MAXIPIME) 1 g in sodium chloride 0.9 % 100 mL IVPB  Status:  Discontinued     1 g 200 mL/hr over 30 Minutes Intravenous Every 8 hours 09/07/18 2028 09/08/18 0849   09/07/18 1745  vancomycin (VANCOCIN) IVPB 1000 mg/200 mL premix     1,000 mg 200 mL/hr over 60 Minutes Intravenous  Once 09/07/18 1732 09/07/18 1941   09/07/18 1745  piperacillin-tazobactam (ZOSYN) IVPB 3.375 g     3.375 g 12.5 mL/hr over 240 Minutes Intravenous  Once 09/07/18 1732 09/07/18 2229     Subjective: Seen and examined at bedside and states that she was having pain from all over.  States specifically in her ribs and her back from coughing.  Has had 2 coughing fits while I am in the room which are difficult to abate.  No nausea or vomiting but patient does not feel well.  No lightheadedness or dizziness.  No other concerns or complaints at this time and still feels short of breath and dyspneic.  Objective: Vitals:   09/08/18 0501  09/08/18 0827 09/08/18 1158 09/08/18 1307  BP: (!) 152/94   (!) 144/91  Pulse: 98   (!) 112  Resp: 18   20  Temp: 98.1 F (36.7 C)   98.1 F (36.7 C)  TempSrc: Oral   Oral  SpO2: 99% 94% 98% 97%  Weight:      Height:        Intake/Output Summary (Last 24 hours) at 09/08/2018 1317 Last data filed at 09/08/2018 0900 Gross per 24 hour  Intake 1060 ml  Output 1500 ml  Net -440 ml   Filed Weights   09/07/18 1432  Weight: 54.4 kg   Examination: Physical Exam:  Constitutional: Thin Caucasian female who appears older than her stated age in Mild Respiratory Distress appears anxious and uncomfortable  Eyes: Lids and conjunctivae normal, sclerae anicteric  ENMT: External Ears, Nose appear normal. Grossly normal hearing. Mucous membranes are moist.  Neck: Appears normal, supple, no cervical masses, normal ROM, no appreciable thyromegaly; no JVD Respiratory: Significantly diminished to auscultation bilaterally with coarse breath sounds and rhonchi and scattered wheezing. Coughing during examination. Increased Respiratory Rate and Wearing Supplemental O2 via [place]. No accessory muscle use.  Cardiovascular: Tachycardic Rate but regular rhythm, no murmurs / rubs / gallops. S1 and S2 auscultated. 1+ LE edema  Abdomen: Soft, mildly tender; non-distended. No masses palpated. No appreciable hepatosplenomegaly. Bowel sounds positive x4.  GU: Deferred. Musculoskeletal: No clubbing / cyanosis of digits/nails. No joint deformity upper and lower extremities.  Skin: No rashes, lesions, ulcers on a limited skin evaluation. No induration; Warm and dry.  Neurologic: CN 2-12 grossly intact with no focal deficits.  Psychiatric: Normal judgment and insight. Alert and oriented x 3. Anxious mood and appropriate affect.   Data Reviewed: I have personally reviewed following labs and imaging studies  CBC: Recent Labs  Lab 09/07/18 1556 09/08/18 0555  WBC 13.1* 9.1  NEUTROABS 6.6  --   HGB 11.6* 11.3*  HCT 36.5  35.3*  MCV 98.1 96.7  PLT 535* 263*   Basic Metabolic Panel: Recent Labs  Lab 09/07/18 1556 09/08/18 0555  NA 142 137  K 3.0* 3.5  CL 106 100  CO2 26 24  GLUCOSE 93 159*  BUN 7 6  CREATININE 0.52 0.53  CALCIUM 8.8* 8.8*   GFR: Estimated Creatinine Clearance: 64.2 mL/min (by C-G formula based on SCr of 0.53 mg/dL). Liver Function Tests: Recent Labs  Lab 09/07/18 1556  AST 21  ALT 10  ALKPHOS 123  BILITOT 0.4  PROT 6.6  ALBUMIN 2.8*   No results for input(s): LIPASE, AMYLASE in the last 168 hours. No results for input(s): AMMONIA in the last 168 hours. Coagulation Profile: No results for input(s): INR, PROTIME in the last 168 hours. Cardiac Enzymes: Recent Labs  Lab 09/07/18 1556  TROPONINI <0.03   BNP (last 3 results)  No results for input(s): PROBNP in the last 8760 hours. HbA1C: No results for input(s): HGBA1C in the last 72 hours. CBG: No results for input(s): GLUCAP in the last 168 hours. Lipid Profile: No results for input(s): CHOL, HDL, LDLCALC, TRIG, CHOLHDL, LDLDIRECT in the last 72 hours. Thyroid Function Tests: No results for input(s): TSH, T4TOTAL, FREET4, T3FREE, THYROIDAB in the last 72 hours. Anemia Panel: No results for input(s): VITAMINB12, FOLATE, FERRITIN, TIBC, IRON, RETICCTPCT in the last 72 hours. Sepsis Labs: Recent Labs  Lab 09/07/18 1556 09/08/18 0555  PROCALCITON 10.58 5.15    Recent Results (from the past 240 hour(s))  SARS Coronavirus 2 (CEPHEID- Performed in [facility] hospital lab), Hosp Order     Status: None   Collection Time: 09/07/18  3:56 PM  Result Value Ref Range Status   SARS Coronavirus 2 NEGATIVE NEGATIVE Final    Comment: (NOTE) If result is NEGATIVE SARS-CoV-2 target nucleic acids are NOT DETECTED. The SARS-CoV-2 RNA is generally detectable in upper and lower  respiratory specimens during the acute phase of infection. The lowest  concentration of SARS-CoV-2 viral copies this assay can detect is 250  copies  / mL. A negative result does not preclude SARS-CoV-2 infection  and should not be used as the sole basis for treatment or other  patient management decisions.  A negative result may occur with  improper specimen collection / handling, submission of specimen other  than nasopharyngeal swab, presence of viral mutation(s) within the  areas targeted by this assay, and inadequate number of viral copies  (<250 copies / mL). A negative result must be combined with clinical  observations, patient history, and epidemiological information. If result is POSITIVE SARS-CoV-2 target nucleic acids are DETECTED. The SARS-CoV-2 RNA is generally detectable in upper and lower  respiratory specimens dur ing the acute phase of infection.  Positive  results are indicative of active infection with SARS-CoV-2.  Clinical  correlation with patient history and other diagnostic information is  necessary to determine patient infection status.  Positive results do  not rule out bacterial infection or co-infection with other viruses. If result is PRESUMPTIVE POSTIVE SARS-CoV-2 nucleic acids MAY BE PRESENT.   A presumptive positive result was obtained on the submitted specimen  and confirmed on repeat testing.  While 2019 novel coronavirus  (SARS-CoV-2) nucleic acids may be present in the submitted sample  additional confirmatory testing may be necessary for epidemiological  and / or clinical management purposes  to differentiate between  SARS-CoV-2 and other Sarbecovirus currently known to infect humans.  If clinically indicated additional testing with an alternate test  methodology [phone]) is advised. The SARS-CoV-2 RNA is generally  detectable in upper and lower respiratory sp ecimens during the acute  phase of infection. The expected result is Negative. Fact Sheet for Patients:  StrictlyIdeas.no Fact Sheet for Healthcare Providers: BankingDealers.co.za This test  is not yet approved or cleared by the [place] FDA and has been authorized for detection and/or diagnosis of SARS-CoV-2 by FDA under an Emergency Use Authorization (EUA).  This EUA will remain in effect (meaning this test can be used) for the duration of the COVID-19 declaration under Section 564(b)(1) of the Act, 21 U.S.C. section 360bbb-3(b)(1), unless the authorization is terminated or revoked sooner. Performed at [facility], [address], [place], [place] [address]   MRSA PCR Screening     Status: None   Collection Time: 09/07/18  6:53 PM  Result Value Ref Range Status   MRSA by  PCR NEGATIVE NEGATIVE Final    Comment:        The GeneXpert MRSA Assay (FDA approved for NASAL specimens only), is one component of a comprehensive MRSA colonization surveillance program. It is not intended to diagnose MRSA infection nor to guide or monitor treatment for MRSA infections. Performed at [facility], [address], [place], [place] [address]   Culture, blood (routine x 2) Call MD if unable to obtain prior to antibiotics being given     Status: None (Preliminary result)   Collection Time: 09/07/18  9:22 PM  Result Value Ref Range Status   Specimen Description BLOOD LEFT HAND  Final   Special Requests   Final    BOTTLES DRAWN AEROBIC AND ANAEROBIC Blood Culture adequate volume   Culture   Final    NO GROWTH < 12 HOURS Performed at [facility], [address], [place], [place] [address]    Report Status PENDING  Incomplete  Culture, blood (routine x 2) Call MD if unable to obtain prior to antibiotics being given     Status: None (Preliminary result)   Collection Time: 09/07/18  9:34 PM  Result Value Ref Range Status   Specimen Description BLOOD RIGHT ARM  Final   Special Requests   Final    BOTTLES DRAWN AEROBIC AND ANAEROBIC Blood Culture adequate volume   Culture   Final    NO GROWTH < 12 HOURS Performed at [facility], [address] [place], [place] [address]     Report Status PENDING  Incomplete    Radiology Studies: Dg Chest Port 1 View  Result Date: 09/07/2018 CLINICAL DATA:  Cough with shortness of breath. EXAM: PORTABLE CHEST 1 VIEW COMPARISON:  08/01/2018. FINDINGS: The heart is enlarged. On a background of chronic interstitial lung disease, there is asymmetric increased opacity in the RIGHT mid and lower lung zones which could represent infection or edema. No effusion or pneumothorax. Bones unremarkable. IMPRESSION: Cardiomegaly. Asymmetric increased opacity in the RIGHT mid and lower lung zones which could represent infection or edema. Worsening aeration. Chronic interstitial lung disease. Correlate clinically for pulmonary edema versus early RIGHT lung infiltrate. Electronically Signed   By: Staci Righter M.D.   On: 09/07/2018 17:14   Scheduled Meds:  apixaban  5 mg Oral BID   feeding supplement (ENSURE ENLIVE)  237 mL Oral TID WC   FLUoxetine  40 mg Oral Daily   fluticasone furoate-vilanterol  1 puff Inhalation Daily   And   umeclidinium bromide  1 puff Inhalation Daily   furosemide  40 mg Intravenous Once   guaiFENesin  1,200 mg Oral BID   ipratropium-albuterol  3 mL Nebulization Q6H   methylPREDNISolone (SOLU-MEDROL) injection  60 mg Intravenous Q6H   pantoprazole  40 mg Oral BID   pneumococcal 23 valent vaccine  0.5 mL Intramuscular Tomorrow-1000   Continuous Infusions:  ceFEPime (MAXIPIME) IV      LOS: 1 day    Kerney Elbe, DO [facility] PAGER is on AMION  If 7PM-7AM, please contact night-coverage [email or web address] Password [email or web address] 09/08/2018, 1:17 PM

## 2018-09-08 NOTE — Progress Notes (Addendum)
Initial Nutrition Assessment  DOCUMENTATION CODES:  Not applicable  INTERVENTION:  Decrease ENsure to BID, each supplement provides 350 kcal and 20 grams of protein  Recommend diet liberalization - Pt has lost >10% of her BW this year due to recurrent acute illness. She would benefit from weight gain. She cites her diet restrictions as negatively impacting her oral intake.  Addendum- received v/o to liberalize diet.   NUTRITION DIAGNOSIS:  Inadequate oral intake related to chronic illness(Pulmonary fibrosis) as evidenced by per patient/family report of SOB interfering with ability to eat  GOAL:  Patient will meet greater than or equal to 90% of their needs  MONITOR:  PO intake, Labs, I & O's, Supplement acceptance, Weight trends, Diet advancement  REASON FOR ASSESSMENT:  Consult COPD Protocol  ASSESSMENT:  58 y/o female PMHx COPD, pulmonary fibrosis, bipolar disorder, chronic pain, schizophrenia and narcotic abuse. Has been hospitalized twice this year with PNA. Presents with recurrent SOB over past 2 weeks. Admitted due to increased oxygen requirement.   RD operating remotely on weekends. On initially calling, patient is extremely SOB/tachypnic. RD told pt he would call back later when breathing isnt so tenuous.   On calling back a couple hours later, her breathing has improved. We discussed her weight loss. Weight wise, pt appears to have had substantial weight loss this year. She was 140-150 this past February and is current only 120 lbs. Pt says this is because "I have been sick since January". This is currently her 3rd admission in the past 6 months and the first of which was quite serious and required icu care/intubation.   More recently, she says she has "a big appetite", but often isnt able to eat as much as she wants due to her SOB interfering. She has Ensure/Boost at home that she supplements with. She was agreeable to receiving oral supplements while admitted.    At this  time, pt is eating 50-75% of her meals. She voices her displeasure regarding her diet restrictions, saying "why arent ya'll giving me salt?Marland Kitchen..if y'all want me to eat better, give me some salt". Patient likely has some element of ageusia/dysgeusia due to her need for O2 in setting of COPD/pulmonary fibrosis- can see why this would be helpful for her intake. RD messaged MD regarding pts request and received V/O to liberalize.   Labs: Glu: 169, WBC:13.1, Albumin: 2.8 Meds: Lasix?, Methylprednisolone, lasix, ppi, IVF, iv abx, prn opiates   Recent Labs  Lab 09/07/18 1556 09/08/18 0555  NA 142 137  K 3.0* 3.5  CL 106 100  CO2 26 24  BUN 7 6  CREATININE 0.52 0.53  CALCIUM 8.8* 8.8*  GLUCOSE 93 159*   NUTRITION - FOCUSED PHYSICAL EXAM: Unable to conduct  Diet Order:   Diet Order            Diet Heart Room service appropriate? Yes; Fluid consistency: Thin  Diet effective now             EDUCATION NEEDS:  Not appropriate for education at this time  Skin:  Skin Assessment: Reviewed RN Assessment  Last BM:  6/5  Height:  Ht Readings from Last 1 Encounters:  09/07/18 5\' 3"  (1.6 m)   Weight:  Wt Readings from Last 1 Encounters:  09/07/18 54.4 kg   Wt Readings from Last 10 Encounters:  09/07/18 54.4 kg  07/27/18 54.5 kg  06/08/18 56.1 kg  08/25/11 76.2 kg   Ideal Body Weight:  52.27 kg  BMI:  Body  mass index is 21.26 kg/m.  Estimated Nutritional Needs:  Kcal:  1650-1800 kcals (30-33 kcal/kg bw) Protein:  76-87g prp (1.4-1.6g/kg bw) Fluid:  >1.7 L (30 ml/kg bw)  Burtis Junes RD, LDN, CNSC Clinical Nutrition Available Tues-Sat via Pager: [phone] 09/08/2018 2:27 PM

## 2018-09-08 NOTE — Progress Notes (Signed)
Sputum collected

## 2018-09-08 NOTE — Consult Note (Signed)
Consult requested by: [facility] hospitalist, Dr. Alfredia Ferguson Consult requested for: Respiratory failure/COPD exacerbation/healthcare associated pneumonia  HPI: This is a 58 year old with history of COPD and pulmonary fibrosis anxiety, bipolar disease, chronic pain, history of narcotic abuse.  She says she has been sick since January.  She is been coughing and congested.  She is coughing up sputum.  She is having chest pain because of her cough.  She is much more short of breath.  She was in the hospital in April and in February.  She is having much more shortness of breath.  She denies any chest pain except is associated with her cough.  She is coughing up some sputum.  She is not sure if she has had fever.  No nausea vomiting diarrhea.  No neurological symptoms.  No urinary symptoms.  Past Medical History:  Diagnosis Date  . ADD (attention deficit disorder)   . Anxiety   . Bipolar disorder ([place])   . Chronic pain   . Depression   . Narcotic abuse in remission ([place])   . Schizophrenia ([place])   . Seizures ([place])      No family history on file.   Social History   Socioeconomic History  . Marital status: Single    Spouse name: Not on file  . Number of children: Not on file  . Years of education: Not on file  . Highest education level: Not on file  Occupational History  . Not on file  Social Needs  . Financial resource strain: Not on file  . Food insecurity:    Worry: Not on file    Inability: Not on file  . Transportation needs:    Medical: Not on file    Non-medical: Not on file  Tobacco Use  . Smoking status: Former Smoker    Packs/day: 0.50  . Smokeless tobacco: Never Used  Substance and Sexual Activity  . Alcohol use: No  . Drug use: No  . Sexual activity: Never  Lifestyle  . Physical activity:    Days per week: Not on file    Minutes per session: Not on file  . Stress: Not on file  Relationships  . Social connections:    Talks on phone: Not on file    Gets together: Not on  file    Attends religious service: Not on file    Active member of club or organization: Not on file    Attends meetings of clubs or organizations: Not on file    Relationship status: Not on file  Other Topics Concern  . Not on file  Social History Narrative  . Not on file     ROS: Except as mentioned 10 point review of systems is negative    Objective: Vital signs in last 24 hours: Temp:  [97.7 F (36.5 C)-98.1 F (36.7 C)] 98.1 F (36.7 C) (06/06 0501) Pulse Rate:  [98-132] 98 (06/06 0501) Resp:  [18-28] 18 (06/06 0501) BP: (123-152)/(86-117) 152/94 (06/06 0501) SpO2:  [88 %-100 %] 94 % (06/06 0827) Weight:  [54.4 kg] 54.4 kg (06/05 1432) Weight change:  Last BM Date: 09/07/18  Intake/Output from previous day: 06/05 0701 - 06/06 0700 In: 13 [P.O.:720; IV Piggyback:100] Out: 900 [Urine:900]  PHYSICAL EXAM Constitutional: She is awake and alert and anxious.  Eyes: Pupils react EOMI.  Ears nose mouth and throat: Her mucous membranes are slightly dry.  Hearing is grossly normal.  Cardiovascular: Her heart is regular with normal heart sounds but diminished.  Respiratory: She  is coughing during the exam and has extensive bilateral rhonchi.  Gastrointestinal: Her abdomen soft with no masses.  Musculoskeletal: Grossly normal strength bilaterally.  Neurological: No focal abnormalities.  Psychiatric: She is anxious  Lab Results: Basic Metabolic Panel: Recent Labs    09/07/18 1556 09/08/18 0555  NA 142 137  K 3.0* 3.5  CL 106 100  CO2 26 24  GLUCOSE 93 159*  BUN 7 6  CREATININE 0.52 0.53  CALCIUM 8.8* 8.8*   Liver Function Tests: Recent Labs    09/07/18 1556  AST 21  ALT 10  ALKPHOS 123  BILITOT 0.4  PROT 6.6  ALBUMIN 2.8*   No results for input(s): LIPASE, AMYLASE in the last 72 hours. No results for input(s): AMMONIA in the last 72 hours. CBC: Recent Labs    09/07/18 1556 09/08/18 0555  WBC 13.1* 9.1  NEUTROABS 6.6  --   HGB 11.6* 11.3*  HCT 36.5  35.3*  MCV 98.1 96.7  PLT 535* 505*   Cardiac Enzymes: Recent Labs    09/07/18 1556  TROPONINI <0.03   BNP: No results for input(s): PROBNP in the last 72 hours. D-Dimer: No results for input(s): DDIMER in the last 72 hours. CBG: No results for input(s): GLUCAP in the last 72 hours. Hemoglobin A1C: No results for input(s): HGBA1C in the last 72 hours. Fasting Lipid Panel: No results for input(s): CHOL, HDL, LDLCALC, TRIG, CHOLHDL, LDLDIRECT in the last 72 hours. Thyroid Function Tests: No results for input(s): TSH, T4TOTAL, FREET4, T3FREE, THYROIDAB in the last 72 hours. Anemia Panel: No results for input(s): VITAMINB12, FOLATE, FERRITIN, TIBC, IRON, RETICCTPCT in the last 72 hours. Coagulation: No results for input(s): LABPROT, INR in the last 72 hours. Urine Drug Screen: Drugs of Abuse     Component Value Date/Time   LABOPIA NONE DETECTED 05/20/2018 0235   COCAINSCRNUR NONE DETECTED 05/20/2018 0235   LABBENZ POSITIVE (A) 05/20/2018 0235   AMPHETMU NONE DETECTED 05/20/2018 0235   THCU POSITIVE (A) 05/20/2018 0235   LABBARB NONE DETECTED 05/20/2018 0235    Alcohol Level: No results for input(s): ETH in the last 72 hours. Urinalysis: No results for input(s): COLORURINE, LABSPEC, PHURINE, GLUCOSEU, HGBUR, BILIRUBINUR, KETONESUR, PROTEINUR, UROBILINOGEN, NITRITE, LEUKOCYTESUR in the last 72 hours.  Invalid input(s): APPERANCEUR Misc. Labs:   ABGS: No results for input(s): PHART, PO2ART, TCO2, HCO3 in the last 72 hours.  Invalid input(s): PCO2   MICROBIOLOGY: Recent Results (from the past 240 hour(s))  SARS Coronavirus 2 (CEPHEID- Performed in [facility] hospital lab), Hosp Order     Status: None   Collection Time: 09/07/18  3:56 PM  Result Value Ref Range Status   SARS Coronavirus 2 NEGATIVE NEGATIVE Final    Comment: (NOTE) If result is NEGATIVE SARS-CoV-2 target nucleic acids are NOT DETECTED. The SARS-CoV-2 RNA is generally detectable in upper and lower   respiratory specimens during the acute phase of infection. The lowest  concentration of SARS-CoV-2 viral copies this assay can detect is 250  copies / mL. A negative result does not preclude SARS-CoV-2 infection  and should not be used as the sole basis for treatment or other  patient management decisions.  A negative result may occur with  improper specimen collection / handling, submission of specimen other  than nasopharyngeal swab, presence of viral mutation(s) within the  areas targeted by this assay, and inadequate number of viral copies  (<250 copies / mL). A negative result must be combined with clinical  observations, patient history,  and epidemiological information. If result is POSITIVE SARS-CoV-2 target nucleic acids are DETECTED. The SARS-CoV-2 RNA is generally detectable in upper and lower  respiratory specimens dur ing the acute phase of infection.  Positive  results are indicative of active infection with SARS-CoV-2.  Clinical  correlation with patient history and other diagnostic information is  necessary to determine patient infection status.  Positive results do  not rule out bacterial infection or co-infection with other viruses. If result is PRESUMPTIVE POSTIVE SARS-CoV-2 nucleic acids MAY BE PRESENT.   A presumptive positive result was obtained on the submitted specimen  and confirmed on repeat testing.  While 2019 novel coronavirus  (SARS-CoV-2) nucleic acids may be present in the submitted sample  additional confirmatory testing may be necessary for epidemiological  and / or clinical management purposes  to differentiate between  SARS-CoV-2 and other Sarbecovirus currently known to infect humans.  If clinically indicated additional testing with an alternate test  methodology [phone]) is advised. The SARS-CoV-2 RNA is generally  detectable in upper and lower respiratory sp ecimens during the acute  phase of infection. The expected result is Negative. Fact  Sheet for Patients:  StrictlyIdeas.no Fact Sheet for Healthcare Providers: BankingDealers.co.za This test is not yet approved or cleared by the [place] FDA and has been authorized for detection and/or diagnosis of SARS-CoV-2 by FDA under an Emergency Use Authorization (EUA).  This EUA will remain in effect (meaning this test can be used) for the duration of the COVID-19 declaration under Section 564(b)(1) of the Act, 21 U.S.C. section 360bbb-3(b)(1), unless the authorization is terminated or revoked sooner. Performed at [facility], [address] [place], [place] [address]   MRSA PCR Screening     Status: None   Collection Time: 09/07/18  6:53 PM  Result Value Ref Range Status   MRSA by PCR NEGATIVE NEGATIVE Final    Comment:        The GeneXpert MRSA Assay (FDA approved for NASAL specimens only), is one component of a comprehensive MRSA colonization surveillance program. It is not intended to diagnose MRSA infection nor to guide or monitor treatment for MRSA infections. Performed at [facility], [address] [place], [place] [address]   Culture, blood (routine x 2) Call MD if unable to obtain prior to antibiotics being given     Status: None (Preliminary result)   Collection Time: 09/07/18  9:22 PM  Result Value Ref Range Status   Specimen Description BLOOD LEFT HAND  Final   Special Requests   Final    BOTTLES DRAWN AEROBIC AND ANAEROBIC Blood Culture adequate volume   Culture   Final    NO GROWTH < 12 HOURS Performed at [facility], [address], [place], [place] [address]    Report Status PENDING  Incomplete  Culture, blood (routine x 2) Call MD if unable to obtain prior to antibiotics being given     Status: None (Preliminary result)   Collection Time: 09/07/18  9:34 PM  Result Value Ref Range Status   Specimen Description BLOOD RIGHT ARM  Final   Special Requests   Final    BOTTLES DRAWN AEROBIC AND ANAEROBIC  Blood Culture adequate volume   Culture   Final    NO GROWTH < 12 HOURS Performed at [facility], [address], [place], [place] [address]    Report Status PENDING  Incomplete    Studies/Results: Dg Chest Port 1 View  Result Date: 09/07/2018 CLINICAL DATA:  Cough with shortness  of breath. EXAM: PORTABLE CHEST 1 VIEW COMPARISON:  08/01/2018. FINDINGS: The heart is enlarged. On a background of chronic interstitial lung disease, there is asymmetric increased opacity in the RIGHT mid and lower lung zones which could represent infection or edema. No effusion or pneumothorax. Bones unremarkable. IMPRESSION: Cardiomegaly. Asymmetric increased opacity in the RIGHT mid and lower lung zones which could represent infection or edema. Worsening aeration. Chronic interstitial lung disease. Correlate clinically for pulmonary edema versus early RIGHT lung infiltrate. Electronically Signed   By: Staci Righter M.D.   On: 09/07/2018 17:14    Medications:  Prior to Admission:  Medications Prior to Admission  Medication Sig Dispense Refill Last Dose  . acetaminophen (TYLENOL) 500 MG tablet Take 1 tablet (500 mg total) by mouth every 8 (eight) hours as needed for moderate pain. 30 tablet 0 Past Week at Unknown time  . apixaban (ELIQUIS) 5 MG TABS tablet Take 1 tablet (5 mg total) by mouth 2 (two) times daily. 2 pills mouth twice a day till 06/12/2018 then switch to 1 pill twice a day from 06/13/2018 (Patient taking differently: Take 10 mg by mouth 2 (two) times daily. ) 60 tablet 0 09/07/2018 at 800  . cetirizine (ZYRTEC) 10 MG tablet Take 10 mg by mouth daily.   09/07/2018 at Unknown time  . FLUoxetine (PROZAC) 40 MG capsule Take 1 capsule (40 mg total) by mouth daily. For depression and anxiety. 30 capsule 0 09/07/2018 at Unknown time  . Fluticasone-Umeclidin-Vilant (TRELEGY ELLIPTA) 100-62.5-25 MCG/INH AEPB Inhale 1 puff into the lungs daily.   09/06/2018 at Unknown time  . guaiFENesin (MUCINEX) 600 MG 12 hr tablet Take  800 mg by mouth 2 (two) times daily.    09/07/2018 at Unknown time  . Melatonin 5 MG CAPS Take 20 mg by mouth at bedtime.    09/06/2018 at Unknown time  . pantoprazole (PROTONIX) 40 MG tablet Take 1 tablet (40 mg total) by mouth 2 (two) times daily. 60 tablet 0 09/07/2018 at Unknown time  . Probiotic Product (PROBIOTIC PO) Take 1 tablet by mouth daily. Women's probiotic for GI health   09/07/2018 at Unknown time  . lactose free nutrition (BOOST PLUS) LIQD Take 237 mLs by mouth 3 (three) times daily with meals. (Patient not taking: Reported on 07/27/2018) 30 Can 0 Not Taking at Unknown time  . metoprolol tartrate (LOPRESSOR) 25 MG tablet Take 1 tablet (25 mg total) by mouth 2 (two) times daily. (Patient not taking: Reported on 07/27/2018) 60 tablet 0 Not Taking at Unknown time  . ziprasidone (GEODON) 80 MG capsule Take one capsule at bedtime for depression and mood stability. (Patient not taking: Reported on 07/27/2018) 30 capsule 0 Not Taking at Unknown time   Scheduled: . apixaban  5 mg Oral BID  . feeding supplement (ENSURE ENLIVE)  237 mL Oral TID WC  . FLUoxetine  40 mg Oral Daily  . fluticasone furoate-vilanterol  1 puff Inhalation Daily   And  . umeclidinium bromide  1 puff Inhalation Daily  . guaiFENesin  1,200 mg Oral BID  . ipratropium-albuterol  3 mL Nebulization Q6H  . methylPREDNISolone (SOLU-MEDROL) injection  60 mg Intravenous Q6H  . pantoprazole  40 mg Oral BID  . pneumococcal 23 valent vaccine  0.5 mL Intramuscular Tomorrow-1000   Continuous: . ceFEPime (MAXIPIME) IV    . vancomycin 500 mg (09/08/18 0625)   EXH:BZJIRCVEL, guaiFENesin-dextromethorphan, HYDROcodone-acetaminophen  Assesment: She has acute on chronic hypoxic respiratory failure.  This is related to COPD pulmonary  fibrosis and healthcare associated pneumonia.  I personally reviewed chest x-ray from yesterday and chest x-ray from today is pending.  She is still very short of breath.  She is coughing.  She is coughing up  sputum.  She is complaining of chest pain I think related to her cough.  Her situation is complicated by bipolar disease and extreme anxiety Principal Problem:   Acute on chronic respiratory failure with hypoxia ([place]) Active Problems:   Narcotic abuse in remission ([place])   Bipolar disorder ([place])   HCAP (healthcare-associated pneumonia)   History of DVT (deep vein thrombosis)    Plan: She is on appropriate treatment.  Would continue with IV steroids IV antibiotics bronchodilators.  A flutter valve has been ordered but apparently not delivered yet so I have reordered that    LOS: 1 day   Alonza Bogus 09/08/2018, 9:22 AM

## 2018-09-09 ENCOUNTER — Inpatient Hospital Stay (HOSPITAL_COMMUNITY): Payer: Medicare Other

## 2018-09-09 DIAGNOSIS — R441 Visual hallucinations: Secondary | ICD-10-CM

## 2018-09-09 LAB — CBC WITH DIFFERENTIAL/PLATELET
Abs Immature Granulocytes: 0.46 10*3/uL — ABNORMAL HIGH (ref 0.00–0.07)
Basophils Absolute: 0 10*3/uL (ref 0.0–0.1)
Basophils Relative: 0 %
Eosinophils Absolute: 0 10*3/uL (ref 0.0–0.5)
Eosinophils Relative: 0 %
HCT: 35.2 % — ABNORMAL LOW (ref 36.0–46.0)
Hemoglobin: 11.2 g/dL — ABNORMAL LOW (ref 12.0–15.0)
Immature Granulocytes: 2 %
Lymphocytes Relative: 9 %
Lymphs Abs: 2.3 10*3/uL (ref 0.7–4.0)
MCH: 30.4 pg (ref 26.0–34.0)
MCHC: 31.8 g/dL (ref 30.0–36.0)
MCV: 95.4 fL (ref 80.0–100.0)
Monocytes Absolute: 2 10*3/uL — ABNORMAL HIGH (ref 0.1–1.0)
Monocytes Relative: 8 %
Neutro Abs: 21.1 10*3/uL — ABNORMAL HIGH (ref 1.7–7.7)
Neutrophils Relative %: 81 %
Platelets: 592 10*3/uL — ABNORMAL HIGH (ref 150–400)
RBC: 3.69 MIL/uL — ABNORMAL LOW (ref 3.87–5.11)
RDW: 14.3 % (ref 11.5–15.5)
WBC: 25.9 10*3/uL — ABNORMAL HIGH (ref 4.0–10.5)
nRBC: 0 % (ref 0.0–0.2)

## 2018-09-09 LAB — MAGNESIUM: Magnesium: 1.9 mg/dL (ref 1.7–2.4)

## 2018-09-09 LAB — COMPREHENSIVE METABOLIC PANEL
ALT: 11 U/L (ref 0–44)
AST: 18 U/L (ref 15–41)
Albumin: 2.9 g/dL — ABNORMAL LOW (ref 3.5–5.0)
Alkaline Phosphatase: 104 U/L (ref 38–126)
Anion gap: 15 (ref 5–15)
BUN: 11 mg/dL (ref 6–20)
CO2: 28 mmol/L (ref 22–32)
Calcium: 8.9 mg/dL (ref 8.9–10.3)
Chloride: 99 mmol/L (ref 98–111)
Creatinine, Ser: 0.56 mg/dL (ref 0.44–1.00)
GFR calc Af Amer: >60 mL/min (ref >60)
GFR calc non Af Amer: >60 mL/min (ref >60)
Glucose, Bld: 137 mg/dL — ABNORMAL HIGH (ref 70–99)
Potassium: 3.3 mmol/L — ABNORMAL LOW (ref 3.5–5.1)
Sodium: 142 mmol/L (ref 135–145)
Total Bilirubin: 0.3 mg/dL (ref 0.3–1.2)
Total Protein: 6.6 g/dL (ref 6.5–8.1)

## 2018-09-09 LAB — PHOSPHORUS: Phosphorus: 3.3 mg/dL (ref 2.5–4.6)

## 2018-09-09 LAB — HIV ANTIBODY (ROUTINE TESTING W REFLEX): HIV Screen 4th Generation wRfx: NONREACTIVE

## 2018-09-09 LAB — PROCALCITONIN: Procalcitonin: 1.93 ng/mL

## 2018-09-09 MED ORDER — HYDROXYZINE HCL 25 MG PO TABS
50.0000 mg | ORAL_TABLET | Freq: Three times a day (TID) | ORAL | Status: DC | PRN
  Administered 2018-09-09 – 2018-09-12 (×2): 50 mg via ORAL
  Filled 2018-09-09 (×4): qty 2

## 2018-09-09 MED ORDER — LIDOCAINE 5 % EX PTCH
1.0000 | MEDICATED_PATCH | CUTANEOUS | Status: DC
  Administered 2018-09-09 – 2018-09-13 (×3): 1 via TRANSDERMAL
  Filled 2018-09-09 (×4): qty 1

## 2018-09-09 MED ORDER — METHYLPREDNISOLONE SODIUM SUCC 125 MG IJ SOLR
60.0000 mg | Freq: Two times a day (BID) | INTRAMUSCULAR | Status: DC
  Administered 2018-09-09 – 2018-09-10 (×3): 60 mg via INTRAVENOUS
  Filled 2018-09-09 (×3): qty 2

## 2018-09-09 MED ORDER — ACETAMINOPHEN 325 MG PO TABS
650.0000 mg | ORAL_TABLET | Freq: Four times a day (QID) | ORAL | Status: DC | PRN
  Administered 2018-09-10 – 2018-09-13 (×12): 650 mg via ORAL
  Filled 2018-09-09 (×12): qty 2

## 2018-09-09 MED ORDER — DICLOFENAC SODIUM 1 % TD GEL
2.0000 g | Freq: Four times a day (QID) | TRANSDERMAL | Status: DC
  Filled 2018-09-09: qty 100

## 2018-09-09 MED ORDER — ZIPRASIDONE HCL 40 MG PO CAPS: 40.0000 mg | ORAL_CAPSULE | Freq: Two times a day (BID) | ORAL | Status: DC

## 2018-09-09 MED ORDER — BENZONATATE 100 MG PO CAPS
100.0000 mg | ORAL_CAPSULE | Freq: Three times a day (TID) | ORAL | Status: DC
  Administered 2018-09-09 – 2018-09-10 (×3): 100 mg via ORAL
  Filled 2018-09-09 (×7): qty 1

## 2018-09-09 MED ORDER — HALOPERIDOL LACTATE 5 MG/ML IJ SOLN
5.0000 mg | Freq: Once | INTRAMUSCULAR | Status: AC
  Administered 2018-09-10: 5 mg via INTRAVENOUS
  Filled 2018-09-09: qty 1

## 2018-09-09 MED ORDER — OXYCODONE HCL 5 MG PO TABS
5.0000 mg | ORAL_TABLET | Freq: Four times a day (QID) | ORAL | Status: DC | PRN
  Administered 2018-09-09 (×2): 10 mg via ORAL
  Administered 2018-09-10 (×2): 5 mg via ORAL
  Administered 2018-09-10: 10 mg via ORAL
  Administered 2018-09-10 – 2018-09-11 (×2): 5 mg via ORAL
  Administered 2018-09-11: 10 mg via ORAL
  Administered 2018-09-11 (×2): 5 mg via ORAL
  Administered 2018-09-12 – 2018-09-13 (×6): 10 mg via ORAL
  Filled 2018-09-09 (×17): qty 2

## 2018-09-09 MED ORDER — ZIPRASIDONE HCL 40 MG PO CAPS
40.0000 mg | ORAL_CAPSULE | Freq: Two times a day (BID) | ORAL | Status: DC
  Administered 2018-09-09 – 2018-09-13 (×3): 40 mg via ORAL
  Filled 2018-09-09 (×11): qty 1

## 2018-09-09 MED ORDER — METHYLPREDNISOLONE SODIUM SUCC 125 MG IJ SOLR: 60.0000 mg | Freq: Three times a day (TID) | INTRAMUSCULAR | Status: DC

## 2018-09-09 NOTE — [facility] Counselor (Signed)
Per attending nurse, Pt is not medically cleared.  However, she may be experiencing a psychotic break.  Attending physician asked for NP consult for meds.

## 2018-09-09 NOTE — Progress Notes (Signed)
BH called and asked me to d/c TTS order. They will evaluate her when she is medically cleared.

## 2018-09-09 NOTE — Progress Notes (Signed)
Subjective: She says she feels better.  Events of last night noted.  She is alert and oriented now.  Her breathing is better.  She is coughing up some sputum.  Objective: Vital signs in last 24 hours: Temp:  [98.1 F (36.7 C)-98.2 F (36.8 C)] 98.1 F (36.7 C) (06/07 0541) Pulse Rate:  [103-125] 103 (06/07 0541) Resp:  [18-20] 18 (06/07 0541) BP: (134-144)/(91-94) 141/94 (06/07 0541) SpO2:  [95 %-100 %] 98 % (06/07 0812) FiO2 (%):  [21 %] 21 % (06/06 1942) Weight change:  Last BM Date: 09/08/18  Intake/Output from previous day: 06/06 0701 - 06/07 0700 In: 1259.9 [P.O.:960; IV Piggyback:299.9] Out: 2300 [Urine:2300]  PHYSICAL EXAM General appearance: alert, cooperative and mild distress Resp: She has bilateral rales and rhonchi Cardio: regular rate and rhythm, S1, S2 normal, no murmur, click, rub or gallop GI: soft, non-tender; bowel sounds normal; no masses,  no organomegaly Extremities: extremities normal, atraumatic, no cyanosis or edema  Lab Results:  Results for orders placed or performed during the hospital encounter of 09/07/18 (from the past 48 hour(s))  Comprehensive metabolic panel     Status: Abnormal   Collection Time: 09/07/18  3:56 PM  Result Value Ref Range   Sodium 142 135 - 145 mmol/L   Potassium 3.0 (L) 3.5 - 5.1 mmol/L   Chloride 106 98 - 111 mmol/L   CO2 26 22 - 32 mmol/L   Glucose, Bld 93 70 - 99 mg/dL   BUN 7 6 - 20 mg/dL   Creatinine, Ser 0.52 0.44 - 1.00 mg/dL   Calcium 8.8 (L) 8.9 - 10.3 mg/dL   Total Protein 6.6 6.5 - 8.1 g/dL   Albumin 2.8 (L) 3.5 - 5.0 g/dL   AST 21 15 - 41 U/L   ALT 10 0 - 44 U/L   Alkaline Phosphatase 123 38 - 126 U/L   Total Bilirubin 0.4 0.3 - 1.2 mg/dL   GFR calc non Af Amer >60 >60 mL/min   GFR calc Af Amer >60 >60 mL/min   Anion gap 10 5 - 15    Comment: Performed at [facility], [address], [place], [place] [address]  CBC with Differential     Status: Abnormal   Collection Time: 09/07/18  3:56 PM  Result  Value Ref Range   WBC 13.1 (H) 4.0 - 10.5 K/uL   RBC 3.72 (L) 3.87 - 5.11 MIL/uL   Hemoglobin 11.6 (L) 12.0 - 15.0 g/dL   HCT 36.5 36.0 - 46.0 %   MCV 98.1 80.0 - 100.0 fL   MCH 31.2 26.0 - 34.0 pg   MCHC 31.8 30.0 - 36.0 g/dL   RDW 14.6 11.5 - 15.5 %   Platelets 535 (H) 150 - 400 K/uL   nRBC 0.0 0.0 - 0.2 %   Neutrophils Relative % 51 %   Neutro Abs 6.6 1.7 - 7.7 K/uL   Lymphocytes Relative 30 %   Lymphs Abs 4.0 0.7 - 4.0 K/uL   Monocytes Relative 9 %   Monocytes Absolute 1.2 (H) 0.1 - 1.0 K/uL   Eosinophils Relative 9 %   Eosinophils Absolute 1.2 (H) 0.0 - 0.5 K/uL   Basophils Relative 1 %   Basophils Absolute 0.1 0.0 - 0.1 K/uL   Immature Granulocytes 0 %   Abs Immature Granulocytes 0.05 0.00 - 0.07 K/uL    Comment: Performed at [facility], [address] [place], [place] [address]  Troponin I - Once     Status: None  Collection Time: 09/07/18  3:56 PM  Result Value Ref Range   Troponin I <0.03 <0.03 ng/mL    Comment: Performed at [facility], [address], [place], [place] [address]  Brain natriuretic peptide     Status: None   Collection Time: 09/07/18  3:56 PM  Result Value Ref Range   B Natriuretic Peptide 69.0 0.0 - 100.0 pg/mL    Comment: Performed at [facility], [address], [place], [place] [address]  SARS Coronavirus 2 (CEPHEID- Performed in [place] hospital lab), Hosp Order     Status: None   Collection Time: 09/07/18  3:56 PM  Result Value Ref Range   SARS Coronavirus 2 NEGATIVE NEGATIVE    Comment: (NOTE) If result is NEGATIVE SARS-CoV-2 target nucleic acids are NOT DETECTED. The SARS-CoV-2 RNA is generally detectable in upper and lower  respiratory specimens during the acute phase of infection. The lowest  concentration of SARS-CoV-2 viral copies this assay can detect is 250  copies / mL. A negative result does not preclude SARS-CoV-2 infection  and should not be used as the sole basis for treatment or other  patient management decisions.  A  negative result may occur with  improper specimen collection / handling, submission of specimen other  than nasopharyngeal swab, presence of viral mutation(s) within the  areas targeted by this assay, and inadequate number of viral copies  (<250 copies / mL). A negative result must be combined with clinical  observations, patient history, and epidemiological information. If result is POSITIVE SARS-CoV-2 target nucleic acids are DETECTED. The SARS-CoV-2 RNA is generally detectable in upper and lower  respiratory specimens dur ing the acute phase of infection.  Positive  results are indicative of active infection with SARS-CoV-2.  Clinical  correlation with patient history and other diagnostic information is  necessary to determine patient infection status.  Positive results do  not rule out bacterial infection or co-infection with other viruses. If result is PRESUMPTIVE POSTIVE SARS-CoV-2 nucleic acids MAY BE PRESENT.   A presumptive positive result was obtained on the submitted specimen  and confirmed on repeat testing.  While 2019 novel coronavirus  (SARS-CoV-2) nucleic acids may be present in the submitted sample  additional confirmatory testing may be necessary for epidemiological  and / or clinical management purposes  to differentiate between  SARS-CoV-2 and other Sarbecovirus currently known to infect humans.  If clinically indicated additional testing with an alternate test  methodology [phone]) is advised. The SARS-CoV-2 RNA is generally  detectable in upper and lower respiratory sp ecimens during the acute  phase of infection. The expected result is Negative. Fact Sheet for Patients:  StrictlyIdeas.no Fact Sheet for Healthcare Providers: BankingDealers.co.za This test is not yet approved or cleared by the [place] FDA and has been authorized for detection and/or diagnosis of SARS-CoV-2 by FDA under an Emergency Use  Authorization (EUA).  This EUA will remain in effect (meaning this test can be used) for the duration of the COVID-19 declaration under Section 564(b)(1) of the Act, 21 U.S.C. section 360bbb-3(b)(1), unless the authorization is terminated or revoked sooner. Performed at [facility], [address], [place], [place] [address]   Procalcitonin - Baseline     Status: None   Collection Time: 09/07/18  3:56 PM  Result Value Ref Range   Procalcitonin 10.58 ng/mL    Comment:        Interpretation: PCT >= 10 ng/mL: Important systemic inflammatory response, almost exclusively due to severe bacterial sepsis or  septic shock. (NOTE)       Sepsis PCT Algorithm           Lower Respiratory Tract                                      Infection PCT Algorithm    ----------------------------     ----------------------------         PCT < 0.25 ng/mL                PCT < 0.10 ng/mL         Strongly encourage             Strongly discourage   discontinuation of antibiotics    initiation of antibiotics    ----------------------------     -----------------------------       PCT 0.25 - 0.50 ng/mL            PCT 0.10 - 0.25 ng/mL               OR       >80% decrease in PCT            Discourage initiation of                                            antibiotics      Encourage discontinuation           of antibiotics    ----------------------------     -----------------------------         PCT >= 0.50 ng/mL              PCT 0.26 - 0.50 ng/mL                AND       <80% decrease in PCT             Encourage initiation of                                             antibiotics       Encourage continuation           of antibiotics    ----------------------------     -----------------------------        PCT >= 0.50 ng/mL                  PCT > 0.50 ng/mL               AND         increase in PCT                  Strongly encourage                                      initiation of antibiotics     Strongly encourage escalation           of antibiotics                                     -----------------------------  PCT <= 0.25 ng/mL                                                 OR                                        > 80% decrease in PCT                                     Discontinue / Do not initiate                                             antibiotics Performed at [facility], [address], [place], [place] [address]   MRSA PCR Screening     Status: None   Collection Time: 09/07/18  6:53 PM  Result Value Ref Range   MRSA by PCR NEGATIVE NEGATIVE    Comment:        The GeneXpert MRSA Assay (FDA approved for NASAL specimens only), is one component of a comprehensive MRSA colonization surveillance program. It is not intended to diagnose MRSA infection nor to guide or monitor treatment for MRSA infections. Performed at [facility], [address] [place], [place] [address]   Strep pneumoniae urinary antigen     Status: None   Collection Time: 09/07/18  8:29 PM  Result Value Ref Range   Strep Pneumo Urinary Antigen NEGATIVE NEGATIVE    Comment: PERFORMED AT [facility]        Infection due to S. pneumoniae cannot be absolutely ruled out since the antigen present may be below the detection limit of the test. Performed at [facility], [address], [place], [place] [address]   Culture, blood (routine x 2) Call MD if unable to obtain prior to antibiotics being given     Status: None (Preliminary result)   Collection Time: 09/07/18  9:22 PM  Result Value Ref Range   Specimen Description BLOOD LEFT HAND    Special Requests      BOTTLES DRAWN AEROBIC AND ANAEROBIC Blood Culture adequate volume   Culture      NO GROWTH 2 DAYS Performed at [facility], [address] [place], [place] [address]    Report Status PENDING   Culture, blood (routine x 2) Call MD if unable to obtain prior to antibiotics  being given     Status: None (Preliminary result)   Collection Time: 09/07/18  9:34 PM  Result Value Ref Range   Specimen Description BLOOD RIGHT ARM    Special Requests      BOTTLES DRAWN AEROBIC AND ANAEROBIC Blood Culture adequate volume   Culture      NO GROWTH 2 DAYS Performed at [facility], [address] [place], [place] [address]    Report Status PENDING   Procalcitonin     Status: None   Collection Time: 09/08/18  5:55 AM  Result Value Ref Range   Procalcitonin 5.15 ng/mL    Comment:        Interpretation: PCT > 2  ng/mL: Systemic infection (sepsis) is likely, unless other causes are known. (NOTE)       Sepsis PCT Algorithm           Lower Respiratory Tract                                      Infection PCT Algorithm    ----------------------------     ----------------------------         PCT < 0.25 ng/mL                PCT < 0.10 ng/mL         Strongly encourage             Strongly discourage   discontinuation of antibiotics    initiation of antibiotics    ----------------------------     -----------------------------       PCT 0.25 - 0.50 ng/mL            PCT 0.10 - 0.25 ng/mL               OR       >80% decrease in PCT            Discourage initiation of                                            antibiotics      Encourage discontinuation           of antibiotics    ----------------------------     -----------------------------         PCT >= 0.50 ng/mL              PCT 0.26 - 0.50 ng/mL               AND       <80% decrease in PCT              Encourage initiation of                                             antibiotics       Encourage continuation           of antibiotics    ----------------------------     -----------------------------        PCT >= 0.50 ng/mL                  PCT > 0.50 ng/mL               AND         increase in PCT                  Strongly encourage                                      initiation of antibiotics    Strongly  encourage escalation           of antibiotics                                     -----------------------------  PCT <= 0.25 ng/mL                                                 OR                                        > 80% decrease in PCT                                     Discontinue / Do not initiate                                             antibiotics Performed at [facility], [address] [place], [place] [address]   Basic metabolic panel     Status: Abnormal   Collection Time: 09/08/18  5:55 AM  Result Value Ref Range   Sodium 137 135 - 145 mmol/L   Potassium 3.5 3.5 - 5.1 mmol/L   Chloride 100 98 - 111 mmol/L   CO2 24 22 - 32 mmol/L   Glucose, Bld 159 (H) 70 - 99 mg/dL   BUN 6 6 - 20 mg/dL   Creatinine, Ser 0.53 0.44 - 1.00 mg/dL   Calcium 8.8 (L) 8.9 - 10.3 mg/dL   GFR calc non Af Amer >60 >60 mL/min   GFR calc Af Amer >60 >60 mL/min   Anion gap 13 5 - 15    Comment: Performed at [facility], [address], [place], [place] [address]  CBC     Status: Abnormal   Collection Time: 09/08/18  5:55 AM  Result Value Ref Range   WBC 9.1 4.0 - 10.5 K/uL   RBC 3.65 (L) 3.87 - 5.11 MIL/uL   Hemoglobin 11.3 (L) 12.0 - 15.0 g/dL   HCT 35.3 (L) 36.0 - 46.0 %   MCV 96.7 80.0 - 100.0 fL   MCH 31.0 26.0 - 34.0 pg   MCHC 32.0 30.0 - 36.0 g/dL   RDW 14.2 11.5 - 15.5 %   Platelets 505 (H) 150 - 400 K/uL   nRBC 0.0 0.0 - 0.2 %    Comment: Performed at [facility], [address] [place], [place] [address]  Respiratory Panel by PCR     Status: None   Collection Time: 09/08/18  1:00 PM  Result Value Ref Range   Adenovirus NOT DETECTED NOT DETECTED   Coronavirus 229E NOT DETECTED NOT DETECTED    Comment: (NOTE) The Coronavirus on the Respiratory Panel, DOES NOT test for the novel  Coronavirus (2019 nCoV)    Coronavirus HKU1 NOT DETECTED NOT DETECTED   Coronavirus NL63 NOT DETECTED NOT DETECTED   Coronavirus OC43 NOT DETECTED NOT  DETECTED   Metapneumovirus NOT DETECTED NOT DETECTED   Rhinovirus / Enterovirus NOT DETECTED NOT DETECTED   Influenza A NOT DETECTED NOT DETECTED   Influenza B NOT DETECTED NOT DETECTED   Parainfluenza Virus 1 NOT DETECTED NOT DETECTED   Parainfluenza Virus 2 NOT DETECTED NOT DETECTED   Parainfluenza Virus 3 NOT DETECTED NOT DETECTED   Parainfluenza Virus 4 NOT DETECTED  NOT DETECTED   Respiratory Syncytial Virus NOT DETECTED NOT DETECTED   Bordetella pertussis NOT DETECTED NOT DETECTED   Chlamydophila pneumoniae NOT DETECTED NOT DETECTED   Mycoplasma pneumoniae NOT DETECTED NOT DETECTED    Comment: Performed at [facility] [address] [place], [place] [address]  Urine rapid drug screen (hosp performed)     Status: Abnormal   Collection Time: 09/08/18  5:50 PM  Result Value Ref Range   Opiates POSITIVE (A) NONE DETECTED   Cocaine NONE DETECTED NONE DETECTED   Benzodiazepines NONE DETECTED NONE DETECTED   Amphetamines NONE DETECTED NONE DETECTED   Tetrahydrocannabinol NONE DETECTED NONE DETECTED   Barbiturates NONE DETECTED NONE DETECTED    Comment: (NOTE) DRUG SCREEN FOR MEDICAL PURPOSES ONLY.  IF CONFIRMATION IS NEEDED FOR ANY PURPOSE, NOTIFY LAB WITHIN 5 DAYS. LOWEST DETECTABLE LIMITS FOR URINE DRUG SCREEN Drug Class                     Cutoff (ng/mL) Amphetamine and metabolites    1000 Barbiturate and metabolites    200 Benzodiazepine                 235 Tricyclics and metabolites     300 Opiates and metabolites        300 Cocaine and metabolites        300 THC                            50 Performed at [facility], [address] [place], [place] [address]   CBC with Differential/Platelet     Status: Abnormal   Collection Time: 09/09/18  7:12 AM  Result Value Ref Range   WBC 25.9 (H) 4.0 - 10.5 K/uL    Comment: WHITE COUNT CONFIRMED ON SMEAR   RBC 3.69 (L) 3.87 - 5.11 MIL/uL   Hemoglobin 11.2 (L) 12.0 - 15.0 g/dL   HCT 35.2 (L) 36.0 - 46.0 %   MCV 95.4  80.0 - 100.0 fL   MCH 30.4 26.0 - 34.0 pg   MCHC 31.8 30.0 - 36.0 g/dL   RDW 14.3 11.5 - 15.5 %   Platelets 592 (H) 150 - 400 K/uL   nRBC 0.0 0.0 - 0.2 %   Neutrophils Relative % 81 %   Neutro Abs 21.1 (H) 1.7 - 7.7 K/uL   Lymphocytes Relative 9 %   Lymphs Abs 2.3 0.7 - 4.0 K/uL   Monocytes Relative 8 %   Monocytes Absolute 2.0 (H) 0.1 - 1.0 K/uL   Eosinophils Relative 0 %   Eosinophils Absolute 0.0 0.0 - 0.5 K/uL   Basophils Relative 0 %   Basophils Absolute 0.0 0.0 - 0.1 K/uL   WBC Morphology VACUOLATED NEUTROPHILS    Immature Granulocytes 2 %   Abs Immature Granulocytes 0.46 (H) 0.00 - 0.07 K/uL    Comment: Performed at [facility], [address] [place], [place] [address]    ABGS No results for input(s): PHART, PO2ART, TCO2, HCO3 in the last 72 hours.  Invalid input(s): PCO2 CULTURES Recent Results (from the past 240 hour(s))  SARS Coronavirus 2 (CEPHEID- Performed in [place] hospital lab), Hosp Order     Status: None   Collection Time: 09/07/18  3:56 PM  Result Value Ref Range Status   SARS Coronavirus 2 NEGATIVE NEGATIVE Final    Comment: (NOTE) If result is NEGATIVE SARS-CoV-2 target nucleic acids are NOT DETECTED. The  SARS-CoV-2 RNA is generally detectable in upper and lower  respiratory specimens during the acute phase of infection. The lowest  concentration of SARS-CoV-2 viral copies this assay can detect is 250  copies / mL. A negative result does not preclude SARS-CoV-2 infection  and should not be used as the sole basis for treatment or other  patient management decisions.  A negative result may occur with  improper specimen collection / handling, submission of specimen other  than nasopharyngeal swab, presence of viral mutation(s) within the  areas targeted by this assay, and inadequate number of viral copies  (<250 copies / mL). A negative result must be combined with clinical  observations, patient history, and epidemiological information. If result  is POSITIVE SARS-CoV-2 target nucleic acids are DETECTED. The SARS-CoV-2 RNA is generally detectable in upper and lower  respiratory specimens dur ing the acute phase of infection.  Positive  results are indicative of active infection with SARS-CoV-2.  Clinical  correlation with patient history and other diagnostic information is  necessary to determine patient infection status.  Positive results do  not rule out bacterial infection or co-infection with other viruses. If result is PRESUMPTIVE POSTIVE SARS-CoV-2 nucleic acids MAY BE PRESENT.   A presumptive positive result was obtained on the submitted specimen  and confirmed on repeat testing.  While 2019 novel coronavirus  (SARS-CoV-2) nucleic acids may be present in the submitted sample  additional confirmatory testing may be necessary for epidemiological  and / or clinical management purposes  to differentiate between  SARS-CoV-2 and other Sarbecovirus currently known to infect humans.  If clinically indicated additional testing with an alternate test  methodology [phone]) is advised. The SARS-CoV-2 RNA is generally  detectable in upper and lower respiratory sp ecimens during the acute  phase of infection. The expected result is Negative. Fact Sheet for Patients:  StrictlyIdeas.no Fact Sheet for Healthcare Providers: BankingDealers.co.za This test is not yet approved or cleared by the [place] FDA and has been authorized for detection and/or diagnosis of SARS-CoV-2 by FDA under an Emergency Use Authorization (EUA).  This EUA will remain in effect (meaning this test can be used) for the duration of the COVID-19 declaration under Section 564(b)(1) of the Act, 21 U.S.C. section 360bbb-3(b)(1), unless the authorization is terminated or revoked sooner. Performed at [facility], [address], [place], [place] [address]   MRSA PCR Screening     Status: None   Collection Time:  09/07/18  6:53 PM  Result Value Ref Range Status   MRSA by PCR NEGATIVE NEGATIVE Final    Comment:        The GeneXpert MRSA Assay (FDA approved for NASAL specimens only), is one component of a comprehensive MRSA colonization surveillance program. It is not intended to diagnose MRSA infection nor to guide or monitor treatment for MRSA infections. Performed at [facility], [address] [place], [place] [address]   Culture, blood (routine x 2) Call MD if unable to obtain prior to antibiotics being given     Status: None (Preliminary result)   Collection Time: 09/07/18  9:22 PM  Result Value Ref Range Status   Specimen Description BLOOD LEFT HAND  Final   Special Requests   Final    BOTTLES DRAWN AEROBIC AND ANAEROBIC Blood Culture adequate volume   Culture   Final    NO GROWTH 2 DAYS Performed at [facility], [address], [place], [place] [address]    Report Status PENDING  Incomplete  Culture,  blood (routine x 2) Call MD if unable to obtain prior to antibiotics being given     Status: None (Preliminary result)   Collection Time: 09/07/18  9:34 PM  Result Value Ref Range Status   Specimen Description BLOOD RIGHT ARM  Final   Special Requests   Final    BOTTLES DRAWN AEROBIC AND ANAEROBIC Blood Culture adequate volume   Culture   Final    NO GROWTH 2 DAYS Performed at [facility], [address], [place], [place] [address]    Report Status PENDING  Incomplete  Respiratory Panel by PCR     Status: None   Collection Time: 09/08/18  1:00 PM  Result Value Ref Range Status   Adenovirus NOT DETECTED NOT DETECTED Final   Coronavirus 229E NOT DETECTED NOT DETECTED Final    Comment: (NOTE) The Coronavirus on the Respiratory Panel, DOES NOT test for the novel  Coronavirus (2019 nCoV)    Coronavirus HKU1 NOT DETECTED NOT DETECTED Final   Coronavirus NL63 NOT DETECTED NOT DETECTED Final   Coronavirus OC43 NOT DETECTED NOT DETECTED Final   Metapneumovirus NOT DETECTED NOT  DETECTED Final   Rhinovirus / Enterovirus NOT DETECTED NOT DETECTED Final   Influenza A NOT DETECTED NOT DETECTED Final   Influenza B NOT DETECTED NOT DETECTED Final   Parainfluenza Virus 1 NOT DETECTED NOT DETECTED Final   Parainfluenza Virus 2 NOT DETECTED NOT DETECTED Final   Parainfluenza Virus 3 NOT DETECTED NOT DETECTED Final   Parainfluenza Virus 4 NOT DETECTED NOT DETECTED Final   Respiratory Syncytial Virus NOT DETECTED NOT DETECTED Final   Bordetella pertussis NOT DETECTED NOT DETECTED Final   Chlamydophila pneumoniae NOT DETECTED NOT DETECTED Final   Mycoplasma pneumoniae NOT DETECTED NOT DETECTED Final    Comment: Performed at [facility], [address], [place], [place] [address]   Studies/Results: Dg Chest Port 1 View  Result Date: 09/08/2018 CLINICAL DATA:  Pneumonia twice this year with current difficulty breathing with cough. EXAM: PORTABLE CHEST 1 VIEW COMPARISON:  09/07/2018, 05/19/2018 and chest CT 05/23/2018 FINDINGS: Lungs are somewhat hypoinflated demonstrate a background of diffuse interstitial disease compatible with known fibrosis. There is mild persistent hazy bilateral central lung opacification which may be due to superimposed edema versus infection. No effusion. Cardiomediastinal silhouette and remainder of the exam is unchanged. IMPRESSION: Persistent hazy central lung opacification which may be due to mild interstitial edema versus acute infection on background of chronic fibrosis. Electronically Signed   By: Marin Olp M.D.   On: 09/08/2018 13:28   Dg Chest Port 1 View  Result Date: 09/07/2018 CLINICAL DATA:  Cough with shortness of breath. EXAM: PORTABLE CHEST 1 VIEW COMPARISON:  08/01/2018. FINDINGS: The heart is enlarged. On a background of chronic interstitial lung disease, there is asymmetric increased opacity in the RIGHT mid and lower lung zones which could represent infection or edema. No effusion or pneumothorax. Bones unremarkable. IMPRESSION:  Cardiomegaly. Asymmetric increased opacity in the RIGHT mid and lower lung zones which could represent infection or edema. Worsening aeration. Chronic interstitial lung disease. Correlate clinically for pulmonary edema versus early RIGHT lung infiltrate. Electronically Signed   By: Staci Righter M.D.   On: 09/07/2018 17:14    Medications:  Prior to Admission:  Medications Prior to Admission  Medication Sig Dispense Refill Last Dose  . acetaminophen (TYLENOL) 500 MG tablet Take 1 tablet (500 mg total) by mouth every 8 (eight) hours as needed for moderate pain. 30 tablet 0  Past Week at Unknown time  . apixaban (ELIQUIS) 5 MG TABS tablet Take 1 tablet (5 mg total) by mouth 2 (two) times daily. 2 pills mouth twice a day till 06/12/2018 then switch to 1 pill twice a day from 06/13/2018 (Patient taking differently: Take 10 mg by mouth 2 (two) times daily. ) 60 tablet 0 09/07/2018 at 800  . cetirizine (ZYRTEC) 10 MG tablet Take 10 mg by mouth daily.   09/07/2018 at Unknown time  . FLUoxetine (PROZAC) 40 MG capsule Take 1 capsule (40 mg total) by mouth daily. For depression and anxiety. 30 capsule 0 09/07/2018 at Unknown time  . Fluticasone-Umeclidin-Vilant (TRELEGY ELLIPTA) 100-62.5-25 MCG/INH AEPB Inhale 1 puff into the lungs daily.   09/06/2018 at Unknown time  . guaiFENesin (MUCINEX) 600 MG 12 hr tablet Take 800 mg by mouth 2 (two) times daily.    09/07/2018 at Unknown time  . Melatonin 5 MG CAPS Take 20 mg by mouth at bedtime.    09/06/2018 at Unknown time  . pantoprazole (PROTONIX) 40 MG tablet Take 1 tablet (40 mg total) by mouth 2 (two) times daily. 60 tablet 0 09/07/2018 at Unknown time  . Probiotic Product (PROBIOTIC PO) Take 1 tablet by mouth daily. Women's probiotic for GI health   09/07/2018 at Unknown time  . lactose free nutrition (BOOST PLUS) LIQD Take 237 mLs by mouth 3 (three) times daily with meals. (Patient not taking: Reported on 07/27/2018) 30 Can 0 Not Taking at Unknown time  . metoprolol tartrate  (LOPRESSOR) 25 MG tablet Take 1 tablet (25 mg total) by mouth 2 (two) times daily. (Patient not taking: Reported on 07/27/2018) 60 tablet 0 Not Taking at Unknown time  . ziprasidone (GEODON) 80 MG capsule Take one capsule at bedtime for depression and mood stability. (Patient not taking: Reported on 07/27/2018) 30 capsule 0 Not Taking at Unknown time   Scheduled: . apixaban  5 mg Oral BID  . feeding supplement (ENSURE ENLIVE)  237 mL Oral BID BM  . FLUoxetine  40 mg Oral Daily  . fluticasone furoate-vilanterol  1 puff Inhalation Daily   And  . umeclidinium bromide  1 puff Inhalation Daily  . guaiFENesin  1,200 mg Oral BID  . ipratropium-albuterol  3 mL Nebulization Q6H  . methylPREDNISolone (SOLU-MEDROL) injection  60 mg Intravenous Q6H  . pantoprazole  40 mg Oral BID  . pneumococcal 23 valent vaccine  0.5 mL Intramuscular Tomorrow-1000  . ziprasidone  80 mg Oral QHS   Continuous: . ceFEPime (MAXIPIME) IV 2 g (09/09/18 0557)   YNW:GNFAOZHYQ, guaiFENesin-dextromethorphan, HYDROcodone-homatropine, hydrOXYzine, oxyCODONE  Assesment: She was admitted with acute on chronic hypoxic respiratory failure which appears to be related to healthcare associated pneumonia, COPD exacerbation, and pulmonary fibrosis.  She is improving.  She has paranoid schizophrenia and had some hallucinations last night Principal Problem:   Acute on chronic respiratory failure with hypoxia (HCC) Active Problems:   Narcotic abuse in remission ([place])   Bipolar disorder (HCC)   Chronic pain   ADD (attention deficit disorder)   Schizophrenia, paranoid type ([place])   Thrombocytosis (HCC)   Hypokalemia   HCAP (healthcare-associated pneumonia)   History of DVT (deep vein thrombosis)   Normocytic anemia   GERD (gastroesophageal reflux disease)   Hyperglycemia   COPD with acute exacerbation (HCC)   Pulmonary fibrosis ([place])    Plan: Continue current treatments.  She does seem to be improving fairly markedly this  morning    LOS: 2 days   Percell Miller  L Agustina Witzke 09/09/2018, 8:38 AM

## 2018-09-09 NOTE — Progress Notes (Signed)
PROGRESS NOTE    Christine Ortega  KXF:[id number] DOB: 01-Jan-1961 DOA: 09/07/2018 PCP: Lucia Gaskins, MD   Brief Narrative:  HPI per Dr. Loma Boston on 09/07/2018 HPI: Christine Ortega is a 58 y.o. female with a history of seizure disorder, schizophrenia, depression, chronic pain, pulmonary fibrosis, history of DVT on eliquis.  The patient has been admitted twice since January for community-acquired pneumonia respiratory failure.  She feels that she breathes much better when on steroids and her breathing gets worse off steroids.  This last episode started approximately 2 weeks ago with a gradual decline.  She is now dyspneic to just a few steps.  She does have oxygen at home, which she needed to turn up as well.  She has been using her inhalers that she has been prescribed.  White sputum production with no other palliating or provoking factors.  Emergency Department Course: Patient started on IV steroids, healthcare associated pneumonia antibiotics.  Blood cultures obtained.  **Interim History Continues to not feel well and becomes dyspneic and severely coughing.  Has several coughing fits and is difficult to gather her breath.  Pulmonary was consulted for further evaluation recommendations and they are agreeing with current management.  Assessment & Plan:   Principal Problem:   Acute on chronic respiratory failure with hypoxia (HCC) Active Problems:   Narcotic abuse in remission (HCC)   Bipolar disorder (HCC)   Chronic pain   ADD (attention deficit disorder)   Schizophrenia, paranoid type (HCC)   Thrombocytosis (HCC)   Hypokalemia   HCAP (healthcare-associated pneumonia)   History of DVT (deep vein thrombosis)   Normocytic anemia   GERD (gastroesophageal reflux disease)   Hyperglycemia   COPD with acute exacerbation (HCC)   Pulmonary fibrosis (HCC)  Acute on Chronic Respiratory Failure with hypoxia in the setting of HCAP, COPD, and Pulmonary Fibrosis -Admitted to Inpatient  Telemetry -CXR on Admission showed cardiomegaly and asymmetric increased opacity in the right mid and lower lung zones which could represent infection or edema as well as worsening aeration.  There is also chronic interstitial lung disease -CXR this AM showed "Mild interval improvement diffuse bilateral airspace opacities. Findings may be secondary to infection or edema chronic background of fibrotic changes" -Continue supplemental oxygen via nasal cannula and wean O2 as tolerated -Continuous pulse oximetry and maintain O2 saturations greater than 92% -C/w IV Solu-Medrol 60 mg every 6h weaned to q12h given patient's Psychosis.  She received a dose of IV Solu-Medrol 25 mg in the ED -Continue with Duoneb 3 mL's every 6 scheduled along albuterol 2.5 mg nebs every 2 PRN for shortness of breath; cContinue with Breo Ellipta 1 puff ventilation daily along with Incruse Ellipta 1 puff daily -We will add guaifenesin 1200 mg p.o. twice daily along with Hycodan 5 mL's p.o. every 6 as needed for cough; benzonatate 200 mg was given once in the ED yesterday -ProCalcitonin level went from 10.58 and  is trending down and now 1.93 -BNP on admission was 69.0 -WBC went from 13.1 is now 9.1 -Checked Respiratory Virus Panel and was Negative  -SARS-CoV-2 virus was negative -We will need home ambulatory screen prior to discharge -Continue with PPI twice daily given steroids to prevent a gastritis and Gastric Ulcer -Given one-time dose of IV Lasix 40 mg -Continue to monitor respiratory status carefully and repeat chest x-ray in the a.m. -Pulmonary Consulted and Following; Appreciate further evaluation and recommendations and Pulmonary recommending continuing current treatments   Healthcare Associated Pneumonia -Treatment as above -Started  on antibiotic coverage with IV vancomycin IV cefepime for now and will de-escalate and stop IV vancomycin given MRSA negative PCR -Blood cultures x2 showed no growth to date less than  12 hours -Sputum culture never obtained will obtain now -Strep Urinary and Legionella Urine Ag Pending  -Checked for Respiratory Virus Panel and was negative -WBC was trending down but worsened in the setting of IV Steroid Demargination -Procalcitonin is trending down  -Continue monitor repeat CBC in the a.m.  History of DVT -C/w Apixaban 5 mg po BID  Pulmonary Fibrosis -Continue with Duoneb 3 mL's every 6 scheduled along albuterol 2.5 mg nebs every 2 PRN for shortness of breath; cContinue with Breo Ellipta 1 puff ventilation daily along with Incruse Ellipta 1 puff daily -We will add guaifenesin 1200 mg p.o. twice daily along with Hycodan 5 mL's p.o. every 6 as needed for cough; benzonatate 200 mg was given once in the ED yesterday -Pulmonary evaluating and recommending continuing current treatments  Acute on Chronic COPD Exacerbation -Treatment as Above -C/w IV Steroids but change to q12h instead of q6h, Nebs, and Abx; C/w Supplemental O2 via Paden  -Will need Ambulatory Home O2 Screen prior to D/C -C/w Breo-Ellipta and Incruse Ellipta  Bipolar Disorder/Anxeity/Depression/ADD/ Paranoid Schizophrenia with Psychosis and Behavioral Disturbances -C/w Fluoxetine 40 mg po Daily  -Per MAR no longer taking Home Ziprazidone 80 mg po qHS however resumed at 40 mg BID given her significant hallucinations and Agitation with Behavioral Disturbances; Patient is talking to the trash can and casting out demons and speaking with people that are not there screaming -1:1 Sitter initiated  -Will need Behavioral Health Evaluation -Re-orient   History of Narcotic Abuse and Chronic Pain -Given IV Morphine 4 mg x2 in the ED -Judicious use of narcotics given history of narcotic abuse and currently she is placed on Oxycodone 5 mg p.o. every 6 PRN for severe pain and increased to 1-2 tabs;  -Add Lidocaine Patch and Voltaren gel  -AVOID IV NARCOTICS if POSSIBLE -Continue with Hydrocodone-Homatroprine for  Cough  GERD -C/w PPI BID   Normocytic Anemia -Patient's hemoglobin/hematocrit went from 11.6/36.5 is now 11.2/35.2 -Check Anemia panel in the a.m. -Continue monitor for signs and symptoms of bleeding; currently no evidence of any overt bleeding -Continue monitor and trend -Repeat CBC in the a.m.  Thrombocytosis -Likely reactive and is trending down -Platelet count is trending downward from 535 -> 505 -> 592 -Continue monitor and trend -Repeat CBC in a.m.  Hypokalemia -Patient's potassium admission was 3.0 and this AM was 3.3 -Replete with po KCl 40 mEQ BID  -Mag Level was 1.9 -Continue monitor and trend -Continue monitor and trend  Hyperglycemia -Patient's blood sugar on CMP on admission was 93 is now 159 on BMP this morning-likely in the setting of IV steroids demargination-continue monitor and trend carefully -Blood sugars remain consistently elevated will place on considerable sliding scale insulin AC -Check hemoglobin A1c to rule out Diabetes  Leukocytosis -Likely worsened in the setting of IV steroid demargination as patient's pneumonia is improving procalcitonin is trending down -Patient is currently afebrile -Patient's WBC went from 9.1 is now 25.9 -IV steroids have been tapered and she is now on Solu-Medrol 60 mg every 12 -Continue monitor for signs and symptoms of infection and she is currently being treated with IV cefepime -Repeat CBC in the a.m.  DVT prophylaxis: Anticoagulated with Apixaban Code Status: FULL CODE  Family Communication: No family present at bedside  Disposition Plan: Remain Inpatient for continued workup  and Treatment  Consultants:   Pulmonary Dr. Luan Pulling  Will need TTS Consult to Psych   Procedures:  None   Antimicrobials:  Anti-infectives (From admission, onward)   Start     Dose/Rate Route Frequency Ordered Stop   09/08/18 1400  ceFEPIme (MAXIPIME) 2 g in sodium chloride 0.9 % 100 mL IVPB     2 g 200 mL/hr over 30 Minutes  Intravenous Every 8 hours 09/08/18 0849 09/15/18 2159   09/08/18 0600  vancomycin (VANCOCIN) 500 mg in sodium chloride 0.9 % 100 mL IVPB  Status:  Discontinued     500 mg 100 mL/hr over 60 Minutes Intravenous Every 12 hours 09/07/18 2216 09/08/18 1302   09/07/18 2200  ceFEPIme (MAXIPIME) 1 g in sodium chloride 0.9 % 100 mL IVPB  Status:  Discontinued     1 g 200 mL/hr over 30 Minutes Intravenous Every 8 hours 09/07/18 2028 09/08/18 0849   09/07/18 1745  vancomycin (VANCOCIN) IVPB 1000 mg/200 mL premix     1,000 mg 200 mL/hr over 60 Minutes Intravenous  Once 09/07/18 1732 09/07/18 1941   09/07/18 1745  piperacillin-tazobactam (ZOSYN) IVPB 3.375 g     3.375 g 12.5 mL/hr over 240 Minutes Intravenous  Once 09/07/18 1732 09/07/18 2229     Subjective: Seen and examined at bedside and she was actively psychotic and hallucinating and screaming at the top of her lungs.  She is also try to cast out demons and became calmer when I went in the room but then started screaming at the garbage can and said "that they were telling her what to do".  Still complaining of whole body pain especially from coughing and has a significant cough but per nursing only coughs when providers entering the room.  No chest pain at this time but does complain of some back pain and denies any swelling legs.  No other concerns or complaints at this time.  Objective: Vitals:   09/08/18 1942 09/08/18 2109 09/09/18 0541 09/09/18 0812  BP:  (!) 134/92 (!) 141/94   Pulse:  (!) 125 (!) 103   Resp:  20 18   Temp:  98.2 F (36.8 C) 98.1 F (36.7 C)   TempSrc:  Oral Oral   SpO2: 95% 98% 100% 98%  Weight:      Height:        Intake/Output Summary (Last 24 hours) at 09/09/2018 1314 Last data filed at 09/09/2018 0900 Gross per 24 hour  Intake 1019.89 ml  Output 1700 ml  Net -680.11 ml   Filed Weights   09/07/18 1432  Weight: 54.4 kg   Examination: Physical Exam:  Constitutional: Thin Caucasian female who appears older  than her stated age who is very anxious and actively hallucinating Eyes: Lids and conjunctive are normal. ENMT: External ears and nose appear normal.  Mucous members are moist Neck: Appears supple no JVD Respiratory: Diminished auscultation bilaterally with coarse breath sounds and some rhonchi and improved wheezing.  She began coughing again during examination.  Had a slightly increased respiratory rate and is not wearing any supplemental oxygen via nasal cannula when I walked in. Cardiovascular: Tachycardic rate but regular rhythm has trace to 1+ lower extremity edema Abdomen: Soft, not as tender to palpate.  Nondistended bowel sounds present GU: Deferred Musculoskeletal: No contractures or cyanosis.  No joint deformities in upper lower Skin: Skin is warm dry no appreciable rashes or lesions on limited skin evaluation Neurologic: Cranial nerves II through XII gross intact no visual focal  deficits Psychiatric: Impaired judgment and insight.  She is awake and alert.  Very anxious and acting psychotic by actively hallucinating  Data Reviewed: I have personally reviewed following labs and imaging studies  CBC: Recent Labs  Lab 09/07/18 1556 09/08/18 0555 09/09/18 0712  WBC 13.1* 9.1 25.9*  NEUTROABS 6.6  --  21.1*  HGB 11.6* 11.3* 11.2*  HCT 36.5 35.3* 35.2*  MCV 98.1 96.7 95.4  PLT 535* 505* 696*   Basic Metabolic Panel: Recent Labs  Lab 09/07/18 1556 09/08/18 0555 09/09/18 0712  NA 142 137 142  K 3.0* 3.5 3.3*  CL 106 100 99  CO2 26 24 28   GLUCOSE 93 159* 137*  BUN 7 6 11   CREATININE 0.52 0.53 0.56  CALCIUM 8.8* 8.8* 8.9  MG  --   --  1.9  PHOS  --   --  3.3   GFR: Estimated Creatinine Clearance: 64.2 mL/min (by C-G formula based on SCr of 0.56 mg/dL). Liver Function Tests: Recent Labs  Lab 09/07/18 1556 09/09/18 0712  AST 21 18  ALT 10 11  ALKPHOS 123 104  BILITOT 0.4 0.3  PROT 6.6 6.6  ALBUMIN 2.8* 2.9*   No results for input(s): LIPASE, AMYLASE in the  last 168 hours. No results for input(s): AMMONIA in the last 168 hours. Coagulation Profile: No results for input(s): INR, PROTIME in the last 168 hours. Cardiac Enzymes: Recent Labs  Lab 09/07/18 1556  TROPONINI <0.03   BNP (last 3 results) No results for input(s): PROBNP in the last 8760 hours. HbA1C: No results for input(s): HGBA1C in the last 72 hours. CBG: No results for input(s): GLUCAP in the last 168 hours. Lipid Profile: No results for input(s): CHOL, HDL, LDLCALC, TRIG, CHOLHDL, LDLDIRECT in the last 72 hours. Thyroid Function Tests: No results for input(s): TSH, T4TOTAL, FREET4, T3FREE, THYROIDAB in the last 72 hours. Anemia Panel: No results for input(s): VITAMINB12, FOLATE, FERRITIN, TIBC, IRON, RETICCTPCT in the last 72 hours. Sepsis Labs: Recent Labs  Lab 09/07/18 1556 09/08/18 0555 09/09/18 0712  PROCALCITON 10.58 5.15 1.93    Recent Results (from the past 240 hour(s))  SARS Coronavirus 2 (CEPHEID- Performed in [facility] hospital lab), Hosp Order     Status: None   Collection Time: 09/07/18  3:56 PM  Result Value Ref Range Status   SARS Coronavirus 2 NEGATIVE NEGATIVE Final    Comment: (NOTE) If result is NEGATIVE SARS-CoV-2 target nucleic acids are NOT DETECTED. The SARS-CoV-2 RNA is generally detectable in upper and lower  respiratory specimens during the acute phase of infection. The lowest  concentration of SARS-CoV-2 viral copies this assay can detect is 250  copies / mL. A negative result does not preclude SARS-CoV-2 infection  and should not be used as the sole basis for treatment or other  patient management decisions.  A negative result may occur with  improper specimen collection / handling, submission of specimen other  than nasopharyngeal swab, presence of viral mutation(s) within the  areas targeted by this assay, and inadequate number of viral copies  (<250 copies / mL). A negative result must be combined with clinical  observations,  patient history, and epidemiological information. If result is POSITIVE SARS-CoV-2 target nucleic acids are DETECTED. The SARS-CoV-2 RNA is generally detectable in upper and lower  respiratory specimens dur ing the acute phase of infection.  Positive  results are indicative of active infection with SARS-CoV-2.  Clinical  correlation with patient history and other diagnostic information is  necessary to determine patient infection status.  Positive results do  not rule out bacterial infection or co-infection with other viruses. If result is PRESUMPTIVE POSTIVE SARS-CoV-2 nucleic acids MAY BE PRESENT.   A presumptive positive result was obtained on the submitted specimen  and confirmed on repeat testing.  While 2019 novel coronavirus  (SARS-CoV-2) nucleic acids may be present in the submitted sample  additional confirmatory testing may be necessary for epidemiological  and / or clinical management purposes  to differentiate between  SARS-CoV-2 and other Sarbecovirus currently known to infect humans.  If clinically indicated additional testing with an alternate test  methodology [phone]) is advised. The SARS-CoV-2 RNA is generally  detectable in upper and lower respiratory sp ecimens during the acute  phase of infection. The expected result is Negative. Fact Sheet for Patients:  StrictlyIdeas.no Fact Sheet for Healthcare Providers: BankingDealers.co.za This test is not yet approved or cleared by the [place] FDA and has been authorized for detection and/or diagnosis of SARS-CoV-2 by FDA under an Emergency Use Authorization (EUA).  This EUA will remain in effect (meaning this test can be used) for the duration of the COVID-19 declaration under Section 564(b)(1) of the Act, 21 U.S.C. section 360bbb-3(b)(1), unless the authorization is terminated or revoked sooner. Performed at [facility], [address] [place], [place] [address]    MRSA PCR Screening     Status: None   Collection Time: 09/07/18  6:53 PM  Result Value Ref Range Status   MRSA by PCR NEGATIVE NEGATIVE Final    Comment:        The GeneXpert MRSA Assay (FDA approved for NASAL specimens only), is one component of a comprehensive MRSA colonization surveillance program. It is not intended to diagnose MRSA infection nor to guide or monitor treatment for MRSA infections. Performed at [facility], [address] [place], [place] [address]   Culture, blood (routine x 2) Call MD if unable to obtain prior to antibiotics being given     Status: None (Preliminary result)   Collection Time: 09/07/18  9:22 PM  Result Value Ref Range Status   Specimen Description BLOOD LEFT HAND  Final   Special Requests   Final    BOTTLES DRAWN AEROBIC AND ANAEROBIC Blood Culture adequate volume   Culture   Final    NO GROWTH 2 DAYS Performed at [facility], [address], [place], [place] [address]    Report Status PENDING  Incomplete  Culture, blood (routine x 2) Call MD if unable to obtain prior to antibiotics being given     Status: None (Preliminary result)   Collection Time: 09/07/18  9:34 PM  Result Value Ref Range Status   Specimen Description BLOOD RIGHT ARM  Final   Special Requests   Final    BOTTLES DRAWN AEROBIC AND ANAEROBIC Blood Culture adequate volume   Culture   Final    NO GROWTH 2 DAYS Performed at [facility], [address] [place], [place] [address]    Report Status PENDING  Incomplete  Respiratory Panel by PCR     Status: None   Collection Time: 09/08/18  1:00 PM  Result Value Ref Range Status   Adenovirus NOT DETECTED NOT DETECTED Final   Coronavirus 229E NOT DETECTED NOT DETECTED Final    Comment: (NOTE) The Coronavirus on the Respiratory Panel, DOES NOT test for the novel  Coronavirus (2019 nCoV)    Coronavirus HKU1 NOT DETECTED NOT DETECTED Final   Coronavirus NL63 NOT DETECTED  NOT DETECTED Final   Coronavirus OC43 NOT DETECTED  NOT DETECTED Final   Metapneumovirus NOT DETECTED NOT DETECTED Final   Rhinovirus / Enterovirus NOT DETECTED NOT DETECTED Final   Influenza A NOT DETECTED NOT DETECTED Final   Influenza B NOT DETECTED NOT DETECTED Final   Parainfluenza Virus 1 NOT DETECTED NOT DETECTED Final   Parainfluenza Virus 2 NOT DETECTED NOT DETECTED Final   Parainfluenza Virus 3 NOT DETECTED NOT DETECTED Final   Parainfluenza Virus 4 NOT DETECTED NOT DETECTED Final   Respiratory Syncytial Virus NOT DETECTED NOT DETECTED Final   Bordetella pertussis NOT DETECTED NOT DETECTED Final   Chlamydophila pneumoniae NOT DETECTED NOT DETECTED Final   Mycoplasma pneumoniae NOT DETECTED NOT DETECTED Final    Comment: Performed at [facility], [place] [address] [place], [place] [address]    Radiology Studies: Dg Chest Port 1 View  Result Date: 09/09/2018 CLINICAL DATA:  Shortness of breath EXAM: PORTABLE CHEST 1 VIEW COMPARISON:  Chest radiograph 08/08/2018 FINDINGS: Monitoring leads overlie the patient. Stable cardiomegaly. Slight improved aeration mild improvement in diffuse bilateral airspace opacities. No pleural effusion or pneumothorax. IMPRESSION: Mild interval improvement diffuse bilateral airspace opacities. Findings may be secondary to infection or edema chronic background of fibrotic changes. Electronically Signed   By: Lovey Newcomer M.D.   On: 09/09/2018 09:11   Dg Chest Port 1 View  Result Date: 09/08/2018 CLINICAL DATA:  Pneumonia twice this year with current difficulty breathing with cough. EXAM: PORTABLE CHEST 1 VIEW COMPARISON:  09/07/2018, 05/19/2018 and chest CT 05/23/2018 FINDINGS: Lungs are somewhat hypoinflated demonstrate a background of diffuse interstitial disease compatible with known fibrosis. There is mild persistent hazy bilateral central lung opacification which may be due to superimposed edema versus infection. No effusion. Cardiomediastinal silhouette and remainder of the exam is unchanged.  IMPRESSION: Persistent hazy central lung opacification which may be due to mild interstitial edema versus acute infection on background of chronic fibrosis. Electronically Signed   By: Marin Olp M.D.   On: 09/08/2018 13:28   Dg Chest Port 1 View  Result Date: 09/07/2018 CLINICAL DATA:  Cough with shortness of breath. EXAM: PORTABLE CHEST 1 VIEW COMPARISON:  08/01/2018. FINDINGS: The heart is enlarged. On a background of chronic interstitial lung disease, there is asymmetric increased opacity in the RIGHT mid and lower lung zones which could represent infection or edema. No effusion or pneumothorax. Bones unremarkable. IMPRESSION: Cardiomegaly. Asymmetric increased opacity in the RIGHT mid and lower lung zones which could represent infection or edema. Worsening aeration. Chronic interstitial lung disease. Correlate clinically for pulmonary edema versus early RIGHT lung infiltrate. Electronically Signed   By: Staci Righter M.D.   On: 09/07/2018 17:14   Scheduled Meds:  apixaban  5 mg Oral BID   benzonatate  100 mg Oral TID   diclofenac sodium  2 g Topical QID   feeding supplement (ENSURE ENLIVE)  237 mL Oral BID BM   FLUoxetine  40 mg Oral Daily   fluticasone furoate-vilanterol  1 puff Inhalation Daily   And   umeclidinium bromide  1 puff Inhalation Daily   guaiFENesin  1,200 mg Oral BID   ipratropium-albuterol  3 mL Nebulization Q6H   lidocaine  1 patch Transdermal Q24H   methylPREDNISolone (SOLU-MEDROL) injection  60 mg Intravenous Q12H   pantoprazole  40 mg Oral BID   pneumococcal 23 valent vaccine  0.5 mL Intramuscular Tomorrow-1000   ziprasidone  40 mg Oral BID WC   Continuous Infusions:  ceFEPime (MAXIPIME) IV 2 g (09/09/18 0557)    LOS: 2 days    Kerney Elbe, DO [facility] PAGER is on [place]  If 7PM-7AM, please contact night-coverage [email or web address] Password [email or web address] 09/09/2018, 1:14 PM
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
## 2018-09-09 NOTE — [facility] Counselor (Signed)
Called to confirm whether Pt is medically cleared.

## 2018-09-09 NOTE — Progress Notes (Signed)
Patient is asleep with out problems

## 2018-09-10 ENCOUNTER — Inpatient Hospital Stay (HOSPITAL_COMMUNITY): Payer: Medicare Other

## 2018-09-10 DIAGNOSIS — D72829 Elevated white blood cell count, unspecified: Secondary | ICD-10-CM

## 2018-09-10 LAB — COMPREHENSIVE METABOLIC PANEL
ALT: 16 U/L (ref 0–44)
AST: 34 U/L (ref 15–41)
Albumin: 3.1 g/dL — ABNORMAL LOW (ref 3.5–5.0)
Alkaline Phosphatase: 101 U/L (ref 38–126)
Anion gap: 13 (ref 5–15)
BUN: 11 mg/dL (ref 6–20)
CO2: 26 mmol/L (ref 22–32)
Calcium: 9.2 mg/dL (ref 8.9–10.3)
Chloride: 100 mmol/L (ref 98–111)
Creatinine, Ser: 0.6 mg/dL (ref 0.44–1.00)
GFR calc Af Amer: >60 mL/min (ref >60)
GFR calc non Af Amer: >60 mL/min (ref >60)
Glucose, Bld: 147 mg/dL — ABNORMAL HIGH (ref 70–99)
Potassium: 2.7 mmol/L — CL (ref 3.5–5.1)
Sodium: 139 mmol/L (ref 135–145)
Total Bilirubin: 0.4 mg/dL (ref 0.3–1.2)
Total Protein: 6.8 g/dL (ref 6.5–8.1)

## 2018-09-10 LAB — HEMOGLOBIN A1C
Hgb A1c MFr Bld: 5.8 % — ABNORMAL HIGH (ref 4.8–5.6)
Mean Plasma Glucose: 119.76 mg/dL

## 2018-09-10 LAB — RETICULOCYTES
Immature Retic Fract: 14.2 % (ref 2.3–15.9)
RBC.: 3.61 MIL/uL — ABNORMAL LOW (ref 3.87–5.11)
Retic Count, Absolute: 59.6 10*3/uL (ref 19.0–186.0)
Retic Ct Pct: 1.7 % (ref 0.4–3.1)

## 2018-09-10 LAB — CBC WITH DIFFERENTIAL/PLATELET
Abs Immature Granulocytes: 0.28 10*3/uL — ABNORMAL HIGH (ref 0.00–0.07)
Basophils Absolute: 0 10*3/uL (ref 0.0–0.1)
Basophils Relative: 0 %
Eosinophils Absolute: 0 10*3/uL (ref 0.0–0.5)
Eosinophils Relative: 0 %
HCT: 34.9 % — ABNORMAL LOW (ref 36.0–46.0)
Hemoglobin: 11.1 g/dL — ABNORMAL LOW (ref 12.0–15.0)
Immature Granulocytes: 1 %
Lymphocytes Relative: 5 %
Lymphs Abs: 1.2 10*3/uL (ref 0.7–4.0)
MCH: 30.7 pg (ref 26.0–34.0)
MCHC: 31.8 g/dL (ref 30.0–36.0)
MCV: 96.7 fL (ref 80.0–100.0)
Monocytes Absolute: 1.4 10*3/uL — ABNORMAL HIGH (ref 0.1–1.0)
Monocytes Relative: 6 %
Neutro Abs: 21.9 10*3/uL — ABNORMAL HIGH (ref 1.7–7.7)
Neutrophils Relative %: 88 %
Platelets: 554 10*3/uL — ABNORMAL HIGH (ref 150–400)
RBC: 3.61 MIL/uL — ABNORMAL LOW (ref 3.87–5.11)
RDW: 14.4 % (ref 11.5–15.5)
WBC: 24.9 10*3/uL — ABNORMAL HIGH (ref 4.0–10.5)
nRBC: 0 % (ref 0.0–0.2)

## 2018-09-10 LAB — PHOSPHORUS: Phosphorus: 2.7 mg/dL (ref 2.5–4.6)

## 2018-09-10 LAB — IRON AND TIBC
Iron: 27 ug/dL — ABNORMAL LOW (ref 28–170)
Saturation Ratios: 9 % — ABNORMAL LOW (ref 10.4–31.8)
TIBC: 316 ug/dL (ref 250–450)
UIBC: 289 ug/dL

## 2018-09-10 LAB — FERRITIN: Ferritin: 8 ng/mL — ABNORMAL LOW (ref 11–307)

## 2018-09-10 LAB — MAGNESIUM: Magnesium: 2 mg/dL (ref 1.7–2.4)

## 2018-09-10 LAB — FOLATE: Folate: 11.2 ng/mL (ref >5.9)

## 2018-09-10 LAB — VITAMIN B12: Vitamin B-12: 1015 pg/mL — ABNORMAL HIGH (ref 180–914)

## 2018-09-10 MED ORDER — POTASSIUM CHLORIDE CRYS ER 20 MEQ PO TBCR
40.0000 meq | EXTENDED_RELEASE_TABLET | Freq: Two times a day (BID) | ORAL | Status: DC
  Administered 2018-09-10 (×2): 40 meq via ORAL
  Filled 2018-09-10 (×2): qty 2

## 2018-09-10 MED ORDER — OXYCODONE HCL 5 MG PO TABS
10.0000 mg | ORAL_TABLET | Freq: Once | ORAL | Status: AC
  Administered 2018-09-10: 10 mg via ORAL
  Filled 2018-09-10: qty 2

## 2018-09-10 MED ORDER — POTASSIUM CHLORIDE 10 MEQ/100ML IV SOLN
10.0000 meq | INTRAVENOUS | Status: DC
  Filled 2018-09-10: qty 100

## 2018-09-10 MED ORDER — METHYLPREDNISOLONE SODIUM SUCC 40 MG IJ SOLR
40.0000 mg | Freq: Two times a day (BID) | INTRAMUSCULAR | Status: DC
  Administered 2018-09-10 – 2018-09-12 (×4): 40 mg via INTRAVENOUS
  Filled 2018-09-10 (×4): qty 1

## 2018-09-10 NOTE — Progress Notes (Signed)
Subjective: She says she feels a little bit better.  She has significant issues with hallucinations at night and she has no recollection of any of that.  She had some hypoxia early this morning.  Objective: Vital signs in last 24 hours: Temp:  [97.9 F (36.6 C)-98.5 F (36.9 C)] 98.5 F (36.9 C) (06/08 0503) Pulse Rate:  [84-115] 115 (06/08 0503) Resp:  [18] 18 (06/08 0503) BP: (111-137)/(61-91) 137/85 (06/08 0503) SpO2:  [88 %-100 %] 99 % (06/08 0503) Weight change:  Last BM Date: 09/08/18  Intake/Output from previous day: 06/07 0701 - 06/08 0700 In: 2100 [P.O.:1800; IV Piggyback:300] Out: 2750 [Urine:2750]  PHYSICAL EXAM General appearance: alert and no distress Resp: She has rhonchi and rales bilaterally that are basically unchanged Cardio: regular rate and rhythm, S1, S2 normal, no murmur, click, rub or gallop GI: soft, non-tender; bowel sounds normal; no masses,  no organomegaly Extremities: extremities normal, atraumatic, no cyanosis or edema  Lab Results:  Results for orders placed or performed during the hospital encounter of 09/07/18 (from the past 48 hour(s))  Respiratory Panel by PCR     Status: None   Collection Time: 09/08/18  1:00 PM  Result Value Ref Range   Adenovirus NOT DETECTED NOT DETECTED   Coronavirus 229E NOT DETECTED NOT DETECTED    Comment: (NOTE) The Coronavirus on the Respiratory Panel, DOES NOT test for the novel  Coronavirus (2019 nCoV)    Coronavirus HKU1 NOT DETECTED NOT DETECTED   Coronavirus NL63 NOT DETECTED NOT DETECTED   Coronavirus OC43 NOT DETECTED NOT DETECTED   Metapneumovirus NOT DETECTED NOT DETECTED   Rhinovirus / Enterovirus NOT DETECTED NOT DETECTED   Influenza A NOT DETECTED NOT DETECTED   Influenza B NOT DETECTED NOT DETECTED   Parainfluenza Virus 1 NOT DETECTED NOT DETECTED   Parainfluenza Virus 2 NOT DETECTED NOT DETECTED   Parainfluenza Virus 3 NOT DETECTED NOT DETECTED   Parainfluenza Virus 4 NOT DETECTED NOT  DETECTED   Respiratory Syncytial Virus NOT DETECTED NOT DETECTED   Bordetella pertussis NOT DETECTED NOT DETECTED   Chlamydophila pneumoniae NOT DETECTED NOT DETECTED   Mycoplasma pneumoniae NOT DETECTED NOT DETECTED    Comment: Performed at [facility], [address], [place], [place] [address]  Urine rapid drug screen (hosp performed)     Status: Abnormal   Collection Time: 09/08/18  5:50 PM  Result Value Ref Range   Opiates POSITIVE (A) NONE DETECTED   Cocaine NONE DETECTED NONE DETECTED   Benzodiazepines NONE DETECTED NONE DETECTED   Amphetamines NONE DETECTED NONE DETECTED   Tetrahydrocannabinol NONE DETECTED NONE DETECTED   Barbiturates NONE DETECTED NONE DETECTED    Comment: (NOTE) DRUG SCREEN FOR MEDICAL PURPOSES ONLY.  IF CONFIRMATION IS NEEDED FOR ANY PURPOSE, NOTIFY LAB WITHIN 5 DAYS. LOWEST DETECTABLE LIMITS FOR URINE DRUG SCREEN Drug Class                     Cutoff (ng/mL) Amphetamine and metabolites    1000 Barbiturate and metabolites    200 Benzodiazepine                 539 Tricyclics and metabolites     300 Opiates and metabolites        300 Cocaine and metabolites        300 THC  30 Performed at [facility], [address], [place], [place] [address]   Procalcitonin     Status: None   Collection Time: 09/09/18  7:12 AM  Result Value Ref Range   Procalcitonin 1.93 ng/mL    Comment:        Interpretation: PCT > 0.5 ng/mL and <= 2 ng/mL: Systemic infection (sepsis) is possible, but other conditions are known to elevate PCT as well. (NOTE)       Sepsis PCT Algorithm           Lower Respiratory Tract                                      Infection PCT Algorithm    ----------------------------     ----------------------------         PCT < 0.25 ng/mL                PCT < 0.10 ng/mL         Strongly encourage             Strongly discourage   discontinuation of antibiotics    initiation of antibiotics     ----------------------------     -----------------------------       PCT 0.25 - 0.50 ng/mL            PCT 0.10 - 0.25 ng/mL               OR       >80% decrease in PCT            Discourage initiation of                                            antibiotics      Encourage discontinuation           of antibiotics    ----------------------------     -----------------------------         PCT >= 0.50 ng/mL              PCT 0.26 - 0.50 ng/mL                AND       <80% decrease in PCT             Encourage initiation of                                             antibiotics       Encourage continuation           of antibiotics    ----------------------------     -----------------------------        PCT >= 0.50 ng/mL                  PCT > 0.50 ng/mL               AND         increase in PCT                  Strongly encourage  initiation of antibiotics    Strongly encourage escalation           of antibiotics                                     -----------------------------                                           PCT <= 0.25 ng/mL                                                 OR                                        > 80% decrease in PCT                                     Discontinue / Do not initiate                                             antibiotics Performed at [facility], [address] [place], [place] [address]   CBC with Differential/Platelet     Status: Abnormal   Collection Time: 09/09/18  7:12 AM  Result Value Ref Range   WBC 25.9 (H) 4.0 - 10.5 K/uL    Comment: WHITE COUNT CONFIRMED ON SMEAR   RBC 3.69 (L) 3.87 - 5.11 MIL/uL   Hemoglobin 11.2 (L) 12.0 - 15.0 g/dL   HCT 35.2 (L) 36.0 - 46.0 %   MCV 95.4 80.0 - 100.0 fL   MCH 30.4 26.0 - 34.0 pg   MCHC 31.8 30.0 - 36.0 g/dL   RDW 14.3 11.5 - 15.5 %   Platelets 592 (H) 150 - 400 K/uL   nRBC 0.0 0.0 - 0.2 %   Neutrophils Relative % 81 %   Neutro Abs 21.1 (H) 1.7 - 7.7  K/uL   Lymphocytes Relative 9 %   Lymphs Abs 2.3 0.7 - 4.0 K/uL   Monocytes Relative 8 %   Monocytes Absolute 2.0 (H) 0.1 - 1.0 K/uL   Eosinophils Relative 0 %   Eosinophils Absolute 0.0 0.0 - 0.5 K/uL   Basophils Relative 0 %   Basophils Absolute 0.0 0.0 - 0.1 K/uL   WBC Morphology VACUOLATED NEUTROPHILS    Immature Granulocytes 2 %   Abs Immature Granulocytes 0.46 (H) 0.00 - 0.07 K/uL    Comment: Performed at [facility], [address], [place], [place] [address]  Comprehensive metabolic panel     Status: Abnormal   Collection Time: 09/09/18  7:12 AM  Result Value Ref Range   Sodium 142 135 - 145 mmol/L   Potassium 3.3 (L) 3.5 - 5.1 mmol/L   Chloride 99 98 - 111 mmol/L   CO2 28 22 - 32 mmol/L   Glucose, Bld 137 (H) 70 - 99 mg/dL   BUN 11  6 - 20 mg/dL   Creatinine, Ser 0.56 0.44 - 1.00 mg/dL   Calcium 8.9 8.9 - 10.3 mg/dL   Total Protein 6.6 6.5 - 8.1 g/dL   Albumin 2.9 (L) 3.5 - 5.0 g/dL   AST 18 15 - 41 U/L   ALT 11 0 - 44 U/L   Alkaline Phosphatase 104 38 - 126 U/L   Total Bilirubin 0.3 0.3 - 1.2 mg/dL   GFR calc non Af Amer >60 >60 mL/min   GFR calc Af Amer >60 >60 mL/min   Anion gap 15 5 - 15    Comment: Performed at [facility], [address] [place], [place] [address]  Magnesium     Status: None   Collection Time: 09/09/18  7:12 AM  Result Value Ref Range   Magnesium 1.9 1.7 - 2.4 mg/dL    Comment: Performed at [facility], [address], [place], [place] [address]  Phosphorus     Status: None   Collection Time: 09/09/18  7:12 AM  Result Value Ref Range   Phosphorus 3.3 2.5 - 4.6 mg/dL    Comment: Performed at [facility], [address], [place], [place] [address]  CBC with Differential/Platelet     Status: Abnormal   Collection Time: 09/10/18  5:56 AM  Result Value Ref Range   WBC 24.9 (H) 4.0 - 10.5 K/uL   RBC 3.61 (L) 3.87 - 5.11 MIL/uL   Hemoglobin 11.1 (L) 12.0 - 15.0 g/dL   HCT 34.9 (L) 36.0 - 46.0 %   MCV 96.7 80.0 - 100.0 fL   MCH 30.7 26.0 -  34.0 pg   MCHC 31.8 30.0 - 36.0 g/dL   RDW 14.4 11.5 - 15.5 %   Platelets 554 (H) 150 - 400 K/uL   nRBC 0.0 0.0 - 0.2 %   Neutrophils Relative % 88 %   Neutro Abs 21.9 (H) 1.7 - 7.7 K/uL   Lymphocytes Relative 5 %   Lymphs Abs 1.2 0.7 - 4.0 K/uL   Monocytes Relative 6 %   Monocytes Absolute 1.4 (H) 0.1 - 1.0 K/uL   Eosinophils Relative 0 %   Eosinophils Absolute 0.0 0.0 - 0.5 K/uL   Basophils Relative 0 %   Basophils Absolute 0.0 0.0 - 0.1 K/uL   Immature Granulocytes 1 %   Abs Immature Granulocytes 0.28 (H) 0.00 - 0.07 K/uL    Comment: Performed at [facility], [address] [place], [place] [address]  Comprehensive metabolic panel     Status: Abnormal   Collection Time: 09/10/18  5:56 AM  Result Value Ref Range   Sodium 139 135 - 145 mmol/L   Potassium 2.7 (LL) 3.5 - 5.1 mmol/L    Comment: CRITICAL RESULT CALLED TO, READ BACK BY AND VERIFIED WITH: FOLEY,B AT 7:25AM ON 09/10/18 BY FESTERMAN,C    Chloride 100 98 - 111 mmol/L   CO2 26 22 - 32 mmol/L   Glucose, Bld 147 (H) 70 - 99 mg/dL   BUN 11 6 - 20 mg/dL   Creatinine, Ser 0.60 0.44 - 1.00 mg/dL   Calcium 9.2 8.9 - 10.3 mg/dL   Total Protein 6.8 6.5 - 8.1 g/dL   Albumin 3.1 (L) 3.5 - 5.0 g/dL   AST 34 15 - 41 U/L   ALT 16 0 - 44 U/L   Alkaline Phosphatase 101 38 - 126 U/L   Total Bilirubin 0.4 0.3 - 1.2 mg/dL   GFR calc non Af Amer >60 >60 mL/min   GFR  calc Af Amer >60 >60 mL/min   Anion gap 13 5 - 15    Comment: Performed at [facility], [address], [place], [place] [address]  Magnesium     Status: None   Collection Time: 09/10/18  5:56 AM  Result Value Ref Range   Magnesium 2.0 1.7 - 2.4 mg/dL    Comment: Performed at [facility], [address] [place], [place] [address]  Phosphorus     Status: None   Collection Time: 09/10/18  5:56 AM  Result Value Ref Range   Phosphorus 2.7 2.5 - 4.6 mg/dL    Comment: Performed at [facility], [address], [place], [place] [address]  Folate     Status: None    Collection Time: 09/10/18  5:56 AM  Result Value Ref Range   Folate 11.2 >5.9 ng/mL    Comment: Performed at [facility], [address] [place], [place] [address]  Reticulocytes     Status: Abnormal   Collection Time: 09/10/18  5:56 AM  Result Value Ref Range   Retic Ct Pct 1.7 0.4 - 3.1 %   RBC. 3.61 (L) 3.87 - 5.11 MIL/uL   Retic Count, Absolute 59.6 19.0 - 186.0 K/uL   Immature Retic Fract 14.2 2.3 - 15.9 %    Comment: Performed at [facility], [address], [place], [place] [address]    ABGS No results for input(s): PHART, PO2ART, TCO2, HCO3 in the last 72 hours.  Invalid input(s): PCO2 CULTURES Recent Results (from the past 240 hour(s))  SARS Coronavirus 2 (CEPHEID- Performed in [place] hospital lab), Hosp Order     Status: None   Collection Time: 09/07/18  3:56 PM  Result Value Ref Range Status   SARS Coronavirus 2 NEGATIVE NEGATIVE Final    Comment: (NOTE) If result is NEGATIVE SARS-CoV-2 target nucleic acids are NOT DETECTED. The SARS-CoV-2 RNA is generally detectable in upper and lower  respiratory specimens during the acute phase of infection. The lowest  concentration of SARS-CoV-2 viral copies this assay can detect is 250  copies / mL. A negative result does not preclude SARS-CoV-2 infection  and should not be used as the sole basis for treatment or other  patient management decisions.  A negative result may occur with  improper specimen collection / handling, submission of specimen other  than nasopharyngeal swab, presence of viral mutation(s) within the  areas targeted by this assay, and inadequate number of viral copies  (<250 copies / mL). A negative result must be combined with clinical  observations, patient history, and epidemiological information. If result is POSITIVE SARS-CoV-2 target nucleic acids are DETECTED. The SARS-CoV-2 RNA is generally detectable in upper and lower  respiratory specimens dur ing the acute phase of infection.  Positive   results are indicative of active infection with SARS-CoV-2.  Clinical  correlation with patient history and other diagnostic information is  necessary to determine patient infection status.  Positive results do  not rule out bacterial infection or co-infection with other viruses. If result is PRESUMPTIVE POSTIVE SARS-CoV-2 nucleic acids MAY BE PRESENT.   A presumptive positive result was obtained on the submitted specimen  and confirmed on repeat testing.  While 2019 novel coronavirus  (SARS-CoV-2) nucleic acids may be present in the submitted sample  additional confirmatory testing may be necessary for epidemiological  and / or clinical management purposes  to differentiate between  SARS-CoV-2 and other Sarbecovirus currently known to infect humans.  If clinically indicated additional testing  with an alternate test  methodology [phone]) is advised. The SARS-CoV-2 RNA is generally  detectable in upper and lower respiratory sp ecimens during the acute  phase of infection. The expected result is Negative. Fact Sheet for Patients:  StrictlyIdeas.no Fact Sheet for Healthcare Providers: BankingDealers.co.za This test is not yet approved or cleared by the [place] FDA and has been authorized for detection and/or diagnosis of SARS-CoV-2 by FDA under an Emergency Use Authorization (EUA).  This EUA will remain in effect (meaning this test can be used) for the duration of the COVID-19 declaration under Section 564(b)(1) of the Act, 21 U.S.C. section 360bbb-3(b)(1), unless the authorization is terminated or revoked sooner. Performed at [facility], [address], [place], [place] [address]   MRSA PCR Screening     Status: None   Collection Time: 09/07/18  6:53 PM  Result Value Ref Range Status   MRSA by PCR NEGATIVE NEGATIVE Final    Comment:        The GeneXpert MRSA Assay (FDA approved for NASAL specimens only), is one component of  a comprehensive MRSA colonization surveillance program. It is not intended to diagnose MRSA infection nor to guide or monitor treatment for MRSA infections. Performed at [facility], [address], [place], [place] [address]   Culture, blood (routine x 2) Call MD if unable to obtain prior to antibiotics being given     Status: None (Preliminary result)   Collection Time: 09/07/18  9:22 PM  Result Value Ref Range Status   Specimen Description BLOOD LEFT HAND  Final   Special Requests   Final    BOTTLES DRAWN AEROBIC AND ANAEROBIC Blood Culture adequate volume   Culture   Final    NO GROWTH 2 DAYS Performed at [facility], [address], [place], [place] [address]    Report Status PENDING  Incomplete  Culture, blood (routine x 2) Call MD if unable to obtain prior to antibiotics being given     Status: None (Preliminary result)   Collection Time: 09/07/18  9:34 PM  Result Value Ref Range Status   Specimen Description BLOOD RIGHT ARM  Final   Special Requests   Final    BOTTLES DRAWN AEROBIC AND ANAEROBIC Blood Culture adequate volume   Culture   Final    NO GROWTH 2 DAYS Performed at [facility], [address] [place], [place] [address]    Report Status PENDING  Incomplete  Respiratory Panel by PCR     Status: None   Collection Time: 09/08/18  1:00 PM  Result Value Ref Range Status   Adenovirus NOT DETECTED NOT DETECTED Final   Coronavirus 229E NOT DETECTED NOT DETECTED Final    Comment: (NOTE) The Coronavirus on the Respiratory Panel, DOES NOT test for the novel  Coronavirus (2019 nCoV)    Coronavirus HKU1 NOT DETECTED NOT DETECTED Final   Coronavirus NL63 NOT DETECTED NOT DETECTED Final   Coronavirus OC43 NOT DETECTED NOT DETECTED Final   Metapneumovirus NOT DETECTED NOT DETECTED Final   Rhinovirus / Enterovirus NOT DETECTED NOT DETECTED Final   Influenza A NOT DETECTED NOT DETECTED Final   Influenza B NOT DETECTED NOT DETECTED Final   Parainfluenza Virus 1 NOT  DETECTED NOT DETECTED Final   Parainfluenza Virus 2 NOT DETECTED NOT DETECTED Final   Parainfluenza Virus 3 NOT DETECTED NOT DETECTED Final   Parainfluenza Virus 4 NOT DETECTED NOT DETECTED Final   Respiratory Syncytial Virus NOT DETECTED NOT DETECTED Final   Bordetella  pertussis NOT DETECTED NOT DETECTED Final   Chlamydophila pneumoniae NOT DETECTED NOT DETECTED Final   Mycoplasma pneumoniae NOT DETECTED NOT DETECTED Final    Comment: Performed at [facility], [place] [address], [place], [place] [address]   Studies/Results: Dg Chest Port 1 View  Result Date: 09/09/2018 CLINICAL DATA:  Shortness of breath EXAM: PORTABLE CHEST 1 VIEW COMPARISON:  Chest radiograph 08/08/2018 FINDINGS: Monitoring leads overlie the patient. Stable cardiomegaly. Slight improved aeration mild improvement in diffuse bilateral airspace opacities. No pleural effusion or pneumothorax. IMPRESSION: Mild interval improvement diffuse bilateral airspace opacities. Findings may be secondary to infection or edema chronic background of fibrotic changes. Electronically Signed   By: Lovey Newcomer M.D.   On: 09/09/2018 09:11   Dg Chest Port 1 View  Result Date: 09/08/2018 CLINICAL DATA:  Pneumonia twice this year with current difficulty breathing with cough. EXAM: PORTABLE CHEST 1 VIEW COMPARISON:  09/07/2018, 05/19/2018 and chest CT 05/23/2018 FINDINGS: Lungs are somewhat hypoinflated demonstrate a background of diffuse interstitial disease compatible with known fibrosis. There is mild persistent hazy bilateral central lung opacification which may be due to superimposed edema versus infection. No effusion. Cardiomediastinal silhouette and remainder of the exam is unchanged. IMPRESSION: Persistent hazy central lung opacification which may be due to mild interstitial edema versus acute infection on background of chronic fibrosis. Electronically Signed   By: Marin Olp M.D.   On: 09/08/2018 13:28    Medications:  Prior to  Admission:  Medications Prior to Admission  Medication Sig Dispense Refill Last Dose  . acetaminophen (TYLENOL) 500 MG tablet Take 1 tablet (500 mg total) by mouth every 8 (eight) hours as needed for moderate pain. 30 tablet 0 Past Week at Unknown time  . apixaban (ELIQUIS) 5 MG TABS tablet Take 1 tablet (5 mg total) by mouth 2 (two) times daily. 2 pills mouth twice a day till 06/12/2018 then switch to 1 pill twice a day from 06/13/2018 (Patient taking differently: Take 10 mg by mouth 2 (two) times daily. ) 60 tablet 0 09/07/2018 at 800  . cetirizine (ZYRTEC) 10 MG tablet Take 10 mg by mouth daily.   09/07/2018 at Unknown time  . FLUoxetine (PROZAC) 40 MG capsule Take 1 capsule (40 mg total) by mouth daily. For depression and anxiety. 30 capsule 0 09/07/2018 at Unknown time  . Fluticasone-Umeclidin-Vilant (TRELEGY ELLIPTA) 100-62.5-25 MCG/INH AEPB Inhale 1 puff into the lungs daily.   09/06/2018 at Unknown time  . guaiFENesin (MUCINEX) 600 MG 12 hr tablet Take 800 mg by mouth 2 (two) times daily.    09/07/2018 at Unknown time  . Melatonin 5 MG CAPS Take 20 mg by mouth at bedtime.    09/06/2018 at Unknown time  . pantoprazole (PROTONIX) 40 MG tablet Take 1 tablet (40 mg total) by mouth 2 (two) times daily. 60 tablet 0 09/07/2018 at Unknown time  . Probiotic Product (PROBIOTIC PO) Take 1 tablet by mouth daily. Women's probiotic for GI health   09/07/2018 at Unknown time  . lactose free nutrition (BOOST PLUS) LIQD Take 237 mLs by mouth 3 (three) times daily with meals. (Patient not taking: Reported on 07/27/2018) 30 Can 0 Not Taking at Unknown time  . metoprolol tartrate (LOPRESSOR) 25 MG tablet Take 1 tablet (25 mg total) by mouth 2 (two) times daily. (Patient not taking: Reported on 07/27/2018) 60 tablet 0 Not Taking at Unknown time  . ziprasidone (GEODON) 80 MG capsule Take one capsule at bedtime for depression and mood stability. (  Patient not taking: Reported on 07/27/2018) 30 capsule 0 Not Taking at Unknown time    Scheduled: . apixaban  5 mg Oral BID  . benzonatate  100 mg Oral TID  . diclofenac sodium  2 g Topical QID  . feeding supplement (ENSURE ENLIVE)  237 mL Oral BID BM  . FLUoxetine  40 mg Oral Daily  . fluticasone furoate-vilanterol  1 puff Inhalation Daily   And  . umeclidinium bromide  1 puff Inhalation Daily  . guaiFENesin  1,200 mg Oral BID  . haloperidol lactate  5 mg Intravenous Once  . ipratropium-albuterol  3 mL Nebulization Q6H  . lidocaine  1 patch Transdermal Q24H  . methylPREDNISolone (SOLU-MEDROL) injection  60 mg Intravenous Q12H  . pantoprazole  40 mg Oral BID  . pneumococcal 23 valent vaccine  0.5 mL Intramuscular Tomorrow-1000  . potassium chloride  40 mEq Oral BID  . ziprasidone  40 mg Oral BID WC   Continuous: . ceFEPime (MAXIPIME) IV 2 g (09/10/18 0553)  . potassium chloride     JEH:UDJSHFWYOVZCH, albuterol, HYDROcodone-homatropine, hydrOXYzine, oxyCODONE  Assesment: She was admitted with acute on chronic hypoxic respiratory failure related to healthcare associated pneumonia.  She also has pulmonary fibrosis and COPD which is exacerbated.  She had hypoxia last night and continues to have significant cough.  Chest x-ray is pending.  She has had abnormal behavior and this may be worsened by her IV steroids which are being tapered appropriately.  She has bipolar disease/schizophrenia and has had hallucinations Principal Problem:   Acute on chronic respiratory failure with hypoxia (HCC) Active Problems:   Narcotic abuse in remission ([place])   Bipolar disorder (HCC)   Chronic pain   ADD (attention deficit disorder)   Schizophrenia, paranoid type ([place])   Thrombocytosis (HCC)   Hypokalemia   HCAP (healthcare-associated pneumonia)   History of DVT (deep vein thrombosis)   Normocytic anemia   GERD (gastroesophageal reflux disease)   Hyperglycemia   COPD with acute exacerbation (HCC)   Pulmonary fibrosis (HCC)    Plan: Continue tapering steroids.  Continue  antibiotics.  Continue nebulizer treatments.  She does look a little bit better    LOS: 3 days   Alonza Bogus 09/10/2018, 7:57 AM

## 2018-09-10 NOTE — Progress Notes (Signed)
Xcover Called by RN Pt having pain over the right mid back over her ribs.  Apparently coughing spell preceeded the pain developing.   Exam: T 97.9 P 84  R 18 Bp 111/61  Pox 88% on RA, 97% on o2 Heent: anicteric Neck: no jvd Heart: rrr s1, s2, no m/g/r Lung: prolonged exp phase, slight basilar crackles, slightly diminished bs at bases Pain with palpation over the right paravertbral area No rash Abd: soft, nt Ext: no c/c/e  A/P Rib pain CXR Oxycodone 10mg  po x1

## 2018-09-10 NOTE — Care Management Important Message (Signed)
Important Message  Patient Details  Name: BENICIA BERGEVIN MRN: [id number] Date of Birth: 10-31-1960   Medicare Important Message Given:  Yes    Tommy Medal 09/10/2018, 1:15 PM

## 2018-09-10 NOTE — Progress Notes (Signed)
Pt is refusing her IV and PO potassium until she speaks with the MD. Attending RN explained the importance of taking it, still refused until speaking with the MD. MD made aware, will continue to monitor.

## 2018-09-10 NOTE — TOC Initial Note (Addendum)
Transition of Care [facility]) - Initial/Assessment Note    Patient Details  Name: Christine Ortega MRN: [id number] Date of Birth: 1961-02-06  Transition of Care [facility]) CM/SW Contact:    Boneta Lucks, RN Phone Number: 09/10/2018, 10:49 AM  Clinical Narrative:        Patient has a high risk score for readmission. In report MD states patient has been having hallucinations and refusing medication today.  Patient needs a TTS consult for possible behavioral health bed. Patient has Home 02.  PT recommends Home Health PT,  OT consult is ordered as well. TOC will follow.            Expected Discharge Plan: [place] Barriers to Discharge: Continued Medical Work up, Other (comment)(Needs TTS)   Patient Goals and CMS Choice        Expected Discharge Plan and Services Expected Discharge Plan: [place] arrangements for the past 2 months: Single Family Home                                      Prior Living Arrangements/Services Living arrangements for the past 2 months: Single Family Home Lives with:: Self, Relatives          Need for Family Participation in Patient Care: Yes (Comment)   Current home services: DME, Other (comment)(HOme 02) Criminal Activity/Legal Involvement Pertinent to Current Situation/Hospitalization: No - Comment as needed  Activities of Daily Living Home Assistive Devices/Equipment: Walker (specify type), Oxygen, Nebulizer ADL Screening (condition at time of admission) Patient's cognitive ability adequate to safely complete daily activities?: Yes Is the patient deaf or have difficulty hearing?: No Does the patient have difficulty seeing, even when wearing glasses/contacts?: No Does the patient have difficulty concentrating, remembering, or making decisions?: No Patient able to express need for assistance with ADLs?: Yes Does the patient have difficulty dressing or bathing?: No Independently performs  ADLs?: Yes (appropriate for developmental age) Does the patient have difficulty walking or climbing stairs?: No Weakness of Legs: Both Weakness of Arms/Hands: None  Permission Sought/Granted Permission sought to share information with : Case Manager                Emotional Assessment Appearance:: Appears older than stated age            Admission diagnosis:  Cough [R05] SOB (shortness of breath) [R06.02] History of pulmonary fibrosis [Z87.09] HCAP (healthcare-associated pneumonia) [J18.9] Patient Active Problem List   Diagnosis Date Noted  . Normocytic anemia 09/08/2018  . GERD (gastroesophageal reflux disease) 09/08/2018  . Hyperglycemia 09/08/2018  . COPD with acute exacerbation ([place]) 09/08/2018  . Pulmonary fibrosis ([place]) 09/08/2018  . Acute on chronic respiratory failure with hypoxia ([place]) 09/07/2018  . History of DVT (deep vein thrombosis) 09/07/2018  . HCAP (healthcare-associated pneumonia) 08/01/2018  . Bowel perforation ([place])   . Acute blood loss anemia 05/19/2018  . Thrombocytosis ([place]) 05/19/2018  . Hypokalemia   . Schizophrenia, paranoid type ([place]) 08/08/2011  . Narcotic abuse in remission ([place])   . Bipolar disorder ([place])   . Chronic pain   . ADD (attention deficit disorder)    PCP:  Lucia Gaskins, MD Pharmacy:   [facility], [place] [address] [place] [address] Phone: [phone] Fax: [phone]  [facility]  [facility] [address], [place] [address] Phone: [phone] Fax: [phone]     Social Determinants of Health (SDOH) Interventions    Readmission Risk Interventions Readmission Risk Prevention Plan 09/10/2018 07/31/2018  Transportation Screening - Complete  PCP or Specialist Appt within 3-5 Days Not Complete Complete  HRI or Home Care Consult Not Complete Complete  Social Work Consult for [place]  Planning/Counseling Not Complete Complete  SW consult not completed comments Pt having Behavioral issues MD will order TTS -  Palliative Care Screening Not Applicable Not Applicable  Medication Review (RN Care Manager) Complete Complete  Some recent data might be hidden

## 2018-09-10 NOTE — Progress Notes (Signed)
Called to room around [place] for patient having low saturation  85 - 88. Patient complaint of pain in back. Patient hyperventilating, hollering, praying thrashing around in bed. Started patient on neb 5.0 mg albuterol mostly for Placebo effect. Patient saturations increased. Breath sounds decreased clear. Dr Maudie Mercury order chest x-ray checking for cracked rib as patient coughs extremely hard at times. Most of the time cough is on demand when others in room. Also patient to receive one time dose of pain Rx. Patient does have history of Mental problems along with her Lung problems.

## 2018-09-10 NOTE — Progress Notes (Signed)
Patient woke from sleep shaking, crying and screaming for help. Patient screaming "I lost him, I lost him." Patient stated that she was sedated and unable to lead people to the lord while she sleeps. Patient stated that she no longer wants to take Geodon because it makes her sedated. Patient stated "I feel so good when I am not taking my Geodon. I don't need to sleep like that." Charge nurse entered the room patient stated "Get the hell out of here you fucking whore." AC called to see patient. Patient complaining of pain to her ribs from coughing. PRN pain and cough meds given.  Mid Level notified. Refusing an anxiety medications. Patient called daughter, April Shiffer, to calm her down. Patient able to calm down and watch TV after talking to her daughter on the phone. Patient is resting in bed at this time. Call bell within reach. Will continue to monitor.

## 2018-09-10 NOTE — Evaluation (Signed)
Physical Therapy Evaluation Patient Details Name: Christine Ortega MRN: [id number] DOB: 11-29-1960 Today's Date: 09/10/2018   History of Present Illness  Christine Ortega is a 58 y.o. female with a history of seizure disorder, schizophrenia, depression, chronic pain, pulmonary fibrosis, history of DVT on eliquis.  The patient has been admitted twice since January for community-acquired pneumonia respiratory failure.  She feels that she breathes much better when on steroids and her breathing gets worse off steroids.  This last episode started approximately 2 weeks ago with a gradual decline.  She is now dyspneic to just a few steps.  She does have oxygen at home, which she needed to turn up as well.  She has been using her inhalers that she has been prescribed.  White sputum production with no other palliating or provoking factors.    Clinical Impression  Patient demonstrates slow labored cadence without loss of balance, limited mostly due to c/o bilateral ankle pain and SpO2 desaturated to 86% while on 3 LPM O2.  Patient requested and tolerated sitting up in chair after therapy.  Patient will benefit from continued physical therapy in hospital and recommended venue below to increase strength, balance, endurance for safe ADLs and gait.    Follow Up Recommendations Home health PT;Supervision for mobility/OOB;Supervision - Intermittent    Equipment Recommendations       Recommendations for Other Services       Precautions / Restrictions Precautions Precautions: Fall Precaution Comments: Home O2 dependent Restrictions Weight Bearing Restrictions: No      Mobility  Bed Mobility Overal bed mobility: Modified Independent             General bed mobility comments: increased time  Transfers Overall transfer level: Modified independent               General transfer comment: increased time  Ambulation/Gait Ambulation/Gait assistance: Min guard;Min assist Gait Distance (Feet):  22 Feet Assistive device: Rolling walker (2 wheeled) Gait Pattern/deviations: Decreased step length - right;Decreased step length - left;Decreased stride length Gait velocity: decreased   General Gait Details: labored slow cadence limited mostly due to c/o bilateral ankle pain and fatigue  Stairs            Wheelchair Mobility    Modified Rankin (Stroke Patients Only)       Balance Overall balance assessment: Needs assistance Sitting-balance support: Feet supported;No upper extremity supported Sitting balance-Leahy Scale: Good     Standing balance support: During functional activity;No upper extremity supported Standing balance-Leahy Scale: Fair Standing balance comment: fair/good using RW                             Pertinent Vitals/Pain Pain Assessment: Faces Faces Pain Scale: Hurts little more Pain Location: ankles Pain Descriptors / Indicators: Sore Pain Intervention(s): Limited activity within patient's tolerance;Monitored during session    Home Living Family/patient expects to be discharged to:: Private residence Living Arrangements: Spouse/significant other;Children Available Help at Discharge: Family;Available 24 hours/day Type of Home: House Home Access: Stairs to enter Entrance Stairs-Rails: None Entrance Stairs-Number of Steps: 1 Home Layout: Two level;Able to live on main level with bedroom/bathroom Home Equipment: Kasandra Knudsen - single point;Walker - 2 wheels;Walker - 4 wheels;Bedside commode;Shower seat;Wheelchair - manual      Prior Function Level of Independence: Independent with assistive device(s);Needs assistance   Gait / Transfers Assistance Needed: Household ambulator with RW, home O2 dependent usually 3 LPM  ADL's / [employer]  Needed: assisted by family        Hand Dominance   Dominant Hand: Right    Extremity/Trunk Assessment   Upper Extremity Assessment Upper Extremity Assessment: Overall WFL for tasks  assessed         Cervical / Trunk Assessment Cervical / Trunk Assessment: Normal  Communication   Communication: No difficulties  Cognition Arousal/Alertness: Awake/alert Behavior During Therapy: WFL for tasks assessed/performed;Agitated Overall Cognitive Status: Within Functional Limits for tasks assessed                                 General Comments: easily agitated, easily distracted      General Comments      Exercises     Assessment/Plan    PT Assessment Patient needs continued PT services  PT Problem List Decreased strength;Decreased activity tolerance;Decreased balance;Decreased mobility       PT Treatment Interventions Therapeutic exercise;Gait training;Stair training;Functional mobility training;Therapeutic activities;Patient/family education    PT Goals (Current goals can be found in the Care Plan section)  Acute Rehab PT Goals Patient Stated Goal: return home with family to assist PT Goal Formulation: With patient Time For Goal Achievement: 09/15/18 Potential to Achieve Goals: Good    Frequency Min 3X/week   Barriers to discharge        Co-evaluation               AM-PAC PT "6 Clicks" Mobility  Outcome Measure Help needed turning from your back to your side while in a flat bed without using bedrails?: None Help needed moving from lying on your back to sitting on the side of a flat bed without using bedrails?: None Help needed moving to and from a bed to a chair (including a wheelchair)?: None Help needed standing up from a chair using your arms (e.g., wheelchair or bedside chair)?: None Help needed to walk in hospital room?: A Little Help needed climbing 3-5 steps with a railing? : A Lot 6 Click Score: 21    End of Session Equipment Utilized During Treatment: Gait belt;Oxygen Activity Tolerance: Patient tolerated treatment well;Patient limited by fatigue Patient left: in chair;with call bell/phone within reach Nurse  Communication: Mobility status PT Visit Diagnosis: Unsteadiness on feet (R26.81);Other abnormalities of gait and mobility (R26.89);Muscle weakness (generalized) (M62.81)    Time: 7544-9201 PT Time Calculation (min) (ACUTE ONLY): 22 min   Charges:   PT Evaluation $PT Eval Moderate Complexity: 1 Mod PT Treatments $Therapeutic Activity: 8-22 mins        2:33 PM, 09/10/18 Lonell Grandchild, MPT Physical Therapist with [facility] [address] [phone] office [phone] mobile phone

## 2018-09-10 NOTE — Plan of Care (Signed)
  Problem: Acute Rehab PT Goals(only PT should resolve) Goal: Pt Will Ambulate Outcome: Progressing Flowsheets (Taken 09/10/2018 1436) Pt will Ambulate: 50 feet; with supervision; with rolling walker   2:36 PM, 09/10/18 Lonell Grandchild, MPT Physical Therapist with [facility] [address] [phone] office [phone] mobile phone

## 2018-09-10 NOTE — Progress Notes (Addendum)
Patient awake from sleep, yelling and screaming obscenities.  Patient is disruptive and  patient nurse is unable to calm patient.  Patient is having severe hallucinations and not displaying logical thought processes.   Patient is continually disruptive, anxious, and easily agitated. Patient nurse notified daughter and MD notified.

## 2018-09-10 NOTE — Progress Notes (Signed)
PROGRESS NOTE    Christine Ortega  AYT:[id number] DOB: 01/01/1961 DOA: 09/07/2018 PCP: Lucia Gaskins, MD   Brief Narrative:  HPI per Dr. Loma Ortega on 09/07/2018 HPI: Christine Ortega is a 58 y.o. female with a history of seizure disorder, schizophrenia, depression, chronic pain, pulmonary fibrosis, history of DVT on eliquis.  The patient has been admitted twice since January for community-acquired pneumonia respiratory failure.  She feels that she breathes much better when on steroids and her breathing gets worse off steroids.  This last episode started approximately 2 weeks ago with a gradual decline.  She is now dyspneic to just a few steps.  She does have oxygen at home, which she needed to turn up as well.  She has been using her inhalers that she has been prescribed.  White sputum production with no other palliating or provoking factors.  Emergency Department Course: Patient started on IV steroids, healthcare associated pneumonia antibiotics.  Blood cultures obtained.  **Interim History Continues to not feel well and becomes dyspneic and severely coughing.  Has several coughing fits and is difficult to gather her breath.  Pulmonary was consulted for further evaluation recommendations and they are agreeing with current management.  Became very paranoid and psychotic and very agitated and was hallucinating significantly.  Geodon was restarted however patient refused at this morning.  Still complaining of pain and wanting some more pain medications.  Initially refused her potassium supplementation but was then agreeable when I walked in the room.  Assessment & Plan:   Principal Problem:   Acute on chronic respiratory failure with hypoxia (HCC) Active Problems:   Narcotic abuse in remission (HCC)   Bipolar disorder (HCC)   Chronic pain   ADD (attention deficit disorder)   Schizophrenia, paranoid type (HCC)   Thrombocytosis (HCC)   Hypokalemia   HCAP (healthcare-associated pneumonia)    History of DVT (deep vein thrombosis)   Normocytic anemia   GERD (gastroesophageal reflux disease)   Hyperglycemia   COPD with acute exacerbation (HCC)   Pulmonary fibrosis (HCC)  Acute on Chronic Respiratory Failure with hypoxia in the setting of HCAP, COPD, and Pulmonary Fibrosis, -Admitted to Inpatient Telemetry -CXR on Admission showed cardiomegaly and asymmetric increased opacity in the right mid and lower lung zones which could represent infection or edema as well as worsening aeration.  There is also chronic interstitial lung disease -CXR this AM showed "Mild interval improvement diffuse bilateral airspace opacities. Findings may be secondary to infection or edema chronic background of fibrotic changes" -Continue supplemental oxygen via nasal cannula and wean O2 as tolerated; had some hypoxia episodes overnight and was significant hallucinations and had to be turned up to 5 L of O2 via nasal cannula -Continuous pulse oximetry and maintain O2 saturations greater than 92%; HAD nocturnal Dyspnea and Hypoxia -C/w IV Solu-Medrol 60 mg every 6h weaned to q12h given patient's Psychosis and will continue to wean to 40 mg q12h today.  She received a dose of IV Solu-Medrol 125 mg in the ED -Continue with Duoneb 3 mL's every 6 scheduled along albuterol 2.5 mg nebs every 2 PRN for shortness of breath; cContinue with Breo Ellipta 1 puff ventilation daily along with Incruse Ellipta 1 puff daily -We will add guaifenesin 1200 mg p.o. twice daily along with Hycodan 5 mL's p.o. every 6 as needed for cough; benzonatate 200 mg was given once in the ED yesterday -ProCalcitonin level went from 10.58 and  is trending down and now 1.93 -BNP on admission was  69.0 -WBC went from 13.1 -> 9.1 -> 25.9 -> 24.9 -Checked Respiratory Virus Panel and was Negative  -SARS-CoV-2 virus was negative -We will need home ambulatory screen prior to discharge along with physical therapy and Occupational Therapy consultations  -Continue with PPI twice daily given steroids to prevent a gastritis and Gastric Ulcer -Given one-time dose of IV Lasix 40 mg and patient does not feel it helped -Continue to monitor respiratory status carefully and repeat chest x-ray in the a.m. -Pulmonary Consulted and Following; Appreciate further evaluation and recommendations and Pulmonary recommending continuing current treatments   Healthcare Associated Pneumonia -Treatment as above -Started on antibiotic coverage with IV vancomycin IV cefepime for now and will de-escalate and stop IV vancomycin given MRSA negative PCR -Blood cultures x2 showed no growth to date less than 12 hours -Sputum culture never obtained will obtain now -Strep Urinary and Legionella Urine Ag Pending  -Checked for Respiratory Virus Panel and was negative -WBC was trending down but worsened in the setting of IV Steroid Demargination; Now weaning Steroids  -Procalcitonin is trending down  -Continue monitor repeat CBC in the a.m.  History of DVT -C/w Apixaban 5 mg po BID  Pulmonary Fibrosis -Continue with Duoneb 3 mL's every 6 scheduled along albuterol 2.5 mg nebs every 2 PRN for shortness of breath; cContinue with Breo Ellipta 1 puff ventilation daily along with Incruse Ellipta 1 puff daily -We will add guaifenesin 1200 mg p.o. twice daily along with Hycodan 5 mL's p.o. every 6 as needed for cough; benzonatate 200 mg was given once in the ED yesterday -Pulmonary evaluating and recommending continuing current treatments  Acute on Chronic COPD Exacerbation -Treatment as Above -C/w IV Steroids but change to q12h instead of q6h, Nebs, and Abx; C/w Supplemental O2 via [place]  -Will need Ambulatory Home O2 Screen prior to D/C -C/w Breo-Ellipta and Incruse Ellipta  Bipolar Disorder/Anxeity/Depression/ADD/ Paranoid Schizophrenia with Psychosis and Behavioral Disturbances -C/w Fluoxetine 40 mg po Daily  -Per MAR no longer taking Home Ziprazidone 80 mg po qHS however  resumed at 40 mg BID given her significant hallucinations and Agitation with Behavioral Disturbances; Patient is talking to the trash can and casting out demons and speaking with people that are not there screaming and continued yesterday night but this AM was calm and collected and remembered that Dr. Maudie Mercury saw her overnight  -1:1 Sitter initiated   -Will need Behavioral Health Evaluation -Continue to Re-orient   History of Narcotic Abuse and Chronic Pain -Given IV Morphine 4 mg x2 in the ED -Judicious use of narcotics given history of narcotic abuse and currently she is placed on Oxycodone 5 mg p.o. every 6 PRN for severe pain and increased to 1-2 tabs;  -Add Lidocaine Patch and Voltaren gel  -AVOID IV NARCOTICS if POSSIBLE -Continue with Hydrocodone-Homatroprine for Cough  GERD -C/w PPI BID   Normocytic Anemia -Patient's hemoglobin/hematocrit went from 11.6/36.5 is now 11.1/34.9 -Check Anemia panel and showed an iron level of 27, U IBC of 289, TIBC 316, saturation ratio 9%, ferritin level 8, folate level 11.2, vitamin B12 level 1015 -Continue monitor for signs and symptoms of bleeding; currently no evidence of any overt bleeding -Continue monitor and trend -Repeat CBC in the a.m.  Thrombocytosis -Likely reactive and is trending down -Platelet count is trending downward from 535 -> 505 -> 592 -> 554 -Continue monitor and trend -Repeat CBC in a.m.  Hypokalemia -Patient's potassium admission was 2.7 -Replete with po KCl 40 mEQ BID and was going to  replete with IV KCl 30 mEQ but patient refused  -Mag Level was 2.0 -Continue monitor and trend -Continue monitor and trend  Hyperglycemia in the setting of Pre-Diabetes -Patient's blood sugar on CMP on admission was 93 is now 159 on BMP this morning-likely in the setting of IV steroids demargination-continue monitor and trend carefully -Blood sugars remain consistently elevated will place on considerable sliding scale insulin AC -Check  hemoglobin A1c to rule out Diabetes and her HbA1c was 5.8  Leukocytosis -Likely worsened in the setting of IV steroid demargination as patient's pneumonia is improving procalcitonin is trending down -Patient is currently afebrile -Patient's WBC went from 9.1 -> 25.9 -> 24.9 -IV steroids have been tapered and she is now on Solu-Medrol 60 mg every 12 and will wean to 40 mg q12 later today  -Continue monitor for signs and symptoms of infection and she is currently being treated with IV cefepime -Repeat CBC in the a.m.  DVT prophylaxis: Anticoagulated with Apixaban Code Status: FULL CODE  Family Communication: No family present at bedside  Disposition Plan: Remain Inpatient for continued workup and Treatment  Consultants:   Pulmonary Dr. Luan Pulling  Will need TTS Consult to Psych   Procedures:  None   Antimicrobials:  Anti-infectives (From admission, onward)   Start     Dose/Rate Route Frequency Ordered Stop   09/08/18 1400  ceFEPIme (MAXIPIME) 2 g in sodium chloride 0.9 % 100 mL IVPB     2 g 200 mL/hr over 30 Minutes Intravenous Every 8 hours 09/08/18 0849 09/15/18 2159   09/08/18 0600  vancomycin (VANCOCIN) 500 mg in sodium chloride 0.9 % 100 mL IVPB  Status:  Discontinued     500 mg 100 mL/hr over 60 Minutes Intravenous Every 12 hours 09/07/18 2216 09/08/18 1302   09/07/18 2200  ceFEPIme (MAXIPIME) 1 g in sodium chloride 0.9 % 100 mL IVPB  Status:  Discontinued     1 g 200 mL/hr over 30 Minutes Intravenous Every 8 hours 09/07/18 2028 09/08/18 0849   09/07/18 1745  vancomycin (VANCOCIN) IVPB 1000 mg/200 mL premix     1,000 mg 200 mL/hr over 60 Minutes Intravenous  Once 09/07/18 1732 09/07/18 1941   09/07/18 1745  piperacillin-tazobactam (ZOSYN) IVPB 3.375 g     3.375 g 12.5 mL/hr over 240 Minutes Intravenous  Once 09/07/18 1732 09/07/18 2229     Subjective: Seen and examined at bedside and she is calm today and agreeable to take p.o. potassium chloride supplementation.   Overnight she had significant hallucinations and became hypoxic and anxious.  Is having significant pain on her right mid back and over her ribs and was given one-time dose of oxycodone 10 mg.  Patient was hyperventilating and her saturations improved after a nebulizer treatment.  She denies any nausea or vomiting now but still asking about pain medications.  No other concerns or complaints at this time.  Objective: Vitals:   09/10/18 0045 09/10/18 0222 09/10/18 0503 09/10/18 0746  BP:   137/85   Pulse:   (!) 115   Resp:   18   Temp:   98.5 F (36.9 C)   TempSrc:   Oral   SpO2: 98% (!) 88% 99% 94%  Weight:      Height:        Intake/Output Summary (Last 24 hours) at 09/10/2018 1218 Last data filed at 09/10/2018 0921 Gross per 24 hour  Intake 2100 ml  Output 2450 ml  Net -350 ml   [employer]  09/07/18 1432  Weight: 54.4 kg   Examination: Physical Exam:  Constitutional: Thin Caucasian female who appears older than her stated age is not as anxious today and not hallucinating currently Eyes: Lids and conjunctive are normal.  Sclera anicteric ENMT: External ears nose were normal.  Grossly normal hearing. Mucous membranes appear moist Neck: Appears supple no JVD Respiratory: Diminished auscultation bilaterally with coarse breath sounds and some rhonchi.  Wheezing is diminished.  Not coughing during examination and has a mildly increased respiratory rate but is wearing supplemental oxygen via nasal cannula on 4 L Cardiovascular: Tachycardic rate but regular rhythm.  Has trace to 1+ lower extremity edema again Abdomen: Soft, not tender to palpate and is not distended.  Bowel sounds present GU: Deferred Musculoskeletal: No contractures or cyanosis.  No joint deformities in upper extremity Skin: Skin is warm and dry no appreciable rashes or lesions on to skin evaluation Neurologic: Cranial nerves II through XII grossly intact no appreciable focal deficits Psychiatric: She is calm now  and not as anxious.  Is not actively hallucinating now.  Data Reviewed: I have personally reviewed following labs and imaging studies  CBC: Recent Labs  Lab 09/07/18 1556 09/08/18 0555 09/09/18 0712 09/10/18 0556  WBC 13.1* 9.1 25.9* 24.9*  NEUTROABS 6.6  --  21.1* 21.9*  HGB 11.6* 11.3* 11.2* 11.1*  HCT 36.5 35.3* 35.2* 34.9*  MCV 98.1 96.7 95.4 96.7  PLT 535* 505* 592* 213*   Basic Metabolic Panel: Recent Labs  Lab 09/07/18 1556 09/08/18 0555 09/09/18 0712 09/10/18 0556  NA 142 137 142 139  K 3.0* 3.5 3.3* 2.7*  CL 106 100 99 100  CO2 26 24 28 26   GLUCOSE 93 159* 137* 147*  BUN 7 6 11 11   CREATININE 0.52 0.53 0.56 0.60  CALCIUM 8.8* 8.8* 8.9 9.2  MG  --   --  1.9 2.0  PHOS  --   --  3.3 2.7   GFR: Estimated Creatinine Clearance: 64.2 mL/min (by C-G formula based on SCr of 0.6 mg/dL). Liver Function Tests: Recent Labs  Lab 09/07/18 1556 09/09/18 0712 09/10/18 0556  AST 21 18 34  ALT 10 11 16   ALKPHOS 123 104 101  BILITOT 0.4 0.3 0.4  PROT 6.6 6.6 6.8  ALBUMIN 2.8* 2.9* 3.1*   No results for input(s): LIPASE, AMYLASE in the last 168 hours. No results for input(s): AMMONIA in the last 168 hours. Coagulation Profile: No results for input(s): INR, PROTIME in the last 168 hours. Cardiac Enzymes: Recent Labs  Lab 09/07/18 1556  TROPONINI <0.03   BNP (last 3 results) No results for input(s): PROBNP in the last 8760 hours. HbA1C: No results for input(s): HGBA1C in the last 72 hours. CBG: No results for input(s): GLUCAP in the last 168 hours. Lipid Profile: No results for input(s): CHOL, HDL, LDLCALC, TRIG, CHOLHDL, LDLDIRECT in the last 72 hours. Thyroid Function Tests: No results for input(s): TSH, T4TOTAL, FREET4, T3FREE, THYROIDAB in the last 72 hours. Anemia Panel: Recent Labs    09/10/18 0556  VITAMINB12 1,015*  FOLATE 11.2  FERRITIN 8*  TIBC 316  IRON 27*  RETICCTPCT 1.7   Sepsis Labs: Recent Labs  Lab 09/07/18 1556 09/08/18 0555  09/09/18 0712  PROCALCITON 10.58 5.15 1.93    Recent Results (from the past 240 hour(s))  SARS Coronavirus 2 (CEPHEID- Performed in [facility] hospital lab), Hosp Order     Status: None   Collection Time: 09/07/18  3:56 PM  Result Value Ref  Range Status   SARS Coronavirus 2 NEGATIVE NEGATIVE Final    Comment: (NOTE) If result is NEGATIVE SARS-CoV-2 target nucleic acids are NOT DETECTED. The SARS-CoV-2 RNA is generally detectable in upper and lower  respiratory specimens during the acute phase of infection. The lowest  concentration of SARS-CoV-2 viral copies this assay can detect is 250  copies / mL. A negative result does not preclude SARS-CoV-2 infection  and should not be used as the sole basis for treatment or other  patient management decisions.  A negative result may occur with  improper specimen collection / handling, submission of specimen other  than nasopharyngeal swab, presence of viral mutation(s) within the  areas targeted by this assay, and inadequate number of viral copies  (<250 copies / mL). A negative result must be combined with clinical  observations, patient history, and epidemiological information. If result is POSITIVE SARS-CoV-2 target nucleic acids are DETECTED. The SARS-CoV-2 RNA is generally detectable in upper and lower  respiratory specimens dur ing the acute phase of infection.  Positive  results are indicative of active infection with SARS-CoV-2.  Clinical  correlation with patient history and other diagnostic information is  necessary to determine patient infection status.  Positive results do  not rule out bacterial infection or co-infection with other viruses. If result is PRESUMPTIVE POSTIVE SARS-CoV-2 nucleic acids MAY BE PRESENT.   A presumptive positive result was obtained on the submitted specimen  and confirmed on repeat testing.  While 2019 novel coronavirus  (SARS-CoV-2) nucleic acids may be present in the submitted sample  additional  confirmatory testing may be necessary for epidemiological  and / or clinical management purposes  to differentiate between  SARS-CoV-2 and other Sarbecovirus currently known to infect humans.  If clinically indicated additional testing with an alternate test  methodology [phone]) is advised. The SARS-CoV-2 RNA is generally  detectable in upper and lower respiratory sp ecimens during the acute  phase of infection. The expected result is Negative. Fact Sheet for Patients:  StrictlyIdeas.no Fact Sheet for Healthcare Providers: BankingDealers.co.za This test is not yet approved or cleared by the [place] FDA and has been authorized for detection and/or diagnosis of SARS-CoV-2 by FDA under an Emergency Use Authorization (EUA).  This EUA will remain in effect (meaning this test can be used) for the duration of the COVID-19 declaration under Section 564(b)(1) of the Act, 21 U.S.C. section 360bbb-3(b)(1), unless the authorization is terminated or revoked sooner. Performed at [facility], [address] [place], [place] [address]   MRSA PCR Screening     Status: None   Collection Time: 09/07/18  6:53 PM  Result Value Ref Range Status   MRSA by PCR NEGATIVE NEGATIVE Final    Comment:        The GeneXpert MRSA Assay (FDA approved for NASAL specimens only), is one component of a comprehensive MRSA colonization surveillance program. It is not intended to diagnose MRSA infection nor to guide or monitor treatment for MRSA infections. Performed at [facility], [address] [place], [place] [address]   Culture, blood (routine x 2) Call MD if unable to obtain prior to antibiotics being given     Status: None (Preliminary result)   Collection Time: 09/07/18  9:22 PM  Result Value Ref Range Status   Specimen Description BLOOD LEFT HAND  Final   Special Requests   Final    BOTTLES DRAWN AEROBIC AND ANAEROBIC Blood Culture adequate volume    Culture   Final  NO GROWTH 3 DAYS Performed at [facility], [address], [place], [place] [address]    Report Status PENDING  Incomplete  Culture, blood (routine x 2) Call MD if unable to obtain prior to antibiotics being given     Status: None (Preliminary result)   Collection Time: 09/07/18  9:34 PM  Result Value Ref Range Status   Specimen Description BLOOD RIGHT ARM  Final   Special Requests   Final    BOTTLES DRAWN AEROBIC AND ANAEROBIC Blood Culture adequate volume   Culture   Final    NO GROWTH 3 DAYS Performed at [facility], [address] [place], [place] [address]    Report Status PENDING  Incomplete  Respiratory Panel by PCR     Status: None   Collection Time: 09/08/18  1:00 PM  Result Value Ref Range Status   Adenovirus NOT DETECTED NOT DETECTED Final   Coronavirus 229E NOT DETECTED NOT DETECTED Final    Comment: (NOTE) The Coronavirus on the Respiratory Panel, DOES NOT test for the novel  Coronavirus (2019 nCoV)    Coronavirus HKU1 NOT DETECTED NOT DETECTED Final   Coronavirus NL63 NOT DETECTED NOT DETECTED Final   Coronavirus OC43 NOT DETECTED NOT DETECTED Final   Metapneumovirus NOT DETECTED NOT DETECTED Final   Rhinovirus / Enterovirus NOT DETECTED NOT DETECTED Final   Influenza A NOT DETECTED NOT DETECTED Final   Influenza B NOT DETECTED NOT DETECTED Final   Parainfluenza Virus 1 NOT DETECTED NOT DETECTED Final   Parainfluenza Virus 2 NOT DETECTED NOT DETECTED Final   Parainfluenza Virus 3 NOT DETECTED NOT DETECTED Final   Parainfluenza Virus 4 NOT DETECTED NOT DETECTED Final   Respiratory Syncytial Virus NOT DETECTED NOT DETECTED Final   Bordetella pertussis NOT DETECTED NOT DETECTED Final   Chlamydophila pneumoniae NOT DETECTED NOT DETECTED Final   Mycoplasma pneumoniae NOT DETECTED NOT DETECTED Final    Comment: Performed at [facility], [address] [place], [place] [address]    Radiology Studies: Dg Chest Port 1 View  Result Date:  09/09/2018 CLINICAL DATA:  Shortness of breath EXAM: PORTABLE CHEST 1 VIEW COMPARISON:  Chest radiograph 08/08/2018 FINDINGS: Monitoring leads overlie the patient. Stable cardiomegaly. Slight improved aeration mild improvement in diffuse bilateral airspace opacities. No pleural effusion or pneumothorax. IMPRESSION: Mild interval improvement diffuse bilateral airspace opacities. Findings may be secondary to infection or edema chronic background of fibrotic changes. Electronically Signed   By: Lovey Newcomer M.D.   On: 09/09/2018 09:11   Scheduled Meds: . apixaban  5 mg Oral BID  . benzonatate  100 mg Oral TID  . diclofenac sodium  2 g Topical QID  . feeding supplement (ENSURE ENLIVE)  237 mL Oral BID BM  . FLUoxetine  40 mg Oral Daily  . fluticasone furoate-vilanterol  1 puff Inhalation Daily   And  . umeclidinium bromide  1 puff Inhalation Daily  . guaiFENesin  1,200 mg Oral BID  . ipratropium-albuterol  3 mL Nebulization Q6H  . lidocaine  1 patch Transdermal Q24H  . methylPREDNISolone (SOLU-MEDROL) injection  60 mg Intravenous Q12H  . pantoprazole  40 mg Oral BID  . pneumococcal 23 valent vaccine  0.5 mL Intramuscular Tomorrow-1000  . potassium chloride  40 mEq Oral BID  . ziprasidone  40 mg Oral BID WC   Continuous Infusions: . ceFEPime (MAXIPIME) IV 2 g (09/10/18 0553)    LOS: 3 days    Kerney Elbe, DO [facility] PAGER  is on AMION  If 7PM-7AM, please contact night-coverage [email or web address] Password [facility] 09/10/2018, 12:18 PM

## 2018-09-10 NOTE — Progress Notes (Signed)
Patient complaining of sharp pain that started from lower back into her neck. Pain rated at a 10. Patient hyperventilating and unable to take deep breath. O2 sat in the 80's. Respiratory notified. Dr. Maudie Mercury to the floor to see patient. New order for one time dose of Oxy 10mg  and CXR.

## 2018-09-10 NOTE — Progress Notes (Signed)
Patient given breathing treatment. O2 sat 97% on 5L [place]. Pain medication given. Daughter at bedside with approval from nursing supervisor due to visitor restrictions. Patient is resting in bed, denies need for anything at this time. Will continue to monitor.

## 2018-09-11 ENCOUNTER — Inpatient Hospital Stay (HOSPITAL_COMMUNITY): Payer: Medicare Other

## 2018-09-11 LAB — COMPREHENSIVE METABOLIC PANEL
ALT: 16 U/L (ref 0–44)
AST: 22 U/L (ref 15–41)
Albumin: 2.8 g/dL — ABNORMAL LOW (ref 3.5–5.0)
Alkaline Phosphatase: 88 U/L (ref 38–126)
Anion gap: 9 (ref 5–15)
BUN: 10 mg/dL (ref 6–20)
CO2: 26 mmol/L (ref 22–32)
Calcium: 9 mg/dL (ref 8.9–10.3)
Chloride: 106 mmol/L (ref 98–111)
Creatinine, Ser: 0.53 mg/dL (ref 0.44–1.00)
GFR calc Af Amer: >60 mL/min (ref >60)
GFR calc non Af Amer: >60 mL/min (ref >60)
Glucose, Bld: 170 mg/dL — ABNORMAL HIGH (ref 70–99)
Potassium: 3.5 mmol/L (ref 3.5–5.1)
Sodium: 141 mmol/L (ref 135–145)
Total Bilirubin: 0.6 mg/dL (ref 0.3–1.2)
Total Protein: 6.3 g/dL — ABNORMAL LOW (ref 6.5–8.1)

## 2018-09-11 LAB — CBC WITH DIFFERENTIAL/PLATELET
Abs Immature Granulocytes: 0.33 10*3/uL — ABNORMAL HIGH (ref 0.00–0.07)
Basophils Absolute: 0 10*3/uL (ref 0.0–0.1)
Basophils Relative: 0 %
Eosinophils Absolute: 0 10*3/uL (ref 0.0–0.5)
Eosinophils Relative: 0 %
HCT: 35 % — ABNORMAL LOW (ref 36.0–46.0)
Hemoglobin: 11.1 g/dL — ABNORMAL LOW (ref 12.0–15.0)
Immature Granulocytes: 1 %
Lymphocytes Relative: 9 %
Lymphs Abs: 2 10*3/uL (ref 0.7–4.0)
MCH: 30.9 pg (ref 26.0–34.0)
MCHC: 31.7 g/dL (ref 30.0–36.0)
MCV: 97.5 fL (ref 80.0–100.0)
Monocytes Absolute: 1.8 10*3/uL — ABNORMAL HIGH (ref 0.1–1.0)
Monocytes Relative: 8 %
Neutro Abs: 19 10*3/uL — ABNORMAL HIGH (ref 1.7–7.7)
Neutrophils Relative %: 82 %
Platelets: 496 10*3/uL — ABNORMAL HIGH (ref 150–400)
RBC: 3.59 MIL/uL — ABNORMAL LOW (ref 3.87–5.11)
RDW: 14.2 % (ref 11.5–15.5)
WBC: 23.2 10*3/uL — ABNORMAL HIGH (ref 4.0–10.5)
nRBC: 0 % (ref 0.0–0.2)

## 2018-09-11 LAB — LEGIONELLA PNEUMOPHILA SEROGP 1 UR AG: L. pneumophila Serogp 1 Ur Ag: NEGATIVE

## 2018-09-11 LAB — PHOSPHORUS: Phosphorus: 2.4 mg/dL — ABNORMAL LOW (ref 2.5–4.6)

## 2018-09-11 LAB — MAGNESIUM: Magnesium: 2 mg/dL (ref 1.7–2.4)

## 2018-09-11 MED ORDER — K PHOS MONO-SOD PHOS DI & MONO 155-852-130 MG PO TABS
500.0000 mg | ORAL_TABLET | Freq: Once | ORAL | Status: AC
  Administered 2018-09-11: 500 mg via ORAL
  Filled 2018-09-11: qty 2

## 2018-09-11 MED ORDER — IPRATROPIUM-ALBUTEROL 0.5-2.5 (3) MG/3ML IN SOLN
3.0000 mL | Freq: Three times a day (TID) | RESPIRATORY_TRACT | Status: DC
  Administered 2018-09-12 – 2018-09-13 (×4): 3 mL via RESPIRATORY_TRACT
  Filled 2018-09-11 (×3): qty 3

## 2018-09-11 MED ORDER — POTASSIUM CHLORIDE CRYS ER 20 MEQ PO TBCR
40.0000 meq | EXTENDED_RELEASE_TABLET | Freq: Two times a day (BID) | ORAL | Status: AC
  Administered 2018-09-11 (×2): 40 meq via ORAL
  Filled 2018-09-11 (×2): qty 2

## 2018-09-11 NOTE — Progress Notes (Addendum)
PROGRESS NOTE    Christine Ortega  FIE:[id number] DOB: [birth year] DOA: 09/07/2018 PCP: Lucia Gaskins, MD   Brief Narrative:  HPI per Dr. Loma Ortega on 09/07/2018 HPI: Christine Ortega is a 58 y.o. female with a history of seizure disorder, schizophrenia, depression, chronic pain, pulmonary fibrosis, history of DVT on eliquis.  The patient has been admitted twice since January for community-acquired pneumonia respiratory failure.  She feels that she breathes much better when on steroids and her breathing gets worse off steroids.  This last episode started approximately 2 weeks ago with a gradual decline.  She is now dyspneic to just a few steps.  She does have oxygen at home, which she needed to turn up as well.  She has been using her inhalers that she has been prescribed.  White sputum production with no other palliating or provoking factors.  Emergency Department Course: Patient started on IV steroids, healthcare associated pneumonia antibiotics.  Blood cultures obtained.  **Interim History Has had several coughing fits and is difficult to gather her breath but reportedly only does it when I walk into the room.  Pulmonary was consulted for further evaluation recommendations and they are agreeing with current management.  Became very paranoid and psychotic and very agitated and was hallucinating significantly her steroids were then weaned and now on 40 mg every 12.  Geodon was restarted however patient refused yesterday.  Still complaining of pain and wanting some more pain medications.  He states she choked on her food this morning so a speech therapy evaluation was done and they wanted to pursued an MBS however patient refused and also refused the dysphagia diet that was recommended.  Patient was more pleasant today but still acting very paranoid and asking for more pain medications  Assessment & Plan:   Principal Problem:   Acute on chronic respiratory failure with hypoxia ([place]) Active  Problems:   Narcotic abuse in remission ([place])   Bipolar disorder ([place])   Chronic pain   ADD (attention deficit disorder)   Schizophrenia, paranoid type ([place])   Thrombocytosis (HCC)   Hypokalemia   HCAP (healthcare-associated pneumonia)   History of DVT (deep vein thrombosis)   Normocytic anemia   GERD (gastroesophageal reflux disease)   Hyperglycemia   COPD with acute exacerbation (HCC)   Pulmonary fibrosis (HCC)  Acute on Chronic Respiratory Failure with hypoxia in the setting of HCAP, COPD, and Pulmonary Fibrosis, slowly improving -Admitted to Inpatient Telemetry -CXR on Admission showed cardiomegaly and asymmetric increased opacity in the right mid and lower lung zones which could represent infection or edema as well as worsening aeration.  There is also chronic interstitial lung disease -CXR this AM showed "Mild interval improvement diffuse bilateral airspace opacities. Findings may be secondary to infection or edema chronic background of fibrotic changes" -Continue supplemental oxygen via nasal cannula and wean O2 as tolerated; had some hypoxia episodes the night before last and had significant hallucinations and had to be turned up to 5 L of O2 via nasal cannula -Continuous pulse oximetry and maintain O2 saturations greater than 92%; HAD nocturnal Dyspnea and Hypoxia -Weaned Solumedrol to 40 mg q12h and will continue today .  She received a dose of IV Solu-Medrol 125 mg in the ED -Continue with Duoneb 3 mL's every 6 scheduled along albuterol 2.5 mg nebs every 2 PRN for shortness of breath; cContinue with Breo Ellipta 1 puff ventilation daily along with Incruse Ellipta 1 puff daily -We will add guaifenesin 1200 mg p.o. twice  daily along with Hycodan 5 mL's p.o. every 6 as needed for cough; benzonatate 200 mg was given once in the ED yesterday -ProCalcitonin level went from 10.58 and  is trending down and now 1.93 -BNP on admission was 69.0 -WBC went from 13.1 -> 9.1 -> 25.9 -> 24.9 ->  23.2 -Checked Respiratory Virus Panel and was Negative  -SARS-CoV-2 virus was negative -We will need home ambulatory screen prior to discharge along with physical therapy and Occupational Therapy consultations -Continue with PPI twice daily given steroids to prevent a gastritis and Gastric Ulcer -Given one-time dose of IV Lasix 40 mg and patient does not feel it helped -Patient started coughing with her food this morning so speech therapy evaluation was ordered and they recommended an MBS but patient refused and patient also refusing dysphagia 3 diet patient states that she needs to take her time and eating and understands the risk for aspiration and she will remain on a regular diet with thin liquids -Continue to monitor respiratory status carefully and repeat chest x-ray in the a.m. -Pulmonary Consulted and Following; Appreciate further evaluation and recommendations and Pulmonary recommending continuing current treatments  -Repeat CXR in AM along with Rib views given that she states that she feels she has a cracked rib  Healthcare Associated Pneumonia -Treatment as above -Started on antibiotic coverage with IV vancomycin IV cefepime for now and will de-escalate and stop IV vancomycin given MRSA negative PCR -Blood cultures x2 showed no growth to date at 4 days -Sputum culture never obtained will obtain now -Strep Urinary and Legionella Urine Ag both are negative -Checked for Respiratory Virus Panel and was negative -WBC was trending down but worsened in the setting of IV Steroid Demargination; Now weaning Steroids and she is on 40 every 12 -Procalcitonin is trending down  -Continue monitor repeat CBC in the a.m.  History of DVT -C/w Apixaban 5 mg po BID  Pulmonary Fibrosis -Continue with Duoneb 3 mL's every 6 scheduled along albuterol 2.5 mg nebs every 2 PRN for shortness of breath; cContinue with Breo Ellipta 1 puff ventilation daily along with Incruse Ellipta 1 puff daily -We will add  guaifenesin 1200 mg p.o. twice daily along with Hycodan 5 mL's p.o. every 6 as needed for cough; benzonatate 200 mg was given once in the ED yesterday -Pulmonary evaluating and recommending continuing current treatments today  Acute on Chronic COPD Exacerbation -Treatment as Above -C/w IV Steroids but change to q12h instead of q6h and have decreased the dose from 60-40 yesterday and will continue at 40 mg every 12 today, Nebs, and Abx; C/w Supplemental O2 via [place]  -Will need Ambulatory Home O2 Screen prior to D/C -C/w Breo-Ellipta and Incruse Ellipta  Bipolar Disorder/Anxeity/Depression/ADD/ Paranoid Schizophrenia with Psychosis and Behavioral Disturbances -C/w Fluoxetine 40 mg po Daily  -Per MAR no longer taking Home Ziprazidone 80 mg po qHS however resumed at 40 mg BID given her significant hallucinations and Agitation with Behavioral Disturbances; Patient is talking to the trash can and casting out demons and speaking with people that are not there screaming and continued yesterday night but this AM was calm and collected and remembered that Dr. Maudie Mercury saw her overnight  -1:1 Sitter initiated but now this is improved and still one-to-one sitter has been discontinued -Will need Behavioral Health Evaluation prior to discharge when she is medically stable -Continue to Re-orient   History of Narcotic Abuse and Chronic Pain -Given IV Morphine 4 mg x2 in the ED -Judicious use of narcotics  given history of narcotic abuse and currently she is placed on Oxycodone 5 mg p.o. every 6 PRN for severe pain and increased to 1-2 tabs; patient is continually asking for her oxycodone and asked for it earlier today -Add Lidocaine Patch and Voltaren gel  -AVOID IV NARCOTICS if POSSIBLE -Continue with Hydrocodone-Homatroprine for Cough  GERD -C/w PPI BID   Normocytic Anemia -Patient's hemoglobin/hematocrit went from 11.6/36.5 is now 11.1/35.0 -Check Anemia panel and showed an iron level of 27, U IBC of 289,  TIBC 316, saturation ratio 9%, ferritin level 8, folate level 11.2, vitamin B12 level 1015 -Continue monitor for signs and symptoms of bleeding; currently no evidence of any overt bleeding -Continue monitor and trend -Repeat CBC in the a.m.  Thrombocytosis -Likely reactive and is trending down -Platelet count is trending downward from 535 -> 505 -> 592 -> 554 -> 496 -Continue monitor and trend -Repeat CBC in a.m.  Hypokalemia -Patient's potassium this a.m. was 3.5 -Replete with po KCl 40 mEQ BID  -Mag Level was 2.0 -Continue monitor and trend  Hypophosphatemia  -Patient's phosphorus level is 2.4 -Replete with K-Phos Neutral 500 mg p.o. x1 -Continue monitor and replete as necessary -Repeat phosphorus level in the a.m.  Hyperglycemia in the setting of Pre-Diabetes -Patient's blood sugar on CMP on admission was 93 is now 159 on BMP this morning-likely in the setting of IV steroids demargination-continue monitor and trend carefully -Blood sugars remain consistently elevated will place on considerable sliding scale insulin AC -Check hemoglobin A1c to rule out Diabetes and her HbA1c was 5.8  Leukocytosis -Likely worsened in the setting of IV steroid demargination as patient's pneumonia is improving procalcitonin is trending down -Patient is currently afebrile -Patient's WBC went from 9.1 -> 25.9 -> 24.9 -> 23.2 -IV steroids have been tapered and she is now IV methylprednisolone 40 mg every 12 -Continue monitor for signs and symptoms of infection and she is currently being treated with IV cefepime -Repeat CBC in the a.m.  DVT prophylaxis: Anticoagulated with Apixaban Code Status: FULL CODE  Family Communication: No family present at bedside  Disposition Plan: Remain Inpatient for continued workup and Treatment  Consultants:   Pulmonary Dr. Luan Pulling  Will need TTS Consult to Psych   Procedures:  None   Antimicrobials:  Anti-infectives (From admission, onward)   Start      Dose/Rate Route Frequency Ordered Stop   09/08/18 1400  ceFEPIme (MAXIPIME) 2 g in sodium chloride 0.9 % 100 mL IVPB     2 g 200 mL/hr over 30 Minutes Intravenous Every 8 hours 09/08/18 0849 09/15/18 2159   09/08/18 0600  vancomycin (VANCOCIN) 500 mg in sodium chloride 0.9 % 100 mL IVPB  Status:  Discontinued     500 mg 100 mL/hr over 60 Minutes Intravenous Every 12 hours 09/07/18 2216 09/08/18 1302   09/07/18 2200  ceFEPIme (MAXIPIME) 1 g in sodium chloride 0.9 % 100 mL IVPB  Status:  Discontinued     1 g 200 mL/hr over 30 Minutes Intravenous Every 8 hours 09/07/18 2028 09/08/18 0849   09/07/18 1745  vancomycin (VANCOCIN) IVPB 1000 mg/200 mL premix     1,000 mg 200 mL/hr over 60 Minutes Intravenous  Once 09/07/18 1732 09/07/18 1941   09/07/18 1745  piperacillin-tazobactam (ZOSYN) IVPB 3.375 g     3.375 g 12.5 mL/hr over 240 Minutes Intravenous  Once 09/07/18 1732 09/07/18 2229     Subjective: Seen and examined at bedside and states she had a good night  yesterday but then this morning she states she "messed up" and started coughing and aspirating on her food.  Swallow evaluation was done and speech therapist wanted to pursue an MBS but patient refused.  Patient still complaining of pain and thinks that she is cracked a rib and complaining significantly.  Asking for oxycodone early.  No other concerns or complaints at this time.  Objective: Vitals:   09/11/18 0542 09/11/18 0729 09/11/18 1417 09/11/18 1444  BP: (!) 140/92   (!) 154/95  Pulse: (!) 110   (!) 110  Resp: 18   18  Temp: 97.8 F (36.6 C)   97.6 F (36.4 C)  TempSrc: Oral   Oral  SpO2: 96% 97% 99% 98%  Weight:      Height:        Intake/Output Summary (Last 24 hours) at 09/11/2018 1626 Last data filed at 09/11/2018 1445 Gross per 24 hour  Intake 960 ml  Output 150 ml  Net 810 ml   Filed Weights   09/07/18 1432  Weight: 54.4 kg   Examination: Physical Exam:  Constitutional: Thin Caucasian female who appears older  than her stated age is not hallucinating but is very frustrated and anxious and complaining that she is cracked her rib Eyes: Lids extract are normal.  Sclera anicteric ENMT: External ears and nose appear normal.  Grossly normal hearing Neck: Appears supple no JVD Respiratory: Diminished auscultation bilaterally but rhonchi is improved.  Does not have significant wheezing today.  She is not coughing and she has a slightly increased respiratory rate and is wearing supplemental oxygen via nasal cannula Cardiovascular: Tachycardic rate but regular rhythm.  No appreciable murmurs, rubs, gallops.  Had very mild lower extremity edema noted Abdomen: Soft, nontender to palpate.  Not distended.  Bowel sounds present GU: Deferred Musculoskeletal: No contractures or cyanosis.  No joint deformities in the upper and lower extremities Skin: Skin is warm and dry no appreciable rashes or lesions limited skin evaluation Neurologic: Cranial nerves II through XII grossly intact no appreciable focal deficits noted Psychiatric: Not actively hallucinating now but remains anxious   Data Reviewed: I have personally reviewed following labs and imaging studies  CBC: Recent Labs  Lab 09/07/18 1556 09/08/18 0555 09/09/18 0712 09/10/18 0556 09/11/18 0607  WBC 13.1* 9.1 25.9* 24.9* 23.2*  NEUTROABS 6.6  --  21.1* 21.9* 19.0*  HGB 11.6* 11.3* 11.2* 11.1* 11.1*  HCT 36.5 35.3* 35.2* 34.9* 35.0*  MCV 98.1 96.7 95.4 96.7 97.5  PLT 535* 505* 592* 554* 292*   Basic Metabolic Panel: Recent Labs  Lab 09/07/18 1556 09/08/18 0555 09/09/18 0712 09/10/18 0556 09/11/18 0607  NA 142 137 142 139 141  K 3.0* 3.5 3.3* 2.7* 3.5  CL 106 100 99 100 106  CO2 26 24 28 26 26   GLUCOSE 93 159* 137* 147* 170*  BUN 7 6 11 11 10   CREATININE 0.52 0.53 0.56 0.60 0.53  CALCIUM 8.8* 8.8* 8.9 9.2 9.0  MG  --   --  1.9 2.0 2.0  PHOS  --   --  3.3 2.7 2.4*   GFR: Estimated Creatinine Clearance: 64.2 mL/min (by C-G formula based on  SCr of 0.53 mg/dL). Liver Function Tests: Recent Labs  Lab 09/07/18 1556 09/09/18 0712 09/10/18 0556 09/11/18 0607  AST 21 18 34 22  ALT 10 11 16 16   ALKPHOS 123 104 101 88  BILITOT 0.4 0.3 0.4 0.6  PROT 6.6 6.6 6.8 6.3*  ALBUMIN 2.8* 2.9* 3.1* 2.8*  No results for input(s): LIPASE, AMYLASE in the last 168 hours. No results for input(s): AMMONIA in the last 168 hours. Coagulation Profile: No results for input(s): INR, PROTIME in the last 168 hours. Cardiac Enzymes: Recent Labs  Lab 09/07/18 1556  TROPONINI <0.03   BNP (last 3 results) No results for input(s): PROBNP in the last 8760 hours. HbA1C: Recent Labs    09/10/18 0556  HGBA1C 5.8*   CBG: No results for input(s): GLUCAP in the last 168 hours. Lipid Profile: No results for input(s): CHOL, HDL, LDLCALC, TRIG, CHOLHDL, LDLDIRECT in the last 72 hours. Thyroid Function Tests: No results for input(s): TSH, T4TOTAL, FREET4, T3FREE, THYROIDAB in the last 72 hours. Anemia Panel: Recent Labs    09/10/18 0556  VITAMINB12 1,015*  FOLATE 11.2  FERRITIN 8*  TIBC 316  IRON 27*  RETICCTPCT 1.7   Sepsis Labs: Recent Labs  Lab 09/07/18 1556 09/08/18 0555 09/09/18 0712  PROCALCITON 10.58 5.15 1.93    Recent Results (from the past 240 hour(s))  SARS Coronavirus 2 (CEPHEID- Performed in [facility] hospital lab), Hosp Order     Status: None   Collection Time: 09/07/18  3:56 PM  Result Value Ref Range Status   SARS Coronavirus 2 NEGATIVE NEGATIVE Final    Comment: (NOTE) If result is NEGATIVE SARS-CoV-2 target nucleic acids are NOT DETECTED. The SARS-CoV-2 RNA is generally detectable in upper and lower  respiratory specimens during the acute phase of infection. The lowest  concentration of SARS-CoV-2 viral copies this assay can detect is 250  copies / mL. A negative result does not preclude SARS-CoV-2 infection  and should not be used as the sole basis for treatment or other  patient management decisions.  A  negative result may occur with  improper specimen collection / handling, submission of specimen other  than nasopharyngeal swab, presence of viral mutation(s) within the  areas targeted by this assay, and inadequate number of viral copies  (<250 copies / mL). A negative result must be combined with clinical  observations, patient history, and epidemiological information. If result is POSITIVE SARS-CoV-2 target nucleic acids are DETECTED. The SARS-CoV-2 RNA is generally detectable in upper and lower  respiratory specimens dur ing the acute phase of infection.  Positive  results are indicative of active infection with SARS-CoV-2.  Clinical  correlation with patient history and other diagnostic information is  necessary to determine patient infection status.  Positive results do  not rule out bacterial infection or co-infection with other viruses. If result is PRESUMPTIVE POSTIVE SARS-CoV-2 nucleic acids MAY BE PRESENT.   A presumptive positive result was obtained on the submitted specimen  and confirmed on repeat testing.  While 2019 novel coronavirus  (SARS-CoV-2) nucleic acids may be present in the submitted sample  additional confirmatory testing may be necessary for epidemiological  and / or clinical management purposes  to differentiate between  SARS-CoV-2 and other Sarbecovirus currently known to infect humans.  If clinically indicated additional testing with an alternate test  methodology [id number]) is advised. The SARS-CoV-2 RNA is generally  detectable in upper and lower respiratory sp ecimens during the acute  phase of infection. The expected result is Negative. Fact Sheet for Patients:  StrictlyIdeas.no Fact Sheet for Healthcare Providers: BankingDealers.co.za This test is not yet approved or cleared by the [place] FDA and has been authorized for detection and/or diagnosis of SARS-CoV-2 by FDA under an Emergency Use  Authorization (EUA).  This EUA will remain in effect (meaning this test  can be used) for the duration of the COVID-19 declaration under Section 564(b)(1) of the Act, 21 U.S.C. section 360bbb-3(b)(1), unless the authorization is terminated or revoked sooner. Performed at [facility], [address], [place], [place] [address]   MRSA PCR Screening     Status: None   Collection Time: 09/07/18  6:53 PM  Result Value Ref Range Status   MRSA by PCR NEGATIVE NEGATIVE Final    Comment:        The GeneXpert MRSA Assay (FDA approved for NASAL specimens only), is one component of a comprehensive MRSA colonization surveillance program. It is not intended to diagnose MRSA infection nor to guide or monitor treatment for MRSA infections. Performed at [facility], [address], [place], [place] [address]   Culture, blood (routine x 2) Call MD if unable to obtain prior to antibiotics being given     Status: None (Preliminary result)   Collection Time: 09/07/18  9:22 PM  Result Value Ref Range Status   Specimen Description BLOOD LEFT HAND  Final   Special Requests   Final    BOTTLES DRAWN AEROBIC AND ANAEROBIC Blood Culture adequate volume   Culture   Final    NO GROWTH 4 DAYS Performed at [facility], [address], [place], [place] [address]    Report Status PENDING  Incomplete  Culture, blood (routine x 2) Call MD if unable to obtain prior to antibiotics being given     Status: None (Preliminary result)   Collection Time: 09/07/18  9:34 PM  Result Value Ref Range Status   Specimen Description BLOOD RIGHT ARM  Final   Special Requests   Final    BOTTLES DRAWN AEROBIC AND ANAEROBIC Blood Culture adequate volume   Culture   Final    NO GROWTH 4 DAYS Performed at [facility], [address] [place], [place] [address]    Report Status PENDING  Incomplete  Respiratory Panel by PCR     Status: None   Collection Time: 09/08/18  1:00 PM  Result Value Ref Range Status   Adenovirus NOT  DETECTED NOT DETECTED Final   Coronavirus 229E NOT DETECTED NOT DETECTED Final    Comment: (NOTE) The Coronavirus on the Respiratory Panel, DOES NOT test for the novel  Coronavirus (2019 nCoV)    Coronavirus HKU1 NOT DETECTED NOT DETECTED Final   Coronavirus NL63 NOT DETECTED NOT DETECTED Final   Coronavirus OC43 NOT DETECTED NOT DETECTED Final   Metapneumovirus NOT DETECTED NOT DETECTED Final   Rhinovirus / Enterovirus NOT DETECTED NOT DETECTED Final   Influenza A NOT DETECTED NOT DETECTED Final   Influenza B NOT DETECTED NOT DETECTED Final   Parainfluenza Virus 1 NOT DETECTED NOT DETECTED Final   Parainfluenza Virus 2 NOT DETECTED NOT DETECTED Final   Parainfluenza Virus 3 NOT DETECTED NOT DETECTED Final   Parainfluenza Virus 4 NOT DETECTED NOT DETECTED Final   Respiratory Syncytial Virus NOT DETECTED NOT DETECTED Final   Bordetella pertussis NOT DETECTED NOT DETECTED Final   Chlamydophila pneumoniae NOT DETECTED NOT DETECTED Final   Mycoplasma pneumoniae NOT DETECTED NOT DETECTED Final    Comment: Performed at [facility], [place] [address], [place],  [address]    Radiology Studies: Dg Chest Port 1 View  Result Date: 09/11/2018 CLINICAL DATA:  Cough and shortness of breath EXAM: PORTABLE CHEST 1 VIEW COMPARISON:  09/10/2018 FINDINGS: Cardiac shadow is mildly enlarged but stable in appearance. Diffuse bilateral airspace opacities stable from the previous  exam. No new superimposed abnormality is noted. IMPRESSION: Stable airspace opacities bilaterally. No new focal abnormality is noted. Electronically Signed   By: Inez Catalina M.D.   On: 09/11/2018 08:40   Dg Chest Port 1 View  Result Date: 09/10/2018 CLINICAL DATA:  Cough and congestion EXAM: PORTABLE CHEST 1 VIEW COMPARISON:  09/09/2018 FINDINGS: Cardiac shadow is stable. Diffuse bilateral airspace opacities are again identified stable. No new focal infiltrate is seen. No bony abnormality is noted. IMPRESSION: Bilateral  airspace opacities stable from the previous exam. Electronically Signed   By: Inez Catalina M.D.   On: 09/10/2018 15:45   Scheduled Meds: . apixaban  5 mg Oral BID  . benzonatate  100 mg Oral TID  . diclofenac sodium  2 g Topical QID  . feeding supplement (ENSURE ENLIVE)  237 mL Oral BID BM  . FLUoxetine  40 mg Oral Daily  . fluticasone furoate-vilanterol  1 puff Inhalation Daily   And  . umeclidinium bromide  1 puff Inhalation Daily  . guaiFENesin  1,200 mg Oral BID  . ipratropium-albuterol  3 mL Nebulization Q6H  . lidocaine  1 patch Transdermal Q24H  . methylPREDNISolone (SOLU-MEDROL) injection  40 mg Intravenous Q12H  . pantoprazole  40 mg Oral BID  . pneumococcal 23 valent vaccine  0.5 mL Intramuscular Tomorrow-1000  . potassium chloride  40 mEq Oral BID  . ziprasidone  40 mg Oral BID WC   Continuous Infusions: . ceFEPime (MAXIPIME) IV 2 g (09/11/18 1351)    LOS: 4 days    Kerney Elbe, DO [facility] PAGER is on [place]  If 7PM-7AM, please contact night-coverage [email or web address] Password [email or web address] 09/11/2018, 4:26 PM

## 2018-09-11 NOTE — Progress Notes (Signed)
Pt refusing some medications tonight. Pt refuses IV antibiotic stating "I just had one and I dont want it. I is making me feel funny. My antibiotic doesn't look like that it clear and has mesh on the bottom." Primary nurse attempting to reorient pt but unsuccessful pt continues to refuse. Will continue to monitor and educate

## 2018-09-11 NOTE — Evaluation (Signed)
Clinical/Bedside Swallow Evaluation Patient Details  Name: Christine Ortega MRN: [id number] Date of Birth: 1961/01/13  Today's Date: 09/11/2018 Time: SLP Start Time (ACUTE ONLY): 1340 SLP Stop Time (ACUTE ONLY): 1403 SLP Time Calculation (min) (ACUTE ONLY): 23 min  Past Medical History:  Past Medical History:  Diagnosis Date  . ADD (attention deficit disorder)   . Anxiety   . Bipolar disorder ([place])   . Chronic pain   . Depression   . Narcotic abuse in remission ([place])   . Schizophrenia ([place])   . Seizures ([place])    Past Surgical History:  Past Surgical History:  Procedure Laterality Date  . BACK SURGERY    . ESOPHAGOGASTRODUODENOSCOPY N/A 05/19/2018   Procedure: ESOPHAGOGASTRODUODENOSCOPY (EGD);  Surgeon: Yetta Flock, MD;  Location: [facility] ENDOSCOPY;  Service: Gastroenterology;  Laterality: N/A;  egd at bedside in ICU, intubated  . IR ANGIOGRAM SELECTIVE EACH ADDITIONAL VESSEL  05/20/2018  . IR ANGIOGRAM SELECTIVE EACH ADDITIONAL VESSEL  05/20/2018  . IR ANGIOGRAM VISCERAL SELECTIVE  05/20/2018  . IR ANGIOGRAM VISCERAL SELECTIVE  05/20/2018  . IR EMBO ART  VEN HEMORR LYMPH EXTRAV  INC GUIDE ROADMAPPING  05/20/2018  . IR US GUIDE VASC ACCESS LEFT  05/20/2018   HPI:  She was admitted with acute on chronic hypoxic respiratory failure.  She has severe COPD at baseline and has pulmonary fibrosis.  She has healthcare associated pneumonia which is being treated.  She had been on fairly high-dose steroids and that seemed to cause her to have more problems with her known schizophrenia. BSE requested   Assessment / Plan / Recommendation Clinical Impression  Pt known to me from previous admission and BSE completed. She had a repeat MBSS on June 07, 2018 with recommendation for mechanical soft textures and thin liquids (trace penetration of thins during the swallow without aspiration). Oral motor examination is WNL, Pt with hoarse/harsh vocal quality. She reports that she "aspirated on pancakes"  this morning and that she knows she needs to "slow down". SLP again reviewed relationship between respiration/swallow reciprocity and how impaired respiratory status increases her risk for aspiration. Pt refused graham cracker trials ("I'll choke and die on that!"), but became agitated when SLP suggested that textures be changed to D3/mech soft. She states that she will pick appropriate "regular" textures and avoid dry, crumbly foods. SLP also suggested completing MBSS given continued coughing, however Pt adamantly declined stating she already had it done and knows that she needs to take her time. Recommend continuing diet as ordered with Pt selecting appropriate textures (she did well with tuna sandwich today), avoid talking during meals, reduce rate, and take small bite/sips. SLP will sign off. If MD desires MBSS, please order. Pt in agreement with plan of care.   SLP Visit Diagnosis: Dysphagia, unspecified (R13.10)    Aspiration Risk  Mild aspiration risk;Moderate aspiration risk(coordinating respiration and swallow; impulsivity)    Diet Recommendation Regular;Thin liquid   Liquid Administration via: Cup;Straw Medication Administration: Whole meds with liquid Supervision: Patient able to self feed Compensations: Slow rate;Small sips/bites Postural Changes: Seated upright at 90 degrees;Remain upright for at least 30 minutes after po intake    Other  Recommendations Oral Care Recommendations: Oral care BID;Patient independent with oral care Other Recommendations: Clarify dietary restrictions   Follow up Recommendations None      Frequency and Duration            Prognosis Prognosis for Safe Diet Advancement: Good Barriers to Reach Goals: Behavior Barriers/Prognosis Comment: impulsivity;  non-compliance      Swallow Study   General Date of Onset: 09/07/18 HPI: She was admitted with acute on chronic hypoxic respiratory failure.  She has severe COPD at baseline and has pulmonary  fibrosis.  She has healthcare associated pneumonia which is being treated.  She had been on fairly high-dose steroids and that seemed to cause her to have more problems with her known schizophrenia. BSE requested Type of Study: Bedside Swallow Evaluation Previous Swallow Assessment: BSE in April, MBS in March Diet Prior to this Study: Regular;Thin liquids Temperature Spikes Noted: No Respiratory Status: Nasal cannula History of Recent Intubation: No Behavior/Cognition: Alert;Cooperative;Pleasant mood Oral Cavity Assessment: Within Functional Limits Oral Care Completed by SLP: No Oral Cavity - Dentition: Adequate natural dentition Vision: Functional for self-feeding Self-Feeding Abilities: Able to feed self Patient Positioning: Upright in bed Baseline Vocal Quality: Hoarse Volitional Cough: Strong Volitional Swallow: Able to elicit    Oral/Motor/Sensory Function Overall Oral Motor/Sensory Function: Within functional limits   Ice Chips Ice chips: Within functional limits Presentation: Spoon   Thin Liquid Thin Liquid: Within functional limits Presentation: Cup;Straw;Self Fed    Nectar Thick Nectar Thick Liquid: Not tested   Honey Thick Honey Thick Liquid: Not tested   Puree Puree: Within functional limits Presentation: Self Fed;Spoon   Solid     Solid: Within functional limits Presentation: Self Fed     Thank you,  Genene Churn, [place]  [place] 09/11/2018,2:13 PM

## 2018-09-11 NOTE — Progress Notes (Signed)
Pt has a new order for incentive spirometry. Pt has an IS at bedside already

## 2018-09-11 NOTE — Evaluation (Signed)
Occupational Therapy Evaluation Patient Details Name: Christine Ortega MRN: [id number] DOB: [birth year] Today's Date: 09/11/2018    History of Present Illness Christine Ortega is a 58 y.o. female with a history of seizure disorder, schizophrenia, depression, chronic pain, pulmonary fibrosis, history of DVT on eliquis.  The patient has been admitted twice since January for community-acquired pneumonia respiratory failure.  She feels that she breathes much better when on steroids and her breathing gets worse off steroids.  This last episode started approximately 2 weeks ago with a gradual decline.  She is now dyspneic to just a few steps.  She does have oxygen at home, which she needed to turn up as well.  She has been using her inhalers that she has been prescribed.  White sputum production with no other palliating or provoking factors.   Clinical Impression   Pt agreeable to OT evaluation this am, reporting she is feeling much better and more like herself today. Pt demonstrating poor activity tolerance, requiring set up for ADLs while seated. Per pt this is her baseline, she has everything she needs set up around the couch at home. Family assists with ADLs and meal preparation. Educated pt on energy conservation strategies which she is familiar with. Pt appears to be near her baseline with functional tasks, however will benefit from [facility] evaluation to determine pt functioning in home environment as provide treatment/education as is appropriate. No further acute OT services required at this time.     Follow Up Recommendations  Home health OT;Supervision/Assistance - 24 hour    Equipment Recommendations  None recommended by OT       Precautions / Restrictions Precautions Precautions: Fall Restrictions Weight Bearing Restrictions: No      Mobility Bed Mobility Overal bed mobility: Modified Independent                Transfers Overall transfer level: Modified independent Equipment used:  None                      ADL either performed or assessed with clinical judgement   ADL Overall ADL's : Needs assistance/impaired     Grooming: Wash/dry hands;Oral care;Set up;Sitting Grooming Details (indicate cue type and reason): Pt completed while seated in chair, unable to stand for task due to fatigue and SOB                 Toilet Transfer: Supervision/safety;Stand-pivot Toilet Transfer Details (indicate cue type and reason): simulated with bed to chair transfer                 Vision Baseline Vision/History: No visual deficits Patient Visual Report: No change from baseline Vision Assessment?: No apparent visual deficits            Pertinent Vitals/Pain Pain Assessment: No/denies pain     Hand Dominance Right   Extremity/Trunk Assessment Upper Extremity Assessment Upper Extremity Assessment: Overall WFL for tasks assessed   Lower Extremity Assessment Lower Extremity Assessment: Defer to PT evaluation   Cervical / Trunk Assessment Cervical / Trunk Assessment: Normal   Communication Communication Communication: No difficulties   Cognition Arousal/Alertness: Awake/alert Behavior During Therapy: WFL for tasks assessed/performed;Agitated Overall Cognitive Status: Within Functional Limits for tasks assessed  Home Living Family/patient expects to be discharged to:: Private residence Living Arrangements: Spouse/significant other;Children Available Help at Discharge: Family;Available 24 hours/day Type of Home: House Home Access: Stairs to enter [employer] of Steps: 1 Entrance Stairs-Rails: None Home Layout: Two level;Able to live on main level with bedroom/bathroom Alternate Level Stairs-Number of Steps: Patient does not go upstairs   Bathroom Shower/Tub: Occupational psychologist: Standard     Home Equipment: Cane - single point;Walker - 2  wheels;Walker - 4 wheels;Bedside commode;Shower seat;Wheelchair - manual          Prior Functioning/Environment Level of Independence: Needs assistance  Gait / Transfers Assistance Needed: Household ambulator with RW, home O2 dependent usually 3 LPM ADL's / Homemaking Assistance Needed: family assists with all ADLs due to pt poor activity tolerance            OT Problem List: Cardiopulmonary status limiting activity;Decreased activity tolerance      OT Treatment/Interventions:      OT Goals(Current goals can be found in the care plan section) Acute Rehab OT Goals Patient Stated Goal: return home with family to assist  OT Frequency:      End of Session Equipment Utilized During Treatment: Oxygen  Activity Tolerance: Patient limited by fatigue Patient left: in chair;with call bell/phone within reach  OT Visit Diagnosis: Muscle weakness (generalized) (M62.81)                Time: 7943-2761 OT Time Calculation (min): 20 min Charges:  OT General Charges $OT Visit: 1 Visit OT Evaluation $OT Eval Moderate Complexity: [place], OTR/L  [phone] 09/11/2018, 8:00 AM

## 2018-09-11 NOTE — Progress Notes (Signed)
Subjective: She says she feels fairly well.  She had diarrhea last night.  She is still coughing.  She is still short of breath.  Objective: Vital signs in last 24 hours: Temp:  [97.8 F (36.6 C)-98.2 F (36.8 C)] 97.8 F (36.6 C) (06/09 0542) Pulse Rate:  [104-136] 110 (06/09 0542) Resp:  [16-22] 18 (06/09 0542) BP: (140-178)/(92-121) 140/92 (06/09 0542) SpO2:  [93 %-100 %] 97 % (06/09 0729) Weight change:  Last BM Date: 09/09/18  Intake/Output from previous day: 06/08 0701 - 06/09 0700 In: 1160 [P.O.:960; IV Piggyback:200] Out: 650 [Urine:650]  PHYSICAL EXAM General appearance: alert, cooperative and mild distress Resp: Rales and rhonchi bilaterally Cardio: regular rate and rhythm, S1, S2 normal, no murmur, click, rub or gallop GI: soft, non-tender; bowel sounds normal; no masses,  no organomegaly Extremities: extremities normal, atraumatic, no cyanosis or edema  Lab Results:  Results for orders placed or performed during the hospital encounter of 09/07/18 (from the past 48 hour(s))  CBC with Differential/Platelet     Status: Abnormal   Collection Time: 09/10/18  5:56 AM  Result Value Ref Range   WBC 24.9 (H) 4.0 - 10.5 K/uL   RBC 3.61 (L) 3.87 - 5.11 MIL/uL   Hemoglobin 11.1 (L) 12.0 - 15.0 g/dL   HCT 34.9 (L) 36.0 - 46.0 %   MCV 96.7 80.0 - 100.0 fL   MCH 30.7 26.0 - 34.0 pg   MCHC 31.8 30.0 - 36.0 g/dL   RDW 14.4 11.5 - 15.5 %   Platelets 554 (H) 150 - 400 K/uL   nRBC 0.0 0.0 - 0.2 %   Neutrophils Relative % 88 %   Neutro Abs 21.9 (H) 1.7 - 7.7 K/uL   Lymphocytes Relative 5 %   Lymphs Abs 1.2 0.7 - 4.0 K/uL   Monocytes Relative 6 %   Monocytes Absolute 1.4 (H) 0.1 - 1.0 K/uL   Eosinophils Relative 0 %   Eosinophils Absolute 0.0 0.0 - 0.5 K/uL   Basophils Relative 0 %   Basophils Absolute 0.0 0.0 - 0.1 K/uL   Immature Granulocytes 1 %   Abs Immature Granulocytes 0.28 (H) 0.00 - 0.07 K/uL    Comment: Performed at [facility], [address]  [place], [place] [address]  Comprehensive metabolic panel     Status: Abnormal   Collection Time: 09/10/18  5:56 AM  Result Value Ref Range   Sodium 139 135 - 145 mmol/L   Potassium 2.7 (LL) 3.5 - 5.1 mmol/L    Comment: CRITICAL RESULT CALLED TO, READ BACK BY AND VERIFIED WITH: FOLEY,B AT 7:25AM ON 09/10/18 BY FESTERMAN,C    Chloride 100 98 - 111 mmol/L   CO2 26 22 - 32 mmol/L   Glucose, Bld 147 (H) 70 - 99 mg/dL   BUN 11 6 - 20 mg/dL   Creatinine, Ser 0.60 0.44 - 1.00 mg/dL   Calcium 9.2 8.9 - 10.3 mg/dL   Total Protein 6.8 6.5 - 8.1 g/dL   Albumin 3.1 (L) 3.5 - 5.0 g/dL   AST 34 15 - 41 U/L   ALT 16 0 - 44 U/L   Alkaline Phosphatase 101 38 - 126 U/L   Total Bilirubin 0.4 0.3 - 1.2 mg/dL   GFR calc non Af Amer >60 >60 mL/min   GFR calc Af Amer >60 >60 mL/min   Anion gap 13 5 - 15    Comment: Performed at [facility], [address] [place],  [address]  Magnesium  Status: None   Collection Time: 09/10/18  5:56 AM  Result Value Ref Range   Magnesium 2.0 1.7 - 2.4 mg/dL    Comment: Performed at [facility] [address] [place], [place] [address]  Phosphorus     Status: None   Collection Time: 09/10/18  5:56 AM  Result Value Ref Range   Phosphorus 2.7 2.5 - 4.6 mg/dL    Comment: Performed at [facility], [address], [place], [place] [address]  Vitamin B12     Status: Abnormal   Collection Time: 09/10/18  5:56 AM  Result Value Ref Range   Vitamin B-12 1,015 (H) 180 - 914 pg/mL    Comment: (NOTE) This assay is not validated for testing neonatal or myeloproliferative syndrome specimens for Vitamin B12 levels. Performed at [facility], [address], [place], [place] [address]   Folate     Status: None   Collection Time: 09/10/18  5:56 AM  Result Value Ref Range   Folate 11.2 >5.9 ng/mL    Comment: Performed at [facility], [address], [place], [place] [address]  Iron and TIBC     Status: Abnormal   Collection Time: 09/10/18  5:56 AM  Result Value Ref  Range   Iron 27 (L) 28 - 170 ug/dL   TIBC 316 250 - 450 ug/dL   Saturation Ratios 9 (L) 10.4 - 31.8 %   UIBC 289 ug/dL    Comment: Performed at [facility], [address], [place], [place] [address]  Ferritin     Status: Abnormal   Collection Time: 09/10/18  5:56 AM  Result Value Ref Range   Ferritin 8 (L) 11 - 307 ng/mL    Comment: Performed at [facility], [address] [place], [place] [address]  Reticulocytes     Status: Abnormal   Collection Time: 09/10/18  5:56 AM  Result Value Ref Range   Retic Ct Pct 1.7 0.4 - 3.1 %   RBC. 3.61 (L) 3.87 - 5.11 MIL/uL   Retic Count, Absolute 59.6 19.0 - 186.0 K/uL   Immature Retic Fract 14.2 2.3 - 15.9 %    Comment: Performed at [facility], [address], [place], [place] [address]  Hemoglobin A1c     Status: Abnormal   Collection Time: 09/10/18  5:56 AM  Result Value Ref Range   Hgb A1c MFr Bld 5.8 (H) 4.8 - 5.6 %    Comment: (NOTE) Pre diabetes:          5.7%-6.4% Diabetes:              >6.4% Glycemic control for   <7.0% adults with diabetes    Mean Plasma Glucose 119.76 mg/dL    Comment: Performed at [facility], [place] [address], [place], [place] [address]  CBC with Differential/Platelet     Status: Abnormal   Collection Time: 09/11/18  6:07 AM  Result Value Ref Range   WBC 23.2 (H) 4.0 - 10.5 K/uL   RBC 3.59 (L) 3.87 - 5.11 MIL/uL   Hemoglobin 11.1 (L) 12.0 - 15.0 g/dL   HCT 35.0 (L) 36.0 - 46.0 %   MCV 97.5 80.0 - 100.0 fL   MCH 30.9 26.0 - 34.0 pg   MCHC 31.7 30.0 - 36.0 g/dL   RDW 14.2 11.5 - 15.5 %   Platelets 496 (H) 150 - 400 K/uL   nRBC 0.0 0.0 - 0.2 %   Neutrophils Relative % 82 %   Neutro Abs 19.0 (  H) 1.7 - 7.7 K/uL   Lymphocytes Relative 9 %   Lymphs Abs 2.0 0.7 - 4.0 K/uL   Monocytes Relative 8 %   Monocytes Absolute 1.8 (H) 0.1 - 1.0 K/uL   Eosinophils Relative 0 %   Eosinophils Absolute 0.0 0.0 - 0.5 K/uL   Basophils Relative 0 %   Basophils Absolute 0.0 0.0 - 0.1 K/uL   Immature Granulocytes 1  %   Abs Immature Granulocytes 0.33 (H) 0.00 - 0.07 K/uL    Comment: Performed at [facility], [address] [place], [place] [address]  Comprehensive metabolic panel     Status: Abnormal   Collection Time: 09/11/18  6:07 AM  Result Value Ref Range   Sodium 141 135 - 145 mmol/L   Potassium 3.5 3.5 - 5.1 mmol/L    Comment: DELTA CHECK NOTED   Chloride 106 98 - 111 mmol/L   CO2 26 22 - 32 mmol/L   Glucose, Bld 170 (H) 70 - 99 mg/dL   BUN 10 6 - 20 mg/dL   Creatinine, Ser 0.53 0.44 - 1.00 mg/dL   Calcium 9.0 8.9 - 10.3 mg/dL   Total Protein 6.3 (L) 6.5 - 8.1 g/dL   Albumin 2.8 (L) 3.5 - 5.0 g/dL   AST 22 15 - 41 U/L   ALT 16 0 - 44 U/L   Alkaline Phosphatase 88 38 - 126 U/L   Total Bilirubin 0.6 0.3 - 1.2 mg/dL   GFR calc non Af Amer >60 >60 mL/min   GFR calc Af Amer >60 >60 mL/min   Anion gap 9 5 - 15    Comment: Performed at [facility], [address], [place], [place] [address]  Magnesium     Status: None   Collection Time: 09/11/18  6:07 AM  Result Value Ref Range   Magnesium 2.0 1.7 - 2.4 mg/dL    Comment: Performed at [facility], [address], [place], [place] [address]  Phosphorus     Status: Abnormal   Collection Time: 09/11/18  6:07 AM  Result Value Ref Range   Phosphorus 2.4 (L) 2.5 - 4.6 mg/dL    Comment: Performed at [facility], [address], [place], [place] [address]    ABGS No results for input(s): PHART, PO2ART, TCO2, HCO3 in the last 72 hours.  Invalid input(s): PCO2 CULTURES Recent Results (from the past 240 hour(s))  SARS Coronavirus 2 (CEPHEID- Performed in [place] hospital lab), Hosp Order     Status: None   Collection Time: 09/07/18  3:56 PM  Result Value Ref Range Status   SARS Coronavirus 2 NEGATIVE NEGATIVE Final    Comment: (NOTE) If result is NEGATIVE SARS-CoV-2 target nucleic acids are NOT DETECTED. The SARS-CoV-2 RNA is generally detectable in upper and lower  respiratory specimens during the acute phase of infection. The lowest   concentration of SARS-CoV-2 viral copies this assay can detect is 250  copies / mL. A negative result does not preclude SARS-CoV-2 infection  and should not be used as the sole basis for treatment or other  patient management decisions.  A negative result may occur with  improper specimen collection / handling, submission of specimen other  than nasopharyngeal swab, presence of viral mutation(s) within the  areas targeted by this assay, and inadequate number of viral copies  (<250 copies / mL). A negative result must be combined with clinical  observations, patient history, and epidemiological information. If result is POSITIVE SARS-CoV-2 target nucleic acids are DETECTED. The  SARS-CoV-2 RNA is generally detectable in upper and lower  respiratory specimens dur ing the acute phase of infection.  Positive  results are indicative of active infection with SARS-CoV-2.  Clinical  correlation with patient history and other diagnostic information is  necessary to determine patient infection status.  Positive results do  not rule out bacterial infection or co-infection with other viruses. If result is PRESUMPTIVE POSTIVE SARS-CoV-2 nucleic acids MAY BE PRESENT.   A presumptive positive result was obtained on the submitted specimen  and confirmed on repeat testing.  While 2019 novel coronavirus  (SARS-CoV-2) nucleic acids may be present in the submitted sample  additional confirmatory testing may be necessary for epidemiological  and / or clinical management purposes  to differentiate between  SARS-CoV-2 and other Sarbecovirus currently known to infect humans.  If clinically indicated additional testing with an alternate test  methodology [phone]) is advised. The SARS-CoV-2 RNA is generally  detectable in upper and lower respiratory sp ecimens during the acute  phase of infection. The expected result is Negative. Fact Sheet for Patients:  StrictlyIdeas.no Fact Sheet  for Healthcare Providers: BankingDealers.co.za This test is not yet approved or cleared by the [place] FDA and has been authorized for detection and/or diagnosis of SARS-CoV-2 by FDA under an Emergency Use Authorization (EUA).  This EUA will remain in effect (meaning this test can be used) for the duration of the COVID-19 declaration under Section 564(b)(1) of the Act, 21 U.S.C. section 360bbb-3(b)(1), unless the authorization is terminated or revoked sooner. Performed at [facility], [address] [place], [place] [address]   MRSA PCR Screening     Status: None   Collection Time: 09/07/18  6:53 PM  Result Value Ref Range Status   MRSA by PCR NEGATIVE NEGATIVE Final    Comment:        The GeneXpert MRSA Assay (FDA approved for NASAL specimens only), is one component of a comprehensive MRSA colonization surveillance program. It is not intended to diagnose MRSA infection nor to guide or monitor treatment for MRSA infections. Performed at [facility], [address] [place], [place] [address]   Culture, blood (routine x 2) Call MD if unable to obtain prior to antibiotics being given     Status: None (Preliminary result)   Collection Time: 09/07/18  9:22 PM  Result Value Ref Range Status   Specimen Description BLOOD LEFT HAND  Final   Special Requests   Final    BOTTLES DRAWN AEROBIC AND ANAEROBIC Blood Culture adequate volume   Culture   Final    NO GROWTH 4 DAYS Performed at [facility], [address] [place], [place] [address]    Report Status PENDING  Incomplete  Culture, blood (routine x 2) Call MD if unable to obtain prior to antibiotics being given     Status: None (Preliminary result)   Collection Time: 09/07/18  9:34 PM  Result Value Ref Range Status   Specimen Description BLOOD RIGHT ARM  Final   Special Requests   Final    BOTTLES DRAWN AEROBIC AND ANAEROBIC Blood Culture adequate volume   Culture   Final    NO GROWTH 4  DAYS Performed at [facility], [address] [place], [place] [address]    Report Status PENDING  Incomplete  Respiratory Panel by PCR     Status: None   Collection Time: 09/08/18  1:00 PM  Result Value Ref Range Status   Adenovirus NOT DETECTED NOT DETECTED Final  Coronavirus 229E NOT DETECTED NOT DETECTED Final    Comment: (NOTE) The Coronavirus on the Respiratory Panel, DOES NOT test for the novel  Coronavirus (2019 nCoV)    Coronavirus HKU1 NOT DETECTED NOT DETECTED Final   Coronavirus NL63 NOT DETECTED NOT DETECTED Final   Coronavirus OC43 NOT DETECTED NOT DETECTED Final   Metapneumovirus NOT DETECTED NOT DETECTED Final   Rhinovirus / Enterovirus NOT DETECTED NOT DETECTED Final   Influenza A NOT DETECTED NOT DETECTED Final   Influenza B NOT DETECTED NOT DETECTED Final   Parainfluenza Virus 1 NOT DETECTED NOT DETECTED Final   Parainfluenza Virus 2 NOT DETECTED NOT DETECTED Final   Parainfluenza Virus 3 NOT DETECTED NOT DETECTED Final   Parainfluenza Virus 4 NOT DETECTED NOT DETECTED Final   Respiratory Syncytial Virus NOT DETECTED NOT DETECTED Final   Bordetella pertussis NOT DETECTED NOT DETECTED Final   Chlamydophila pneumoniae NOT DETECTED NOT DETECTED Final   Mycoplasma pneumoniae NOT DETECTED NOT DETECTED Final    Comment: Performed at [facility], [place] [address] [place], [place] [address]   Studies/Results: Dg Chest Port 1 View  Result Date: 09/10/2018 CLINICAL DATA:  Cough and congestion EXAM: PORTABLE CHEST 1 VIEW COMPARISON:  09/09/2018 FINDINGS: Cardiac shadow is stable. Diffuse bilateral airspace opacities are again identified stable. No new focal infiltrate is seen. No bony abnormality is noted. IMPRESSION: Bilateral airspace opacities stable from the previous exam. Electronically Signed   By: Inez Catalina M.D.   On: 09/10/2018 15:45    Medications:  Prior to Admission:  Medications Prior to Admission  Medication Sig Dispense Refill Last Dose  .  acetaminophen (TYLENOL) 500 MG tablet Take 1 tablet (500 mg total) by mouth every 8 (eight) hours as needed for moderate pain. 30 tablet 0 Past Week at Unknown time  . apixaban (ELIQUIS) 5 MG TABS tablet Take 1 tablet (5 mg total) by mouth 2 (two) times daily. 2 pills mouth twice a day till 06/12/2018 then switch to 1 pill twice a day from 06/13/2018 (Patient taking differently: Take 10 mg by mouth 2 (two) times daily. ) 60 tablet 0 09/07/2018 at 800  . cetirizine (ZYRTEC) 10 MG tablet Take 10 mg by mouth daily.   09/07/2018 at Unknown time  . FLUoxetine (PROZAC) 40 MG capsule Take 1 capsule (40 mg total) by mouth daily. For depression and anxiety. 30 capsule 0 09/07/2018 at Unknown time  . Fluticasone-Umeclidin-Vilant (TRELEGY ELLIPTA) 100-62.5-25 MCG/INH AEPB Inhale 1 puff into the lungs daily.   09/06/2018 at Unknown time  . guaiFENesin (MUCINEX) 600 MG 12 hr tablet Take 800 mg by mouth 2 (two) times daily.    09/07/2018 at Unknown time  . Melatonin 5 MG CAPS Take 20 mg by mouth at bedtime.    09/06/2018 at Unknown time  . pantoprazole (PROTONIX) 40 MG tablet Take 1 tablet (40 mg total) by mouth 2 (two) times daily. 60 tablet 0 09/07/2018 at Unknown time  . Probiotic Product (PROBIOTIC PO) Take 1 tablet by mouth daily. Women's probiotic for GI health   09/07/2018 at Unknown time  . lactose free nutrition (BOOST PLUS) LIQD Take 237 mLs by mouth 3 (three) times daily with meals. (Patient not taking: Reported on 07/27/2018) 30 Can 0 Not Taking at Unknown time  . metoprolol tartrate (LOPRESSOR) 25 MG tablet Take 1 tablet (25 mg total) by mouth 2 (two) times daily. (Patient not taking: Reported on 07/27/2018) 60 tablet 0 Not Taking at Unknown time  . ziprasidone (  GEODON) 80 MG capsule Take one capsule at bedtime for depression and mood stability. (Patient not taking: Reported on 07/27/2018) 30 capsule 0 Not Taking at Unknown time   Scheduled: . apixaban  5 mg Oral BID  . benzonatate  100 mg Oral TID  . diclofenac sodium  2  g Topical QID  . feeding supplement (ENSURE ENLIVE)  237 mL Oral BID BM  . FLUoxetine  40 mg Oral Daily  . fluticasone furoate-vilanterol  1 puff Inhalation Daily   And  . umeclidinium bromide  1 puff Inhalation Daily  . guaiFENesin  1,200 mg Oral BID  . ipratropium-albuterol  3 mL Nebulization Q6H  . lidocaine  1 patch Transdermal Q24H  . methylPREDNISolone (SOLU-MEDROL) injection  40 mg Intravenous Q12H  . pantoprazole  40 mg Oral BID  . pneumococcal 23 valent vaccine  0.5 mL Intramuscular Tomorrow-1000  . potassium chloride  40 mEq Oral BID  . ziprasidone  40 mg Oral BID WC   Continuous: . ceFEPime (MAXIPIME) IV 2 g (09/11/18 0553)   JQB:HALPFXTKWIOXB, albuterol, HYDROcodone-homatropine, hydrOXYzine, oxyCODONE  Assesment: She was admitted with acute on chronic hypoxic respiratory failure.  She has severe COPD at baseline and has pulmonary fibrosis.  She has healthcare associated pneumonia which is being treated.  She had been on fairly high-dose steroids and that seemed to cause her to have more problems with her known schizophrenia.  That seems to be better now. Principal Problem:   Acute on chronic respiratory failure with hypoxia (HCC) Active Problems:   Narcotic abuse in remission ([place])   Bipolar disorder (HCC)   Chronic pain   ADD (attention deficit disorder)   Schizophrenia, paranoid type ([place])   Thrombocytosis (HCC)   Hypokalemia   HCAP (healthcare-associated pneumonia)   History of DVT (deep vein thrombosis)   Normocytic anemia   GERD (gastroesophageal reflux disease)   Hyperglycemia   COPD with acute exacerbation (HCC)   Pulmonary fibrosis ([place])    Plan: Continue current treatments    LOS: 4 days   Alonza Bogus 09/11/2018, 8:26 AM

## 2018-09-11 NOTE — TOC Progression Note (Signed)
Transition of Care [facility]) - Progression Note    Patient Details  Name: Christine Ortega MRN: [id number] Date of Birth: 07-22-1960  Transition of [facility] CM/SW Contact  Shade Flood, LCSW Phone Number: 09/11/2018, 10:05 AM  Clinical Narrative:     Pt discussed with MD in Progression this AM. MD plans to consult TTS when pt medically stable to determine if she requires inpatient psychiatric hospitalization at dc. If pt is cleared to return home, will arrange for [facility] PT/OT and SW.   TOC will follow.  Expected Discharge Plan: [place] Barriers to Discharge: Continued Medical Work up, Other (comment)(Needs TTS)  Expected Discharge Plan and Services Expected Discharge Plan: [place]       Living arrangements for the past 2 months: Single Family Home                                       Social Determinants of Health (SDOH) Interventions    Readmission Risk Interventions Readmission Risk Prevention Plan 09/10/2018 07/31/2018  Transportation Screening - Complete  PCP or Specialist Appt within 3-5 Days Not Complete Complete  HRI or Home Care Consult Not Complete Complete  Social Work Consult for [place] Planning/Counseling Not Complete Complete  SW consult not completed comments Pt having Behavioral issues MD will order TTS -  Palliative Care Screening Not Applicable Not Applicable  Medication Review (RN Care Manager) Complete Complete  Some recent data might be hidden

## 2018-09-12 ENCOUNTER — Inpatient Hospital Stay (HOSPITAL_COMMUNITY): Payer: Medicare Other

## 2018-09-12 ENCOUNTER — Encounter (HOSPITAL_COMMUNITY): Payer: Self-pay | Admitting: Family Medicine

## 2018-09-12 LAB — CBC WITH DIFFERENTIAL/PLATELET
Abs Immature Granulocytes: 0.28 10*3/uL — ABNORMAL HIGH (ref 0.00–0.07)
Basophils Absolute: 0 10*3/uL (ref 0.0–0.1)
Basophils Relative: 0 %
Eosinophils Absolute: 0.1 10*3/uL (ref 0.0–0.5)
Eosinophils Relative: 0 %
HCT: 35.7 % — ABNORMAL LOW (ref 36.0–46.0)
Hemoglobin: 11.2 g/dL — ABNORMAL LOW (ref 12.0–15.0)
Immature Granulocytes: 1 %
Lymphocytes Relative: 8 %
Lymphs Abs: 1.8 10*3/uL (ref 0.7–4.0)
MCH: 30.3 pg (ref 26.0–34.0)
MCHC: 31.4 g/dL (ref 30.0–36.0)
MCV: 96.5 fL (ref 80.0–100.0)
Monocytes Absolute: 0.9 10*3/uL (ref 0.1–1.0)
Monocytes Relative: 4 %
Neutro Abs: 19.5 10*3/uL — ABNORMAL HIGH (ref 1.7–7.7)
Neutrophils Relative %: 87 %
Platelets: 542 10*3/uL — ABNORMAL HIGH (ref 150–400)
RBC: 3.7 MIL/uL — ABNORMAL LOW (ref 3.87–5.11)
RDW: 14 % (ref 11.5–15.5)
WBC: 22.5 10*3/uL — ABNORMAL HIGH (ref 4.0–10.5)
nRBC: 0 % (ref 0.0–0.2)

## 2018-09-12 LAB — COMPREHENSIVE METABOLIC PANEL
ALT: 15 U/L (ref 0–44)
AST: 17 U/L (ref 15–41)
Albumin: 2.8 g/dL — ABNORMAL LOW (ref 3.5–5.0)
Alkaline Phosphatase: 92 U/L (ref 38–126)
Anion gap: 10 (ref 5–15)
BUN: 9 mg/dL (ref 6–20)
CO2: 28 mmol/L (ref 22–32)
Calcium: 9 mg/dL (ref 8.9–10.3)
Chloride: 102 mmol/L (ref 98–111)
Creatinine, Ser: 0.46 mg/dL (ref 0.44–1.00)
GFR calc Af Amer: >60 mL/min (ref >60)
GFR calc non Af Amer: >60 mL/min (ref >60)
Glucose, Bld: 125 mg/dL — ABNORMAL HIGH (ref 70–99)
Potassium: 4.1 mmol/L (ref 3.5–5.1)
Sodium: 140 mmol/L (ref 135–145)
Total Bilirubin: 0.5 mg/dL (ref 0.3–1.2)
Total Protein: 6.3 g/dL — ABNORMAL LOW (ref 6.5–8.1)

## 2018-09-12 LAB — CULTURE, BLOOD (ROUTINE X 2)
Culture: NO GROWTH
Culture: NO GROWTH
Special Requests: ADEQUATE
Special Requests: ADEQUATE

## 2018-09-12 LAB — MAGNESIUM: Magnesium: 2 mg/dL (ref 1.7–2.4)

## 2018-09-12 LAB — PHOSPHORUS: Phosphorus: 2.8 mg/dL (ref 2.5–4.6)

## 2018-09-12 MED ORDER — DOXYCYCLINE HYCLATE 100 MG PO TABS
100.0000 mg | ORAL_TABLET | Freq: Two times a day (BID) | ORAL | Status: DC
  Administered 2018-09-12 – 2018-09-13 (×3): 100 mg via ORAL
  Filled 2018-09-12 (×3): qty 1

## 2018-09-12 MED ORDER — PREDNISONE 20 MG PO TABS
40.0000 mg | ORAL_TABLET | Freq: Every day | ORAL | Status: DC
  Administered 2018-09-13: 40 mg via ORAL
  Filled 2018-09-12: qty 2

## 2018-09-12 NOTE — Progress Notes (Signed)
CSW contacted patient's RN, Autumn, and explained that patient would need to be medically cleared in order for her to have a TTS consult.  Explained that medical clearance in this case would mean patient is stable medically, labs and vitals are within normal ranges, patient is able to be safely transported. Medical clearance must be documented in a note by current provider.  Patient's nurse expressed understanding and will talk to current provider.    Areatha Keas. Judi Cong, MSW, [place] Disposition Clinical Social Work [phone] (cell) [phone] (office)

## 2018-09-12 NOTE — Progress Notes (Signed)
Patient ID: Christine Ortega, female   DOB: [birth year], 58 y.o.   MRN: [id number]  Reassessment   Patient is a 58 year old female who initially presented to [facility] ED for complaints of cough, difficulty breathing, and burning in her chest.  She was diagnosed with pneumonia and treated as per MD recommendation. At some point, there were some concerns that patient was hallucinating. So request for psychiatric consultation was made. During this evaluation, patient was alert and oriented x3, calm and cooperative. She stated that she lived with her daughter, husband and son, and provided the ages of all. She denies any auditory or visual hallucinations along with any other psychotic symptoms. She did not appear internally preoccupied. She denied feeling homicidal or suicidal. She reported overall, she was feeling much better. At this time, there is no evidence of imminent risk to self or others at present. She is being psychiatrically cleared. She does have a psychiatric history of bipolar disorder, depression, and schizophrenia (per chart review) and is on some psychotropic medications. It is reccommended that patient continue follow-up with her psychiatric outpatient team..

## 2018-09-12 NOTE — Progress Notes (Signed)
PROGRESS NOTE    Christine Ortega  POE:[id number]  DOB: 09-28-1960  DOA: 09/07/2018 PCP: Lucia Gaskins, MD   Brief Admission Hx: 58 year old female with schizophrenia, depression, chronic pain, opioid dependent, pulmonary fibrosis, epilepsy and history of DVT fully anticoagulated with apixaban presented with shortness of breath and acute on chronic respiratory failure.  MDM/Assessment & Plan:   1. Acute on chronic respiratory failure with hypoxia-secondary to COPD, pulmonary fibrosis and H CAP-continue treatments as she is improving.  Wean oxygen as tolerated.  Have seen the patient today and she appears to be at her baseline respiratory status.  Patient is medically stable for further behavioral health care.  I have asked for a TTS consultation. 2. HCAP -as she is clinically improved will de-escalate antibiotic coverage.  Start doxycycline 100 mg twice daily.   3. COPD exacerbation - clinically improved to baseline on current therapy.  Continue for now.   4. History of DVT - continue apixaban 5 mg BID.  5. Pulmonary fibrosis - Pt has been seen and followed by pulmonologist.  She seems to be at her baseline respiratory status.   6. Bipolar disorder / paranoid schizophrenia - Pt has been refusing geodon treatments and has had active psychosis.  Pt is medically stable now and can be evaluated by TTS services.  Pt does appear to need acute psychiatric treatment.  7. History of opioid abuse - avoiding IV opioids.  8. Hypokalemia - repleted.  9. Hypophosphatemia - repleted. 10. Normocytic anemia - Hg holding stable. 11. Reactive thrombocytosis - stable.  12. GERD - continue PPI therapy.     DVT prophylaxis: apixaban Code Status: Full  Family Communication: Spouse updated telephone Disposition Plan: pt is medically cleared for psychiatric evaluation, pt is medically stable for transfer at any time for behavioral health treatments.   Consultants:  pulmonology  Procedures:     Antimicrobials:  Cefepime >>   Subjective: Pt says that her breathing is much better.  She denies chest pain and denies SOB.  Pt is hallucinating and talking to self at times.  Pt is refusing care and treatments at times.   Objective: Vitals:   09/11/18 1444 09/11/18 2105 09/12/18 0608 09/12/18 0724  BP: (!) 154/95 (!) 156/113 (!) 153/104   Pulse: (!) 110 (!) 125 96   Resp: 18 17 18    Temp: 97.6 F (36.4 C) 98.7 F (37.1 C) 98.2 F (36.8 C)   TempSrc: Oral Oral Oral   SpO2: 98% 95% 99% 98%  Weight:      Height:        Intake/Output Summary (Last 24 hours) at 09/12/2018 1313 Last data filed at 09/12/2018 1228 Gross per 24 hour  Intake 1200 ml  Output 200 ml  Net 1000 ml   Filed Weights   09/07/18 1432  Weight: 54.4 kg     REVIEW OF SYSTEMS  As per history otherwise all reviewed and reported negative  Exam:  General exam: pt is hallucinating and talking to self, appears in no distress.  Respiratory system: bibasilar wheezes, No increased work of breathing. Cardiovascular system: S1 & S2 heard. No JVD, murmurs, gallops, clicks or pedal edema. Gastrointestinal system: Abdomen is nondistended, soft and nontender. Normal bowel sounds heard. Central nervous system: Pt alert, remains disoriented with active psychosis. No focal neurological deficits. Extremities: no CCE.  Data Reviewed: Basic Metabolic Panel: Recent Labs  Lab 09/08/18 0555 09/09/18 0712 09/10/18 0556 09/11/18 0607 09/12/18 0504  NA 137 142 139 141 140  K 3.5  3.3* 2.7* 3.5 4.1  CL 100 99 100 106 102  CO2 24 28 26 26 28   GLUCOSE 159* 137* 147* 170* 125*  BUN 6 11 11 10 9   CREATININE 0.53 0.56 0.60 0.53 0.46  CALCIUM 8.8* 8.9 9.2 9.0 9.0  MG  --  1.9 2.0 2.0 2.0  PHOS  --  3.3 2.7 2.4* 2.8   Liver Function Tests: Recent Labs  Lab 09/07/18 1556 09/09/18 0712 09/10/18 0556 09/11/18 0607 09/12/18 0504  AST 21 18 34 22 17  ALT 10 11 16 16 15   ALKPHOS 123 104 101 88 92  BILITOT 0.4 0.3  0.4 0.6 0.5  PROT 6.6 6.6 6.8 6.3* 6.3*  ALBUMIN 2.8* 2.9* 3.1* 2.8* 2.8*   No results for input(s): LIPASE, AMYLASE in the last 168 hours. No results for input(s): AMMONIA in the last 168 hours. CBC: Recent Labs  Lab 09/07/18 1556 09/08/18 0555 09/09/18 0712 09/10/18 0556 09/11/18 0607 09/12/18 0504  WBC 13.1* 9.1 25.9* 24.9* 23.2* 22.5*  NEUTROABS 6.6  --  21.1* 21.9* 19.0* 19.5*  HGB 11.6* 11.3* 11.2* 11.1* 11.1* 11.2*  HCT 36.5 35.3* 35.2* 34.9* 35.0* 35.7*  MCV 98.1 96.7 95.4 96.7 97.5 96.5  PLT 535* 505* 592* 554* 496* 542*   Cardiac Enzymes: Recent Labs  Lab 09/07/18 1556  TROPONINI <0.03   CBG (last 3)  No results for input(s): GLUCAP in the last 72 hours. Recent Results (from the past 240 hour(s))  SARS Coronavirus 2 (CEPHEID- Performed in [place] hospital lab), Hosp Order     Status: None   Collection Time: 09/07/18  3:56 PM  Result Value Ref Range Status   SARS Coronavirus 2 NEGATIVE NEGATIVE Final    Comment: (NOTE) If result is NEGATIVE SARS-CoV-2 target nucleic acids are NOT DETECTED. The SARS-CoV-2 RNA is generally detectable in upper and lower  respiratory specimens during the acute phase of infection. The lowest  concentration of SARS-CoV-2 viral copies this assay can detect is 250  copies / mL. A negative result does not preclude SARS-CoV-2 infection  and should not be used as the sole basis for treatment or other  patient management decisions.  A negative result may occur with  improper specimen collection / handling, submission of specimen other  than nasopharyngeal swab, presence of viral mutation(s) within the  areas targeted by this assay, and inadequate number of viral copies  (<250 copies / mL). A negative result must be combined with clinical  observations, patient history, and epidemiological information. If result is POSITIVE SARS-CoV-2 target nucleic acids are DETECTED. The SARS-CoV-2 RNA is generally detectable in upper and lower   respiratory specimens dur ing the acute phase of infection.  Positive  results are indicative of active infection with SARS-CoV-2.  Clinical  correlation with patient history and other diagnostic information is  necessary to determine patient infection status.  Positive results do  not rule out bacterial infection or co-infection with other viruses. If result is PRESUMPTIVE POSTIVE SARS-CoV-2 nucleic acids MAY BE PRESENT.   A presumptive positive result was obtained on the submitted specimen  and confirmed on repeat testing.  While 2019 novel coronavirus  (SARS-CoV-2) nucleic acids may be present in the submitted sample  additional confirmatory testing may be necessary for epidemiological  and / or clinical management purposes  to differentiate between  SARS-CoV-2 and other Sarbecovirus currently known to infect humans.  If clinically indicated additional testing with an alternate test  methodology [phone]) is advised. The SARS-CoV-2 RNA  is generally  detectable in upper and lower respiratory sp ecimens during the acute  phase of infection. The expected result is Negative. Fact Sheet for Patients:  StrictlyIdeas.no Fact Sheet for Healthcare Providers: BankingDealers.co.za This test is not yet approved or cleared by the [place] FDA and has been authorized for detection and/or diagnosis of SARS-CoV-2 by FDA under an Emergency Use Authorization (EUA).  This EUA will remain in effect (meaning this test can be used) for the duration of the COVID-19 declaration under Section 564(b)(1) of the Act, 21 U.S.C. section 360bbb-3(b)(1), unless the authorization is terminated or revoked sooner. Performed at [facility], [address], [place], [place] [address]   MRSA PCR Screening     Status: None   Collection Time: 09/07/18  6:53 PM  Result Value Ref Range Status   MRSA by PCR NEGATIVE NEGATIVE Final    Comment:        The GeneXpert MRSA  Assay (FDA approved for NASAL specimens only), is one component of a comprehensive MRSA colonization surveillance program. It is not intended to diagnose MRSA infection nor to guide or monitor treatment for MRSA infections. Performed at [facility], [address] [place], [place] [address]   Culture, blood (routine x 2) Call MD if unable to obtain prior to antibiotics being given     Status: None   Collection Time: 09/07/18  9:22 PM  Result Value Ref Range Status   Specimen Description BLOOD LEFT HAND  Final   Special Requests   Final    BOTTLES DRAWN AEROBIC AND ANAEROBIC Blood Culture adequate volume   Culture   Final    NO GROWTH 5 DAYS Performed at [facility], [address], [place], [place] [address]    Report Status 09/12/2018 FINAL  Final  Culture, blood (routine x 2) Call MD if unable to obtain prior to antibiotics being given     Status: None   Collection Time: 09/07/18  9:34 PM  Result Value Ref Range Status   Specimen Description BLOOD RIGHT ARM  Final   Special Requests   Final    BOTTLES DRAWN AEROBIC AND ANAEROBIC Blood Culture adequate volume   Culture   Final    NO GROWTH 5 DAYS Performed at [facility], [address], [place], [place] [address]    Report Status 09/12/2018 FINAL  Final  Respiratory Panel by PCR     Status: None   Collection Time: 09/08/18  1:00 PM  Result Value Ref Range Status   Adenovirus NOT DETECTED NOT DETECTED Final   Coronavirus 229E NOT DETECTED NOT DETECTED Final    Comment: (NOTE) The Coronavirus on the Respiratory Panel, DOES NOT test for the novel  Coronavirus (2019 nCoV)    Coronavirus HKU1 NOT DETECTED NOT DETECTED Final   Coronavirus NL63 NOT DETECTED NOT DETECTED Final   Coronavirus OC43 NOT DETECTED NOT DETECTED Final   Metapneumovirus NOT DETECTED NOT DETECTED Final   Rhinovirus / Enterovirus NOT DETECTED NOT DETECTED Final   Influenza A NOT DETECTED NOT DETECTED Final   Influenza B NOT DETECTED NOT DETECTED Final    Parainfluenza Virus 1 NOT DETECTED NOT DETECTED Final   Parainfluenza Virus 2 NOT DETECTED NOT DETECTED Final   Parainfluenza Virus 3 NOT DETECTED NOT DETECTED Final   Parainfluenza Virus 4 NOT DETECTED NOT DETECTED Final   Respiratory Syncytial Virus NOT DETECTED NOT DETECTED Final   Bordetella pertussis NOT DETECTED NOT DETECTED Final   Chlamydophila pneumoniae NOT DETECTED NOT DETECTED  Final   Mycoplasma pneumoniae NOT DETECTED NOT DETECTED Final    Comment: Performed at [facility], [place] [address], [place], [place] [address]     Studies: Dg Chest Port 1 View  Result Date: 09/11/2018 CLINICAL DATA:  Cough and shortness of breath EXAM: PORTABLE CHEST 1 VIEW COMPARISON:  09/10/2018 FINDINGS: Cardiac shadow is mildly enlarged but stable in appearance. Diffuse bilateral airspace opacities stable from the previous exam. No new superimposed abnormality is noted. IMPRESSION: Stable airspace opacities bilaterally. No new focal abnormality is noted. Electronically Signed   By: Inez Catalina M.D.   On: 09/11/2018 08:40   Dg Chest Port 1 View  Result Date: 09/10/2018 CLINICAL DATA:  Cough and congestion EXAM: PORTABLE CHEST 1 VIEW COMPARISON:  09/09/2018 FINDINGS: Cardiac shadow is stable. Diffuse bilateral airspace opacities are again identified stable. No new focal infiltrate is seen. No bony abnormality is noted. IMPRESSION: Bilateral airspace opacities stable from the previous exam. Electronically Signed   By: Inez Catalina M.D.   On: 09/10/2018 15:45     Scheduled Meds: . apixaban  5 mg Oral BID  . benzonatate  100 mg Oral TID  . diclofenac sodium  2 g Topical QID  . feeding supplement (ENSURE ENLIVE)  237 mL Oral BID BM  . FLUoxetine  40 mg Oral Daily  . fluticasone furoate-vilanterol  1 puff Inhalation Daily   And  . umeclidinium bromide  1 puff Inhalation Daily  . guaiFENesin  1,200 mg Oral BID  . ipratropium-albuterol  3 mL Nebulization TID  . lidocaine  1 patch Transdermal  Q24H  . methylPREDNISolone (SOLU-MEDROL) injection  40 mg Intravenous Q12H  . pantoprazole  40 mg Oral BID  . pneumococcal 23 valent vaccine  0.5 mL Intramuscular Tomorrow-1000  . ziprasidone  40 mg Oral BID WC   Continuous Infusions: . ceFEPime (MAXIPIME) IV 2 g (09/12/18 0533)    Principal Problem:   Acute on chronic respiratory failure with hypoxia (HCC) Active Problems:   Narcotic abuse in remission ([place])   Bipolar disorder (HCC)   Chronic pain   ADD (attention deficit disorder)   Schizophrenia, paranoid type (HCC)   Thrombocytosis (HCC)   Hypokalemia   HCAP (healthcare-associated pneumonia)   History of DVT (deep vein thrombosis)   Normocytic anemia   GERD (gastroesophageal reflux disease)   Hyperglycemia   COPD with acute exacerbation ([place])   Pulmonary fibrosis ([place])  Time spent:   Irwin Brakeman, MD [facility] 09/12/2018, 1:13 PM    LOS: 5 days  How to contact the [facility] Attending or Consulting provider [place] or covering provider during after hours [place], for this patient?  1. Check the care team in [facility] and look for a) attending/consulting TRH provider listed and b) the [facility] team listed 2. Log into [email or web address] and use [place]'s universal password to access. If you do not have the password, please contact the hospital operator. 3. Locate the [facility] provider you are looking for under [facility] and page to a number that you can be directly reached. 4. If you still have difficulty reaching the provider, please page the [facility] (Director on Call) for the Hospitalists listed on amion for assistance.

## 2018-09-12 NOTE — Progress Notes (Signed)
Per TTS NP patient cleared and should follow up on an outpatient basis with psychiatry.

## 2018-09-12 NOTE — Progress Notes (Signed)
Physical Therapy Treatment Patient Details Name: Christine Ortega MRN: [id number] DOB: 1961-03-09 Today's Date: 09/12/2018    History of Present Illness Christine Ortega is a 58 y.o. female with a history of seizure disorder, schizophrenia, depression, chronic pain, pulmonary fibrosis, history of DVT on eliquis.  The patient has been admitted twice since January for community-acquired pneumonia respiratory failure.  She feels that she breathes much better when on steroids and her breathing gets worse off steroids.  This last episode started approximately 2 weeks ago with a gradual decline.  She is now dyspneic to just a few steps.  She does have oxygen at home, which she needed to turn up as well.  She has been using her inhalers that she has been prescribed.  White sputum production with no other palliating or provoking factors.    PT Comments    Pt very irritated, only wanted assistance to [facility]. Refused to let therapist place gait belt on her despite explanation of safety reasons.  Pt refused any additional therapy following transfer back to bed.  Pt only required supervision with transfers.  No evidence of instability or loss of balance.     Follow Up Recommendations        Equipment Recommendations       Recommendations for Other Services       Precautions / Restrictions      Mobility  Bed Mobility Overal bed mobility: Modified Independent             General bed mobility comments: increased time  Transfers Overall transfer level: Modified independent Equipment used: None             General transfer comment: increased time. would not allow therapist to use gait belt  Ambulation/Gait                 Stairs             Wheelchair Mobility    Modified Rankin (Stroke Patients Only)       Balance                                            Cognition Arousal/Alertness: Awake/alert Behavior During Therapy: Agitated Overall  Cognitive Status: History of cognitive impairments - at baseline                                        Exercises      General Comments        Pertinent Vitals/Pain Pain Assessment: No/denies pain    Home Living                      Prior Function            PT Goals (current goals can now be found in the care plan section) Progress towards PT goals: Not progressing toward goals - comment(too agitated to follow instructions)    Frequency           PT Plan      Co-evaluation              AM-PAC PT "6 Clicks" Mobility   Outcome Measure  Help needed turning from your back to your side while in a flat bed without using bedrails?: None Help needed  moving from lying on your back to sitting on the side of a flat bed without using bedrails?: None Help needed moving to and from a bed to a chair (including a wheelchair)?: None Help needed standing up from a chair using your arms (e.g., wheelchair or bedside chair)?: None Help needed to walk in hospital room?: A Little Help needed climbing 3-5 steps with a railing? : A Little 6 Click Score: 22    End of Session   Activity Tolerance: Treatment limited secondary to agitation Patient left: in bed   PT Visit Diagnosis: Unsteadiness on feet (R26.81);Other abnormalities of gait and mobility (R26.89);Muscle weakness (generalized) (M62.81)     Time: 0712-1975 PT Time Calculation (min) (ACUTE ONLY): 8 min  Charges:  $Therapeutic Activity: 8-22 mins                     Teena Irani, PTA/CLT [place], Amy B 09/12/2018, 2:50 PM

## 2018-09-12 NOTE — BH Assessment (Addendum)
Tele Assessment Note   Patient Name: Christine Ortega MRN: [id number] Referring Physician: MD Ronney Lion of Patient: [facility] Location of Provider: Behavioral Health TTS Department  Christine Ortega is an 58 y.o. female.  The pt came in due to medical reasons (pneumonia).  It's reported that the pt is having hallucinations, while at the hospital.  The pt denies having hallucinations.  Past records show that the pt has a history of having hallucinations.  The pt stopped taking Geodon about 5 months ago.  She stated her MD told her to stop taking Geodon and Depakote.  The pt continues to refuse the Geodon.  The pt denies SI and HI.  She is going to [facility] and last went 4 months ago for medication management.  She was inpatient in 2013 for SA.  The pt lives with her husband, sin and daughter.  She denies self harm, legal issues, and history of abuse.  The pt stated she isn't sleeping or eating well.  She denies current SA.  Pt is dressed in scrubs. She is alert and oriented x4. Pt speaks in a clear tone, at moderate volume and normal pace. Eye contact is good. Pt's mood is pleasant. Thought process is coherent and relevant. Pt was cooperative throughout assessment.       Diagnosis:  F31.0 Bipolar I disorder, Current or most recent episode hypomanic, by history  Past Medical History:  Past Medical History:  Diagnosis Date  . ADD (attention deficit disorder)   . Anxiety   . Bipolar disorder ([place])   . Chronic pain   . Depression   . Narcotic abuse in remission ([place])   . Schizophrenia ([place])   . Seizures ([place])     Past Surgical History:  Procedure Laterality Date  . BACK SURGERY    . ESOPHAGOGASTRODUODENOSCOPY N/A 05/19/2018   Procedure: ESOPHAGOGASTRODUODENOSCOPY (EGD);  Surgeon: Yetta Flock, MD;  Location: [facility] ENDOSCOPY;  Service: Gastroenterology;  Laterality: N/A;  egd at bedside in ICU, intubated  . IR ANGIOGRAM SELECTIVE EACH ADDITIONAL VESSEL  05/20/2018  . IR ANGIOGRAM  SELECTIVE EACH ADDITIONAL VESSEL  05/20/2018  . IR ANGIOGRAM VISCERAL SELECTIVE  05/20/2018  . IR ANGIOGRAM VISCERAL SELECTIVE  05/20/2018  . IR EMBO ART  VEN HEMORR LYMPH EXTRAV  INC GUIDE ROADMAPPING  05/20/2018  . IR US GUIDE VASC ACCESS LEFT  05/20/2018    Family History: History reviewed. No pertinent family history.  Social History:  reports that she has quit smoking. She smoked 0.50 packs per day. She has never used smokeless tobacco. She reports that she does not drink alcohol or use drugs.  Additional Social History:  Alcohol / Drug Use Pain Medications: See MAR Prescriptions: See MAR Over the Counter: See MAR History of alcohol / drug use?: No history of alcohol / drug abuse Longest period of sobriety (when/how long): NA  CIWA: CIWA-Ar BP: (!) 141/74 Pulse Rate: (!) 111 COWS:    Allergies:  Allergies  Allergen Reactions  . Nsaids Other (See Comments)    Causes GI bleeding  . Tramadol Hcl     Per Daughter- patient has " sleep paralysis"     Home Medications:  Medications Prior to Admission  Medication Sig Dispense Refill  . acetaminophen (TYLENOL) 500 MG tablet Take 1 tablet (500 mg total) by mouth every 8 (eight) hours as needed for moderate pain. 30 tablet 0  . apixaban (ELIQUIS) 5 MG TABS tablet Take 1 tablet (5 mg total) by mouth 2 (two) times  daily. 2 pills mouth twice a day till 06/12/2018 then switch to 1 pill twice a day from 06/13/2018 (Patient taking differently: Take 10 mg by mouth 2 (two) times daily. ) 60 tablet 0  . cetirizine (ZYRTEC) 10 MG tablet Take 10 mg by mouth daily.    Marland Kitchen FLUoxetine (PROZAC) 40 MG capsule Take 1 capsule (40 mg total) by mouth daily. For depression and anxiety. 30 capsule 0  . Fluticasone-Umeclidin-Vilant (TRELEGY ELLIPTA) 100-62.5-25 MCG/INH AEPB Inhale 1 puff into the lungs daily.    Marland Kitchen guaiFENesin (MUCINEX) 600 MG 12 hr tablet Take 800 mg by mouth 2 (two) times daily.     . Melatonin 5 MG CAPS Take 20 mg by mouth at bedtime.     .  pantoprazole (PROTONIX) 40 MG tablet Take 1 tablet (40 mg total) by mouth 2 (two) times daily. 60 tablet 0  . Probiotic Product (PROBIOTIC PO) Take 1 tablet by mouth daily. Women's probiotic for GI health    . lactose free nutrition (BOOST PLUS) LIQD Take 237 mLs by mouth 3 (three) times daily with meals. (Patient not taking: Reported on 07/27/2018) 30 Can 0  . metoprolol tartrate (LOPRESSOR) 25 MG tablet Take 1 tablet (25 mg total) by mouth 2 (two) times daily. (Patient not taking: Reported on 07/27/2018) 60 tablet 0  . ziprasidone (GEODON) 80 MG capsule Take one capsule at bedtime for depression and mood stability. (Patient not taking: Reported on 07/27/2018) 30 capsule 0    OB/GYN Status:  No LMP recorded. Patient is postmenopausal.  General Assessment Data Location of Assessment: [facility] TTS Assessment: In system Is this a Tele or Face-to-Face Assessment?: Tele Assessment Is this an Initial Assessment or a Re-assessment for this encounter?: Initial Assessment Patient Accompanied by:: N/A Language Other than English: No Living Arrangements: Other (Comment)(home) What gender do you identify as?: Female Marital status: Married Prospect name: [name] Pregnancy Status: No Living Arrangements: Spouse/significant other, Children Can pt return to current living arrangement?: Yes Admission Status: Voluntary Is patient capable of signing voluntary admission?: Yes Referral Source: Self/Family/Friend Insurance type: [employer]     Crisis Care Plan Living Arrangements: Spouse/significant other, Children Legal Guardian: Other:(Self) Name of Psychiatrist: [facility] Name of Therapist: none  Education Status Is patient currently in school?: No Is the patient employed, unemployed or receiving disability?: Unemployed  Risk to self with the past 6 months Suicidal Ideation: No Has patient been a risk to self within the past 6 months prior to admission? : No Suicidal Intent: No Has patient had  any suicidal intent within the past 6 months prior to admission? : No Is patient at risk for suicide?: No Suicidal Plan?: No Has patient had any suicidal plan within the past 6 months prior to admission? : No Access to Means: No What has been your use of drugs/alcohol within the last 12 months?: none Previous Attempts/Gestures: No How many times?: 0 Other Self Harm Risks: none Triggers for Past Attempts: None known Intentional Self Injurious Behavior: None Family Suicide History: No Recent stressful life event(s): Other (Comment)(nonr) Persecutory voices/beliefs?: No Depression: No Depression Symptoms: Insomnia Substance abuse history and/or treatment for substance abuse?: No Suicide prevention information given to non-admitted patients: Not applicable  Risk to Others within the past 6 months Homicidal Ideation: No Does patient have any lifetime risk of violence toward others beyond the six months prior to admission? : No Thoughts of Harm to Others: No Current Homicidal Intent: No Current Homicidal Plan: No Access to Homicidal Means:  No Identified Victim: pt denies History of harm to others?: No Assessment of Violence: None Noted Violent Behavior Description: pt denies Does patient have access to weapons?: No Criminal Charges Pending?: No Does patient have a court date: No Is patient on probation?: No  Psychosis Hallucinations: Auditory(per RN and MD) Delusions: None noted  Mental Status Report Appearance/Hygiene: Unremarkable Eye Contact: Good Motor Activity: Freedom of movement, Unremarkable Speech: Logical/coherent Level of Consciousness: Alert Mood: Pleasant Affect: Appropriate to circumstance Anxiety Level: None Thought Processes: Coherent, Relevant Judgement: Partial Orientation: Person, Place, Time, Situation Obsessive Compulsive Thoughts/Behaviors: None  Cognitive Functioning Concentration: Normal Memory: Recent Intact, Remote Intact Is patient IDD:  No Insight: Fair Impulse Control: Fair Appetite: Fair Have you had any weight changes? : No Change Sleep: Decreased Total Hours of Sleep: 4 Vegetative Symptoms: None  ADLScreening [facility] Assessment Services) Patient's cognitive ability adequate to safely complete daily activities?: Yes Patient able to express need for assistance with ADLs?: Yes Independently performs ADLs?: Yes (appropriate for developmental age)  Prior Inpatient Therapy Prior Inpatient Therapy: Yes Prior Therapy Dates: 2013 Prior Therapy Facilty/Provider(s): [facility] Reason for Treatment: SA  Prior Outpatient Therapy Prior Outpatient Therapy: Yes Prior Therapy Dates: current Prior Therapy Facilty/Provider(s): [facility] Reason for Treatment: bipolar Does patient have an ACCT team?: No Does patient have Intensive In-House Services?  : No Does patient have [facility] services? : No Does patient have P4CC services?: No  ADL Screening (condition at time of admission) Patient's cognitive ability adequate to safely complete daily activities?: Yes Is the patient deaf or have difficulty hearing?: No Does the patient have difficulty seeing, even when wearing glasses/contacts?: No Does the patient have difficulty concentrating, remembering, or making decisions?: No Patient able to express need for assistance with ADLs?: Yes Does the patient have difficulty dressing or bathing?: No Independently performs ADLs?: Yes (appropriate for developmental age) Does the patient have difficulty walking or climbing stairs?: No Weakness of Legs: Both Weakness of Arms/Hands: None  Home Assistive Devices/Equipment Home Assistive Devices/Equipment: Environmental consultant (specify type), Oxygen, Nebulizer  Therapy Consults (therapy consults require a physician order) PT Evaluation Needed: No OT Evalulation Needed: No SLP Evaluation Needed: No Abuse/Neglect Assessment (Assessment to be complete while patient is alone) Abuse/Neglect Assessment Can Be  Completed: Yes Physical Abuse: Denies Verbal Abuse: Denies Sexual Abuse: Denies Exploitation of patient/patient's resources: Denies Self-Neglect: Denies Values / Beliefs Cultural Requests During Hospitalization: None Spiritual Requests During Hospitalization: None Consults Spiritual Care Consult Needed: No Social Work Consult Needed: No Regulatory affairs officer (For Healthcare) Does Patient Have a Medical Advance Directive?: No Would patient like information on creating a medical advance directive?: No - Patient declined Nutrition Screen- [facility] Adult/[facility] Patient's home diet: Regular Has the patient recently lost weight without trying?: No Has the patient been eating poorly because of a decreased appetite?: No Malnutrition Screening Tool Score: 0        Disposition:  Disposition Initial Assessment Completed for this Encounter: Yes NP L. Marcello Moores and S. Rankin recommend the pt discharge and follow up with OPT.  RN Autumn was made aware of the recommendation.  This service was provided via telemedicine using a 2-way, interactive audio and video technology.  Names of all persons participating in this telemedicine service and their role in this encounter. Name: Yamaira Spinner Role: Pt  Name:  Role:   Name:  Role:   Name:  Role:     Enzo Montgomery 09/12/2018 4:55 PM

## 2018-09-12 NOTE — Progress Notes (Signed)
Subjective: She says she feels better this morning.  No new complaints.  She still has some confusion at night and probably some hallucinations that she denies.  She had speech evaluation and was felt to have mild to moderate aspiration risk. Objective: Vital signs in last 24 hours: Temp:  [97.6 F (36.4 C)-98.7 F (37.1 C)] 98.2 F (36.8 C) (06/10 1638) Pulse Rate:  [96-125] 96 (06/10 0608) Resp:  [17-18] 18 (06/10 4536) BP: (153-156)/(95-113) 153/104 (06/10 0608) SpO2:  [95 %-99 %] 98 % (06/10 0724) Weight change:  Last BM Date: 09/11/18  Intake/Output from previous day: 06/09 0701 - 06/10 0700 In: 1200 [P.O.:1200] Out: 200 [Urine:200]  PHYSICAL EXAM General appearance: alert, cooperative and no distress Resp: rhonchi bilaterally Cardio: regular rate and rhythm, S1, S2 normal, no murmur, click, rub or gallop GI: soft, non-tender; bowel sounds normal; no masses,  no organomegaly Extremities: extremities normal, atraumatic, no cyanosis or edema  Lab Results:  Results for orders placed or performed during the hospital encounter of 09/07/18 (from the past 48 hour(s))  CBC with Differential/Platelet     Status: Abnormal   Collection Time: 09/11/18  6:07 AM  Result Value Ref Range   WBC 23.2 (H) 4.0 - 10.5 K/uL   RBC 3.59 (L) 3.87 - 5.11 MIL/uL   Hemoglobin 11.1 (L) 12.0 - 15.0 g/dL   HCT 35.0 (L) 36.0 - 46.0 %   MCV 97.5 80.0 - 100.0 fL   MCH 30.9 26.0 - 34.0 pg   MCHC 31.7 30.0 - 36.0 g/dL   RDW 14.2 11.5 - 15.5 %   Platelets 496 (H) 150 - 400 K/uL   nRBC 0.0 0.0 - 0.2 %   Neutrophils Relative % 82 %   Neutro Abs 19.0 (H) 1.7 - 7.7 K/uL   Lymphocytes Relative 9 %   Lymphs Abs 2.0 0.7 - 4.0 K/uL   Monocytes Relative 8 %   Monocytes Absolute 1.8 (H) 0.1 - 1.0 K/uL   Eosinophils Relative 0 %   Eosinophils Absolute 0.0 0.0 - 0.5 K/uL   Basophils Relative 0 %   Basophils Absolute 0.0 0.0 - 0.1 K/uL   Immature Granulocytes 1 %   Abs Immature Granulocytes 0.33 (H) 0.00 -  0.07 K/uL    Comment: Performed at [facility], [address], [place], [place] [address]  Comprehensive metabolic panel     Status: Abnormal   Collection Time: 09/11/18  6:07 AM  Result Value Ref Range   Sodium 141 135 - 145 mmol/L   Potassium 3.5 3.5 - 5.1 mmol/L    Comment: DELTA CHECK NOTED   Chloride 106 98 - 111 mmol/L   CO2 26 22 - 32 mmol/L   Glucose, Bld 170 (H) 70 - 99 mg/dL   BUN 10 6 - 20 mg/dL   Creatinine, Ser 0.53 0.44 - 1.00 mg/dL   Calcium 9.0 8.9 - 10.3 mg/dL   Total Protein 6.3 (L) 6.5 - 8.1 g/dL   Albumin 2.8 (L) 3.5 - 5.0 g/dL   AST 22 15 - 41 U/L   ALT 16 0 - 44 U/L   Alkaline Phosphatase 88 38 - 126 U/L   Total Bilirubin 0.6 0.3 - 1.2 mg/dL   GFR calc non Af Amer >60 >60 mL/min   GFR calc Af Amer >60 >60 mL/min   Anion gap 9 5 - 15    Comment: Performed at [facility], [address] [place], [place] [address]  Magnesium     Status: None  Collection Time: 09/11/18  6:07 AM  Result Value Ref Range   Magnesium 2.0 1.7 - 2.4 mg/dL    Comment: Performed at [facility], [address], [place], [place] [address]  Phosphorus     Status: Abnormal   Collection Time: 09/11/18  6:07 AM  Result Value Ref Range   Phosphorus 2.4 (L) 2.5 - 4.6 mg/dL    Comment: Performed at [facility], [address] [place], [place] [address]  CBC with Differential/Platelet     Status: Abnormal   Collection Time: 09/12/18  5:04 AM  Result Value Ref Range   WBC 22.5 (H) 4.0 - 10.5 K/uL   RBC 3.70 (L) 3.87 - 5.11 MIL/uL   Hemoglobin 11.2 (L) 12.0 - 15.0 g/dL   HCT 35.7 (L) 36.0 - 46.0 %   MCV 96.5 80.0 - 100.0 fL   MCH 30.3 26.0 - 34.0 pg   MCHC 31.4 30.0 - 36.0 g/dL   RDW 14.0 11.5 - 15.5 %   Platelets 542 (H) 150 - 400 K/uL   nRBC 0.0 0.0 - 0.2 %   Neutrophils Relative % 87 %   Neutro Abs 19.5 (H) 1.7 - 7.7 K/uL   Lymphocytes Relative 8 %   Lymphs Abs 1.8 0.7 - 4.0 K/uL   Monocytes Relative 4 %   Monocytes Absolute 0.9 0.1 - 1.0 K/uL   Eosinophils Relative 0 %    Eosinophils Absolute 0.1 0.0 - 0.5 K/uL   Basophils Relative 0 %   Basophils Absolute 0.0 0.0 - 0.1 K/uL   WBC Morphology MILD LEFT SHIFT (1-5% METAS, OCC MYELO, OCC BANDS)     Comment: TOXIC GRANULATION   Immature Granulocytes 1 %   Abs Immature Granulocytes 0.28 (H) 0.00 - 0.07 K/uL   Reactive, Benign Lymphocytes PRESENT     Comment: Performed at [facility], [address] [place], [place] [address]  Comprehensive metabolic panel     Status: Abnormal   Collection Time: 09/12/18  5:04 AM  Result Value Ref Range   Sodium 140 135 - 145 mmol/L   Potassium 4.1 3.5 - 5.1 mmol/L   Chloride 102 98 - 111 mmol/L   CO2 28 22 - 32 mmol/L   Glucose, Bld 125 (H) 70 - 99 mg/dL   BUN 9 6 - 20 mg/dL   Creatinine, Ser 0.46 0.44 - 1.00 mg/dL   Calcium 9.0 8.9 - 10.3 mg/dL   Total Protein 6.3 (L) 6.5 - 8.1 g/dL   Albumin 2.8 (L) 3.5 - 5.0 g/dL   AST 17 15 - 41 U/L   ALT 15 0 - 44 U/L   Alkaline Phosphatase 92 38 - 126 U/L   Total Bilirubin 0.5 0.3 - 1.2 mg/dL   GFR calc non Af Amer >60 >60 mL/min   GFR calc Af Amer >60 >60 mL/min   Anion gap 10 5 - 15    Comment: Performed at [facility], [address], [place], [place] [address]  Magnesium     Status: None   Collection Time: 09/12/18  5:04 AM  Result Value Ref Range   Magnesium 2.0 1.7 - 2.4 mg/dL    Comment: Performed at [facility], [address], [place], [place] [address]  Phosphorus     Status: None   Collection Time: 09/12/18  5:04 AM  Result Value Ref Range   Phosphorus 2.8 2.5 - 4.6 mg/dL    Comment: Performed at [facility], [address] [place], [place] [address]  ABGS No results for input(s): PHART, PO2ART, TCO2, HCO3 in the last 72 hours.  Invalid input(s): PCO2 CULTURES Recent Results (from the past 240 hour(s))  SARS Coronavirus 2 (CEPHEID- Performed in [place] hospital lab), Hosp Order     Status: None   Collection Time: 09/07/18  3:56 PM  Result Value Ref Range Status   SARS Coronavirus 2 NEGATIVE  NEGATIVE Final    Comment: (NOTE) If result is NEGATIVE SARS-CoV-2 target nucleic acids are NOT DETECTED. The SARS-CoV-2 RNA is generally detectable in upper and lower  respiratory specimens during the acute phase of infection. The lowest  concentration of SARS-CoV-2 viral copies this assay can detect is 250  copies / mL. A negative result does not preclude SARS-CoV-2 infection  and should not be used as the sole basis for treatment or other  patient management decisions.  A negative result may occur with  improper specimen collection / handling, submission of specimen other  than nasopharyngeal swab, presence of viral mutation(s) within the  areas targeted by this assay, and inadequate number of viral copies  (<250 copies / mL). A negative result must be combined with clinical  observations, patient history, and epidemiological information. If result is POSITIVE SARS-CoV-2 target nucleic acids are DETECTED. The SARS-CoV-2 RNA is generally detectable in upper and lower  respiratory specimens dur ing the acute phase of infection.  Positive  results are indicative of active infection with SARS-CoV-2.  Clinical  correlation with patient history and other diagnostic information is  necessary to determine patient infection status.  Positive results do  not rule out bacterial infection or co-infection with other viruses. If result is PRESUMPTIVE POSTIVE SARS-CoV-2 nucleic acids MAY BE PRESENT.   A presumptive positive result was obtained on the submitted specimen  and confirmed on repeat testing.  While 2019 novel coronavirus  (SARS-CoV-2) nucleic acids may be present in the submitted sample  additional confirmatory testing may be necessary for epidemiological  and / or clinical management purposes  to differentiate between  SARS-CoV-2 and other Sarbecovirus currently known to infect humans.  If clinically indicated additional testing with an alternate test  methodology [phone]) is  advised. The SARS-CoV-2 RNA is generally  detectable in upper and lower respiratory sp ecimens during the acute  phase of infection. The expected result is Negative. Fact Sheet for Patients:  StrictlyIdeas.no Fact Sheet for Healthcare Providers: BankingDealers.co.za This test is not yet approved or cleared by the [place] FDA and has been authorized for detection and/or diagnosis of SARS-CoV-2 by FDA under an Emergency Use Authorization (EUA).  This EUA will remain in effect (meaning this test can be used) for the duration of the COVID-19 declaration under Section 564(b)(1) of the Act, 21 U.S.C. section 360bbb-3(b)(1), unless the authorization is terminated or revoked sooner. Performed at [facility], [address], [place], [place] [address]   MRSA PCR Screening     Status: None   Collection Time: 09/07/18  6:53 PM  Result Value Ref Range Status   MRSA by PCR NEGATIVE NEGATIVE Final    Comment:        The GeneXpert MRSA Assay (FDA approved for NASAL specimens only), is one component of a comprehensive MRSA colonization surveillance program. It is not intended to diagnose MRSA infection nor to guide or monitor treatment for MRSA infections. Performed at [facility], [address] [place],  [address]   Culture, blood (routine x 2) Call MD if unable to obtain prior to antibiotics being given  Status: None   Collection Time: 09/07/18  9:22 PM  Result Value Ref Range Status   Specimen Description BLOOD LEFT HAND  Final   Special Requests   Final    BOTTLES DRAWN AEROBIC AND ANAEROBIC Blood Culture adequate volume   Culture   Final    NO GROWTH 5 DAYS Performed at [facility], [address], [place], [place] [address]    Report Status 09/12/2018 FINAL  Final  Culture, blood (routine x 2) Call MD if unable to obtain prior to antibiotics being given     Status: None   Collection Time: 09/07/18  9:34 PM  Result  Value Ref Range Status   Specimen Description BLOOD RIGHT ARM  Final   Special Requests   Final    BOTTLES DRAWN AEROBIC AND ANAEROBIC Blood Culture adequate volume   Culture   Final    NO GROWTH 5 DAYS Performed at [facility], [address], [place], [place] [address]    Report Status 09/12/2018 FINAL  Final  Respiratory Panel by PCR     Status: None   Collection Time: 09/08/18  1:00 PM  Result Value Ref Range Status   Adenovirus NOT DETECTED NOT DETECTED Final   Coronavirus 229E NOT DETECTED NOT DETECTED Final    Comment: (NOTE) The Coronavirus on the Respiratory Panel, DOES NOT test for the novel  Coronavirus (2019 nCoV)    Coronavirus HKU1 NOT DETECTED NOT DETECTED Final   Coronavirus NL63 NOT DETECTED NOT DETECTED Final   Coronavirus OC43 NOT DETECTED NOT DETECTED Final   Metapneumovirus NOT DETECTED NOT DETECTED Final   Rhinovirus / Enterovirus NOT DETECTED NOT DETECTED Final   Influenza A NOT DETECTED NOT DETECTED Final   Influenza B NOT DETECTED NOT DETECTED Final   Parainfluenza Virus 1 NOT DETECTED NOT DETECTED Final   Parainfluenza Virus 2 NOT DETECTED NOT DETECTED Final   Parainfluenza Virus 3 NOT DETECTED NOT DETECTED Final   Parainfluenza Virus 4 NOT DETECTED NOT DETECTED Final   Respiratory Syncytial Virus NOT DETECTED NOT DETECTED Final   Bordetella pertussis NOT DETECTED NOT DETECTED Final   Chlamydophila pneumoniae NOT DETECTED NOT DETECTED Final   Mycoplasma pneumoniae NOT DETECTED NOT DETECTED Final    Comment: Performed at [facility], [place] [address], [place], [place] [address]   Studies/Results: Dg Chest Port 1 View  Result Date: 09/11/2018 CLINICAL DATA:  Cough and shortness of breath EXAM: PORTABLE CHEST 1 VIEW COMPARISON:  09/10/2018 FINDINGS: Cardiac shadow is mildly enlarged but stable in appearance. Diffuse bilateral airspace opacities stable from the previous exam. No new superimposed abnormality is noted. IMPRESSION: Stable airspace  opacities bilaterally. No new focal abnormality is noted. Electronically Signed   By: Inez Catalina M.D.   On: 09/11/2018 08:40   Dg Chest Port 1 View  Result Date: 09/10/2018 CLINICAL DATA:  Cough and congestion EXAM: PORTABLE CHEST 1 VIEW COMPARISON:  09/09/2018 FINDINGS: Cardiac shadow is stable. Diffuse bilateral airspace opacities are again identified stable. No new focal infiltrate is seen. No bony abnormality is noted. IMPRESSION: Bilateral airspace opacities stable from the previous exam. Electronically Signed   By: Inez Catalina M.D.   On: 09/10/2018 15:45    Medications:  Prior to Admission:  Medications Prior to Admission  Medication Sig Dispense Refill Last Dose  . acetaminophen (TYLENOL) 500 MG tablet Take 1 tablet (500 mg total) by mouth every 8 (eight) hours as needed for moderate pain. 30 tablet 0 Past Week at Unknown time  .  apixaban (ELIQUIS) 5 MG TABS tablet Take 1 tablet (5 mg total) by mouth 2 (two) times daily. 2 pills mouth twice a day till 06/12/2018 then switch to 1 pill twice a day from 06/13/2018 (Patient taking differently: Take 10 mg by mouth 2 (two) times daily. ) 60 tablet 0 09/07/2018 at 800  . cetirizine (ZYRTEC) 10 MG tablet Take 10 mg by mouth daily.   09/07/2018 at Unknown time  . FLUoxetine (PROZAC) 40 MG capsule Take 1 capsule (40 mg total) by mouth daily. For depression and anxiety. 30 capsule 0 09/07/2018 at Unknown time  . Fluticasone-Umeclidin-Vilant (TRELEGY ELLIPTA) 100-62.5-25 MCG/INH AEPB Inhale 1 puff into the lungs daily.   09/06/2018 at Unknown time  . guaiFENesin (MUCINEX) 600 MG 12 hr tablet Take 800 mg by mouth 2 (two) times daily.    09/07/2018 at Unknown time  . Melatonin 5 MG CAPS Take 20 mg by mouth at bedtime.    09/06/2018 at Unknown time  . pantoprazole (PROTONIX) 40 MG tablet Take 1 tablet (40 mg total) by mouth 2 (two) times daily. 60 tablet 0 09/07/2018 at Unknown time  . Probiotic Product (PROBIOTIC PO) Take 1 tablet by mouth daily. Women's probiotic for  GI health   09/07/2018 at Unknown time  . lactose free nutrition (BOOST PLUS) LIQD Take 237 mLs by mouth 3 (three) times daily with meals. (Patient not taking: Reported on 07/27/2018) 30 Can 0 Not Taking at Unknown time  . metoprolol tartrate (LOPRESSOR) 25 MG tablet Take 1 tablet (25 mg total) by mouth 2 (two) times daily. (Patient not taking: Reported on 07/27/2018) 60 tablet 0 Not Taking at Unknown time  . ziprasidone (GEODON) 80 MG capsule Take one capsule at bedtime for depression and mood stability. (Patient not taking: Reported on 07/27/2018) 30 capsule 0 Not Taking at Unknown time   Scheduled: . apixaban  5 mg Oral BID  . benzonatate  100 mg Oral TID  . diclofenac sodium  2 g Topical QID  . feeding supplement (ENSURE ENLIVE)  237 mL Oral BID BM  . FLUoxetine  40 mg Oral Daily  . fluticasone furoate-vilanterol  1 puff Inhalation Daily   And  . umeclidinium bromide  1 puff Inhalation Daily  . guaiFENesin  1,200 mg Oral BID  . ipratropium-albuterol  3 mL Nebulization TID  . lidocaine  1 patch Transdermal Q24H  . methylPREDNISolone (SOLU-MEDROL) injection  40 mg Intravenous Q12H  . pantoprazole  40 mg Oral BID  . pneumococcal 23 valent vaccine  0.5 mL Intramuscular Tomorrow-1000  . ziprasidone  40 mg Oral BID WC   Continuous: . ceFEPime (MAXIPIME) IV 2 g (09/12/18 0533)   QHU:[id number], albuterol, HYDROcodone-homatropine, hydrOXYzine, oxyCODONE  Assesment: She was admitted with acute on chronic hypoxic respiratory failure.  This is associated with COPD exacerbation pulmonary fibrosis and healthcare associated pneumonia.  From a pulmonary point of view she seems to have improved remarkably.  She is still coughing and bringing up white sputum but she is not as dyspneic.  Her situation is complicated by her bipolar disease/schizophrenia and she is been having hallucinations and confusion mostly at night. Principal Problem:   Acute on chronic respiratory failure with hypoxia  (HCC) Active Problems:   Narcotic abuse in remission ([place])   Bipolar disorder (HCC)   Chronic pain   ADD (attention deficit disorder)   Schizophrenia, paranoid type ([place])   Thrombocytosis (HCC)   Hypokalemia   HCAP (healthcare-associated pneumonia)   History of DVT (deep vein thrombosis)  Normocytic anemia   GERD (gastroesophageal reflux disease)   Hyperglycemia   COPD with acute exacerbation (HCC)   Pulmonary fibrosis ([place])    Plan: Continue current treatments.  She seems to be approaching baseline    LOS: 5 days   Christine Ortega 09/12/2018, 8:05 AM

## 2018-09-12 NOTE — Progress Notes (Signed)
Pt removed oxygen and is upset, rapid breathing and cussing. RN at bedside with RT. Pt encouraged to calm down and take medicine to help her breathing. Pt took nebulizer treatment without incident. Pt placed o2 on after treatment

## 2018-09-12 NOTE — Care Management Important Message (Signed)
Important Message  Patient Details  Name: Christine Ortega MRN: [id number] Date of Birth: 1960-09-14   Medicare Important Message Given:  Yes    Tommy Medal 09/12/2018, 12:57 PM

## 2018-09-12 NOTE — Progress Notes (Signed)
Patient is actively hallucinating and delusional, however, she denies both.  Patient can be heard arguing and having conversations and is not on the phone with anyone.

## 2018-09-13 MED ORDER — OXYCODONE HCL 5 MG PO TABS: 2.5000 mg | ORAL_TABLET | Freq: Four times a day (QID) | ORAL | 0 refills | Status: AC | PRN

## 2018-09-13 MED ORDER — BENZONATATE 100 MG PO CAPS: 100.0000 mg | ORAL_CAPSULE | Freq: Three times a day (TID) | ORAL | 0 refills | Status: AC | PRN

## 2018-09-13 MED ORDER — DOXYCYCLINE HYCLATE 100 MG PO TABS: 100.0000 mg | ORAL_TABLET | Freq: Two times a day (BID) | ORAL | 0 refills | Status: AC

## 2018-09-13 MED ORDER — PREDNISONE 20 MG PO TABS: 40.0000 mg | ORAL_TABLET | Freq: Every day | ORAL | 0 refills | Status: AC

## 2018-09-13 MED ORDER — APIXABAN 5 MG PO TABS: 5.0000 mg | ORAL_TABLET | Freq: Two times a day (BID) | ORAL | 0 refills | Status: AC

## 2018-09-13 NOTE — TOC Transition Note (Addendum)
Transition of Care [facility]) - CM/SW Discharge Note   Patient Details  Name: Christine Ortega MRN: [id number] Date of Birth: 1960-10-14  [facility] CM/SW Contact:  Boneta Lucks, RN Phone Number: 09/13/2018, 10:53 AM   Clinical Narrative:   Patient discharging today with Home Health service, Patient is active with [facility].  Called Tim, with orders for PT/OT/SW.  Patient has no 02 for transport home. [place], her daughter will pick up a tank for transport home.     Final next level of care: [place]    Patient Goals and CMS Choice Patient states their goals for this hospitalization and ongoing recovery are:: to go home with home health.                                                       HH Arranged: PT, OT, Social Work [employer] Agency: [facility] [employer] [facility] [employer]     Representative spoke with at [place]: [place] ([place]) Interventions     Readmission Risk Interventions Readmission Risk Prevention Plan 09/10/2018 07/31/2018  Transportation Screening - Complete  PCP or Specialist Appt within 3-5 Days Not Complete Complete  HRI or [place] Not Complete Complete  Social Work Consult for [place] Planning/Counseling Not Complete Complete  SW consult not completed comments Pt having Behavioral issues MD will order TTS -  Palliative Care Screening Not Applicable Not Applicable  Medication Review (RN Care Manager) Complete Complete  Some recent data might be hidden

## 2018-09-13 NOTE — Progress Notes (Signed)
Subjective: She says she feels fairly well.  She was cleared by psychiatry yesterday.  Her breathing is better.  She is still coughing.  Objective: Vital signs in last 24 hours: Temp:  [98 F (36.7 C)-98.2 F (36.8 C)] 98.1 F (36.7 C) (06/11 0534) Pulse Rate:  [109-117] 109 (06/11 0534) Resp:  [18] 18 (06/11 0534) BP: (141-146)/(74-100) 145/99 (06/11 0534) SpO2:  [77 %-100 %] 100 % (06/11 0808) Weight change:  Last BM Date: 09/11/18  Intake/Output from previous day: 06/10 0701 - 06/11 0700 In: 720 [P.O.:720] Out: -   PHYSICAL EXAM General appearance: alert, cooperative and no distress Resp: clear to auscultation bilaterally Cardio: regular rate and rhythm, S1, S2 normal, no murmur, click, rub or gallop GI: soft, non-tender; bowel sounds normal; no masses,  no organomegaly Extremities: extremities normal, atraumatic, no cyanosis or edema  Lab Results:  Results for orders placed or performed during the hospital encounter of 09/07/18 (from the past 48 hour(s))  CBC with Differential/Platelet     Status: Abnormal   Collection Time: 09/12/18  5:04 AM  Result Value Ref Range   WBC 22.5 (H) 4.0 - 10.5 K/uL   RBC 3.70 (L) 3.87 - 5.11 MIL/uL   Hemoglobin 11.2 (L) 12.0 - 15.0 g/dL   HCT 35.7 (L) 36.0 - 46.0 %   MCV 96.5 80.0 - 100.0 fL   MCH 30.3 26.0 - 34.0 pg   MCHC 31.4 30.0 - 36.0 g/dL   RDW 14.0 11.5 - 15.5 %   Platelets 542 (H) 150 - 400 K/uL   nRBC 0.0 0.0 - 0.2 %   Neutrophils Relative % 87 %   Neutro Abs 19.5 (H) 1.7 - 7.7 K/uL   Lymphocytes Relative 8 %   Lymphs Abs 1.8 0.7 - 4.0 K/uL   Monocytes Relative 4 %   Monocytes Absolute 0.9 0.1 - 1.0 K/uL   Eosinophils Relative 0 %   Eosinophils Absolute 0.1 0.0 - 0.5 K/uL   Basophils Relative 0 %   Basophils Absolute 0.0 0.0 - 0.1 K/uL   WBC Morphology MILD LEFT SHIFT (1-5% METAS, OCC MYELO, OCC BANDS)     Comment: TOXIC GRANULATION   Immature Granulocytes 1 %   Abs Immature Granulocytes 0.28 (H) 0.00 - 0.07 K/uL    Reactive, Benign Lymphocytes PRESENT     Comment: Performed at [facility], [address], [place], [place] [address]  Comprehensive metabolic panel     Status: Abnormal   Collection Time: 09/12/18  5:04 AM  Result Value Ref Range   Sodium 140 135 - 145 mmol/L   Potassium 4.1 3.5 - 5.1 mmol/L   Chloride 102 98 - 111 mmol/L   CO2 28 22 - 32 mmol/L   Glucose, Bld 125 (H) 70 - 99 mg/dL   BUN 9 6 - 20 mg/dL   Creatinine, Ser 0.46 0.44 - 1.00 mg/dL   Calcium 9.0 8.9 - 10.3 mg/dL   Total Protein 6.3 (L) 6.5 - 8.1 g/dL   Albumin 2.8 (L) 3.5 - 5.0 g/dL   AST 17 15 - 41 U/L   ALT 15 0 - 44 U/L   Alkaline Phosphatase 92 38 - 126 U/L   Total Bilirubin 0.5 0.3 - 1.2 mg/dL   GFR calc non Af Amer >60 >60 mL/min   GFR calc Af Amer >60 >60 mL/min   Anion gap 10 5 - 15    Comment: Performed at [facility], [address], [place], [place] [address]  Magnesium  Status: None   Collection Time: 09/12/18  5:04 AM  Result Value Ref Range   Magnesium 2.0 1.7 - 2.4 mg/dL    Comment: Performed at [facility], [address], [place], [place] [address]  Phosphorus     Status: None   Collection Time: 09/12/18  5:04 AM  Result Value Ref Range   Phosphorus 2.8 2.5 - 4.6 mg/dL    Comment: Performed at [facility], [address] [place], [place] [address]    ABGS No results for input(s): PHART, PO2ART, TCO2, HCO3 in the last 72 hours.  Invalid input(s): PCO2 CULTURES Recent Results (from the past 240 hour(s))  SARS Coronavirus 2 (CEPHEID- Performed in [place] hospital lab), Hosp Order     Status: None   Collection Time: 09/07/18  3:56 PM   Specimen: Nasopharyngeal Swab  Result Value Ref Range Status   SARS Coronavirus 2 NEGATIVE NEGATIVE Final    Comment: (NOTE) If result is NEGATIVE SARS-CoV-2 target nucleic acids are NOT DETECTED. The SARS-CoV-2 RNA is generally detectable in upper and lower  respiratory specimens during the acute phase of infection. The lowest  concentration of  SARS-CoV-2 viral copies this assay can detect is 250  copies / mL. A negative result does not preclude SARS-CoV-2 infection  and should not be used as the sole basis for treatment or other  patient management decisions.  A negative result may occur with  improper specimen collection / handling, submission of specimen other  than nasopharyngeal swab, presence of viral mutation(s) within the  areas targeted by this assay, and inadequate number of viral copies  (<250 copies / mL). A negative result must be combined with clinical  observations, patient history, and epidemiological information. If result is POSITIVE SARS-CoV-2 target nucleic acids are DETECTED. The SARS-CoV-2 RNA is generally detectable in upper and lower  respiratory specimens dur ing the acute phase of infection.  Positive  results are indicative of active infection with SARS-CoV-2.  Clinical  correlation with patient history and other diagnostic information is  necessary to determine patient infection status.  Positive results do  not rule out bacterial infection or co-infection with other viruses. If result is PRESUMPTIVE POSTIVE SARS-CoV-2 nucleic acids MAY BE PRESENT.   A presumptive positive result was obtained on the submitted specimen  and confirmed on repeat testing.  While 2019 novel coronavirus  (SARS-CoV-2) nucleic acids may be present in the submitted sample  additional confirmatory testing may be necessary for epidemiological  and / or clinical management purposes  to differentiate between  SARS-CoV-2 and other Sarbecovirus currently known to infect humans.  If clinically indicated additional testing with an alternate test  methodology [phone]) is advised. The SARS-CoV-2 RNA is generally  detectable in upper and lower respiratory sp ecimens during the acute  phase of infection. The expected result is Negative. Fact Sheet for Patients:  StrictlyIdeas.no Fact Sheet for Healthcare  Providers: BankingDealers.co.za This test is not yet approved or cleared by the [place] FDA and has been authorized for detection and/or diagnosis of SARS-CoV-2 by FDA under an Emergency Use Authorization (EUA).  This EUA will remain in effect (meaning this test can be used) for the duration of the COVID-19 declaration under Section 564(b)(1) of the Act, 21 U.S.C. section 360bbb-3(b)(1), unless the authorization is terminated or revoked sooner. Performed at [facility], [address], [place], [place] [address]   MRSA PCR Screening     Status: None   Collection Time: 09/07/18  6:53 PM  Specimen: Nasal Mucosa; Nasopharyngeal  Result Value Ref Range Status   MRSA by PCR NEGATIVE NEGATIVE Final    Comment:        The GeneXpert MRSA Assay (FDA approved for NASAL specimens only), is one component of a comprehensive MRSA colonization surveillance program. It is not intended to diagnose MRSA infection nor to guide or monitor treatment for MRSA infections. Performed at [facility], [address] [place], [place] [address]   Culture, blood (routine x 2) Call MD if unable to obtain prior to antibiotics being given     Status: None   Collection Time: 09/07/18  9:22 PM   Specimen: BLOOD LEFT HAND  Result Value Ref Range Status   Specimen Description BLOOD LEFT HAND  Final   Special Requests   Final    BOTTLES DRAWN AEROBIC AND ANAEROBIC Blood Culture adequate volume   Culture   Final    NO GROWTH 5 DAYS Performed at [facility], [address] [place], [place] [address]    Report Status 09/12/2018 FINAL  Final  Culture, blood (routine x 2) Call MD if unable to obtain prior to antibiotics being given     Status: None   Collection Time: 09/07/18  9:34 PM   Specimen: BLOOD RIGHT ARM  Result Value Ref Range Status   Specimen Description BLOOD RIGHT ARM  Final   Special Requests   Final    BOTTLES DRAWN AEROBIC AND ANAEROBIC Blood Culture adequate volume    Culture   Final    NO GROWTH 5 DAYS Performed at [facility], [address], [place], [place] [address]    Report Status 09/12/2018 FINAL  Final  Respiratory Panel by PCR     Status: None   Collection Time: 09/08/18  1:00 PM   Specimen: Nasopharyngeal Swab; Respiratory  Result Value Ref Range Status   Adenovirus NOT DETECTED NOT DETECTED Final   Coronavirus 229E NOT DETECTED NOT DETECTED Final    Comment: (NOTE) The Coronavirus on the Respiratory Panel, DOES NOT test for the novel  Coronavirus (2019 nCoV)    Coronavirus HKU1 NOT DETECTED NOT DETECTED Final   Coronavirus NL63 NOT DETECTED NOT DETECTED Final   Coronavirus OC43 NOT DETECTED NOT DETECTED Final   Metapneumovirus NOT DETECTED NOT DETECTED Final   Rhinovirus / Enterovirus NOT DETECTED NOT DETECTED Final   Influenza A NOT DETECTED NOT DETECTED Final   Influenza B NOT DETECTED NOT DETECTED Final   Parainfluenza Virus 1 NOT DETECTED NOT DETECTED Final   Parainfluenza Virus 2 NOT DETECTED NOT DETECTED Final   Parainfluenza Virus 3 NOT DETECTED NOT DETECTED Final   Parainfluenza Virus 4 NOT DETECTED NOT DETECTED Final   Respiratory Syncytial Virus NOT DETECTED NOT DETECTED Final   Bordetella pertussis NOT DETECTED NOT DETECTED Final   Chlamydophila pneumoniae NOT DETECTED NOT DETECTED Final   Mycoplasma pneumoniae NOT DETECTED NOT DETECTED Final    Comment: Performed at [facility], [place]. [address], [place], [place] [address]   Studies/Results: Dg Chest 1 View  Result Date: 09/12/2018 CLINICAL DATA:  Chest wall pain EXAM: CHEST  1 VIEW COMPARISON:  09/11/2018 chest radiograph. FINDINGS: Left rotated chest radiograph stable cardiomediastinal silhouette with normal heart size. No pneumothorax. No pleural effusion. Extensive patchy reticular opacity throughout both lungs, right greater than left, not substantially changed accounting for differences in patient position. IMPRESSION: No appreciable change in extensive  patchy reticular opacities throughout the right greater than left lungs, compatible with advanced fibrotic interstitial  lung disease as seen on 07/27/2018 chest CT. Electronically Signed   By: Ilona Sorrel M.D.   On: 09/12/2018 14:18    Medications:  Prior to Admission:  Medications Prior to Admission  Medication Sig Dispense Refill Last Dose  . acetaminophen (TYLENOL) 500 MG tablet Take 1 tablet (500 mg total) by mouth every 8 (eight) hours as needed for moderate pain. 30 tablet 0   . apixaban (ELIQUIS) 5 MG TABS tablet Take 1 tablet (5 mg total) by mouth 2 (two) times daily. 2 pills mouth twice a day till 06/12/2018 then switch to 1 pill twice a day from 06/13/2018 (Patient taking differently: Take 10 mg by mouth 2 (two) times daily. ) 60 tablet 0 09/07/2018 at 800  . cetirizine (ZYRTEC) 10 MG tablet Take 10 mg by mouth daily.   09/07/2018 at Unknown time  . FLUoxetine (PROZAC) 40 MG capsule Take 1 capsule (40 mg total) by mouth daily. For depression and anxiety. 30 capsule 0 09/07/2018 at Unknown time  . Fluticasone-Umeclidin-Vilant (TRELEGY ELLIPTA) 100-62.5-25 MCG/INH AEPB Inhale 1 puff into the lungs daily.   09/06/2018 at Unknown time  . guaiFENesin (MUCINEX) 600 MG 12 hr tablet Take 800 mg by mouth 2 (two) times daily.    09/07/2018 at Unknown time  . Melatonin 5 MG CAPS Take 20 mg by mouth at bedtime.    09/06/2018 at Unknown time  . pantoprazole (PROTONIX) 40 MG tablet Take 1 tablet (40 mg total) by mouth 2 (two) times daily. 60 tablet 0 09/07/2018 at Unknown time  . Probiotic Product (PROBIOTIC PO) Take 1 tablet by mouth daily. Women's probiotic for GI health   09/07/2018 at Unknown time  . lactose free nutrition (BOOST PLUS) LIQD Take 237 mLs by mouth 3 (three) times daily with meals. (Patient not taking: Reported on 07/27/2018) 30 Can 0 Not Taking at Unknown time  . metoprolol tartrate (LOPRESSOR) 25 MG tablet Take 1 tablet (25 mg total) by mouth 2 (two) times daily. (Patient not taking: Reported on  07/27/2018) 60 tablet 0 Not Taking at Unknown time  . ziprasidone (GEODON) 80 MG capsule Take one capsule at bedtime for depression and mood stability. (Patient not taking: Reported on 07/27/2018) 30 capsule 0 Not Taking at Unknown time   Scheduled: . apixaban  5 mg Oral BID  . benzonatate  100 mg Oral TID  . diclofenac sodium  2 g Topical QID  . doxycycline  100 mg Oral Q12H  . feeding supplement (ENSURE ENLIVE)  237 mL Oral BID BM  . FLUoxetine  40 mg Oral Daily  . fluticasone furoate-vilanterol  1 puff Inhalation Daily   And  . umeclidinium bromide  1 puff Inhalation Daily  . guaiFENesin  1,200 mg Oral BID  . ipratropium-albuterol  3 mL Nebulization TID  . lidocaine  1 patch Transdermal Q24H  . pantoprazole  40 mg Oral BID  . pneumococcal 23 valent vaccine  0.5 mL Intramuscular Tomorrow-1000  . predniSONE  40 mg Oral Q breakfast  . ziprasidone  40 mg Oral BID WC   Continuous:  [id number], albuterol, HYDROcodone-homatropine, hydrOXYzine, oxyCODONE  Assesment: She was admitted with acute on chronic hypoxic respiratory failure.  This is because she had healthcare associated pneumonia and has severe COPD and has interstitial lung disease with pulmonary fibrosis.  She is substantially improved.  She has schizophrenia and was cleared by psychiatry yesterday Principal Problem:   Acute on chronic respiratory failure with hypoxia ([place]) Active Problems:   Narcotic abuse in  remission ([place])   Bipolar disorder (HCC)   Chronic pain   ADD (attention deficit disorder)   Schizophrenia, paranoid type ([place])   Thrombocytosis ([place])   Hypokalemia   HCAP (healthcare-associated pneumonia)   History of DVT (deep vein thrombosis)   Normocytic anemia   GERD (gastroesophageal reflux disease)   Hyperglycemia   COPD with acute exacerbation (HCC)   Pulmonary fibrosis ([place])    Plan: She is I think approaching maximum hospital benefit.  I will plan to follow more peripherally    LOS: 6 days    Christine Ortega 09/13/2018, 8:21 AM

## 2018-09-13 NOTE — Discharge Summary (Addendum)
Physician Discharge Summary  Christine Ortega KXF:[id number] DOB: 24-Sep-1960 DOA: 09/07/2018  PCP: Lucia Gaskins, MD  Admit date: 09/07/2018 Discharge date: 09/13/2018  Admitted From:  Home  Disposition: Home   Recommendations for Outpatient Follow-up:  1. Follow up with PCP in 1 weeks / pulm 2 weeks  Discharge Condition: STABLE   CODE STATUS: FULL    Brief Hospitalization Summary: Please see all hospital notes, images, labs for full details of the hospitalization. Dr. Glenna Durand HPI: Christine Ortega is a 58 y.o. female with a history of seizure disorder, schizophrenia, depression, chronic pain, pulmonary fibrosis, history of DVT on eliquis.  The patient has been admitted twice since January for community-acquired pneumonia respiratory failure.  She feels that she breathes much better when on steroids and her breathing gets worse off steroids.  This last episode started approximately 2 weeks ago with a gradual decline.  She is now dyspneic to just a few steps.  She does have oxygen at home, which she needed to turn up as well.  She has been using her inhalers that she has been prescribed.  White sputum production with no other palliating or provoking factors.  Emergency Department Course: Patient started on IV steroids, healthcare associated pneumonia antibiotics.  Blood cultures obtained.  Brief Admission Hx: 58 year old female with schizophrenia, depression, chronic pain, opioid dependent, pulmonary fibrosis, epilepsy and history of DVT fully anticoagulated with apixaban presented with shortness of breath and acute on chronic respiratory failure.  MDM/Assessment & Plan:   1. Acute on chronic respiratory failure with hypoxia-secondary to COPD exacerbation, pulmonary fibrosis and HCAP-She is improved and at her baseline and asking to go home.   2. HCAP -as she is clinically improved home, DC home on doxycycline 100 mg twice daily.   3. COPD exacerbation - clinically improved to baseline  on current therapy.    4. History of DVT - continue apixaban 5 mg BID.  5. Pulmonary fibrosis - Pt has been seen and followed by pulmonologist.  She seems to be at her baseline respiratory status.   6. Bipolar disorder / paranoid schizophrenia - Pt has been refusing geodon treatments and has had active psychosis.  Pt is medically stable now and can be evaluated by TTS services.  TTS evaluated patient and recommended outpatient follow up with her psychiatrist.  7. History of opioid abuse - avoided IV opioids, managed with as needed oral oxycodone.  Follow up with PCP.   8. Hypokalemia - repleted.  9. Hypophosphatemia - repleted. 10. Normocytic anemia - Hg holding stable.  Recheck outpatient with PCP.  11. Reactive thrombocytosis - stable.  12. GERD - treated with PPI therapy.     DVT prophylaxis: apixaban Code Status: Full  Family Communication: Spouse updated telephone Disposition Plan: Home   Consultants:  pulmonology  Procedures:    Antimicrobials:  Cefepime >>   Discharge Diagnoses:  Principal Problem:   Acute on chronic respiratory failure with hypoxia ([place]) Active Problems:   Narcotic abuse in remission ([place])   Bipolar disorder (HCC)   Chronic pain   ADD (attention deficit disorder)   Schizophrenia, paranoid type ([place])   Thrombocytosis ([place])   Hypokalemia   HCAP (healthcare-associated pneumonia)   History of DVT (deep vein thrombosis)   Normocytic anemia   GERD (gastroesophageal reflux disease)   Hyperglycemia   COPD with acute exacerbation (HCC)   Pulmonary fibrosis ([place])  Discharge Instructions: Discharge Instructions    Call MD for:  difficulty breathing, headache or visual disturbances  Complete by: As directed    Call MD for:  extreme fatigue   Complete by: As directed    Call MD for:  persistant dizziness or light-headedness   Complete by: As directed    Call MD for:  severe uncontrolled pain   Complete by: As directed    Increase activity  slowly   Complete by: As directed      Allergies as of 09/13/2018      Reactions   Nsaids Other (See Comments)   Causes GI bleeding   Tramadol Hcl    Per Daughter- patient has " sleep paralysis"       Medication List    STOP taking these medications   lactose free nutrition Liqd   metoprolol tartrate 25 MG tablet Commonly known as: LOPRESSOR     TAKE these medications   acetaminophen 500 MG tablet Commonly known as: TYLENOL Take 1 tablet (500 mg total) by mouth every 8 (eight) hours as needed for moderate pain.   apixaban 5 MG Tabs tablet Commonly known as: ELIQUIS Take 1 tablet (5 mg total) by mouth 2 (two) times daily. What changed: additional instructions   benzonatate 100 MG capsule Commonly known as: TESSALON Take 1 capsule (100 mg total) by mouth 3 (three) times daily as needed for cough.   cetirizine 10 MG tablet Commonly known as: ZYRTEC Take 10 mg by mouth daily.   doxycycline 100 MG tablet Commonly known as: VIBRA-TABS Take 1 tablet (100 mg total) by mouth every 12 (twelve) hours for 5 days.   FLUoxetine 40 MG capsule Commonly known as: PROZAC Take 1 capsule (40 mg total) by mouth daily. For depression and anxiety.   guaiFENesin 600 MG 12 hr tablet Commonly known as: MUCINEX Take 800 mg by mouth 2 (two) times daily.   Melatonin 5 MG Caps Take 20 mg by mouth at bedtime.   oxyCODONE 5 MG immediate release tablet Commonly known as: Oxy IR/ROXICODONE Take 0.5 tablets (2.5 mg total) by mouth every 6 (six) hours as needed for severe pain.   pantoprazole 40 MG tablet Commonly known as: Protonix Take 1 tablet (40 mg total) by mouth 2 (two) times daily.   predniSONE 20 MG tablet Commonly known as: DELTASONE Take 2 tablets (40 mg total) by mouth daily with breakfast for 7 days. Start taking on: September 14, 2018   PROBIOTIC PO Take 1 tablet by mouth daily. Women's probiotic for GI health   Trelegy Ellipta 100-62.5-25 MCG/INH Aepb Generic drug:  Fluticasone-Umeclidin-Vilant Inhale 1 puff into the lungs daily.   ziprasidone 80 MG capsule Commonly known as: GEODON Take one capsule at bedtime for depression and mood stability.      Follow-up Information    Lucia Gaskins, MD. Schedule an appointment as soon as possible for a visit in 1 week(s).   Specialty: Internal Medicine Why: Hospital Follow Up Contact information: [place] [address] [phone]        Sinda Du, MD. Schedule an appointment as soon as possible for a visit in 2 week(s).   Specialty: Pulmonary Disease Why: Hospital Follow Up Contact information: [place] [address] [phone]          Allergies  Allergen Reactions  . Nsaids Other (See Comments)    Causes GI bleeding  . Tramadol Hcl     Per Daughter- patient has " sleep paralysis"    Allergies as of 09/13/2018      Reactions   Nsaids Other (  See Comments)   Causes GI bleeding   Tramadol Hcl    Per Daughter- patient has " sleep paralysis"       Medication List    STOP taking these medications   lactose free nutrition Liqd   metoprolol tartrate 25 MG tablet Commonly known as: LOPRESSOR     TAKE these medications   acetaminophen 500 MG tablet Commonly known as: TYLENOL Take 1 tablet (500 mg total) by mouth every 8 (eight) hours as needed for moderate pain.   apixaban 5 MG Tabs tablet Commonly known as: ELIQUIS Take 1 tablet (5 mg total) by mouth 2 (two) times daily. What changed: additional instructions   benzonatate 100 MG capsule Commonly known as: TESSALON Take 1 capsule (100 mg total) by mouth 3 (three) times daily as needed for cough.   cetirizine 10 MG tablet Commonly known as: ZYRTEC Take 10 mg by mouth daily.   doxycycline 100 MG tablet Commonly known as: VIBRA-TABS Take 1 tablet (100 mg total) by mouth every 12 (twelve) hours for 5 days.   FLUoxetine 40 MG capsule Commonly known as: PROZAC Take 1 capsule  (40 mg total) by mouth daily. For depression and anxiety.   guaiFENesin 600 MG 12 hr tablet Commonly known as: MUCINEX Take 800 mg by mouth 2 (two) times daily.   Melatonin 5 MG Caps Take 20 mg by mouth at bedtime.   oxyCODONE 5 MG immediate release tablet Commonly known as: Oxy IR/ROXICODONE Take 0.5 tablets (2.5 mg total) by mouth every 6 (six) hours as needed for severe pain.   pantoprazole 40 MG tablet Commonly known as: Protonix Take 1 tablet (40 mg total) by mouth 2 (two) times daily.   predniSONE 20 MG tablet Commonly known as: DELTASONE Take 2 tablets (40 mg total) by mouth daily with breakfast for 7 days. Start taking on: September 14, 2018   PROBIOTIC PO Take 1 tablet by mouth daily. Women's probiotic for GI health   Trelegy Ellipta 100-62.5-25 MCG/INH Aepb Generic drug: Fluticasone-Umeclidin-Vilant Inhale 1 puff into the lungs daily.   ziprasidone 80 MG capsule Commonly known as: GEODON Take one capsule at bedtime for depression and mood stability.      Procedures/Studies: Dg Chest 1 View  Result Date: 09/12/2018 CLINICAL DATA:  Chest wall pain EXAM: CHEST  1 VIEW COMPARISON:  09/11/2018 chest radiograph. FINDINGS: Left rotated chest radiograph stable cardiomediastinal silhouette with normal heart size. No pneumothorax. No pleural effusion. Extensive patchy reticular opacity throughout both lungs, right greater than left, not substantially changed accounting for differences in patient position. IMPRESSION: No appreciable change in extensive patchy reticular opacities throughout the right greater than left lungs, compatible with advanced fibrotic interstitial lung disease as seen on 07/27/2018 chest CT. Electronically Signed   By: Ilona Sorrel M.D.   On: 09/12/2018 14:18   Dg Chest Port 1 View  Result Date: 09/11/2018 CLINICAL DATA:  Cough and shortness of breath EXAM: PORTABLE CHEST 1 VIEW COMPARISON:  09/10/2018 FINDINGS: Cardiac shadow is mildly enlarged but stable in  appearance. Diffuse bilateral airspace opacities stable from the previous exam. No new superimposed abnormality is noted. IMPRESSION: Stable airspace opacities bilaterally. No new focal abnormality is noted. Electronically Signed   By: Inez Catalina M.D.   On: 09/11/2018 08:40   Dg Chest Port 1 View  Result Date: 09/10/2018 CLINICAL DATA:  Cough and congestion EXAM: PORTABLE CHEST 1 VIEW COMPARISON:  09/09/2018 FINDINGS: Cardiac shadow is stable. Diffuse bilateral airspace opacities are again identified stable. No  new focal infiltrate is seen. No bony abnormality is noted. IMPRESSION: Bilateral airspace opacities stable from the previous exam. Electronically Signed   By: Inez Catalina M.D.   On: 09/10/2018 15:45   Dg Chest Port 1 View  Result Date: 09/09/2018 CLINICAL DATA:  Shortness of breath EXAM: PORTABLE CHEST 1 VIEW COMPARISON:  Chest radiograph 08/08/2018 FINDINGS: Monitoring leads overlie the patient. Stable cardiomegaly. Slight improved aeration mild improvement in diffuse bilateral airspace opacities. No pleural effusion or pneumothorax. IMPRESSION: Mild interval improvement diffuse bilateral airspace opacities. Findings may be secondary to infection or edema chronic background of fibrotic changes. Electronically Signed   By: Lovey Newcomer M.D.   On: 09/09/2018 09:11   Dg Chest Port 1 View  Result Date: 09/08/2018 CLINICAL DATA:  Pneumonia twice this year with current difficulty breathing with cough. EXAM: PORTABLE CHEST 1 VIEW COMPARISON:  09/07/2018, 05/19/2018 and chest CT 05/23/2018 FINDINGS: Lungs are somewhat hypoinflated demonstrate a background of diffuse interstitial disease compatible with known fibrosis. There is mild persistent hazy bilateral central lung opacification which may be due to superimposed edema versus infection. No effusion. Cardiomediastinal silhouette and remainder of the exam is unchanged. IMPRESSION: Persistent hazy central lung opacification which may be due to mild  interstitial edema versus acute infection on background of chronic fibrosis. Electronically Signed   By: Marin Olp M.D.   On: 09/08/2018 13:28   Dg Chest Port 1 View  Result Date: 09/07/2018 CLINICAL DATA:  Cough with shortness of breath. EXAM: PORTABLE CHEST 1 VIEW COMPARISON:  08/01/2018. FINDINGS: The heart is enlarged. On a background of chronic interstitial lung disease, there is asymmetric increased opacity in the RIGHT mid and lower lung zones which could represent infection or edema. No effusion or pneumothorax. Bones unremarkable. IMPRESSION: Cardiomegaly. Asymmetric increased opacity in the RIGHT mid and lower lung zones which could represent infection or edema. Worsening aeration. Chronic interstitial lung disease. Correlate clinically for pulmonary edema versus early RIGHT lung infiltrate. Electronically Signed   By: Staci Righter M.D.   On: 09/07/2018 17:14     Subjective: Pt says she is breathing much better and wants to go home.    Discharge Exam: Vitals:   09/13/18 0807 09/13/18 0808  BP:    Pulse:    Resp:    Temp:    SpO2: 99% 100%   Vitals:   09/13/18 0534 09/13/18 0802 09/13/18 0807 09/13/18 0808  BP: (!) 145/99     Pulse: (!) 109     Resp: 18     Temp: 98.1 F (36.7 C)     TempSrc: Oral     SpO2: 98% (!) 77% 99% 100%  Weight:      Height:        General exam: appears in no distress.  Respiratory system: BBS clear to aus, No increased work of breathing. Cardiovascular system: S1 & S2 heard. No JVD, murmurs, gallops, clicks or pedal edema. Gastrointestinal system: Abdomen is nondistended, soft and nontender. Normal bowel sounds heard. Central nervous system: Pt alert, No focal neurological deficits. Extremities: no CCE.   The results of significant diagnostics from this hospitalization (including imaging, microbiology, ancillary and laboratory) are listed below for reference.     Microbiology: Recent Results (from the past 240 hour(s))  SARS  Coronavirus 2 (CEPHEID- Performed in [facility] hospital lab), Hosp Order     Status: None   Collection Time: 09/07/18  3:56 PM   Specimen: Nasopharyngeal Swab  Result Value Ref Range Status  SARS Coronavirus 2 NEGATIVE NEGATIVE Final    Comment: (NOTE) If result is NEGATIVE SARS-CoV-2 target nucleic acids are NOT DETECTED. The SARS-CoV-2 RNA is generally detectable in upper and lower  respiratory specimens during the acute phase of infection. The lowest  concentration of SARS-CoV-2 viral copies this assay can detect is 250  copies / mL. A negative result does not preclude SARS-CoV-2 infection  and should not be used as the sole basis for treatment or other  patient management decisions.  A negative result may occur with  improper specimen collection / handling, submission of specimen other  than nasopharyngeal swab, presence of viral mutation(s) within the  areas targeted by this assay, and inadequate number of viral copies  (<250 copies / mL). A negative result must be combined with clinical  observations, patient history, and epidemiological information. If result is POSITIVE SARS-CoV-2 target nucleic acids are DETECTED. The SARS-CoV-2 RNA is generally detectable in upper and lower  respiratory specimens dur ing the acute phase of infection.  Positive  results are indicative of active infection with SARS-CoV-2.  Clinical  correlation with patient history and other diagnostic information is  necessary to determine patient infection status.  Positive results do  not rule out bacterial infection or co-infection with other viruses. If result is PRESUMPTIVE POSTIVE SARS-CoV-2 nucleic acids MAY BE PRESENT.   A presumptive positive result was obtained on the submitted specimen  and confirmed on repeat testing.  While 2019 novel coronavirus  (SARS-CoV-2) nucleic acids may be present in the submitted sample  additional confirmatory testing may be necessary for epidemiological  and / or  clinical management purposes  to differentiate between  SARS-CoV-2 and other Sarbecovirus currently known to infect humans.  If clinically indicated additional testing with an alternate test  methodology [phone]) is advised. The SARS-CoV-2 RNA is generally  detectable in upper and lower respiratory sp ecimens during the acute  phase of infection. The expected result is Negative. Fact Sheet for Patients:  StrictlyIdeas.no Fact Sheet for Healthcare Providers: BankingDealers.co.za This test is not yet approved or cleared by the [place] FDA and has been authorized for detection and/or diagnosis of SARS-CoV-2 by FDA under an Emergency Use Authorization (EUA).  This EUA will remain in effect (meaning this test can be used) for the duration of the COVID-19 declaration under Section 564(b)(1) of the Act, 21 U.S.C. section 360bbb-3(b)(1), unless the authorization is terminated or revoked sooner. Performed at [facility], [address] [place], [place] [address]   MRSA PCR Screening     Status: None   Collection Time: 09/07/18  6:53 PM   Specimen: Nasal Mucosa; Nasopharyngeal  Result Value Ref Range Status   MRSA by PCR NEGATIVE NEGATIVE Final    Comment:        The GeneXpert MRSA Assay (FDA approved for NASAL specimens only), is one component of a comprehensive MRSA colonization surveillance program. It is not intended to diagnose MRSA infection nor to guide or monitor treatment for MRSA infections. Performed at [facility], [address], [place], [place] [address]   Culture, blood (routine x 2) Call MD if unable to obtain prior to antibiotics being given     Status: None   Collection Time: 09/07/18  9:22 PM   Specimen: BLOOD LEFT HAND  Result Value Ref Range Status   Specimen Description BLOOD LEFT HAND  Final   Special Requests   Final    BOTTLES DRAWN AEROBIC AND ANAEROBIC Blood Culture adequate volume  Culture   Final     NO GROWTH 5 DAYS Performed at [facility], [address] [place], [place] [address]    Report Status 09/12/2018 FINAL  Final  Culture, blood (routine x 2) Call MD if unable to obtain prior to antibiotics being given     Status: None   Collection Time: 09/07/18  9:34 PM   Specimen: BLOOD RIGHT ARM  Result Value Ref Range Status   Specimen Description BLOOD RIGHT ARM  Final   Special Requests   Final    BOTTLES DRAWN AEROBIC AND ANAEROBIC Blood Culture adequate volume   Culture   Final    NO GROWTH 5 DAYS Performed at [facility], [address] [place], [place] [address]    Report Status 09/12/2018 FINAL  Final  Respiratory Panel by PCR     Status: None   Collection Time: 09/08/18  1:00 PM   Specimen: Nasopharyngeal Swab; Respiratory  Result Value Ref Range Status   Adenovirus NOT DETECTED NOT DETECTED Final   Coronavirus 229E NOT DETECTED NOT DETECTED Final    Comment: (NOTE) The Coronavirus on the Respiratory Panel, DOES NOT test for the novel  Coronavirus (2019 nCoV)    Coronavirus HKU1 NOT DETECTED NOT DETECTED Final   Coronavirus NL63 NOT DETECTED NOT DETECTED Final   Coronavirus OC43 NOT DETECTED NOT DETECTED Final   Metapneumovirus NOT DETECTED NOT DETECTED Final   Rhinovirus / Enterovirus NOT DETECTED NOT DETECTED Final   Influenza A NOT DETECTED NOT DETECTED Final   Influenza B NOT DETECTED NOT DETECTED Final   Parainfluenza Virus 1 NOT DETECTED NOT DETECTED Final   Parainfluenza Virus 2 NOT DETECTED NOT DETECTED Final   Parainfluenza Virus 3 NOT DETECTED NOT DETECTED Final   Parainfluenza Virus 4 NOT DETECTED NOT DETECTED Final   Respiratory Syncytial Virus NOT DETECTED NOT DETECTED Final   Bordetella pertussis NOT DETECTED NOT DETECTED Final   Chlamydophila pneumoniae NOT DETECTED NOT DETECTED Final   Mycoplasma pneumoniae NOT DETECTED NOT DETECTED Final    Comment: Performed at [facility], [address] [place], [place] [address]     Labs: BNP  (last 3 results) Recent Labs    07/27/18 1208 09/07/18 1556  BNP 25.0 62.5   Basic Metabolic Panel: Recent Labs  Lab 09/08/18 0555 09/09/18 0712 09/10/18 0556 09/11/18 0607 09/12/18 0504  NA 137 142 139 141 140  K 3.5 3.3* 2.7* 3.5 4.1  CL 100 99 100 106 102  CO2 24 28 26 26 28   GLUCOSE 159* 137* 147* 170* 125*  BUN 6 11 11 10 9   CREATININE 0.53 0.56 0.60 0.53 0.46  CALCIUM 8.8* 8.9 9.2 9.0 9.0  MG  --  1.9 2.0 2.0 2.0  PHOS  --  3.3 2.7 2.4* 2.8   Liver Function Tests: Recent Labs  Lab 09/07/18 1556 09/09/18 0712 09/10/18 0556 09/11/18 0607 09/12/18 0504  AST 21 18 34 22 17  ALT 10 11 16 16 15   ALKPHOS 123 104 101 88 92  BILITOT 0.4 0.3 0.4 0.6 0.5  PROT 6.6 6.6 6.8 6.3* 6.3*  ALBUMIN 2.8* 2.9* 3.1* 2.8* 2.8*   No results for input(s): LIPASE, AMYLASE in the last 168 hours. No results for input(s): AMMONIA in the last 168 hours. CBC: Recent Labs  Lab 09/07/18 1556 09/08/18 0555 09/09/18 0712 09/10/18 0556 09/11/18 0607 09/12/18 0504  WBC 13.1* 9.1 25.9* 24.9* 23.2* 22.5*  NEUTROABS 6.6  --  21.1* 21.9* 19.0* 19.5*  HGB  11.6* 11.3* 11.2* 11.1* 11.1* 11.2*  HCT 36.5 35.3* 35.2* 34.9* 35.0* 35.7*  MCV 98.1 96.7 95.4 96.7 97.5 96.5  PLT 535* 505* 592* 554* 496* 542*   Cardiac Enzymes: Recent Labs  Lab 09/07/18 1556  TROPONINI <0.03   BNP: Invalid input(s): POCBNP CBG: No results for input(s): GLUCAP in the last 168 hours. D-Dimer No results for input(s): DDIMER in the last 72 hours. Hgb A1c No results for input(s): HGBA1C in the last 72 hours. Lipid Profile No results for input(s): CHOL, HDL, LDLCALC, TRIG, CHOLHDL, LDLDIRECT in the last 72 hours. Thyroid function studies No results for input(s): TSH, T4TOTAL, T3FREE, THYROIDAB in the last 72 hours.  Invalid input(s): FREET3 Anemia work up No results for input(s): VITAMINB12, FOLATE, FERRITIN, TIBC, IRON, RETICCTPCT in the last 72 hours. Urinalysis    Component Value Date/Time    COLORURINE YELLOW 05/19/2018 0921   APPEARANCEUR CLOUDY (A) 05/19/2018 0921   LABSPEC 1.008 05/19/2018 0921   PHURINE 6.0 05/19/2018 0921   GLUCOSEU NEGATIVE 05/19/2018 0921   HGBUR SMALL (A) 05/19/2018 0921   BILIRUBINUR NEGATIVE 05/19/2018 0921   KETONESUR NEGATIVE 05/19/2018 0921   PROTEINUR 30 (A) 05/19/2018 0921   NITRITE NEGATIVE 05/19/2018 0921   LEUKOCYTESUR LARGE (A) 05/19/2018 0921   Sepsis Labs Invalid input(s): PROCALCITONIN,  WBC,  LACTICIDVEN Microbiology Recent Results (from the past 240 hour(s))  SARS Coronavirus 2 (CEPHEID- Performed in [place] hospital lab), Hosp Order     Status: None   Collection Time: 09/07/18  3:56 PM   Specimen: Nasopharyngeal Swab  Result Value Ref Range Status   SARS Coronavirus 2 NEGATIVE NEGATIVE Final    Comment: (NOTE) If result is NEGATIVE SARS-CoV-2 target nucleic acids are NOT DETECTED. The SARS-CoV-2 RNA is generally detectable in upper and lower  respiratory specimens during the acute phase of infection. The lowest  concentration of SARS-CoV-2 viral copies this assay can detect is 250  copies / mL. A negative result does not preclude SARS-CoV-2 infection  and should not be used as the sole basis for treatment or other  patient management decisions.  A negative result may occur with  improper specimen collection / handling, submission of specimen other  than nasopharyngeal swab, presence of viral mutation(s) within the  areas targeted by this assay, and inadequate number of viral copies  (<250 copies / mL). A negative result must be combined with clinical  observations, patient history, and epidemiological information. If result is POSITIVE SARS-CoV-2 target nucleic acids are DETECTED. The SARS-CoV-2 RNA is generally detectable in upper and lower  respiratory specimens dur ing the acute phase of infection.  Positive  results are indicative of active infection with SARS-CoV-2.  Clinical  correlation with patient history and  other diagnostic information is  necessary to determine patient infection status.  Positive results do  not rule out bacterial infection or co-infection with other viruses. If result is PRESUMPTIVE POSTIVE SARS-CoV-2 nucleic acids MAY BE PRESENT.   A presumptive positive result was obtained on the submitted specimen  and confirmed on repeat testing.  While 2019 novel coronavirus  (SARS-CoV-2) nucleic acids may be present in the submitted sample  additional confirmatory testing may be necessary for epidemiological  and / or clinical management purposes  to differentiate between  SARS-CoV-2 and other Sarbecovirus currently known to infect humans.  If clinically indicated additional testing with an alternate test  methodology [phone]) is advised. The SARS-CoV-2 RNA is generally  detectable in upper and lower respiratory sp ecimens during  the acute  phase of infection. The expected result is Negative. Fact Sheet for Patients:  StrictlyIdeas.no Fact Sheet for Healthcare Providers: BankingDealers.co.za This test is not yet approved or cleared by the [place] FDA and has been authorized for detection and/or diagnosis of SARS-CoV-2 by FDA under an Emergency Use Authorization (EUA).  This EUA will remain in effect (meaning this test can be used) for the duration of the COVID-19 declaration under Section 564(b)(1) of the Act, 21 U.S.C. section 360bbb-3(b)(1), unless the authorization is terminated or revoked sooner. Performed at [facility], [address], [place], [place] [address]   MRSA PCR Screening     Status: None   Collection Time: 09/07/18  6:53 PM   Specimen: Nasal Mucosa; Nasopharyngeal  Result Value Ref Range Status   MRSA by PCR NEGATIVE NEGATIVE Final    Comment:        The GeneXpert MRSA Assay (FDA approved for NASAL specimens only), is one component of a comprehensive MRSA colonization surveillance program. It is  not intended to diagnose MRSA infection nor to guide or monitor treatment for MRSA infections. Performed at [facility], [address], [place], [place] [address]   Culture, blood (routine x 2) Call MD if unable to obtain prior to antibiotics being given     Status: None   Collection Time: 09/07/18  9:22 PM   Specimen: BLOOD LEFT HAND  Result Value Ref Range Status   Specimen Description BLOOD LEFT HAND  Final   Special Requests   Final    BOTTLES DRAWN AEROBIC AND ANAEROBIC Blood Culture adequate volume   Culture   Final    NO GROWTH 5 DAYS Performed at [facility], [address] [place], [place] [address]    Report Status 09/12/2018 FINAL  Final  Culture, blood (routine x 2) Call MD if unable to obtain prior to antibiotics being given     Status: None   Collection Time: 09/07/18  9:34 PM   Specimen: BLOOD RIGHT ARM  Result Value Ref Range Status   Specimen Description BLOOD RIGHT ARM  Final   Special Requests   Final    BOTTLES DRAWN AEROBIC AND ANAEROBIC Blood Culture adequate volume   Culture   Final    NO GROWTH 5 DAYS Performed at [facility], [address] [place], [place] [address]    Report Status 09/12/2018 FINAL  Final  Respiratory Panel by PCR     Status: None   Collection Time: 09/08/18  1:00 PM   Specimen: Nasopharyngeal Swab; Respiratory  Result Value Ref Range Status   Adenovirus NOT DETECTED NOT DETECTED Final   Coronavirus 229E NOT DETECTED NOT DETECTED Final    Comment: (NOTE) The Coronavirus on the Respiratory Panel, DOES NOT test for the novel  Coronavirus (2019 nCoV)    Coronavirus HKU1 NOT DETECTED NOT DETECTED Final   Coronavirus NL63 NOT DETECTED NOT DETECTED Final   Coronavirus OC43 NOT DETECTED NOT DETECTED Final   Metapneumovirus NOT DETECTED NOT DETECTED Final   Rhinovirus / Enterovirus NOT DETECTED NOT DETECTED Final   Influenza A NOT DETECTED NOT DETECTED Final   Influenza B NOT DETECTED NOT DETECTED Final   Parainfluenza Virus 1 NOT  DETECTED NOT DETECTED Final   Parainfluenza Virus 2 NOT DETECTED NOT DETECTED Final   Parainfluenza Virus 3 NOT DETECTED NOT DETECTED Final   Parainfluenza Virus 4 NOT DETECTED NOT DETECTED Final   Respiratory Syncytial Virus NOT DETECTED NOT DETECTED Final   Bordetella pertussis NOT  DETECTED NOT DETECTED Final   Chlamydophila pneumoniae NOT DETECTED NOT DETECTED Final   Mycoplasma pneumoniae NOT DETECTED NOT DETECTED Final    Comment: Performed at [facility], [place] [address], [place],  [address]   Time coordinating discharge:  33 minutes   SIGNED:  Irwin Brakeman, MD  [facility] 09/13/2018, 9:43 AM How to contact the [facility] Attending or Consulting provider [place] or covering provider during after hours [place], for this patient?  1. Check the care team in [facility] and look for a) attending/consulting [facility] provider listed and b) the [facility] team listed 2. Log into [email or web address] and use [facility]'s universal password to access. If you do not have the password, please contact the hospital operator. 3. Locate the [facility] provider you are looking for under [facility] and page to a number that you can be directly reached. 4. If you still have difficulty reaching the provider, please page the [facility] (Director on Call) for the Hospitalists listed on amion for assistance.

## 2018-09-13 NOTE — Discharge Instructions (Signed)
Please follow up with psychiatrist as soon as possible.  Please follow up with PCP in 1 week.   IMPORTANT INFORMATION: PAY CLOSE ATTENTION   PHYSICIAN DISCHARGE INSTRUCTIONS  Follow with Primary care provider  Lucia Gaskins, MD  and other consultants as instructed your Hospitalist Physician  SEEK MEDICAL CARE OR RETURN TO EMERGENCY ROOM IF SYMPTOMS COME BACK, WORSEN OR NEW PROBLEM DEVELOPS.   Please note: You were cared for by a hospitalist during your hospital stay. Every effort will be made to forward records to your primary care provider.  You can request that your primary care provider send for your hospital records if they have not received them.  Once you are discharged, your primary care physician will handle any further medical issues. Please note that NO REFILLS for any discharge medications will be authorized once you are discharged, as it is imperative that you return to your primary care physician (or establish a relationship with a primary care physician if you do not have one) for your post hospital discharge needs so that they can reassess your need for medications and monitor your lab values.  Please get a complete blood count and chemistry panel checked by your Primary MD at your next visit, and again as instructed by your Primary MD.  Get Medicines reviewed and adjusted: Please take all your medications with you for your next visit with your Primary MD  Laboratory/radiological data: Please request your Primary MD to go over all hospital tests and procedure/radiological results at the follow up, please ask your primary care provider to get all Hospital records sent to his/her office.  In some cases, they will be blood work, cultures and biopsy results pending at the time of your discharge. Please request that your primary care provider follow up on these results.  If you are diabetic, please bring your blood sugar readings with you to your follow up appointment with  primary care.    Please call and make your follow up appointments as soon as possible.    Also Note the following: If you experience worsening of your admission symptoms, develop shortness of breath, life threatening emergency, suicidal or homicidal thoughts you must seek medical attention immediately by calling 911 or calling your MD immediately  if symptoms less severe.  You must read complete instructions/literature along with all the possible adverse reactions/side effects for all the Medicines you take and that have been prescribed to you. Take any new Medicines after you have completely understood and accpet all the possible adverse reactions/side effects.   Do not drive when taking Pain medications or sleeping medications (Benzodiazepines)  Do not take more than prescribed Pain, Sleep and Anxiety Medications. It is not advisable to combine anxiety,sleep and pain medications without talking with your primary care practitioner  Special Instructions: If you have smoked or chewed Tobacco  in the last 2 yrs please stop smoking, stop any regular Alcohol  and or any Recreational drug use.  Wear Seat belts while driving.        COPD and Physical Activity Chronic obstructive pulmonary disease (COPD) is a long-term (chronic) condition that affects the lungs. COPD is a general term that can be used to describe many different lung problems that cause lung swelling (inflammation) and limit airflow, including chronic bronchitis and emphysema. The main symptom of COPD is shortness of breath, which makes it harder to do even simple tasks. This can also make it harder to exercise and be active. Talk with your health care  provider about treatments to help you breathe better and actions you can take to prevent breathing problems during physical activity. What are the benefits of exercising with COPD? Exercising regularly is an important part of a healthy lifestyle. You can still exercise and do  physical activities even though you have COPD. Exercise and physical activity improve your shortness of breath by increasing blood flow (circulation). This causes your heart to pump more oxygen through your body. Moderate exercise can improve your:  Oxygen use.  Energy level.  Shortness of breath.  Strength in your breathing muscles.  Heart health.  Sleep.  Self-esteem and feelings of self-worth.  Depression, stress, and anxiety levels. Exercise can benefit everyone with COPD. The severity of your disease may affect how hard you can exercise, especially at first, but everyone can benefit. Talk with your health care provider about how much exercise is safe for you, and which activities and exercises are safe for you. What actions can I take to prevent breathing problems during physical activity?  Sign up for a pulmonary rehabilitation program. This type of program may include: ? Education about lung diseases. ? Exercise classes that teach you how to exercise and be more active while improving your breathing. This usually involves:  Exercise using your lower extremities, such as a stationary bicycle.  About 30 minutes of exercise, 2 to 5 times per week, for 6 to 12 weeks  Strength training, such as push ups or leg lifts. ? Nutrition education. ? Group classes in which you can talk with others who also have COPD and learn ways to manage stress.  If you use an oxygen tank, you should use it while you exercise. Work with your health care provider to adjust your oxygen for your physical activity. Your resting flow rate is different from your flow rate during physical activity.  While you are exercising: ? Take slow breaths. ? Pace yourself and do not try to go too fast. ? Purse your lips while breathing out. Pursing your lips is similar to a kissing or whistling position. ? If doing exercise that uses a quick burst of effort, such as weight lifting:  Breathe in before starting the  exercise.  Breathe out during the hardest part of the exercise (such as raising the weights). Where to find support You can find support for exercising with COPD from:  Your health care provider.  A pulmonary rehabilitation program.  Your local health department or community health programs.  Support groups, online or in-person. Your health care provider may be able to recommend support groups. Where to find more information You can find more information about exercising with COPD from:  American Lung Association: ClassInsider.se.  COPD Foundation: https://www.rivera.net/. Contact a health care provider if:  Your symptoms get worse.  You have chest pain.  You have nausea.  You have a fever.  You have trouble talking or catching your breath.  You want to start a new exercise program or a new activity. Summary  COPD is a general term that can be used to describe many different lung problems that cause lung swelling (inflammation) and limit airflow. This includes chronic bronchitis and emphysema.  Exercise and physical activity improve your shortness of breath by increasing blood flow (circulation). This causes your heart to provide more oxygen to your body.  Contact your health care provider before starting any exercise program or new activity. Ask your health care provider what exercises and activities are safe for you. This information is not  intended to replace advice given to you by your health care provider. Make sure you discuss any questions you have with your health care provider. Document Released: 04/13/2017 Document Revised: 04/13/2017 Document Reviewed: 04/13/2017 Elsevier Interactive Patient Education  2019 [employer].

## 2018-09-15 ENCOUNTER — Emergency Department (HOSPITAL_COMMUNITY): Payer: Medicare Other

## 2018-09-15 ENCOUNTER — Observation Stay (HOSPITAL_COMMUNITY)
Admission: EM | Admit: 2018-09-15 | Discharge: 2018-09-16 | Disposition: A | Discharge status: Home / Self Care | Payer: Medicare Other | Attending: Family Medicine | Admitting: Family Medicine

## 2018-09-15 ENCOUNTER — Other Ambulatory Visit: Payer: Self-pay

## 2018-09-15 ENCOUNTER — Encounter (HOSPITAL_COMMUNITY): Payer: Self-pay | Admitting: *Deleted

## 2018-09-15 DIAGNOSIS — J449 Chronic obstructive pulmonary disease, unspecified: Secondary | ICD-10-CM | POA: Diagnosis not present

## 2018-09-15 DIAGNOSIS — J982 Interstitial emphysema: Principal | ICD-10-CM | POA: Insufficient documentation

## 2018-09-15 DIAGNOSIS — D72829 Elevated white blood cell count, unspecified: Secondary | ICD-10-CM | POA: Insufficient documentation

## 2018-09-15 DIAGNOSIS — Z8659 Personal history of other mental and behavioral disorders: Secondary | ICD-10-CM | POA: Insufficient documentation

## 2018-09-15 DIAGNOSIS — D75839 Thrombocytosis, unspecified: Secondary | ICD-10-CM | POA: Diagnosis present

## 2018-09-15 DIAGNOSIS — D473 Essential (hemorrhagic) thrombocythemia: Secondary | ICD-10-CM | POA: Diagnosis present

## 2018-09-15 DIAGNOSIS — F209 Schizophrenia, unspecified: Secondary | ICD-10-CM | POA: Diagnosis not present

## 2018-09-15 DIAGNOSIS — Z7901 Long term (current) use of anticoagulants: Secondary | ICD-10-CM | POA: Diagnosis not present

## 2018-09-15 DIAGNOSIS — F319 Bipolar disorder, unspecified: Secondary | ICD-10-CM | POA: Diagnosis present

## 2018-09-15 DIAGNOSIS — R079 Chest pain, unspecified: Secondary | ICD-10-CM | POA: Diagnosis present

## 2018-09-15 DIAGNOSIS — Z79899 Other long term (current) drug therapy: Secondary | ICD-10-CM | POA: Diagnosis not present

## 2018-09-15 DIAGNOSIS — Z20828 Contact with and (suspected) exposure to other viral communicable diseases: Secondary | ICD-10-CM | POA: Insufficient documentation

## 2018-09-15 DIAGNOSIS — Z87891 Personal history of nicotine dependence: Secondary | ICD-10-CM | POA: Diagnosis not present

## 2018-09-15 DIAGNOSIS — D649 Anemia, unspecified: Secondary | ICD-10-CM | POA: Diagnosis present

## 2018-09-15 DIAGNOSIS — R0602 Shortness of breath: Secondary | ICD-10-CM | POA: Diagnosis not present

## 2018-09-15 DIAGNOSIS — F909 Attention-deficit hyperactivity disorder, unspecified type: Secondary | ICD-10-CM | POA: Insufficient documentation

## 2018-09-15 DIAGNOSIS — R000 Tachycardia, unspecified: Secondary | ICD-10-CM | POA: Diagnosis not present

## 2018-09-15 DIAGNOSIS — F200 Paranoid schizophrenia: Secondary | ICD-10-CM | POA: Diagnosis present

## 2018-09-15 DIAGNOSIS — Z86718 Personal history of other venous thrombosis and embolism: Secondary | ICD-10-CM

## 2018-09-15 DIAGNOSIS — K219 Gastro-esophageal reflux disease without esophagitis: Secondary | ICD-10-CM | POA: Diagnosis present

## 2018-09-15 LAB — CBC WITH DIFFERENTIAL/PLATELET
Abs Immature Granulocytes: 0.47 10*3/uL — ABNORMAL HIGH (ref 0.00–0.07)
Basophils Absolute: 0.1 10*3/uL (ref 0.0–0.1)
Basophils Relative: 0 %
Eosinophils Absolute: 0 10*3/uL (ref 0.0–0.5)
Eosinophils Relative: 0 %
HCT: 36.1 % (ref 36.0–46.0)
Hemoglobin: 11.5 g/dL — ABNORMAL LOW (ref 12.0–15.0)
Immature Granulocytes: 2 %
Lymphocytes Relative: 8 %
Lymphs Abs: 2.4 10*3/uL (ref 0.7–4.0)
MCH: 30.7 pg (ref 26.0–34.0)
MCHC: 31.9 g/dL (ref 30.0–36.0)
MCV: 96.3 fL (ref 80.0–100.0)
Monocytes Absolute: 1.4 10*3/uL — ABNORMAL HIGH (ref 0.1–1.0)
Monocytes Relative: 5 %
Neutro Abs: 24.7 10*3/uL — ABNORMAL HIGH (ref 1.7–7.7)
Neutrophils Relative %: 85 %
Platelets: 593 10*3/uL — ABNORMAL HIGH (ref 150–400)
RBC: 3.75 MIL/uL — ABNORMAL LOW (ref 3.87–5.11)
RDW: 14.8 % (ref 11.5–15.5)
WBC: 29 10*3/uL — ABNORMAL HIGH (ref 4.0–10.5)
nRBC: 0 % (ref 0.0–0.2)

## 2018-09-15 LAB — TROPONIN I: Troponin I: <0.03 ng/mL (ref <0.03)

## 2018-09-15 LAB — BASIC METABOLIC PANEL WITH GFR
Anion gap: 15 (ref 5–15)
BUN: 14 mg/dL (ref 6–20)
CO2: 23 mmol/L (ref 22–32)
Calcium: 9.1 mg/dL (ref 8.9–10.3)
Chloride: 102 mmol/L (ref 98–111)
Creatinine, Ser: 0.54 mg/dL (ref 0.44–1.00)
GFR calc Af Amer: >60 mL/min
GFR calc non Af Amer: >60 mL/min
Glucose, Bld: 127 mg/dL — ABNORMAL HIGH (ref 70–99)
Potassium: 4.7 mmol/L (ref 3.5–5.1)
Sodium: 140 mmol/L (ref 135–145)

## 2018-09-15 MED ORDER — IOHEXOL 350 MG/ML SOLN
75.0000 mL | Freq: Once | INTRAVENOUS | Status: AC | PRN
  Administered 2018-09-15: 75 mL via INTRAVENOUS

## 2018-09-15 MED ORDER — OXYCODONE HCL 5 MG PO TABS
10.0000 mg | ORAL_TABLET | Freq: Once | ORAL | Status: AC
  Administered 2018-09-15: 10 mg via ORAL
  Filled 2018-09-15: qty 2

## 2018-09-15 MED ORDER — ALBUTEROL SULFATE (2.5 MG/3ML) 0.083% IN NEBU
5.0000 mg | INHALATION_SOLUTION | Freq: Once | RESPIRATORY_TRACT | Status: AC
  Administered 2018-09-15: 5 mg via RESPIRATORY_TRACT
  Filled 2018-09-15: qty 6

## 2018-09-15 MED ORDER — ACETAMINOPHEN 325 MG PO TABS
650.0000 mg | ORAL_TABLET | Freq: Once | ORAL | Status: AC
  Administered 2018-09-15: 650 mg via ORAL
  Filled 2018-09-15: qty 2

## 2018-09-15 NOTE — ED Triage Notes (Signed)
Pt c/o sob that started to get worse yesterday; pt states she has some broken ribs and has had pneumonia this year already

## 2018-09-15 NOTE — ED Notes (Signed)
Pt states that she usually takes oxy for her pain but "doesn't know where it is." "it vanished" says that her daughter usually manages her meds.

## 2018-09-15 NOTE — ED Provider Notes (Signed)
[facility] EMERGENCY DEPARTMENT Provider Note   CSN: [id number] Arrival date & time: 09/15/18  2018    History   Chief Complaint Chief Complaint  Patient presents with   Shortness of Breath    HPI Christine Ortega is a 58 y.o. female with a hx of anxiety, pulmonary fibrosis, bipolar disorder, chronic pain, schizophrenia, seizures presents to the Emergency Department complaining of gradual, persistent, progressively worsening chest pain and shortness of breath onset 2-3 days ago.  Pt reports she was recently released from the hospital and she has had worsening symptoms since that time.  She reports severe pain in her ribs and chest, worse with breathing and moving. Pt reports she has been taking Roxicodone 2.5mg  Q6 hrs at home with minimal relief. She reports that her last dose was at noon today. She reports that when she went to take her 6pm dose tonight, her daughter told her that the "medication disappeared."  Pt reports she does not know where it went.  She reports being very inactive since her d/c from the hospital, but denies leg swelling or pain. She reports utilizing her home oxygen without relief of her SOB.  Pt reports wearing 3l via [place] at home.  She has not had a follow-up since her discharge. Pt denies measured fever at home.   Pt was d/c home with Eliquis prescription on 09/13/2018.  Records do indicate a hx of DVT.  She reports her daughter manages her medication and does not know if she is taking this or not.   Records reviewed.  Patient has been admitted twice since January for community-acquired pneumonia and respiratory failure.  Patient was admitted again on 09/07/2018 with pneumonia.  She was treated for healthcare acquired pneumonia and discharged on doxycycline.  She did have IV steroids and nebulizers while inpatient.  It does not appear that she was intubated during this last admission.  Patient's goal oxygen saturations was 92%.  Per record, patient does have a history of  chronic abuse was managed with oral narcotics while inpatient.  Patient was discharged on 09/13/2018 with prednisone and Tessalon.     The history is provided by the patient and medical records. No language interpreter was used.    Past Medical History:  Diagnosis Date   ADD (attention deficit disorder)    Anxiety    Bipolar disorder ([place])    Chronic pain    Depression    Narcotic abuse in remission ([place])    Schizophrenia ([place])    Seizures ([place])     Patient Active Problem List   Diagnosis Date Noted   Normocytic anemia 09/08/2018   GERD (gastroesophageal reflux disease) 09/08/2018   Hyperglycemia 09/08/2018   COPD with acute exacerbation ([place]) 09/08/2018   Pulmonary fibrosis ([place]) 09/08/2018   Acute on chronic respiratory failure with hypoxia ([place]) 09/07/2018   History of DVT (deep vein thrombosis) 09/07/2018   HCAP (healthcare-associated pneumonia) 08/01/2018   Bowel perforation ([place])    Acute blood loss anemia 05/19/2018   Thrombocytosis ([place]) 05/19/2018   Hypokalemia    Schizophrenia, paranoid type ([place]) 08/08/2011   Narcotic abuse in remission ([place])    Bipolar disorder ([place])    Chronic pain    ADD (attention deficit disorder)     Past Surgical History:  Procedure Laterality Date   BACK SURGERY     ESOPHAGOGASTRODUODENOSCOPY N/A 05/19/2018   Procedure: ESOPHAGOGASTRODUODENOSCOPY (EGD);  Surgeon: Yetta Flock, MD;  Location: [facility] ENDOSCOPY;  Service: Gastroenterology;  Laterality: N/A;  egd  at bedside in ICU, intubated   [place]  05/20/2018   IR ANGIOGRAM SELECTIVE EACH ADDITIONAL VESSEL  05/20/2018   IR ANGIOGRAM VISCERAL SELECTIVE  05/20/2018   IR ANGIOGRAM VISCERAL SELECTIVE  05/20/2018   IR EMBO ART  VEN HEMORR LYMPH EXTRAV  INC GUIDE ROADMAPPING  05/20/2018   IR US GUIDE VASC ACCESS LEFT  05/20/2018     OB History   No obstetric history on file.      Home Medications    Prior to  Admission medications   Medication Sig Start Date End Date Taking? Authorizing Provider  acetaminophen (TYLENOL) 500 MG tablet Take 1 tablet (500 mg total) by mouth every 8 (eight) hours as needed for moderate pain. 06/08/18 06/08/19  Thurnell Lose, MD  apixaban (ELIQUIS) 5 MG TABS tablet Take 1 tablet (5 mg total) by mouth 2 (two) times daily. 09/13/18   Johnson, Clanford L, MD  benzonatate (TESSALON) 100 MG capsule Take 1 capsule (100 mg total) by mouth 3 (three) times daily as needed for cough. 09/13/18   Johnson, Clanford L, MD  cetirizine (ZYRTEC) 10 MG tablet Take 10 mg by mouth daily.    [provider]  doxycycline (VIBRA-TABS) 100 MG tablet Take 1 tablet (100 mg total) by mouth every 12 (twelve) hours for 5 days. 09/13/18 09/18/18  Johnson, Clanford L, MD  FLUoxetine (PROZAC) 40 MG capsule Take 1 capsule (40 mg total) by mouth daily. For depression and anxiety. 08/08/11   Ruben Im, PA-C  Fluticasone-Umeclidin-Vilant (TRELEGY ELLIPTA) 100-62.5-25 MCG/INH AEPB Inhale 1 puff into the lungs daily.    [provider]  guaiFENesin (MUCINEX) 600 MG 12 hr tablet Take 800 mg by mouth 2 (two) times daily.     [provider]  Melatonin 5 MG CAPS Take 20 mg by mouth at bedtime.     [provider]  oxyCODONE (OXY IR/ROXICODONE) 5 MG immediate release tablet Take 0.5 tablets (2.5 mg total) by mouth every 6 (six) hours as needed for severe pain. 09/13/18   Johnson, Clanford L, MD  pantoprazole (PROTONIX) 40 MG tablet Take 1 tablet (40 mg total) by mouth 2 (two) times daily. 06/08/18   Thurnell Lose, MD  predniSONE (DELTASONE) 20 MG tablet Take 2 tablets (40 mg total) by mouth daily with breakfast for 7 days. 09/14/18 09/21/18  Murlean Iba, MD  Probiotic Product (PROBIOTIC PO) Take 1 tablet by mouth daily. Women's probiotic for GI health    [provider]  ziprasidone (GEODON) 80 MG capsule Take one capsule at bedtime for depression and mood  stability. Patient not taking: Reported on 07/27/2018 08/08/11   Pamelia Hoit    Family History History reviewed. No pertinent family history.  Social History Social History   Tobacco Use   Smoking status: Former Smoker    Packs/day: 0.50   Smokeless tobacco: Never Used  Substance Use Topics   Alcohol use: No   Drug use: No     Allergies   Nsaids and Tramadol hcl   Review of Systems Review of Systems  Constitutional: Negative for appetite change, diaphoresis, fatigue, fever and unexpected weight change.  HENT: Negative for mouth sores.   Eyes: Negative for visual disturbance.  Respiratory: Positive for cough, chest tightness and shortness of breath. Negative for wheezing.   Cardiovascular: Positive for chest pain.  Gastrointestinal: Negative for abdominal pain, constipation, diarrhea, nausea and vomiting.  Endocrine: Negative for polydipsia, polyphagia and polyuria.  Genitourinary: Negative for dysuria, frequency, hematuria and urgency.  Musculoskeletal: Negative for back pain and neck stiffness.  Skin: Negative for rash.  Allergic/Immunologic: Negative for immunocompromised state.  Neurological: Negative for syncope, light-headedness and headaches.  Hematological: Does not bruise/bleed easily.  Psychiatric/Behavioral: Negative for sleep disturbance. The patient is not nervous/anxious.      Physical Exam Updated Vital Signs BP (!) 144/82    Pulse (!) 117    Temp 97.8 F (36.6 C) (Oral)    Resp (!) 32    Ht 5\' 3"  (1.6 m)    Wt 49.9 kg    SpO2 98%    BMI 19.49 kg/m   Physical Exam Vitals signs and nursing note reviewed.  Constitutional:      General: She is not in acute distress.    Appearance: She is not diaphoretic.  HENT:     Head: Normocephalic.  Eyes:     General: No scleral icterus.    Conjunctiva/sclera: Conjunctivae normal.  Neck:     Musculoskeletal: Normal range of motion.  Cardiovascular:     Rate and Rhythm: Regular rhythm.  Tachycardia present.     Pulses: Normal pulses.          Radial pulses are 2+ on the right side and 2+ on the left side.  Pulmonary:     Effort: Tachypnea and accessory muscle usage (mild) present. No prolonged expiration, respiratory distress or retractions.     Breath sounds: No stridor. Decreased breath sounds (throughout) present. No wheezing.     Comments: Equal chest rise. Abdominal:     General: There is no distension.     Palpations: Abdomen is soft.     Tenderness: There is no abdominal tenderness. There is no guarding or rebound.  Musculoskeletal:        General: No swelling or tenderness.     Right lower leg: No edema.     Left lower leg: No edema.     Comments: Moves all extremities equally and without difficulty. No calf tenderness  Skin:    General: Skin is warm and dry.     Capillary Refill: Capillary refill takes less than 2 seconds.  Neurological:     Mental Status: She is alert.     GCS: GCS eye subscore is 4. GCS verbal subscore is 5. GCS motor subscore is 6.     Comments: Speech is clear and goal oriented.  Psychiatric:        Mood and Affect: Mood is anxious.     Comments: Pt is crying and screaming that she hurts too badly to breathe.       ED Treatments / Results  Labs (all labs ordered are listed, but only abnormal results are displayed) Labs Reviewed  CBC WITH DIFFERENTIAL/PLATELET - Abnormal; Notable for the following components:      Result Value   WBC 29.0 (*)    RBC 3.75 (*)    Hemoglobin 11.5 (*)    Platelets 593 (*)    Neutro Abs 24.7 (*)    Monocytes Absolute 1.4 (*)    Abs Immature Granulocytes 0.47 (*)    All other components within normal limits  BASIC METABOLIC PANEL - Abnormal; Notable for the following components:   Glucose, Bld 127 (*)    All other components within normal limits  TROPONIN I    EKG EKG Interpretation  Date/Time:  Saturday September 15 2018 20:31:34 EDT Ventricular Rate:  121 PR Interval:    QRS  Duration: 80  QT Interval:  324 QTC Calculation: 460 R Axis:   39 Text Interpretation:  Sinus tachycardia Consider right atrial enlargement Baseline wander When compared with ECG of 09/07/2018 No significant change was found Confirmed by Francine Graven [phone]) on 09/15/2018 9:05:40 PM   Radiology Dg Chest Port 1 View  Result Date: 09/15/2018 CLINICAL DATA:  Shortness of breath EXAM: PORTABLE CHEST 1 VIEW COMPARISON:  09/12/2018 FINDINGS: Again identified are coarsened reticular airspace opacities throughout both lung fields. This appearance is similar to multiple prior studies. There is no new focal area of consolidation. The heart size is unchanged. There is no acute osseous abnormality. IMPRESSION: Stable appearance of the chest with no new focal abnormality. Electronically Signed   By: Constance Holster M.D.   On: 09/15/2018 21:24    Procedures Procedures (including critical care time)  Medications Ordered in ED Medications  albuterol (PROVENTIL) (2.5 MG/3ML) 0.083% nebulizer solution 5 mg (5 mg Nebulization Given 09/15/18 2051)  oxyCODONE (Oxy IR/ROXICODONE) immediate release tablet 10 mg (10 mg Oral Given 09/15/18 2137)     Initial Impression / Assessment and Plan / ED Course  I have reviewed the triage vital signs and the nursing notes.  Pertinent labs & imaging results that were available during my care of the patient were reviewed by me and considered in my medical decision making (see chart for details).  Clinical Course as of Sep 15 2211  Sat Sep 15, 2018  2137 tachycardia  Pulse Rate(!): 125 [HM]  2137 Pt requiring 4lpm of oxygen.  She normally wears 3lpm via [place].   SpO2: 93 % [HM]    Clinical Course User Index [HM] Emad Brechtel, Gwenlyn Perking        Presents with chest pain and shortness of breath worsening over the last several days after discharge from the hospital on 09/13/2018 after being treated for healthcare acquired pneumonia.  She is tachycardic and  tachypneic.  She has required additional oxygen to keep her oxygen saturations in the low 90s while here in the emergency department.  She has significant rib pain.  Patient given oxycodone here in the emergency room.  Patient has been largely immobile over the last several days.  She is anticoagulated on Eliquis due to a history of DVT.  Unknown if patient has been compliant with this as her daughter has been managing her medications.  No swelling or tenderness to her legs.  With significant leukocytosis of 29,000 however suspect this is secondary to several weeks of steroids.  She is afebrile here in the emergency department.  BMP with normal electrolytes and troponin negative.  Patient remains tachycardic after pain control.  Chest x-ray is unchanged from discharge x-ray 2 days ago.  Will obtain CT angios to rule out PE and persistent pneumonia.  If all this is negative, patient's heart rate improves, her pain is controlled and she does not require additional oxygen she may be discharged home.  At shift change care was transferred to [facility], PA-C who will follow pending studies, re-evaulate and determine disposition.     Final Clinical Impressions(s) / ED Diagnoses   Final diagnoses:  SOB (shortness of breath)    ED Discharge Orders    None       Trayon Krantz, Gwenlyn Perking 09/15/18 [place], [place], DO 09/19/18 1519

## 2018-09-16 ENCOUNTER — Other Ambulatory Visit: Payer: Self-pay

## 2018-09-16 ENCOUNTER — Encounter (HOSPITAL_COMMUNITY): Payer: Self-pay | Admitting: Internal Medicine

## 2018-09-16 DIAGNOSIS — J982 Interstitial emphysema: Secondary | ICD-10-CM | POA: Diagnosis not present

## 2018-09-16 LAB — CBC WITH DIFFERENTIAL/PLATELET
Abs Immature Granulocytes: 0.46 10*3/uL — ABNORMAL HIGH (ref 0.00–0.07)
Basophils Absolute: 0.1 10*3/uL (ref 0.0–0.1)
Basophils Relative: 0 %
Eosinophils Absolute: 0 10*3/uL (ref 0.0–0.5)
Eosinophils Relative: 0 %
HCT: 33.4 % — ABNORMAL LOW (ref 36.0–46.0)
Hemoglobin: 10.8 g/dL — ABNORMAL LOW (ref 12.0–15.0)
Immature Granulocytes: 2 %
Lymphocytes Relative: 17 %
Lymphs Abs: 4.8 10*3/uL — ABNORMAL HIGH (ref 0.7–4.0)
MCH: 31.3 pg (ref 26.0–34.0)
MCHC: 32.3 g/dL (ref 30.0–36.0)
MCV: 96.8 fL (ref 80.0–100.0)
Monocytes Absolute: 1.9 10*3/uL — ABNORMAL HIGH (ref 0.1–1.0)
Monocytes Relative: 6 %
Neutro Abs: 21.7 10*3/uL — ABNORMAL HIGH (ref 1.7–7.7)
Neutrophils Relative %: 75 %
Platelets: 533 10*3/uL — ABNORMAL HIGH (ref 150–400)
RBC: 3.45 MIL/uL — ABNORMAL LOW (ref 3.87–5.11)
RDW: 14.8 % (ref 11.5–15.5)
WBC: 28.9 10*3/uL — ABNORMAL HIGH (ref 4.0–10.5)
nRBC: 0 % (ref 0.0–0.2)

## 2018-09-16 LAB — SARS CORONAVIRUS 2 BY RT PCR (HOSPITAL ORDER, PERFORMED IN ~~LOC~~ HOSPITAL LAB): SARS Coronavirus 2: NEGATIVE

## 2018-09-16 MED ORDER — PROCHLORPERAZINE EDISYLATE 10 MG/2ML IJ SOLN
5.0000 mg | INTRAMUSCULAR | Status: DC | PRN
  Administered 2018-09-16: 5 mg via INTRAVENOUS
  Filled 2018-09-16: qty 2

## 2018-09-16 MED ORDER — LORAZEPAM 2 MG/ML IJ SOLN: 1.0000 mg | Freq: Four times a day (QID) | INTRAMUSCULAR | Status: DC | PRN

## 2018-09-16 MED ORDER — METHYLPREDNISOLONE SODIUM SUCC 125 MG IJ SOLR
125.0000 mg | Freq: Once | INTRAMUSCULAR | Status: AC
  Administered 2018-09-16: 125 mg via INTRAVENOUS
  Filled 2018-09-16: qty 2

## 2018-09-16 MED ORDER — OXYCODONE HCL 5 MG PO TABS
5.0000 mg | ORAL_TABLET | Freq: Once | ORAL | Status: AC
  Administered 2018-09-16: 5 mg via ORAL
  Filled 2018-09-16: qty 1

## 2018-09-16 MED ORDER — HYDROMORPHONE HCL 1 MG/ML IJ SOLN
1.0000 mg | INTRAMUSCULAR | Status: DC | PRN
  Administered 2018-09-16 (×2): 1 mg via INTRAVENOUS
  Filled 2018-09-16 (×2): qty 1

## 2018-09-16 MED ORDER — MAGNESIUM SULFATE 2 GM/50ML IV SOLN
2.0000 g | Freq: Once | INTRAVENOUS | Status: AC
  Administered 2018-09-16: 2 g via INTRAVENOUS
  Filled 2018-09-16: qty 50

## 2018-09-16 MED ORDER — IPRATROPIUM-ALBUTEROL 0.5-2.5 (3) MG/3ML IN SOLN
3.0000 mL | Freq: Four times a day (QID) | RESPIRATORY_TRACT | Status: DC
  Administered 2018-09-16: 3 mL via RESPIRATORY_TRACT
  Filled 2018-09-16: qty 3

## 2018-09-16 MED ORDER — PANTOPRAZOLE SODIUM 40 MG IV SOLR
40.0000 mg | INTRAVENOUS | Status: DC
  Administered 2018-09-16: 40 mg via INTRAVENOUS
  Filled 2018-09-16: qty 40

## 2018-09-16 MED ORDER — BENZONATATE 100 MG PO CAPS
200.0000 mg | ORAL_CAPSULE | Freq: Once | ORAL | Status: AC
  Administered 2018-09-16: 200 mg via ORAL
  Filled 2018-09-16: qty 2

## 2018-09-16 MED ORDER — SODIUM CHLORIDE 0.9 % IV SOLN
INTRAVENOUS | Status: DC
  Administered 2018-09-16: 03:00:00 via INTRAVENOUS

## 2018-09-16 NOTE — Discharge Summary (Signed)
  Patient was admitted on 09/16/2018.......   Patient left AGAINST MEDICAL ADVICE just a few hours after being admitted   Please see full H&P from same date of service Thank you   Just a few hours after admission patient decided to leave [place]---  Please see full H&P dictated on same date of service  1)Pneumomediastinum----CTA chest shows pneumo-mediastinum raising concerns about possible esophageal or tracheal bronchial tree injury---despite prolonged and repeated persuasion from myself, patient's RN Ms. Lowry Ram, patient's husband Lennette Bihari... Patient adamantly refuses to have water soluble esophagogram study to rule out esophageal tear, patient refuses transfer to [place] for possible cardiothoracic evaluation.  Patient insisted on leaving [place]. I called patient's husband again, he tried to persuade patient to stay patient adamant that she wants to go home patient's husband picked patient up and patient left AGAINST MEDICAL ADVICE... Patient has been advised to return or call 911 if significant shortness of breath chest pains, fevers, chills, or other concerns.  2)Schizophrenia, Paranoid Schizophrenia and Depressive Disorder--- during recent hospitalization last week she was evaluated by Tele- psychiatry on 09/12/2018----the psychiatrist recommended outpatient follow-up with patient's primary psychiatrist--- patient previously was going to [place]... Patient refuses to take Geodon or Depakote  3)H/o DVT--- no bleeding concerns at this time, patient has chronic anemia, hemoglobin is currently stable at 10.8 which is around patient's baseline, c/n Apixaban  4)Pulm Fibrosis/COPD--- stable, continue   trelegy Ellipta  5)H/o Opioid Abuse--- patient repeatedly requesting IV pain medications,  6) leukocytosis--- WBC is 28.9, platelets of 533, suspect due to recent high-dose steroid use--- no fevers no chills no evidence of acute infection at this  time    Patient left AGAINST MEDICAL ADVICE just a few hours after being admitted  Please see full H&P from same date of service Thank you  Roxan Hockey, MD

## 2018-09-16 NOTE — ED Provider Notes (Signed)
58 yo female patient with hx of pulmonary fibrosis and recently d/c from hospital with PNA and complaining of worsening pleuritic chest and rib pain, and  shortness of breath for several days.  Pt was signed out to me by Abigail Butts, PA-C and end of shift with CT angio chest pending.      Ct Angio Chest Pe W And/or Wo Contrast  Result Date: 09/15/2018 CLINICAL DATA:  Pain and shortness of breath. EXAM: CT ANGIOGRAPHY CHEST WITH CONTRAST TECHNIQUE: Multidetector CT imaging of the chest was performed using the standard protocol during bolus administration of intravenous contrast. Multiplanar CT image reconstructions and MIPs were obtained to evaluate the vascular anatomy. CONTRAST:  18mL OMNIPAQUE IOHEXOL 350 MG/ML SOLN COMPARISON:  CT dated 07/27/2018 FINDINGS: Cardiovascular: Examination is severely limited by motion artifact. Heart size is enlarged. There is no large centrally located pulmonary embolus. Detection of smaller segmental and subsegmental pulmonary emboli is severely limited by respiratory motion artifact. Atherosclerotic changes are again noted of the thoracic aorta. Mediastinum/Nodes: There is an enlarged subcarinal lymph node measuring approximately 1.4 cm. There are additional prominent mediastinal and hilar lymph nodes. No significant axillary adenopathy. No significant supraclavicular adenopathy. Lungs/Pleura: Again noted is extensive pulmonary fibrosis bilaterally. There is no focal area of consolidation. There appears to be a few small areas of new pneumomediastinum, most notably about the lower esophagus and adjacent to the proximal left mainstem bronchus. Upper Abdomen: The patient appears to be status post prior coil embolization of the GDA. There is likely cholelithiasis. There is some mild diffuse wall thickening of the esophagus. There is an apparent new low-attenuation collection adjacent to the pancreas that appears to be centered within the lesser sac. Musculoskeletal:  Evaluation for fractures is severely limited by motion artifact. There are multiple suspicious areas involving the anterior left ribs and sternum which are favored to be secondary to motion artifact. Review of the MIP images confirms the above findings. IMPRESSION: 1. Evaluation is severely limited by motion artifact. 2. Given the limitations described above, no PE was identified on this exam. 3. Apparent new small volume pneumomediastinum. This may represent spontaneous pneumomediastinum in the setting of severe pulmonary fibrosis. Other differential considerations include traumatic causes of says and esophageal tear or tracheobronchial injury. 4. Diffuse wall thickening of the esophagus, similar to prior study. This may represent underlying esophagitis. 5. New low-attenuation structure anterior to the pancreatic body, likely centered within the lesser sac. This was not well appreciated on CT dated 07/27/2018. This could represent a small amount of free fluid or a pancreatic pseudocyst. Correlation with lipase is recommended. This can be followed up with a nonemergent outpatient CT of the abdomen. 6. Persistent but stable prominent mediastinal lymph nodes, likely reactive. 7. No pneumothorax.  Aortic Atherosclerosis (ICD10-I70.0). Electronically Signed   By: Constance Holster M.D.   On: 09/15/2018 23:46      CT angio chest negative for PE, but shows new small volume pneumomediastinum.  We will consult cardiothoracic surgery   0040  Consulted CT surgery, Dr. Servando Snare, who felt this was not a surgical issue at this point, but recommends pt have swallow study for evaluation of possible esophogeal tear.  Pt is stable at this time, O2 sat at 98% on 2L [place]. No reported vomiting, she is resting comfortably    Consulted hospitalist, Dr. Olevia Bowens and discussed findings, will admit pt for esophogeal study in am.    0140  Consulted radiologist, Dr. Constance Holster, for guidance on appropriate study and  he  recommends standard esophogram    Bevin, Das, PA-C 09/16/18 0147    Francine Graven, DO 09/19/18 1519

## 2018-09-16 NOTE — H&P (Signed)
Patient Demographics:    Christine Ortega, is a 58 y.o. female  MRN: [id number]   DOB - 10-19-1960  Admit Date - 09/15/2018  Outpatient Primary MD for the patient is Lucia Gaskins, MD   Assessment & Plan:    Principal Problem:   Pneumomediastinum ([place]) Active Problems:   Bipolar disorder ([place])   Schizophrenia, paranoid type ([place])   Thrombocytosis ([place])   History of DVT (deep vein thrombosis)   Normocytic anemia   GERD (gastroesophageal reflux disease)   1)Pneumomediastinum----CTA chest shows pneumo-mediastinum raising concerns about possible esophageal or tracheal bronchial tree injury---despite prolonged and repeated persuasion from myself, patient's RN Ms. Lowry Ram, patient's husband Lennette Bihari... Patient adamantly refuses to have water soluble esophagogram study to rule out esophageal tear, patient refuses transfer to [place] for possible cardiothoracic evaluation.  Patient insisted on leaving [place]. I called patient's husband again, he tried to persuade patient to stay patient adamant that she wants to go home patient's husband picked patient up and patient left AGAINST MEDICAL ADVICE... Patient has been advised to return or call 911 if significant shortness of breath chest pains, fevers, chills, or other concerns.  2)Schizophrenia, Paranoid Schizophrenia and Depressive Disorder--- during recent hospitalization last week she was evaluated by Tele- psychiatry on 09/12/2018----the psychiatrist recommended outpatient follow-up with patient's primary psychiatrist--- patient previously was going to [place]... Patient refuses to take Geodon or Depakote  3)H/o DVT--- no bleeding concerns at this time, patient has chronic anemia, hemoglobin is currently stable at 10.8 which is around patient's  baseline, c/n Apixaban  4)Pulm Fibrosis/COPD--- stable, continue   trelegy Ellipta  5)H/o Opioid Abuse--- patient repeatedly requesting IV pain medications,  6) leukocytosis--- WBC is 28.9, platelets of 533, suspect due to recent high-dose steroid use--- no fevers no chills no evidence of acute infection at this time   With History of - Reviewed by me  Past Medical History:  Diagnosis Date   ADD (attention deficit disorder)    Anxiety    Bipolar disorder ([place])    Chronic pain    Depression    Narcotic abuse in remission ([place])    Schizophrenia ([place])    Seizures ([place])       Past Surgical History:  Procedure Laterality Date   BACK SURGERY     ESOPHAGOGASTRODUODENOSCOPY N/A 05/19/2018   Procedure: ESOPHAGOGASTRODUODENOSCOPY (EGD);  Surgeon: Yetta Flock, MD;  Location: [facility] ENDOSCOPY;  Service: Gastroenterology;  Laterality: N/A;  egd at bedside in ICU, intubated   IR [place]  05/20/2018   IR ANGIOGRAM SELECTIVE EACH ADDITIONAL VESSEL  05/20/2018   IR ANGIOGRAM VISCERAL SELECTIVE  05/20/2018   IR ANGIOGRAM VISCERAL SELECTIVE  05/20/2018   IR EMBO ART  VEN HEMORR LYMPH EXTRAV  INC GUIDE ROADMAPPING  05/20/2018   IR US GUIDE VASC ACCESS LEFT  05/20/2018      Chief Complaint  Patient presents with   Shortness of Breath  HPI:    Christine Ortega  is a 58 y.o. female past medical history relevant for schizophrenia, paranoid schizophrenia anddepressive disorder, chronic pain syndrome, advanced pulmonary fibrosis/COPD with recurrent pneumonia, history of DVT on Eliquis, history of seizure    Who was admitted 09/07/2026 discharge 09/13/2018 for HCAP  CTA chest shows possible pneumo mediastinum raising concerns about possible esophageal or tracheal bronchial tree injury--- --Apparent new small volume pneumomediastinum. This may represent spontaneous pneumomediastinum in the setting of severe pulmonary fibrosis. Other  differential considerations include traumatic causes of says and esophageal tear or tracheobronchial injury. 4. Diffuse wall thickening of the esophagus, similar to prior study. This may represent underlying esophagitis.  No PE and No Pneumothorax---  WBC is 28.9, platelets of 533, suspect due to recent high-dose steroid use--- no fevers no chills no evidence of acute infection at this time EDP discussed case with on-call cardiothoracic surgeon at [facility] Dr. Servando Snare who recommended esophagogram  Despite prolonged and repeated persuasion from myself, patient's RN Ms. Lowry Ram, patient's husband Lennette Bihari... Patient adamantly refuses to have water soluble esophagogram study to rule out esophageal tear, patient refuses transfer to [place] for possible cardiothoracic evaluation.  Patient insisted on leaving [place]. I called patient's husband again, he tried to persuade patient to stay patient adamant that she wants to go home patient's husband picked patient up and patient left AGAINST MEDICAL ADVICE... Patient has been advised to return or call 911 if significant shortness of breath chest pains, fevers, chills, or other concerns.    Review of systems:    In addition to the HPI above,   A full Review of  Systems was done, all other systems reviewed are negative except as noted above in HPI , .    Social History:  Reviewed by me    Social History   Tobacco Use   Smoking status: Former Smoker    Packs/day: 0.50   Smokeless tobacco: Never Used  Substance Use Topics   Alcohol use: No       Family History :  Reviewed by me  HTN   Home Medications:   Prior to Admission medications   Medication Sig Start Date End Date Taking? Authorizing Provider  acetaminophen (TYLENOL) 500 MG tablet Take 1 tablet (500 mg total) by mouth every 8 (eight) hours as needed for moderate pain. 06/08/18 06/08/19 Yes Thurnell Lose, MD  apixaban (ELIQUIS) 5 MG TABS  tablet Take 1 tablet (5 mg total) by mouth 2 (two) times daily. 09/13/18  Yes Johnson, Clanford L, MD  benzonatate (TESSALON) 100 MG capsule Take 1 capsule (100 mg total) by mouth 3 (three) times daily as needed for cough. 09/13/18  Yes Johnson, Clanford L, MD  cetirizine (ZYRTEC) 10 MG tablet Take 10 mg by mouth daily.   Yes [provider]  doxycycline (VIBRA-TABS) 100 MG tablet Take 1 tablet (100 mg total) by mouth every 12 (twelve) hours for 5 days. 09/13/18 09/18/18 Yes Johnson, Clanford L, MD  FLUoxetine (PROZAC) 40 MG capsule Take 1 capsule (40 mg total) by mouth daily. For depression and anxiety. 08/08/11  Yes Mashburn, Marlane Hatcher, PA-C  Fluticasone-Umeclidin-Vilant (TRELEGY ELLIPTA) 100-62.5-25 MCG/INH AEPB Inhale 1 puff into the lungs daily.   Yes [provider]  guaiFENesin (MUCINEX) 600 MG 12 hr tablet Take 800 mg by mouth 2 (two) times daily.    Yes [provider]  Melatonin 5 MG CAPS Take 20 mg by mouth at bedtime.    Yes  [provider]  oxyCODONE (OXY IR/ROXICODONE) 5 MG immediate release tablet Take 0.5 tablets (2.5 mg total) by mouth every 6 (six) hours as needed for severe pain. 09/13/18  Yes Johnson, Clanford L, MD  pantoprazole (PROTONIX) 40 MG tablet Take 1 tablet (40 mg total) by mouth 2 (two) times daily. 06/08/18  Yes Thurnell Lose, MD  predniSONE (DELTASONE) 20 MG tablet Take 2 tablets (40 mg total) by mouth daily with breakfast for 7 days. 09/14/18 09/21/18 Yes Johnson, Clanford L, MD  Probiotic Product (PROBIOTIC PO) Take 1 tablet by mouth daily. Women's probiotic for GI health   Yes [provider]     Allergies:     Allergies  Allergen Reactions   Nsaids Other (See Comments)    Causes GI bleeding   Tramadol Hcl     Per Daughter- patient has " sleep paralysis"      Physical Exam:   Vitals  Blood pressure 131/69, pulse 96, temperature 97.9 F (36.6 C), temperature source Oral, resp. rate 17, height 5\' 3"  (1.6 m), weight  52.9 kg, SpO2 90 %.  Physical Examination: General appearance - alert, chronically ill appearing, and in no distress  Mental status - alert, oriented to person, place, and time,  Eyes - sclera anicteric Neck - supple, no JVD elevation , Chest -fair, symmetrical air movement, Velcro type rales Heart - S1 and S2 normal, regular  Abdomen - soft, nontender, nondistended, no masses or organomegaly Neurological - screening mental status exam normal, neck supple without rigidity, cranial nerves II through XII intact, DTR's normal and symmetric Extremities - no pedal edema noted, intact peripheral pulses  Skin - warm, dry     Data Review:    CBC Recent Labs  Lab 09/10/18 0556 09/11/18 0607 09/12/18 0504 09/15/18 2048 09/16/18 0652  WBC 24.9* 23.2* 22.5* 29.0* 28.9*  HGB 11.1* 11.1* 11.2* 11.5* 10.8*  HCT 34.9* 35.0* 35.7* 36.1 33.4*  PLT 554* 496* 542* 593* 533*  MCV 96.7 97.5 96.5 96.3 96.8  MCH 30.7 30.9 30.3 30.7 31.3  MCHC 31.8 31.7 31.4 31.9 32.3  RDW 14.4 14.2 14.0 14.8 14.8  LYMPHSABS 1.2 2.0 1.8 2.4 4.8*  MONOABS 1.4* 1.8* 0.9 1.4* 1.9*  EOSABS 0.0 0.0 0.1 0.0 0.0  BASOSABS 0.0 0.0 0.0 0.1 0.1   ------------------------------------------------------------------------------------------------------------------  Chemistries  Recent Labs  Lab 09/10/18 0556 09/11/18 0607 09/12/18 0504 09/15/18 2048  NA 139 141 140 140  K 2.7* 3.5 4.1 4.7  CL 100 106 102 102  CO2 26 26 28 23   GLUCOSE 147* 170* 125* 127*  BUN 11 10 9 14   CREATININE 0.60 0.53 0.46 0.54  CALCIUM 9.2 9.0 9.0 9.1  MG 2.0 2.0 2.0  --   AST 34 22 17  --   ALT 16 16 15   --   ALKPHOS 101 88 92  --   BILITOT 0.4 0.6 0.5  --    ------------------------------------------------------------------------------------------------------------------ estimated creatinine clearance is 64.2 mL/min (by C-G formula based on SCr of 0.54  mg/dL). ------------------------------------------------------------------------------------------------------------------ No results for input(s): TSH, T4TOTAL, T3FREE, THYROIDAB in the last 72 hours.  Invalid input(s): FREET3   Coagulation profile No results for input(s): INR, PROTIME in the last 168 hours. ------------------------------------------------------------------------------------------------------------------- No results for input(s): DDIMER in the last 72 hours. -------------------------------------------------------------------------------------------------------------------  Cardiac Enzymes Recent Labs  Lab 09/15/18 2048  TROPONINI <0.03   ------------------------------------------------------------------------------------------------------------------    Component Value Date/Time   BNP 69.0 09/07/2018 1556     ---------------------------------------------------------------------------------------------------------------  Urinalysis  Component Value Date/Time   COLORURINE YELLOW 05/19/2018 0921   APPEARANCEUR CLOUDY (A) 05/19/2018 0921   LABSPEC 1.008 05/19/2018 0921   PHURINE 6.0 05/19/2018 0921   GLUCOSEU NEGATIVE 05/19/2018 0921   HGBUR SMALL (A) 05/19/2018 0921   BILIRUBINUR NEGATIVE 05/19/2018 0921   KETONESUR NEGATIVE 05/19/2018 0921   PROTEINUR 30 (A) 05/19/2018 0921   NITRITE NEGATIVE 05/19/2018 0921   LEUKOCYTESUR LARGE (A) 05/19/2018 0921    ----------------------------------------------------------------------------------------------------------------   Imaging Results:    Ct Angio Chest Pe W And/or Wo Contrast  Result Date: 09/15/2018 CLINICAL DATA:  Pain and shortness of breath. EXAM: CT ANGIOGRAPHY CHEST WITH CONTRAST TECHNIQUE: Multidetector CT imaging of the chest was performed using the standard protocol during bolus administration of intravenous contrast. Multiplanar CT image reconstructions and MIPs were obtained to evaluate the  vascular anatomy. CONTRAST:  43mL OMNIPAQUE IOHEXOL 350 MG/ML SOLN COMPARISON:  CT dated 07/27/2018 FINDINGS: Cardiovascular: Examination is severely limited by motion artifact. Heart size is enlarged. There is no large centrally located pulmonary embolus. Detection of smaller segmental and subsegmental pulmonary emboli is severely limited by respiratory motion artifact. Atherosclerotic changes are again noted of the thoracic aorta. Mediastinum/Nodes: There is an enlarged subcarinal lymph node measuring approximately 1.4 cm. There are additional prominent mediastinal and hilar lymph nodes. No significant axillary adenopathy. No significant supraclavicular adenopathy. Lungs/Pleura: Again noted is extensive pulmonary fibrosis bilaterally. There is no focal area of consolidation. There appears to be a few small areas of new pneumomediastinum, most notably about the lower esophagus and adjacent to the proximal left mainstem bronchus. Upper Abdomen: The patient appears to be status post prior coil embolization of the GDA. There is likely cholelithiasis. There is some mild diffuse wall thickening of the esophagus. There is an apparent new low-attenuation collection adjacent to the pancreas that appears to be centered within the lesser sac. Musculoskeletal: Evaluation for fractures is severely limited by motion artifact. There are multiple suspicious areas involving the anterior left ribs and sternum which are favored to be secondary to motion artifact. Review of the MIP images confirms the above findings. IMPRESSION: 1. Evaluation is severely limited by motion artifact. 2. Given the limitations described above, no PE was identified on this exam. 3. Apparent new small volume pneumomediastinum. This may represent spontaneous pneumomediastinum in the setting of severe pulmonary fibrosis. Other differential considerations include traumatic causes of says and esophageal tear or tracheobronchial injury. 4. Diffuse wall  thickening of the esophagus, similar to prior study. This may represent underlying esophagitis. 5. New low-attenuation structure anterior to the pancreatic body, likely centered within the lesser sac. This was not well appreciated on CT dated 07/27/2018. This could represent a small amount of free fluid or a pancreatic pseudocyst. Correlation with lipase is recommended. This can be followed up with a nonemergent outpatient CT of the abdomen. 6. Persistent but stable prominent mediastinal lymph nodes, likely reactive. 7. No pneumothorax.  Aortic Atherosclerosis (ICD10-I70.0). Electronically Signed   By: Constance Holster M.D.   On: 09/15/2018 23:46   Dg Chest Port 1 View  Result Date: 09/15/2018 CLINICAL DATA:  Shortness of breath EXAM: PORTABLE CHEST 1 VIEW COMPARISON:  09/12/2018 FINDINGS: Again identified are coarsened reticular airspace opacities throughout both lung fields. This appearance is similar to multiple prior studies. There is no new focal area of consolidation. The heart size is unchanged. There is no acute osseous abnormality. IMPRESSION: Stable appearance of the chest with no new focal abnormality. Electronically Signed   By: Jamie Kato.D.  On: 09/15/2018 21:24    Radiological Exams on Admission: Ct Angio Chest Pe W And/or Wo Contrast  Result Date: 09/15/2018 CLINICAL DATA:  Pain and shortness of breath. EXAM: CT ANGIOGRAPHY CHEST WITH CONTRAST TECHNIQUE: Multidetector CT imaging of the chest was performed using the standard protocol during bolus administration of intravenous contrast. Multiplanar CT image reconstructions and MIPs were obtained to evaluate the vascular anatomy. CONTRAST:  15mL OMNIPAQUE IOHEXOL 350 MG/ML SOLN COMPARISON:  CT dated 07/27/2018 FINDINGS: Cardiovascular: Examination is severely limited by motion artifact. Heart size is enlarged. There is no large centrally located pulmonary embolus. Detection of smaller segmental and subsegmental pulmonary emboli is  severely limited by respiratory motion artifact. Atherosclerotic changes are again noted of the thoracic aorta. Mediastinum/Nodes: There is an enlarged subcarinal lymph node measuring approximately 1.4 cm. There are additional prominent mediastinal and hilar lymph nodes. No significant axillary adenopathy. No significant supraclavicular adenopathy. Lungs/Pleura: Again noted is extensive pulmonary fibrosis bilaterally. There is no focal area of consolidation. There appears to be a few small areas of new pneumomediastinum, most notably about the lower esophagus and adjacent to the proximal left mainstem bronchus. Upper Abdomen: The patient appears to be status post prior coil embolization of the GDA. There is likely cholelithiasis. There is some mild diffuse wall thickening of the esophagus. There is an apparent new low-attenuation collection adjacent to the pancreas that appears to be centered within the lesser sac. Musculoskeletal: Evaluation for fractures is severely limited by motion artifact. There are multiple suspicious areas involving the anterior left ribs and sternum which are favored to be secondary to motion artifact. Review of the MIP images confirms the above findings. IMPRESSION: 1. Evaluation is severely limited by motion artifact. 2. Given the limitations described above, no PE was identified on this exam. 3. Apparent new small volume pneumomediastinum. This may represent spontaneous pneumomediastinum in the setting of severe pulmonary fibrosis. Other differential considerations include traumatic causes of says and esophageal tear or tracheobronchial injury. 4. Diffuse wall thickening of the esophagus, similar to prior study. This may represent underlying esophagitis. 5. New low-attenuation structure anterior to the pancreatic body, likely centered within the lesser sac. This was not well appreciated on CT dated 07/27/2018. This could represent a small amount of free fluid or a pancreatic pseudocyst.  Correlation with lipase is recommended. This can be followed up with a nonemergent outpatient CT of the abdomen. 6. Persistent but stable prominent mediastinal lymph nodes, likely reactive. 7. No pneumothorax.  Aortic Atherosclerosis (ICD10-I70.0). Electronically Signed   By: Constance Holster M.D.   On: 09/15/2018 23:46   Dg Chest Port 1 View  Result Date: 09/15/2018 CLINICAL DATA:  Shortness of breath EXAM: PORTABLE CHEST 1 VIEW COMPARISON:  09/12/2018 FINDINGS: Again identified are coarsened reticular airspace opacities throughout both lung fields. This appearance is similar to multiple prior studies. There is no new focal area of consolidation. The heart size is unchanged. There is no acute osseous abnormality. IMPRESSION: Stable appearance of the chest with no new focal abnormality. Electronically Signed   By: Constance Holster M.D.   On: 09/15/2018 21:24    DVT Prophylaxis -SCD   AM Labs Ordered, also please review Full Orders  Family Communication: Admission, patients condition and plan of care including tests being ordered have been discussed with the patient and husband who indicate understanding and agree with the plan   Code Status - Full Code  Likely DC to  TBD  Condition   stable  Roxan Hockey M.D on  09/16/2018 at 10:29 AM Go to [email or web address] -  for contact info  [facility] - Office  [phone]

## 2018-09-16 NOTE — ED Notes (Signed)
Attempt to call report x 1  

## 2018-09-16 NOTE — Progress Notes (Addendum)
After hearing explanation for need to go to [facility] for esophogram, patient refused to be transferred.  Much time was spent talking with her and her husband by Dr. Denton Brick and situation was explained clearly at least four times  as to why she needed to go and that there was no medical reason for her to stay here.  Husband was placed on speaker phone and he tried to reason with patient and encouraged her to transfer but patient just yelled at him and continued to refuse advice.   IV removed and husband called to transport home.  Refused to sign AMA papers, this was noted on AMA paper and placed in chart.

## 2018-09-16 NOTE — ED Notes (Signed)
edp to room  

## 2018-09-17 DIAGNOSIS — R000 Tachycardia, unspecified: Secondary | ICD-10-CM | POA: Diagnosis not present

## 2018-09-17 DIAGNOSIS — Z8639 Personal history of other endocrine, nutritional and metabolic disease: Secondary | ICD-10-CM | POA: Diagnosis not present

## 2018-09-17 DIAGNOSIS — M810 Age-related osteoporosis without current pathological fracture: Secondary | ICD-10-CM | POA: Diagnosis not present

## 2018-09-17 DIAGNOSIS — S2243XS Multiple fractures of ribs, bilateral, sequela: Secondary | ICD-10-CM | POA: Diagnosis not present

## 2018-09-17 DIAGNOSIS — D473 Essential (hemorrhagic) thrombocythemia: Secondary | ICD-10-CM | POA: Diagnosis not present

## 2018-09-20 ENCOUNTER — Other Ambulatory Visit: Payer: Self-pay

## 2018-09-20 ENCOUNTER — Emergency Department (HOSPITAL_COMMUNITY): Payer: Medicare Other

## 2018-09-20 ENCOUNTER — Encounter (HOSPITAL_COMMUNITY): Payer: Self-pay

## 2018-09-20 ENCOUNTER — Inpatient Hospital Stay (HOSPITAL_COMMUNITY)
Admission: EM | Admit: 2018-09-20 | Discharge: 2018-10-03 | DRG: 871 | Disposition: E | Payer: Medicare Other | Attending: Internal Medicine | Admitting: Internal Medicine

## 2018-09-20 DIAGNOSIS — D75839 Thrombocytosis, unspecified: Secondary | ICD-10-CM | POA: Diagnosis present

## 2018-09-20 DIAGNOSIS — K219 Gastro-esophageal reflux disease without esophagitis: Secondary | ICD-10-CM | POA: Diagnosis not present

## 2018-09-20 DIAGNOSIS — Z681 Body mass index (BMI) 19 or less, adult: Secondary | ICD-10-CM | POA: Diagnosis not present

## 2018-09-20 DIAGNOSIS — Z515 Encounter for palliative care: Secondary | ICD-10-CM | POA: Diagnosis not present

## 2018-09-20 DIAGNOSIS — F319 Bipolar disorder, unspecified: Secondary | ICD-10-CM | POA: Diagnosis present

## 2018-09-20 DIAGNOSIS — J982 Interstitial emphysema: Secondary | ICD-10-CM | POA: Diagnosis present

## 2018-09-20 DIAGNOSIS — Z66 Do not resuscitate: Secondary | ICD-10-CM | POA: Diagnosis not present

## 2018-09-20 DIAGNOSIS — A419 Sepsis, unspecified organism: Secondary | ICD-10-CM | POA: Diagnosis not present

## 2018-09-20 DIAGNOSIS — Z20828 Contact with and (suspected) exposure to other viral communicable diseases: Secondary | ICD-10-CM | POA: Diagnosis present

## 2018-09-20 DIAGNOSIS — D473 Essential (hemorrhagic) thrombocythemia: Secondary | ICD-10-CM | POA: Diagnosis not present

## 2018-09-20 DIAGNOSIS — Z7189 Other specified counseling: Secondary | ICD-10-CM | POA: Diagnosis not present

## 2018-09-20 DIAGNOSIS — F419 Anxiety disorder, unspecified: Secondary | ICD-10-CM | POA: Diagnosis present

## 2018-09-20 DIAGNOSIS — G8929 Other chronic pain: Secondary | ICD-10-CM | POA: Diagnosis present

## 2018-09-20 DIAGNOSIS — Z7901 Long term (current) use of anticoagulants: Secondary | ICD-10-CM

## 2018-09-20 DIAGNOSIS — K228 Other specified diseases of esophagus: Secondary | ICD-10-CM | POA: Diagnosis not present

## 2018-09-20 DIAGNOSIS — Z8701 Personal history of pneumonia (recurrent): Secondary | ICD-10-CM | POA: Diagnosis not present

## 2018-09-20 DIAGNOSIS — R64 Cachexia: Secondary | ICD-10-CM | POA: Diagnosis not present

## 2018-09-20 DIAGNOSIS — I493 Ventricular premature depolarization: Secondary | ICD-10-CM | POA: Diagnosis not present

## 2018-09-20 DIAGNOSIS — F200 Paranoid schizophrenia: Secondary | ICD-10-CM | POA: Diagnosis present

## 2018-09-20 DIAGNOSIS — J189 Pneumonia, unspecified organism: Secondary | ICD-10-CM | POA: Diagnosis not present

## 2018-09-20 DIAGNOSIS — R079 Chest pain, unspecified: Secondary | ICD-10-CM

## 2018-09-20 DIAGNOSIS — Z79899 Other long term (current) drug therapy: Secondary | ICD-10-CM

## 2018-09-20 DIAGNOSIS — Z86718 Personal history of other venous thrombosis and embolism: Secondary | ICD-10-CM

## 2018-09-20 DIAGNOSIS — G40909 Epilepsy, unspecified, not intractable, without status epilepticus: Secondary | ICD-10-CM | POA: Diagnosis present

## 2018-09-20 DIAGNOSIS — Z886 Allergy status to analgesic agent status: Secondary | ICD-10-CM

## 2018-09-20 DIAGNOSIS — Z9981 Dependence on supplemental oxygen: Secondary | ICD-10-CM | POA: Diagnosis not present

## 2018-09-20 DIAGNOSIS — F988 Other specified behavioral and emotional disorders with onset usually occurring in childhood and adolescence: Secondary | ICD-10-CM | POA: Diagnosis present

## 2018-09-20 DIAGNOSIS — K668 Other specified disorders of peritoneum: Secondary | ICD-10-CM | POA: Diagnosis not present

## 2018-09-20 DIAGNOSIS — Z885 Allergy status to narcotic agent status: Secondary | ICD-10-CM

## 2018-09-20 DIAGNOSIS — J8410 Pulmonary fibrosis, unspecified: Secondary | ICD-10-CM

## 2018-09-20 DIAGNOSIS — Z87891 Personal history of nicotine dependence: Secondary | ICD-10-CM | POA: Diagnosis not present

## 2018-09-20 DIAGNOSIS — J9621 Acute and chronic respiratory failure with hypoxia: Secondary | ICD-10-CM | POA: Diagnosis present

## 2018-09-20 DIAGNOSIS — Z79890 Hormone replacement therapy (postmenopausal): Secondary | ICD-10-CM

## 2018-09-20 DIAGNOSIS — Z9119 Patient's noncompliance with other medical treatment and regimen: Secondary | ICD-10-CM | POA: Diagnosis not present

## 2018-09-20 DIAGNOSIS — J849 Interstitial pulmonary disease, unspecified: Secondary | ICD-10-CM

## 2018-09-20 DIAGNOSIS — J449 Chronic obstructive pulmonary disease, unspecified: Secondary | ICD-10-CM

## 2018-09-20 DIAGNOSIS — R0602 Shortness of breath: Secondary | ICD-10-CM | POA: Diagnosis not present

## 2018-09-20 DIAGNOSIS — K859 Acute pancreatitis without necrosis or infection, unspecified: Secondary | ICD-10-CM | POA: Diagnosis not present

## 2018-09-20 HISTORY — DX: Chronic obstructive pulmonary disease, unspecified: J44.9

## 2018-09-20 LAB — LIPASE, BLOOD: Lipase: 28 U/L (ref 11–51)

## 2018-09-20 LAB — CBC WITH DIFFERENTIAL/PLATELET
Abs Immature Granulocytes: 0.13 10*3/uL — ABNORMAL HIGH (ref 0.00–0.07)
Basophils Absolute: 0 10*3/uL (ref 0.0–0.1)
Basophils Relative: 0 %
Eosinophils Absolute: 0.7 10*3/uL — ABNORMAL HIGH (ref 0.0–0.5)
Eosinophils Relative: 3 %
HCT: 36.9 % (ref 36.0–46.0)
Hemoglobin: 11.8 g/dL — ABNORMAL LOW (ref 12.0–15.0)
Immature Granulocytes: 1 %
Lymphocytes Relative: 12 %
Lymphs Abs: 2.5 10*3/uL (ref 0.7–4.0)
MCH: 30.6 pg (ref 26.0–34.0)
MCHC: 32 g/dL (ref 30.0–36.0)
MCV: 95.8 fL (ref 80.0–100.0)
Monocytes Absolute: 1 10*3/uL (ref 0.1–1.0)
Monocytes Relative: 5 %
Neutro Abs: 16.1 10*3/uL — ABNORMAL HIGH (ref 1.7–7.7)
Neutrophils Relative %: 79 %
Platelets: 497 10*3/uL — ABNORMAL HIGH (ref 150–400)
RBC: 3.85 MIL/uL — ABNORMAL LOW (ref 3.87–5.11)
RDW: 14.7 % (ref 11.5–15.5)
WBC: 20.5 10*3/uL — ABNORMAL HIGH (ref 4.0–10.5)
nRBC: 0 % (ref 0.0–0.2)

## 2018-09-20 LAB — PROTIME-INR
INR: 1 (ref 0.8–1.2)
Prothrombin Time: 13.4 seconds (ref 11.4–15.2)

## 2018-09-20 LAB — BASIC METABOLIC PANEL
Anion gap: 10 (ref 5–15)
BUN: 6 mg/dL (ref 6–20)
CO2: 27 mmol/L (ref 22–32)
Calcium: 8.7 mg/dL — ABNORMAL LOW (ref 8.9–10.3)
Chloride: 100 mmol/L (ref 98–111)
Creatinine, Ser: 0.51 mg/dL (ref 0.44–1.00)
GFR calc Af Amer: >60 mL/min (ref >60)
GFR calc non Af Amer: >60 mL/min (ref >60)
Glucose, Bld: 103 mg/dL — ABNORMAL HIGH (ref 70–99)
Potassium: 3.7 mmol/L (ref 3.5–5.1)
Sodium: 137 mmol/L (ref 135–145)

## 2018-09-20 LAB — APTT: aPTT: 33 seconds (ref 24–36)

## 2018-09-20 LAB — BRAIN NATRIURETIC PEPTIDE: B Natriuretic Peptide: 57.8 pg/mL (ref 0.0–100.0)

## 2018-09-20 LAB — LACTIC ACID, PLASMA: Lactic Acid, Venous: 1 mmol/L (ref 0.5–1.9)

## 2018-09-20 MED ORDER — ACETAMINOPHEN 325 MG PO TABS
650.0000 mg | ORAL_TABLET | Freq: Four times a day (QID) | ORAL | Status: DC | PRN
  Administered 2018-09-20 – 2018-09-22 (×3): 650 mg via ORAL
  Filled 2018-09-20 (×3): qty 2

## 2018-09-20 MED ORDER — MELATONIN 3 MG PO TABS
18.0000 mg | ORAL_TABLET | Freq: Every day | ORAL | Status: DC
  Administered 2018-09-20 – 2018-09-22 (×3): 18 mg via ORAL
  Filled 2018-09-20 (×4): qty 6

## 2018-09-20 MED ORDER — BENZONATATE 100 MG PO CAPS
100.0000 mg | ORAL_CAPSULE | Freq: Three times a day (TID) | ORAL | Status: DC | PRN
  Administered 2018-09-20 – 2018-09-23 (×9): 100 mg via ORAL
  Filled 2018-09-20 (×9): qty 1

## 2018-09-20 MED ORDER — RISAQUAD PO CAPS
ORAL_CAPSULE | Freq: Every day | ORAL | Status: DC
  Administered 2018-09-21 – 2018-09-23 (×3): 1 via ORAL
  Filled 2018-09-20 (×4): qty 1

## 2018-09-20 MED ORDER — HYDROMORPHONE HCL 1 MG/ML IJ SOLN
1.0000 mg | INTRAMUSCULAR | Status: DC | PRN
  Administered 2018-09-20 – 2018-09-21 (×4): 1 mg via INTRAVENOUS
  Filled 2018-09-20 (×4): qty 1

## 2018-09-20 MED ORDER — ONDANSETRON HCL 4 MG/2ML IJ SOLN: 4.0000 mg | Freq: Four times a day (QID) | INTRAMUSCULAR | Status: DC | PRN

## 2018-09-20 MED ORDER — FLUTICASONE-UMECLIDIN-VILANT 100-62.5-25 MCG/INH IN AEPB: 1.0000 | INHALATION_SPRAY | Freq: Every day | RESPIRATORY_TRACT | Status: DC

## 2018-09-20 MED ORDER — PIPERACILLIN-TAZOBACTAM 3.375 G IVPB
3.3750 g | Freq: Three times a day (TID) | INTRAVENOUS | Status: DC
  Administered 2018-09-20 – 2018-09-24 (×12): 3.375 g via INTRAVENOUS
  Filled 2018-09-20 (×19): qty 50

## 2018-09-20 MED ORDER — PREDNISONE 20 MG PO TABS
40.0000 mg | ORAL_TABLET | Freq: Every day | ORAL | Status: DC
  Administered 2018-09-21 – 2018-09-24 (×4): 40 mg via ORAL
  Filled 2018-09-20 (×4): qty 2

## 2018-09-20 MED ORDER — FLUTICASONE FUROATE-VILANTEROL 100-25 MCG/INH IN AEPB
1.0000 | INHALATION_SPRAY | Freq: Every day | RESPIRATORY_TRACT | Status: DC
  Administered 2018-09-21: 1 via RESPIRATORY_TRACT
  Filled 2018-09-20: qty 28

## 2018-09-20 MED ORDER — BENZONATATE 100 MG PO CAPS
100.0000 mg | ORAL_CAPSULE | Freq: Once | ORAL | Status: AC
  Administered 2018-09-20: 11:00:00 100 mg via ORAL
  Filled 2018-09-20: qty 1

## 2018-09-20 MED ORDER — APIXABAN 5 MG PO TABS
5.0000 mg | ORAL_TABLET | Freq: Two times a day (BID) | ORAL | Status: DC
  Administered 2018-09-20 – 2018-09-23 (×7): 5 mg via ORAL
  Filled 2018-09-20 (×8): qty 1

## 2018-09-20 MED ORDER — PIPERACILLIN-TAZOBACTAM 3.375 G IVPB 30 MIN: 3.3750 g | Freq: Three times a day (TID) | INTRAVENOUS | Status: DC

## 2018-09-20 MED ORDER — SODIUM CHLORIDE 0.9 % IV SOLN
INTRAVENOUS | Status: DC
  Administered 2018-09-20 – 2018-09-21 (×3): via INTRAVENOUS

## 2018-09-20 MED ORDER — OXYCODONE-ACETAMINOPHEN 5-325 MG PO TABS
1.0000 | ORAL_TABLET | Freq: Four times a day (QID) | ORAL | Status: DC | PRN
  Administered 2018-09-20 – 2018-09-21 (×2): 1 via ORAL
  Filled 2018-09-20 (×2): qty 1

## 2018-09-20 MED ORDER — SODIUM CHLORIDE 0.9% FLUSH
3.0000 mL | Freq: Two times a day (BID) | INTRAVENOUS | Status: DC
  Administered 2018-09-20 – 2018-09-27 (×11): 3 mL via INTRAVENOUS

## 2018-09-20 MED ORDER — UMECLIDINIUM BROMIDE 62.5 MCG/INH IN AEPB
1.0000 | INHALATION_SPRAY | Freq: Every day | RESPIRATORY_TRACT | Status: DC
  Administered 2018-09-21 – 2018-09-24 (×3): 1 via RESPIRATORY_TRACT
  Filled 2018-09-20: qty 7

## 2018-09-20 MED ORDER — HYDROMORPHONE HCL 1 MG/ML IJ SOLN
1.0000 mg | INTRAMUSCULAR | Status: DC | PRN
  Administered 2018-09-20: 1 mg via INTRAVENOUS
  Filled 2018-09-20: qty 1

## 2018-09-20 MED ORDER — MORPHINE SULFATE (PF) 4 MG/ML IV SOLN
6.0000 mg | Freq: Once | INTRAVENOUS | Status: AC
  Administered 2018-09-20: 10:00:00 6 mg via INTRAVENOUS
  Filled 2018-09-20: qty 2

## 2018-09-20 MED ORDER — OXYCODONE-ACETAMINOPHEN 5-325 MG PO TABS: 1.0000 | ORAL_TABLET | ORAL | Status: DC | PRN

## 2018-09-20 MED ORDER — PANTOPRAZOLE SODIUM 40 MG IV SOLR
40.0000 mg | Freq: Two times a day (BID) | INTRAVENOUS | Status: DC
  Administered 2018-09-20: 40 mg via INTRAVENOUS
  Filled 2018-09-20: qty 40

## 2018-09-20 MED ORDER — ALBUTEROL SULFATE (2.5 MG/3ML) 0.083% IN NEBU
2.5000 mg | INHALATION_SOLUTION | Freq: Four times a day (QID) | RESPIRATORY_TRACT | Status: DC | PRN
  Administered 2018-09-21 (×2): 2.5 mg via RESPIRATORY_TRACT
  Filled 2018-09-20 (×2): qty 3

## 2018-09-20 MED ORDER — MORPHINE SULFATE (PF) 2 MG/ML IV SOLN
6.0000 mg | Freq: Once | INTRAVENOUS | Status: AC
  Administered 2018-09-20: 14:00:00 6 mg via INTRAVENOUS
  Filled 2018-09-20: qty 3

## 2018-09-20 MED ORDER — PANTOPRAZOLE SODIUM 40 MG PO TBEC
40.0000 mg | DELAYED_RELEASE_TABLET | Freq: Two times a day (BID) | ORAL | Status: DC
  Administered 2018-09-20 – 2018-09-23 (×7): 40 mg via ORAL
  Filled 2018-09-20 (×8): qty 1

## 2018-09-20 MED ORDER — IOHEXOL 350 MG/ML SOLN
60.0000 mL | Freq: Once | INTRAVENOUS | Status: AC | PRN
  Administered 2018-09-20: 60 mL via INTRAVENOUS

## 2018-09-20 MED ORDER — OXYCODONE-ACETAMINOPHEN 5-325 MG PO TABS
1.0000 | ORAL_TABLET | Freq: Once | ORAL | Status: AC
  Administered 2018-09-20: 12:00:00 1 via ORAL
  Filled 2018-09-20: qty 1

## 2018-09-20 MED ORDER — IOHEXOL 300 MG/ML  SOLN
150.0000 mL | Freq: Once | INTRAMUSCULAR | Status: AC | PRN
  Administered 2018-09-20: 100 mL via ORAL

## 2018-09-20 MED ORDER — FLUOXETINE HCL 20 MG PO CAPS
40.0000 mg | ORAL_CAPSULE | Freq: Every day | ORAL | Status: DC
  Administered 2018-09-20 – 2018-09-23 (×4): 40 mg via ORAL
  Filled 2018-09-20 (×5): qty 2

## 2018-09-20 MED ORDER — ONDANSETRON HCL 4 MG PO TABS: 4.0000 mg | ORAL_TABLET | Freq: Four times a day (QID) | ORAL | Status: DC | PRN

## 2018-09-20 NOTE — ED Notes (Signed)
ED Provider at bedside. 

## 2018-09-20 NOTE — ED Notes (Signed)
Patient transported to CT 

## 2018-09-20 NOTE — ED Notes (Signed)
purwick in place pt..has voided position up in the bed

## 2018-09-20 NOTE — H&P (Signed)
History and Physical    TRESEA HEINE KNL:[id number] DOB: Mar 07, 1961 DOA: 09/12/2018  Referring MD/NP/PA: Domenic Moras, PA-C PCP: Lucia Gaskins, MD  Patient coming from: home  Chief Complaint: Chest pain  I have personally briefly reviewed patient's old medical records in [place]   HPI: Christine Ortega is a 58 y.o. female with medical history significant of COPD oxygen dependent on 3 L, schizophrenia, bipolar disorder, depression, chronic pain, DVT on Eliquis; who presents with complaints of chest pain. Patient had just been hospitalized from 6/5-6/11 for healthcare associated pneumonia.  Then returned to the hospital on 6/13 for worsening shortness of breath for which CT angiogram revealed pneumomediastinum.  However, patient refused to have soluble esophagogram study and ultimately left AGAINST MEDICAL ADVICE on 6/14.  Since leaving the hospital patient reports that she has had progressively worsening pain in her chest and back.  She continues to shortness of breath and cough that is intermittently productive of thick white sputum.  Denies having any fever, chills, nausea, or vomiting.  ED Course: Upon admission into the emergency department patient was seen to be afebrile, pulse 83-119, respirations 19-30, blood pressures 127/93-153/103, and O2 saturations 89 to 100% on 3 L of nasal cannula oxygen.  Labs revealed WBC 20.5, hemoglobin 11.8, and platelets 497.  Chest x-ray showed fibrotic changes in the lungs right greater than left, but no other interval changes.  CT angiogram of the chest did reveal interval increase in pneumomediastinum, extensive interstitial fibrosis, and clinical findings consistent with pancreatitis.  Patient had been given oxycodone 5-325 mg, morphine 6mg  IV, and Tessalon 100 mg orally.  TR[facility] called to admit.  Negative for COVID 4 days ago.  Review of Systems  Constitutional: Positive for malaise/fatigue. Negative for chills and fever.  HENT: Negative for  congestion and nosebleeds.   Eyes: Negative for double vision and photophobia.  Respiratory: Positive for cough, sputum production and shortness of breath.   Cardiovascular: Positive for chest pain. Negative for leg swelling.  Gastrointestinal: Negative for abdominal pain, diarrhea, nausea and vomiting.  Genitourinary: Negative for dysuria and hematuria.  Musculoskeletal: Positive for back pain.  Skin: Negative for itching and rash.  Neurological: Negative for focal weakness and loss of consciousness.  Psychiatric/Behavioral: Negative for memory loss. The patient is nervous/anxious.     Past Medical History:  Diagnosis Date  . ADD (attention deficit disorder)   . Anxiety   . Bipolar disorder ([place])   . Chronic pain   . COPD (chronic obstructive pulmonary disease) ([place])   . Depression   . Narcotic abuse in remission ([place])   . Schizophrenia ([place])   . Seizures ([place])     Past Surgical History:  Procedure Laterality Date  . BACK SURGERY    . ESOPHAGOGASTRODUODENOSCOPY N/A 05/19/2018   Procedure: ESOPHAGOGASTRODUODENOSCOPY (EGD);  Surgeon: Yetta Flock, MD;  [facility]: [facility] ENDOSCOPY;  Service: Gastroenterology;  Laterality: N/A;  egd at bedside in ICU, intubated  . IR ANGIOGRAM SELECTIVE EACH ADDITIONAL VESSEL  05/20/2018  . IR ANGIOGRAM SELECTIVE EACH ADDITIONAL VESSEL  05/20/2018  . IR ANGIOGRAM VISCERAL SELECTIVE  05/20/2018  . IR ANGIOGRAM VISCERAL SELECTIVE  05/20/2018  . IR EMBO ART  VEN HEMORR LYMPH EXTRAV  INC GUIDE ROADMAPPING  05/20/2018  . IR US GUIDE VASC ACCESS LEFT  05/20/2018     reports that she has quit smoking. She smoked 0.50 packs per day. She has never used smokeless tobacco. She reports that she does not drink alcohol or use drugs.  Allergies  Allergen Reactions  . Nsaids Other (See Comments)    Causes GI bleeding  . Tramadol Hcl     Per Daughter- patient has " sleep paralysis"     History reviewed. No pertinent family history.  Prior to Admission  medications   Medication Sig Start Date End Date Taking? Authorizing Provider  acetaminophen (TYLENOL) 500 MG tablet Take 1 tablet (500 mg total) by mouth every 8 (eight) hours as needed for moderate pain. 06/08/18 06/08/19 Yes Thurnell Lose, MD  apixaban (ELIQUIS) 5 MG TABS tablet Take 1 tablet (5 mg total) by mouth 2 (two) times daily. 09/13/18  Yes Johnson, Clanford L, MD  benzonatate (TESSALON) 100 MG capsule Take 1 capsule (100 mg total) by mouth 3 (three) times daily as needed for cough. 09/13/18  Yes Johnson, Clanford L, MD  cetirizine (ZYRTEC) 10 MG tablet Take 10 mg by mouth daily.   Yes [provider]  FLUoxetine (PROZAC) 40 MG capsule Take 1 capsule (40 mg total) by mouth daily. For depression and anxiety. 08/08/11  Yes Mashburn, Marlane Hatcher, PA-C  Fluticasone-Umeclidin-Vilant (TRELEGY ELLIPTA) 100-62.5-25 MCG/INH AEPB Inhale 1 puff into the lungs daily.   Yes [provider]  guaiFENesin (MUCINEX) 600 MG 12 hr tablet Take 800 mg by mouth 2 (two) times daily.    Yes [provider]  Melatonin 5 MG CAPS Take 20 mg by mouth at bedtime.    Yes [provider]  oxyCODONE (OXY IR/ROXICODONE) 5 MG immediate release tablet Take 0.5 tablets (2.5 mg total) by mouth every 6 (six) hours as needed for severe pain. 09/13/18  Yes Johnson, Clanford L, MD  OXYGEN Inhale 3 L into the lungs continuous.   Yes [provider]  pantoprazole (PROTONIX) 40 MG tablet Take 1 tablet (40 mg total) by mouth 2 (two) times daily. 06/08/18  Yes Thurnell Lose, MD  predniSONE (DELTASONE) 20 MG tablet Take 2 tablets (40 mg total) by mouth daily with breakfast for 7 days. 09/14/18 09/21/18 Yes Johnson, Clanford L, MD  Probiotic Product (PROBIOTIC PO) Take 1 tablet by mouth daily. Women's probiotic for GI health   Yes [provider]    Physical Exam:  Constitutional: Middle-aged female who appears to be in discomfort Vitals:   09/07/2018 1115 09/18/2018 1200 09/14/2018 1245  09/29/2018 1315  BP: (!) 144/97 (!) 129/100 133/87 (!) 149/109  Pulse: 89 99 (!) 107 92  Resp: 19 20 (!) 30 (!) 21  Temp:      TempSrc:      SpO2: 98% 96% 96% 100%  Weight:      Height:       Eyes: PERRL, lids and conjunctivae normal ENMT: Mucous membranes are moist. Posterior pharynx clear of any exudate or lesions.   Neck: normal, supple, no masses, no thyromegaly.  No JVD appreciated. Respiratory: Tachypneic with positive crackles appreciated intermittently throughout both lung fields.  Currently maintaining O2 saturations on 3 L nasal cannula oxygen. Cardiovascular: Mildly tachycardic, no murmurs / rubs / gallops. No extremity edema. 2+ pedal pulses. No carotid bruits.  Abdomen: no tenderness, no masses palpated. No hepatosplenomegaly. Bowel sounds positive.  Musculoskeletal: no clubbing / cyanosis. No joint deformity upper and lower extremities. Good ROM, no contractures. Normal muscle tone.  Skin: no rashes, lesions, ulcers. No induration Neurologic: CN 2-12 grossly intact. Sensation intact, DTR normal. Strength 5/5 in all 4.  Psychiatric: Normal judgment and insight. Alert and oriented x 3.  Anxious mood.     Labs  on Admission: I have personally reviewed following labs and imaging studies  CBC: Recent Labs  Lab 09/15/18 2048 09/16/18 0652 09/05/2018 1014  WBC 29.0* 28.9* 20.5*  NEUTROABS 24.7* 21.7* 16.1*  HGB 11.5* 10.8* 11.8*  HCT 36.1 33.4* 36.9  MCV 96.3 96.8 95.8  PLT 593* 533* 892*   Basic Metabolic Panel: Recent Labs  Lab 09/15/18 2048 09/08/2018 1014  NA 140 137  K 4.7 3.7  CL 102 100  CO2 23 27  GLUCOSE 127* 103*  BUN 14 6  CREATININE 0.54 0.51  CALCIUM 9.1 8.7*   GFR: Estimated Creatinine Clearance: 61.1 mL/min (by C-G formula based on SCr of 0.51 mg/dL). Liver Function Tests: No results for input(s): AST, ALT, ALKPHOS, BILITOT, PROT, ALBUMIN in the last 168 hours. No results for input(s): LIPASE, AMYLASE in the last 168 hours. No results for  input(s): AMMONIA in the last 168 hours. Coagulation Profile: No results for input(s): INR, PROTIME in the last 168 hours. Cardiac Enzymes: Recent Labs  Lab 09/15/18 2048  TROPONINI <0.03   BNP (last 3 results) No results for input(s): PROBNP in the last 8760 hours. HbA1C: No results for input(s): HGBA1C in the last 72 hours. CBG: No results for input(s): GLUCAP in the last 168 hours. Lipid Profile: No results for input(s): CHOL, HDL, LDLCALC, TRIG, CHOLHDL, LDLDIRECT in the last 72 hours. Thyroid Function Tests: No results for input(s): TSH, T4TOTAL, FREET4, T3FREE, THYROIDAB in the last 72 hours. Anemia Panel: No results for input(s): VITAMINB12, FOLATE, FERRITIN, TIBC, IRON, RETICCTPCT in the last 72 hours. Urine analysis:    Component Value Date/Time   COLORURINE YELLOW 05/19/2018 0921   APPEARANCEUR CLOUDY (A) 05/19/2018 0921   LABSPEC 1.008 05/19/2018 0921   PHURINE 6.0 05/19/2018 0921   GLUCOSEU NEGATIVE 05/19/2018 0921   HGBUR SMALL (A) 05/19/2018 0921   BILIRUBINUR NEGATIVE 05/19/2018 0921   KETONESUR NEGATIVE 05/19/2018 0921   PROTEINUR 30 (A) 05/19/2018 0921   NITRITE NEGATIVE 05/19/2018 0921   LEUKOCYTESUR LARGE (A) 05/19/2018 0921   Sepsis Labs: Recent Results (from the past 240 hour(s))  SARS Coronavirus 2 (CEPHEID - Performed in [place] hospital lab), Hosp Order     Status: None   Collection Time: 09/16/18 12:56 AM   Specimen: Nasopharyngeal Swab  Result Value Ref Range Status   SARS Coronavirus 2 NEGATIVE NEGATIVE Final    Comment: (NOTE) If result is NEGATIVE SARS-CoV-2 target nucleic acids are NOT DETECTED. The SARS-CoV-2 RNA is generally detectable in upper and lower  respiratory specimens during the acute phase of infection. The lowest  concentration of SARS-CoV-2 viral copies this assay can detect is 250  copies / mL. A negative result does not preclude SARS-CoV-2 infection  and should not be used as the sole basis for treatment or other   patient management decisions.  A negative result may occur with  improper specimen collection / handling, submission of specimen other  than nasopharyngeal swab, presence of viral mutation(s) within the  areas targeted by this assay, and inadequate number of viral copies  (<250 copies / mL). A negative result must be combined with clinical  observations, patient history, and epidemiological information. If result is POSITIVE SARS-CoV-2 target nucleic acids are DETECTED. The SARS-CoV-2 RNA is generally detectable in upper and lower  respiratory specimens dur ing the acute phase of infection.  Positive  results are indicative of active infection with SARS-CoV-2.  Clinical  correlation with patient history and other diagnostic information is  necessary to determine patient  infection status.  Positive results do  not rule out bacterial infection or co-infection with other viruses. If result is PRESUMPTIVE POSTIVE SARS-CoV-2 nucleic acids MAY BE PRESENT.   A presumptive positive result was obtained on the submitted specimen  and confirmed on repeat testing.  While 2019 novel coronavirus  (SARS-CoV-2) nucleic acids may be present in the submitted sample  additional confirmatory testing may be necessary for epidemiological  and / or clinical management purposes  to differentiate between  SARS-CoV-2 and other Sarbecovirus currently known to infect humans.  If clinically indicated additional testing with an alternate test  methodology [phone]) is advised. The SARS-CoV-2 RNA is generally  detectable in upper and lower respiratory sp ecimens during the acute  phase of infection. The expected result is Negative. Fact Sheet for Patients:  StrictlyIdeas.no Fact Sheet for Healthcare Providers: BankingDealers.co.za This test is not yet approved or cleared by the [place] FDA and has been authorized for detection and/or diagnosis of SARS-CoV-2 by  FDA under an Emergency Use Authorization (EUA).  This EUA will remain in effect (meaning this test can be used) for the duration of the COVID-19 declaration under Section 564(b)(1) of the Act, 21 U.S.C. section 360bbb-3(b)(1), unless the authorization is terminated or revoked sooner. Performed at [facility], [address] [place], [place] [address]      Radiological Exams on Admission: Ct Angio Chest Pe W And/or Wo Contrast  Result Date: 10/01/2018 CLINICAL DATA:  Reported pneumonia diagnosed 1 week ago. Patient complaining of chest and chest wall pain. EXAM: CT ANGIOGRAPHY CHEST WITH CONTRAST TECHNIQUE: Multidetector CT imaging of the chest was performed using the standard protocol during bolus administration of intravenous contrast. Multiplanar CT image reconstructions and MIPs were obtained to evaluate the vascular anatomy. CONTRAST:  57mL OMNIPAQUE IOHEXOL 350 MG/ML SOLN COMPARISON:  CTA chest, 09/15/2018. FINDINGS: Cardiovascular: There is satisfactory opacification of the pulmonary arteries to the segmental level. There is no evidence of a pulmonary embolism. Heart is normal in size. No pericardial effusion. No coronary artery calcifications. Great vessels are normal caliber. No aortic dissection. Mild aortic atherosclerosis. Mediastinum/Nodes: There is pneumomediastinum. This lies adjacent to the esophagus, but is also seen in the middle and anterior mediastinum and in the supraclavicular regions and neck base. Overall, there has been an increase in mediastinal air since the prior CTA. The neck base subcutaneous emphysema is new. Esophagus is mild-to-moderately distended as was on the prior CT. No mediastinal or masses. There scattered prominent lymph nodes along the right paratracheal change, subcarinal region and along both hila, with no discrete, pathologically enlarged no. Subcarinal nodes are contiguous with the distended esophagus. Lungs/Pleura: Lungs show advanced fibrosis with large  areas of honeycombing, unchanged from prior CTA. No convincing pneumonia or pulmonary edema. No pleural effusion and no pneumothorax. Upper Abdomen: Irregular fluid attenuation lies adjacent to the pancreatic tail increased in extent compared to the most recent prior chest CT, and new when compared to the CT dated 07/27/2018. Musculoskeletal: No fracture or acute finding. No osteoblastic or osteolytic lesions. Review of the MIP images confirms the above findings. IMPRESSION: 1. No evidence of a pulmonary embolism. 2. Interval increase in pneumomediastinum, now with air extending to neck base and supraclavicular regions. This may be due to barotrauma in the setting of severe lung fibrosis. However, the esophagus is distended with possible wall thickening. And esophageal source of this air should be considered. 3. Low attenuation adjacent to the pancreas. This may reflect fluid/inflammation if there are clinical findings  consistent with pancreatitis. This appears mildly increased from the most recent prior CT and new from the study dated 07/27/2018. 4. Extensive interstitial fibrosis. Appearance of the lungs is stable from the prior exams. 5. Mild aortic atherosclerosis. Aortic Atherosclerosis (ICD10-I70.0). Electronically Signed   By: Lajean Manes M.D.   On: 09/23/2018 12:06   Dg Chest Port 1 View  Result Date: 10/01/2018 CLINICAL DATA:  Shortness of breath. EXAM: PORTABLE CHEST 1 VIEW COMPARISON:  September 15, 2018 FINDINGS: No pneumothorax. Fibrotic changes in the lungs are stable. The cardiomediastinal silhouette is stable. No other abnormalities. IMPRESSION: Fibrotic changes in the lungs, right greater than left. No other interval changes or acute abnormalities. Electronically Signed   By: Dorise Bullion III M.D   On: 09/10/2018 10:04    EKG: Independently reviewed.  Sinus rhythm at 92 bpm  Assessment/Plan Pneumomediastinum: Patient presents complaining of worsening shortness of breath, chest pain, and  back pain.  Previously left the hospital AMA after being found to have pneumoperitoneum.  Repeat CT angiogram reveals worsening pneumomediastinum. -Admit to a progressive bed -Continuous pulse oximetry with nasal cannula oxygen -N.p.o.  -Start dysphagia diet if barium esophagram negative -Follow-up barium esophagogram -Zosyn IV -Normal saline IV fluids at 75 mL/h -Pain control -Consulted cardiothoracic surgery, will follow-up for further recommendations  SIRS, Leukocytosis: Patient was found to be tachycardic and tachypneic on admission.  WBC elevated at 20.5 appears to be trending down.  Patient currently afebrile, but meet team SIRS criteria.  Suspect secondary to above. -Add on lactic acid level -Check blood cultures  Chronic respiratory failure with hypoxia, interstitial lung disease: As noted on CT imaging. -Continue home breathing treatments and prednisone  Paranoid schizophrenia, depressive disorder: Patient had just recently been seen by telemetry psychiatry on 6/10 who recommended outpatient follow-up as patient previously being seen at [place]. -Continue present  History of DVT on anticoagulation: Patient with history of right lower extremity DVT and left upper extremity DVT during hospitalization in 06/02/18.  Patient has been on Eliquis since that time and took last dose of medicine last night. -Will restart Eliquis if esophageal Phillip Heal negative for tear  Suspect pancreatitis: Acute.  Incidental finding on CT angiogram of the chest.  Patient denies having any nausea or vomiting symptoms. -Follow-up Lipase -Continue IV fluids as seen above  Thrombocytosis: Platelet count 497 appears to be currently trending down. -Continue to monitor   Schizophrenia, bipolar disorder, anxiety/depression -Continue Prozac  GERD -Protonix p.o.  DVT prophylaxis: SCDs Code Status: Full Family Communication: No family present at bedside Disposition Plan: To be determined Consults called:  CT surgery Admission status: Inpatient  Norval Morton MD [facility] Pager [phone]   If 7PM-7AM, please contact night-coverage [email or web address] Password [email or web address]  09/13/2018, 1:40 PM

## 2018-09-20 NOTE — ED Provider Notes (Signed)
[facility] EMERGENCY DEPARTMENT Provider Note   CSN: [id number] Arrival date & time: 09/25/2018  0846     History   Chief Complaint Chief Complaint  Patient presents with  . Shortness of Breath    HPI Evona L Mura is a 58 y.o. female.     The history is provided by the patient and medical records. No language interpreter was used.  Shortness of Breath    58 year old female with history of COPD, prior DVT, schizophrenia, presenting for evaluation of shortness of breath. Patient recently was admitted from 6/5 and was discharged on 6/11 for HCAP.  She was seen again in the ED on 6/13 for SOB.  Chest CTA shows possible pneumomediastinum raising concerns about possible esophageal or tracheal bronchial tree injury.  Pt however refused to have water soluble esophagogram study to r/o esophageal tear and subsequently left AMA on 6/15.  At home, she report progressive worsening pain in her chest from persistent coughing as well as increased shortness of breath.  She endorsed increased generalized weakness and decided to return to ED.  She denies having fever, nausea or vomiting hemoptysis or hematemesis.  She normally is oxygen dependent at 3 L at home.  Past Medical History:  Diagnosis Date  . ADD (attention deficit disorder)   . Anxiety   . Bipolar disorder ([place])   . Chronic pain   . Depression   . Narcotic abuse in remission ([place])   . Schizophrenia ([place])   . Seizures [facility])     Patient Active Problem List   Diagnosis Date Noted  . Pneumomediastinum ([place]) 09/16/2018  . Normocytic anemia 09/08/2018  . GERD (gastroesophageal reflux disease) 09/08/2018  . Hyperglycemia 09/08/2018  . COPD with acute exacerbation ([place]) 09/08/2018  . Pulmonary fibrosis ([place]) 09/08/2018  . Acute on chronic respiratory failure with hypoxia ([place]) 09/07/2018  . History of DVT (deep vein thrombosis) 09/07/2018  . HCAP (healthcare-associated pneumonia) 08/01/2018  . Bowel perforation  ([place])   . Acute blood loss anemia 05/19/2018  . Thrombocytosis ([place]) 05/19/2018  . Hypokalemia   . Schizophrenia, paranoid type ([place]) 08/08/2011  . Narcotic abuse in remission ([place])   . Bipolar disorder ([place])   . Chronic pain   . ADD (attention deficit disorder)     Past Surgical History:  Procedure Laterality Date  . BACK SURGERY    . ESOPHAGOGASTRODUODENOSCOPY N/A 05/19/2018   Procedure: ESOPHAGOGASTRODUODENOSCOPY (EGD);  Surgeon: Yetta Flock, MD;  Location: [facility] ENDOSCOPY;  Service: Gastroenterology;  Laterality: N/A;  egd at bedside in ICU, intubated  . IR ANGIOGRAM SELECTIVE EACH ADDITIONAL VESSEL  05/20/2018  . IR ANGIOGRAM SELECTIVE EACH ADDITIONAL VESSEL  05/20/2018  . IR ANGIOGRAM VISCERAL SELECTIVE  05/20/2018  . IR ANGIOGRAM VISCERAL SELECTIVE  05/20/2018  . IR EMBO ART  VEN HEMORR LYMPH EXTRAV  INC GUIDE ROADMAPPING  05/20/2018  . IR US GUIDE VASC ACCESS LEFT  05/20/2018     OB History   No obstetric history on file.      Home Medications    Prior to Admission medications   Medication Sig Start Date End Date Taking? Authorizing Provider  acetaminophen (TYLENOL) 500 MG tablet Take 1 tablet (500 mg total) by mouth every 8 (eight) hours as needed for moderate pain. 06/08/18 06/08/19  Thurnell Lose, MD  apixaban (ELIQUIS) 5 MG TABS tablet Take 1 tablet (5 mg total) by mouth 2 (two) times daily. 09/13/18   Johnson, Clanford L, MD  benzonatate (TESSALON) 100 MG capsule  Take 1 capsule (100 mg total) by mouth 3 (three) times daily as needed for cough. 09/13/18   Johnson, Clanford L, MD  cetirizine (ZYRTEC) 10 MG tablet Take 10 mg by mouth daily.    [provider]  FLUoxetine (PROZAC) 40 MG capsule Take 1 capsule (40 mg total) by mouth daily. For depression and anxiety. 08/08/11   Ruben Im, PA-C  Fluticasone-Umeclidin-Vilant (TRELEGY ELLIPTA) 100-62.5-25 MCG/INH AEPB Inhale 1 puff into the lungs daily.    [provider]  guaiFENesin (MUCINEX) 600 MG  12 hr tablet Take 800 mg by mouth 2 (two) times daily.     [provider]  Melatonin 5 MG CAPS Take 20 mg by mouth at bedtime.     [provider]  oxyCODONE (OXY IR/ROXICODONE) 5 MG immediate release tablet Take 0.5 tablets (2.5 mg total) by mouth every 6 (six) hours as needed for severe pain. 09/13/18   Johnson, Clanford L, MD  pantoprazole (PROTONIX) 40 MG tablet Take 1 tablet (40 mg total) by mouth 2 (two) times daily. 06/08/18   Thurnell Lose, MD  predniSONE (DELTASONE) 20 MG tablet Take 2 tablets (40 mg total) by mouth daily with breakfast for 7 days. 09/14/18 09/21/18  Murlean Iba, MD  Probiotic Product (PROBIOTIC PO) Take 1 tablet by mouth daily. Women's probiotic for GI health    [provider]    Family History No family history on file.  Social History Social History   Tobacco Use  . Smoking status: Former Smoker    Packs/day: 0.50  . Smokeless tobacco: Never Used  Substance Use Topics  . Alcohol use: No  . Drug use: No     Allergies   Nsaids and Tramadol hcl   Review of Systems Review of Systems  Respiratory: Positive for shortness of breath.   All other systems reviewed and are negative.    Physical Exam Updated Vital Signs BP (!) 153/103 (BP Location: Right Arm)   Pulse (!) 119   Temp 97.8 F (36.6 C) (Oral)   Resp (!) 23   Ht 5\' 3"  (1.6 m)   Wt 49.9 kg   SpO2 96%   BMI 19.49 kg/m   Physical Exam Vitals signs and nursing note reviewed.  Constitutional:      General: She is in acute distress (mild respiratory  discomfort).     Appearance: She is well-developed. She is ill-appearing.  HENT:     Head: Atraumatic.  Eyes:     Conjunctiva/sclera: Conjunctivae normal.  Neck:     Musculoskeletal: Neck supple.  Cardiovascular:     Rate and Rhythm: Tachycardia present.  Pulmonary:     Effort: Tachypnea present.     Breath sounds: Decreased breath sounds present. No wheezing, rhonchi or rales.  Chest:     Chest  wall: Tenderness (tenderness to chest wall on palpation without crepitus or emphysema) present.  Abdominal:     Palpations: Abdomen is soft.     Tenderness: There is no abdominal tenderness.  Musculoskeletal:     Right lower leg: No edema.     Left lower leg: No edema.  Skin:    Findings: No rash.  Neurological:     Mental Status: She is alert.  Psychiatric:        Mood and Affect: Mood normal.      ED Treatments / Results  Labs (all labs ordered are listed, but only abnormal results are displayed) Labs Reviewed  BASIC METABOLIC PANEL - Abnormal;  Notable for the following components:      Result Value   Glucose, Bld 103 (*)    Calcium 8.7 (*)    All other components within normal limits  CBC WITH DIFFERENTIAL/PLATELET - Abnormal; Notable for the following components:   WBC 20.5 (*)    RBC 3.85 (*)    Hemoglobin 11.8 (*)    Platelets 497 (*)    Neutro Abs 16.1 (*)    Eosinophils Absolute 0.7 (*)    Abs Immature Granulocytes 0.13 (*)    All other components within normal limits  NOVEL CORONAVIRUS, NAA (HOSPITAL ORDER, SEND-OUT TO REF LAB)  BRAIN NATRIURETIC PEPTIDE    EKG None  Radiology Ct Angio Chest Pe W And/or Wo Contrast  Result Date: 09/18/2018 CLINICAL DATA:  Reported pneumonia diagnosed 1 week ago. Patient complaining of chest and chest wall pain. EXAM: CT ANGIOGRAPHY CHEST WITH CONTRAST TECHNIQUE: Multidetector CT imaging of the chest was performed using the standard protocol during bolus administration of intravenous contrast. Multiplanar CT image reconstructions and MIPs were obtained to evaluate the vascular anatomy. CONTRAST:  21mL OMNIPAQUE IOHEXOL 350 MG/ML SOLN COMPARISON:  CTA chest, 09/15/2018. FINDINGS: Cardiovascular: There is satisfactory opacification of the pulmonary arteries to the segmental level. There is no evidence of a pulmonary embolism. Heart is normal in size. No pericardial effusion. No coronary artery calcifications. Great vessels are  normal caliber. No aortic dissection. Mild aortic atherosclerosis. Mediastinum/Nodes: There is pneumomediastinum. This lies adjacent to the esophagus, but is also seen in the middle and anterior mediastinum and in the supraclavicular regions and neck base. Overall, there has been an increase in mediastinal air since the prior CTA. The neck base subcutaneous emphysema is new. Esophagus is mild-to-moderately distended as was on the prior CT. No mediastinal or masses. There scattered prominent lymph nodes along the right paratracheal change, subcarinal region and along both hila, with no discrete, pathologically enlarged no. Subcarinal nodes are contiguous with the distended esophagus. Lungs/Pleura: Lungs show advanced fibrosis with large areas of honeycombing, unchanged from prior CTA. No convincing pneumonia or pulmonary edema. No pleural effusion and no pneumothorax. Upper Abdomen: Irregular fluid attenuation lies adjacent to the pancreatic tail increased in extent compared to the most recent prior chest CT, and new when compared to the CT dated 07/27/2018. Musculoskeletal: No fracture or acute finding. No osteoblastic or osteolytic lesions. Review of the MIP images confirms the above findings. IMPRESSION: 1. No evidence of a pulmonary embolism. 2. Interval increase in pneumomediastinum, now with air extending to neck base and supraclavicular regions. This may be due to barotrauma in the setting of severe lung fibrosis. However, the esophagus is distended with possible wall thickening. And esophageal source of this air should be considered. 3. Low attenuation adjacent to the pancreas. This may reflect fluid/inflammation if there are clinical findings consistent with pancreatitis. This appears mildly increased from the most recent prior CT and new from the study dated 07/27/2018. 4. Extensive interstitial fibrosis. Appearance of the lungs is stable from the prior exams. 5. Mild aortic atherosclerosis. Aortic  Atherosclerosis (ICD10-I70.0). Electronically Signed   By: Lajean Manes M.D.   On: 09/26/2018 12:06   Dg Chest Port 1 View  Result Date: 09/14/2018 CLINICAL DATA:  Shortness of breath. EXAM: PORTABLE CHEST 1 VIEW COMPARISON:  September 15, 2018 FINDINGS: No pneumothorax. Fibrotic changes in the lungs are stable. The cardiomediastinal silhouette is stable. No other abnormalities. IMPRESSION: Fibrotic changes in the lungs, right greater than left. No other interval changes or  acute abnormalities. Electronically Signed   By: Dorise Bullion III M.D   On: 09/06/2018 10:04    Procedures Procedures (including critical care time)  Medications Ordered in ED Medications  morphine 4 MG/ML injection 6 mg (6 mg Intravenous Given 09/26/2018 1008)  benzonatate (TESSALON) capsule 100 mg (100 mg Oral Given 09/13/2018 1049)  iohexol (OMNIPAQUE) 350 MG/ML injection 60 mL (60 mLs Intravenous Contrast Given 09/30/2018 1132)  oxyCODONE-acetaminophen (PERCOCET/ROXICET) 5-325 MG per tablet 1 tablet (1 tablet Oral Given 09/29/2018 1228)     Initial Impression / Assessment and Plan / ED Course  I have reviewed the triage vital signs and the nursing notes.  Pertinent labs & imaging results that were available during my care of the patient were reviewed by me and considered in my medical decision making (see chart for details).        BP (!) 153/103 (BP Location: Right Arm)   Pulse (!) 119   Temp 97.8 F (36.6 C) (Oral)   Resp (!) 23   Ht 5\' 3"  (1.6 m)   Wt 49.9 kg   SpO2 96%   BMI 19.49 kg/m    Final Clinical Impressions(s) / ED Diagnoses   Final diagnoses:  Pneumomediastinum [facility])    ED Discharge Orders    None     9:19 AM Patient with history of recurrent lung infection, multiple hospitalizations for HCAP, recently diagnosed with pneumomediastinum but left AMA 3 days ago is presenting today with worsening shortness of breath and worsening cough.  She is on home oxygen at 3 L.  While at 3 L, O2 sats is in  the 80s, it increased to 94% on 4 L.  Her last covid-19 test is negative.  She denies any recent sick contact or recent travel since been discharged home.  Refuses water-soluble esophagogram to rule out perforation during last hospitalization.  She is in agreement of further testing at this time.  1:21 PM Leukocytosis with WBC at 20.5 this is an improvement from prior and likely secondary to recent steroid use.  Chest CT angiogram demonstrate no evidence of PE.  Interval increase in pneumomediastinum now with extending to the neck base and supraclavicular region.  Some evidence of fluid inflammation near the pancreas which could be indicative of pancreatitis.  My suspicion for pancreatitis is low however I will order lipase.  Appreciate consultation from [facility] hospitalist, Dr. Tamala Julian, who will admit patient and will obtain esophagogram and also closely monitoring patient's condition.  At this time patient is stable, no report of any vomiting, or GI bleed.  Care discussed with Dr. Venora Maples.    Domenic Moras, PA-C 09/26/2018 [place], MD 09/16/2018 1610

## 2018-09-20 NOTE — Progress Notes (Signed)
Esophagogram was noted to be negative for any signs of a perforation.  Cardiothoracic surgery did not feel warranted any surgical intervention and they signed off.  Recommended follow-up with pulmonology for further assistance.  Switch patient back to oral medications.

## 2018-09-20 NOTE — ED Notes (Signed)
Admitting at bedside 

## 2018-09-20 NOTE — ED Triage Notes (Signed)
Pt arrives POV from home c/o [facility]; pt reports being sick since January 2020 and "in and out" of the hospital for pneumonia since March. Pt has hx of COPD and wears 3L-4L O2 at home.

## 2018-09-20 NOTE — ED Notes (Addendum)
ED TO INPATIENT HANDOFF REPORT  ED Nurse Name and Phone #:  [phone]  S Name/Age/Gender Christine Ortega 58 y.o. female Room/Bed: 058C/058C  Code Status   Code Status: Full Code  Home/SNF/Other Home Patient oriented to: self, place, time and situation Is this baseline? Yes   Triage Complete: Triage complete  Chief Complaint breathing issues  Triage Note Pt arrives POV from home c/o [facility]; pt reports being sick since January 2020 and "in and out" of the hospital for pneumonia since March. Pt has hx of COPD and wears 3L-4L O2 at home.   Allergies Allergies  Allergen Reactions  . Nsaids Other (See Comments)    Causes GI bleeding  . Tramadol Hcl     Per Daughter- patient has " sleep paralysis"     Level of Care/Admitting Diagnosis ED Disposition    ED Disposition Condition [place]: [place] [100100]  Level of Care: Progressive [102]  Covid Evaluation: Screening Protocol (No Symptoms)  Diagnosis: Pneumoperitoneum [010272]  Admitting Physician: Norval Morton [[id number]]  Attending Physician: Norval Morton [[id number]]  Estimated length of stay: past midnight tomorrow  Certification:: I certify this patient will need inpatient services for at least 2 midnights  PT Class (Do Not Modify): Inpatient [101]  PT Acc Code (Do Not Modify): Private [1]       B Medical/Surgery History Past Medical History:  Diagnosis Date  . ADD (attention deficit disorder)   . Anxiety   . Bipolar disorder ([place])   . Chronic pain   . COPD (chronic obstructive pulmonary disease) ([place])   . Depression   . Narcotic abuse in remission ([place])   . Schizophrenia ([place])   . Seizures ([place])    Past Surgical History:  Procedure Laterality Date  . BACK SURGERY    . ESOPHAGOGASTRODUODENOSCOPY N/A 05/19/2018   Procedure: ESOPHAGOGASTRODUODENOSCOPY (EGD);  Surgeon: Yetta Flock, MD;  Location: [facility] ENDOSCOPY;  Service: Gastroenterology;  Laterality:  N/A;  egd at bedside in ICU, intubated  . IR ANGIOGRAM SELECTIVE EACH ADDITIONAL VESSEL  05/20/2018  . IR ANGIOGRAM SELECTIVE EACH ADDITIONAL VESSEL  05/20/2018  . IR ANGIOGRAM VISCERAL SELECTIVE  05/20/2018  . IR ANGIOGRAM VISCERAL SELECTIVE  05/20/2018  . IR EMBO ART  VEN HEMORR LYMPH EXTRAV  INC GUIDE ROADMAPPING  05/20/2018  . IR US GUIDE VASC ACCESS LEFT  05/20/2018     A IV Location/Drains/Wounds Patient Lines/Drains/Airways Status   Active Line/Drains/Airways    Name:   Placement date:   Placement time:   Site:   Days:   Peripheral IV 09/29/2018 Left Antecubital   09/10/2018    1004    Antecubital   less than 1          Intake/Output Last 24 hours No intake or output data in the 24 hours ending 09/13/2018 1433  Labs/Imaging Results for orders placed or performed during the hospital encounter of 09/19/2018 (from the past 48 hour(s))  Basic metabolic panel     Status: Abnormal   Collection Time: 09/13/2018 10:14 AM  Result Value Ref Range   Sodium 137 135 - 145 mmol/L   Potassium 3.7 3.5 - 5.1 mmol/L   Chloride 100 98 - 111 mmol/L   CO2 27 22 - 32 mmol/L   Glucose, Bld 103 (H) 70 - 99 mg/dL   BUN 6 6 - 20 mg/dL   Creatinine, Ser 0.51 0.44 - 1.00 mg/dL   Calcium 8.7 (L) 8.9 - 10.3 mg/dL  GFR calc non Af Amer >60 >60 mL/min   GFR calc Af Amer >60 >60 mL/min   Anion gap 10 5 - 15    Comment: Performed at [facility] [address] [place], [place] [address]  CBC with Differential     Status: Abnormal   Collection Time: 09/18/2018 10:14 AM  Result Value Ref Range   WBC 20.5 (H) 4.0 - 10.5 K/uL   RBC 3.85 (L) 3.87 - 5.11 MIL/uL   Hemoglobin 11.8 (L) 12.0 - 15.0 g/dL   HCT 36.9 36.0 - 46.0 %   MCV 95.8 80.0 - 100.0 fL   MCH 30.6 26.0 - 34.0 pg   MCHC 32.0 30.0 - 36.0 g/dL   RDW 14.7 11.5 - 15.5 %   Platelets 497 (H) 150 - 400 K/uL   nRBC 0.0 0.0 - 0.2 %   Neutrophils Relative % 79 %   Neutro Abs 16.1 (H) 1.7 - 7.7 K/uL   Lymphocytes Relative 12 %   Lymphs Abs 2.5 0.7 -  4.0 K/uL   Monocytes Relative 5 %   Monocytes Absolute 1.0 0.1 - 1.0 K/uL   Eosinophils Relative 3 %   Eosinophils Absolute 0.7 (H) 0.0 - 0.5 K/uL   Basophils Relative 0 %   Basophils Absolute 0.0 0.0 - 0.1 K/uL   Immature Granulocytes 1 %   Abs Immature Granulocytes 0.13 (H) 0.00 - 0.07 K/uL    Comment: Performed at [facility] [address] [place], [place] [address]  Brain natriuretic peptide     Status: None   Collection Time: 09/29/2018 10:15 AM  Result Value Ref Range   B Natriuretic Peptide 57.8 0.0 - 100.0 pg/mL    Comment: Performed at [facility] [address], [place], [place] [address]   Ct Angio Chest Pe W And/or Wo Contrast  Result Date: 09/21/2018 CLINICAL DATA:  Reported pneumonia diagnosed 1 week ago. Patient complaining of chest and chest wall pain. EXAM: CT ANGIOGRAPHY CHEST WITH CONTRAST TECHNIQUE: Multidetector CT imaging of the chest was performed using the standard protocol during bolus administration of intravenous contrast. Multiplanar CT image reconstructions and MIPs were obtained to evaluate the vascular anatomy. CONTRAST:  54mL OMNIPAQUE IOHEXOL 350 MG/ML SOLN COMPARISON:  CTA chest, 09/15/2018. FINDINGS: Cardiovascular: There is satisfactory opacification of the pulmonary arteries to the segmental level. There is no evidence of a pulmonary embolism. Heart is normal in size. No pericardial effusion. No coronary artery calcifications. Great vessels are normal caliber. No aortic dissection. Mild aortic atherosclerosis. Mediastinum/Nodes: There is pneumomediastinum. This lies adjacent to the esophagus, but is also seen in the middle and anterior mediastinum and in the supraclavicular regions and neck base. Overall, there has been an increase in mediastinal air since the prior CTA. The neck base subcutaneous emphysema is new. Esophagus is mild-to-moderately distended as was on the prior CT. No mediastinal or masses. There scattered prominent lymph nodes  along the right paratracheal change, subcarinal region and along both hila, with no discrete, pathologically enlarged no. Subcarinal nodes are contiguous with the distended esophagus. Lungs/Pleura: Lungs show advanced fibrosis with large areas of honeycombing, unchanged from prior CTA. No convincing pneumonia or pulmonary edema. No pleural effusion and no pneumothorax. Upper Abdomen: Irregular fluid attenuation lies adjacent to the pancreatic tail increased in extent compared to the most recent prior chest CT, and new when compared to the CT dated 07/27/2018. Musculoskeletal: No fracture or acute finding. No osteoblastic or osteolytic lesions. Review of  the MIP images confirms the above findings. IMPRESSION: 1. No evidence of a pulmonary embolism. 2. Interval increase in pneumomediastinum, now with air extending to neck base and supraclavicular regions. This may be due to barotrauma in the setting of severe lung fibrosis. However, the esophagus is distended with possible wall thickening. And esophageal source of this air should be considered. 3. Low attenuation adjacent to the pancreas. This may reflect fluid/inflammation if there are clinical findings consistent with pancreatitis. This appears mildly increased from the most recent prior CT and new from the study dated 07/27/2018. 4. Extensive interstitial fibrosis. Appearance of the lungs is stable from the prior exams. 5. Mild aortic atherosclerosis. Aortic Atherosclerosis (ICD10-I70.0). Electronically Signed   By: Lajean Manes M.D.   On: 09/06/2018 12:06   Dg Chest Port 1 View  Result Date: 10/02/2018 CLINICAL DATA:  Shortness of breath. EXAM: PORTABLE CHEST 1 VIEW COMPARISON:  September 15, 2018 FINDINGS: No pneumothorax. Fibrotic changes in the lungs are stable. The cardiomediastinal silhouette is stable. No other abnormalities. IMPRESSION: Fibrotic changes in the lungs, right greater than left. No other interval changes or acute abnormalities. Electronically  Signed   By: Dorise Bullion III M.D   On: 09/15/2018 10:04    Pending Labs Unresulted Labs (From admission, onward)    Start     Ordered   09/21/18 0500  CBC  Tomorrow morning,   R     09/23/2018 1408   09/21/18 1025  Basic metabolic panel  Tomorrow morning,   R     09/08/2018 1408   09/10/2018 1410  APTT  Add-on,   AD     09/22/2018 1409   09/23/2018 1409  Protime-INR  Add-on,   AD     09/04/2018 1409   09/14/2018 1325  Lipase, blood  Add-on,   AD     09/17/2018 1324   09/30/2018 0955  Novel Coronavirus,NAA,(SEND-OUT TO REF LAB - TAT 24-48 hrs); Hosp Order  (Asymptomatic Patients Labs)  Once,   STAT    Question:  Rule Out  Answer:  Yes   09/28/2018 0954          Vitals/Pain Today's Vitals   10/02/2018 1351 09/25/2018 1400 09/28/2018 1415 09/25/2018 1419  BP:  (!) 135/97 (!) 136/97   Pulse: 99 89 94   Resp: (!) 26 20 14    Temp:      TempSrc:      SpO2: 100% 99% 99%   Weight:      Height:      PainSc:    8     Isolation Precautions No active isolations  Medications Medications  Fluticasone-Umeclidin-Vilant 100-62.5-25 MCG/INH AEPB 1 puff (has no administration in time range)  sodium chloride flush (NS) 0.9 % injection 3 mL (3 mLs Intravenous Given 09/28/2018 1432)  0.9 %  sodium chloride infusion ( Intravenous New Bag/Given 10/02/2018 1419)  ondansetron (ZOFRAN) tablet 4 mg (has no administration in time range)    Or  ondansetron (ZOFRAN) injection 4 mg (has no administration in time range)  albuterol (PROVENTIL) (2.5 MG/3ML) 0.083% nebulizer solution 2.5 mg (has no administration in time range)  morphine 4 MG/ML injection 6 mg (6 mg Intravenous Given 09/11/2018 1008)  benzonatate (TESSALON) capsule 100 mg (100 mg Oral Given 09/22/2018 1049)  iohexol (OMNIPAQUE) 350 MG/ML injection 60 mL (60 mLs Intravenous Contrast Given 09/04/2018 1132)  oxyCODONE-acetaminophen (PERCOCET/ROXICET) 5-325 MG per tablet 1 tablet (1 tablet Oral Given 09/30/2018 1228)  morphine 2 MG/ML injection 6 mg (6 mg  Intravenous Given  10/02/2018 1354)    Mobility walks High fall risk   Focused Assessments Pulmonary Assessment Handoff:  Lung sounds:   O2 Device: Nasal Cannula O2 Flow Rate (L/min): 3 L/min      R Recommendations: See Admitting Provider Note  Report given to:   Additional Notes:  Patient with history of recurrent lung infection, multiple hospitalizations for HCAP, recently diagnosed with pneumomediastinum but left AMA 3 days ago is presenting today with worsening shortness of breath and worsening cough.  She is on home oxygen at 3 L. Her last covid-19 test is negative.  She denies any recent sick contact or recent travel since been discharged home.  Refuses water-soluble esophagogram to rule out perforation during last hospitalization.  She is in agreement of further testing at this time.

## 2018-09-21 ENCOUNTER — Inpatient Hospital Stay (HOSPITAL_COMMUNITY): Payer: Medicare Other

## 2018-09-21 DIAGNOSIS — J982 Interstitial emphysema: Secondary | ICD-10-CM

## 2018-09-21 DIAGNOSIS — J849 Interstitial pulmonary disease, unspecified: Secondary | ICD-10-CM

## 2018-09-21 LAB — CBC
HCT: 33.1 % — ABNORMAL LOW (ref 36.0–46.0)
Hemoglobin: 10.6 g/dL — ABNORMAL LOW (ref 12.0–15.0)
MCH: 30.8 pg (ref 26.0–34.0)
MCHC: 32 g/dL (ref 30.0–36.0)
MCV: 96.2 fL (ref 80.0–100.0)
Platelets: 453 10*3/uL — ABNORMAL HIGH (ref 150–400)
RBC: 3.44 MIL/uL — ABNORMAL LOW (ref 3.87–5.11)
RDW: 14.5 % (ref 11.5–15.5)
WBC: 20.2 10*3/uL — ABNORMAL HIGH (ref 4.0–10.5)
nRBC: 0 % (ref 0.0–0.2)

## 2018-09-21 LAB — BASIC METABOLIC PANEL
Anion gap: 8 (ref 5–15)
BUN: 5 mg/dL — ABNORMAL LOW (ref 6–20)
CO2: 27 mmol/L (ref 22–32)
Calcium: 8.1 mg/dL — ABNORMAL LOW (ref 8.9–10.3)
Chloride: 99 mmol/L (ref 98–111)
Creatinine, Ser: 0.63 mg/dL (ref 0.44–1.00)
GFR calc Af Amer: >60 mL/min (ref >60)
GFR calc non Af Amer: >60 mL/min (ref >60)
Glucose, Bld: 114 mg/dL — ABNORMAL HIGH (ref 70–99)
Potassium: 3.3 mmol/L — ABNORMAL LOW (ref 3.5–5.1)
Sodium: 134 mmol/L — ABNORMAL LOW (ref 135–145)

## 2018-09-21 LAB — NOVEL CORONAVIRUS, NAA (HOSP ORDER, SEND-OUT TO REF LAB; TAT 18-24 HRS): SARS-CoV-2, NAA: NOT DETECTED

## 2018-09-21 LAB — TROPONIN I
Troponin I: <0.03 ng/mL (ref <0.03)
Troponin I: <0.03 ng/mL (ref <0.03)

## 2018-09-21 LAB — SEDIMENTATION RATE: Sed Rate: 80 mm/hr — ABNORMAL HIGH (ref 0–22)

## 2018-09-21 MED ORDER — FUROSEMIDE 10 MG/ML IJ SOLN
20.0000 mg | Freq: Two times a day (BID) | INTRAMUSCULAR | Status: DC
  Administered 2018-09-21 – 2018-09-24 (×7): 20 mg via INTRAVENOUS
  Filled 2018-09-21 (×8): qty 2

## 2018-09-21 MED ORDER — ALPRAZOLAM 0.5 MG PO TABS
0.5000 mg | ORAL_TABLET | Freq: Three times a day (TID) | ORAL | Status: DC | PRN
  Administered 2018-09-21: 0.5 mg via ORAL
  Filled 2018-09-21 (×4): qty 1

## 2018-09-21 MED ORDER — POTASSIUM CHLORIDE CRYS ER 20 MEQ PO TBCR
40.0000 meq | EXTENDED_RELEASE_TABLET | Freq: Once | ORAL | Status: AC
  Administered 2018-09-21: 40 meq via ORAL
  Filled 2018-09-21: qty 2

## 2018-09-21 MED ORDER — GUAIFENESIN-DM 100-10 MG/5ML PO SYRP
5.0000 mL | ORAL_SOLUTION | ORAL | Status: DC | PRN
  Administered 2018-09-21 – 2018-09-24 (×19): 5 mL via ORAL
  Filled 2018-09-21 (×19): qty 5

## 2018-09-21 MED ORDER — OXYCODONE-ACETAMINOPHEN 5-325 MG PO TABS
1.0000 | ORAL_TABLET | Freq: Four times a day (QID) | ORAL | Status: DC | PRN
  Administered 2018-09-21 – 2018-09-22 (×6): 2 via ORAL
  Administered 2018-09-23: 1 via ORAL
  Filled 2018-09-21 (×8): qty 2

## 2018-09-21 MED ORDER — BUDESONIDE 0.25 MG/2ML IN SUSP
0.2500 mg | Freq: Two times a day (BID) | RESPIRATORY_TRACT | Status: DC
  Administered 2018-09-21 – 2018-09-22 (×3): 0.25 mg via RESPIRATORY_TRACT
  Filled 2018-09-21 (×5): qty 2

## 2018-09-21 MED ORDER — OXYCODONE HCL 5 MG PO TABS: 2.5000 mg | ORAL_TABLET | Freq: Four times a day (QID) | ORAL | Status: DC | PRN

## 2018-09-21 MED ORDER — HYDROMORPHONE HCL 1 MG/ML IJ SOLN
1.0000 mg | INTRAMUSCULAR | Status: DC | PRN
  Administered 2018-09-21 – 2018-09-24 (×22): 1 mg via INTRAVENOUS
  Filled 2018-09-21 (×24): qty 1

## 2018-09-21 MED ORDER — IPRATROPIUM-ALBUTEROL 0.5-2.5 (3) MG/3ML IN SOLN
3.0000 mL | Freq: Four times a day (QID) | RESPIRATORY_TRACT | Status: DC
  Administered 2018-09-21 – 2018-09-22 (×5): 3 mL via RESPIRATORY_TRACT
  Filled 2018-09-21 (×8): qty 3

## 2018-09-21 NOTE — Progress Notes (Signed)
RT NOTES: Sats not coming up above 87% on 55% venturi mask. Placed patient on 100% nonrebreather. Sats now 100%. Will continue to monitor.

## 2018-09-21 NOTE — Progress Notes (Signed)
STAFF NOTE: I, Dr Ann Lions have personally reviewed patient's available data, including medical history, events of note, physical examination and test results as part of my evaluation. I have discussed with resident/NP and other care providers such as pharmacist, RN and RRT.  In addition,  I personally evaluated patient and elicited key findings of   S: Date of admit 09/18/2018 with LOS 1 for today 09/21/2018 : Christine Ortega is   - consult called by Dr Karie Kirks for pneumomediastinum. On furhter hx she tells me prior to jan 2020 she was asymptomtic. Then since ":TRALI" in early 2020 progressive dyspne and and then 2 month o2 dependent. Now no GI symptoms but worsening cough/dyspne and hypoxemia neednig 5L [place]. Has mild pnuemomediastinum. Esophageal workup shows no tear. Has significant cough, dyspnea and overall pain (she has severe chronic back pain and is disabled)  ILD exposure: no mold,. No parrots, No birds. 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 Dyust exopsure  +    has a past medical history of ADD (attention deficit disorder), Anxiety, Bipolar disorder ([place]), Chronic pain, COPD (chronic obstructive pulmonary disease) ([place]), Depression, Narcotic abuse in remission ([place]), Schizophrenia ([place]), and Seizures ([place]).   reports that she has quit smoking. She smoked 0.50 packs per day. She has never used smokeless tobacco.   has a past surgical history that includes Back surgery; IR US Guide Vasc Access Left (05/20/2018); IR EMBO ART  VEN HEMORR LYMPH EXTRAV  INC GUIDE ROADMAPPING (05/20/2018); IR Angiogram Visceral Selective (05/20/2018); IR Angiogram Visceral Selective (05/20/2018); IR Angiogram Selective Each Additional Vessel (05/20/2018); Esophagogastroduodenoscopy (N/A, 05/19/2018); and IR Angiogram Selective Each Additional Vessel (05/20/2018).   O:  Blood pressure (!) 129/91, pulse (!) 103, temperature 97.7 F (36.5 C), temperature source Oral, resp. rate (!) 34, height 5\' 3"  (1.6 m),  weight 49.9 kg, SpO2 98 %.   Emacitated Cachectic Looks 22 years older In bed - bad dry shallow cough Crackles Easy desats C/o diffuse pain   HRCT - severe UIP with honeycombnig and mild pneumomediastium. Possible acute pancreatitis - no GI symptoms   LABS    PULMONARY No results for input(s): PHART, PCO2ART, PO2ART, HCO3, TCO2, O2SAT in the last 168 hours.  Invalid input(s): PCO2, PO2  CBC Recent Labs  Lab 09/16/18 0652 09/04/2018 1014 09/21/18 0711  HGB 10.8* 11.8* 10.6*  HCT 33.4* 36.9 33.1*  WBC 28.9* 20.5* 20.2*  PLT 533* 497* 453*    COAGULATION Recent Labs  Lab 09/28/2018 1611  INR 1.0    CARDIAC   Recent Labs  Lab 09/15/18 2048  TROPONINI <0.03   No results for input(s): PROBNP in the last 168 hours.   CHEMISTRY Recent Labs  Lab 09/15/18 2048 09/26/2018 1014 09/21/18 0711  NA 140 137 134*  K 4.7 3.7 3.3*  CL 102 100 99  CO2 23 27 27   GLUCOSE 127* 103* 114*  BUN 14 6 5*  CREATININE 0.54 0.51 0.63  CALCIUM 9.1 8.7* 8.1*   Estimated Creatinine Clearance: 61.1 mL/min (by C-G formula based on SCr of 0.63 mg/dL).   LIVER Recent Labs  Lab 09/14/2018 1611  INR 1.0     INFECTIOUS Recent Labs  Lab 09/19/2018 1611  LATICACIDVEN 1.0     ENDOCRINE CBG (last 3)  No results for input(s): GLUCAP in the last 72 hours.       IMAGING x24h  - image(s) personally visualized  -   highlighted in bold Dg Esophagus W Single Cm (  sol Or Thin Ba)  Result Date: 09/16/2018 CLINICAL DATA:  Pneumomediastinum on CT. EXAM: ESOPHOGRAM/BARIUM SWALLOW TECHNIQUE: Single contrast examination was performed using 100 cc of Omnipaque 300 FLUOROSCOPY TIME:  Fluoroscopy Time:  1 minutes 42 seconds Radiation Exposure Index (if provided by the fluoroscopic device): Not applicable. Number of Acquired Spot Images: 0 COMPARISON:  Today's chest CT. FINDINGS: Single-contrast, focused exam performed with patient in LPO and RPO position. Limitations secondary to patient  clinical status, immobility, shortness of breath. Full column evaluation esophagus demonstrates no persistent narrowing or stricture. No contrast extravasation. There are tertiary contractions within the lower esophagus, suggesting dysmotility. Spontaneous gastroesophageal reflux identified on series 2. IMPRESSION: 1. Moderate limitations, as detailed above. 2. No evidence of contrast extravasation to suggest perforation. 3. Probable presbyesophagus and spontaneous gastroesophageal reflux. Electronically Signed   By: Abigail Miyamoto M.D.   On: 09/05/2018 15:40      A: acute on chronic resp failure due to UIP fibrosis of lung -  With superimposed pneumomediastinum  Pneumomediastium due to below issues  UIP Fibrosis  - end stage  - due to IPF v autoimmune v post inflammator - advanced symptoms of cough   ECOG 4  SEvere cachexia    P: No specific Rx for pneumomediastinum Palliate for dyspnea relieft and cough relief -  Eligible for hospice regardless of etiology  - too frail for anti fibrotics for IPF  - too frail for immune modulators if autoimmune  - too frail and too many medical and social issues for transplant Check serology for secondary etiology      CCM available on [place]  D/w dR [place]   Anti-infectives (From admission, onward)   Start     Dose/Rate Route Frequency Ordered Stop   09/14/2018 1500  piperacillin-tazobactam (ZOSYN) IVPB 3.375 g     3.375 g 12.5 mL/hr over 240 Minutes Intravenous Every 8 hours 09/10/2018 1444     09/19/2018 1445  piperacillin-tazobactam (ZOSYN) IVPB 3.375 g  Status:  Discontinued     3.375 g 100 mL/hr over 30 Minutes Intravenous Every 8 hours 09/19/2018 1438 09/22/2018 1444       Rest per NP/medical resident whose note is outlined above and that I agree with   Dr. Brand Males, M.D., [facility].C.P Pulmonary and Critical Care Medicine Staff Physician [facility] Pager: [phone], If no answer or  between  15:00h - 7:00h: call [phone]  09/21/2018 3:18 PM

## 2018-09-21 NOTE — Progress Notes (Signed)
RT NOTES: Went to room to obtain ABG as ordered. Explained procedure to patient. Patient refused ABG at this time. RN notified.

## 2018-09-21 NOTE — Consult Note (Signed)
NAME:  Christine Ortega, MRN:  [id number], DOB:  Aug 29, 1960, LOS: 1 ADMISSION DATE:  09/26/2018, CONSULTATION DATE:  09/21/2018 REFERRING MD:  Benny Lennert, CHIEF COMPLAINT: pneumomediastinum / Worsening dyspnea in setting of untreated ILD/ COPD   Brief History   Christine L Robertsis a 58 y.o.femalewith medical history significant ofCOPD oxygen dependent on 3 L, schizophrenia, bipolar disorder, depression, chronic pain, DVT on Eliquis;who presents for admission 6/18 with complaints of chest pain. Patient had just been hospitalized from 6/5-6/11 for healthcare associated pneumonia. Then returned to the hospital on 6/13for worsening shortness of breath for which CT angiogram revealed pneumomediastinum. However, patient refused to have soluble esophagogram study and ultimately left AGAINST MEDICAL ADVICE on 6/14. Since leaving the hospital patient reports that she has had progressively worsening pain in her chest and back. She continues to shortness of breath and cough that is intermittently productive ofthick white sputum. Denies having any fever, chills, nausea, or vomiting.CT Angio revealed no PE, but interval increase in  pneumomediastinum  History of present illness   Christine L Robertsis a 58 y.o.femaleformer smoker with medical history significant ofCOPD oxygen dependent on 3 L, schizophrenia, bipolar disorder, depression, chronic pain, DVT on Eliquis;who presents for admission 6/18 with complaints of chest pain. Patient had just been hospitalized from 6/5-6/11 for healthcare associated pneumonia. Then returned to the hospital on 6/13for worsening shortness of breath for which CT angiogram revealed pneumomediastinum. However, patient refused to have soluble esophagogram study and ultimately left AGAINST MEDICAL ADVICE on 6/14. Since leaving the hospital patient reports that she has had progressively worsening pain in her chest and back. She continues to shortness of breath and cough that is  intermittently productive ofthick white sputum. Denies having any fever, chills, nausea, or vomiting.CT Angio revealed no PE, but interval increase in  pneumomediastinumnow with air extending to neck base and supraclavicular regions. This may be due to barotrauma in the setting of severe lung fibrosis. However, the esophagus is distended with possible wall thickening. And esophageal source of this air should be considered.There was also notation of findings of pancreatitis. Esophogram was obtained that did not demonstrate any extravasation to suggest perforation. It did demonstrate likely presbyesophagus and GERD. They stated that no surgical intervention was necessary.   PCCM have been asked to consult for dyspnea and ILD.   Past Medical History   Past Medical History:  Diagnosis Date   ADD (attention deficit disorder)    Anxiety    Bipolar disorder ([place])    Chronic pain    COPD (chronic obstructive pulmonary disease) ([place])    Depression    Narcotic abuse in remission ([place])    Schizophrenia ([place])    Seizures ([place])     Significant Hospital Events   06/2018 Admission with TRALI>> states she has been decompensating since Admission 6/5-6/11>> HAP Admission 6/13-6/14>> Pneumomediastinum>> Left AMA Admission 6/18>> Worsening dyspnea with increased oxygen needs and extension of pneumomediastinum   Consults:  6/18 PCCM  Procedures:    Significant Diagnostic Tests:  09/14/2018 Esophogram Moderate limitations, as detailed above. No evidence of contrast extravasation to suggest perforation. Probable presbyesophagus and spontaneous gastroesophageal reflux.   6/18 CTA Chest  No evidence of a pulmonary embolism.  Interval increase in pneumomediastinum, now with air extending to neck base and supraclavicular regions. This may be due to barotrauma in the setting of severe lung fibrosis. However, the esophagus is distended with possible wall thickening. And esophageal source  of this air should be considered. Low attenuation adjacent to the  pancreas. This may reflect fluid/inflammation if there are clinical findings consistent with pancreatitis. This appears mildly increased from the most recent prior CT and new from the study dated 07/27/2018. Extensive interstitial fibrosis. Appearance of the lungs is stable from the prior exams. Mild aortic atherosclerosis.  Aortic Atherosclerosis (ICD10-I70.0).    Micro Data:  09/17/2018>> Blood 09/23/2018  SARS Coronavirus>> Negative 09/08/2018  RVP>> Negative  Antimicrobials:  Zosyn:  09/26/2018>>   Interim history/subjective:  Worsening dyspnea Coughing   Objective   Blood pressure (!) 129/91, pulse (!) 103, temperature 97.7 F (36.5 C), temperature source Oral, resp. rate (!) 34, height 5\' 3"  (1.6 m), weight 49.9 kg, SpO2 98 %.        Intake/Output Summary (Last 24 hours) at 09/21/2018 1508 Last data filed at 09/21/2018 9562 Gross per 24 hour  Intake 1165 ml  Output --  Net 1165 ml   Filed Weights   10/01/2018 0904  Weight: 49.9 kg    Examination: General: Awake and alert, cachectic, anxious on Non-rebreather, complains of diffuse pain  HENT: NCAT, No LAD Lungs: Crackles throughout, diminished per bases, desaturates easily Cardiovascular: S1, S2, RRR, NO RMG, ST per tele Abdomen: Thin, NT, ND, BS +, Body mass index is 19.49 kg/m. Extremities: No obvious deformities, muscle wasting Neuro: Alert and oriented x 3, MAE x 4   Resolved Hospital Problem list     Assessment & Plan:  Acute on chronic respiratory Failure in setting of COPD/ ILD and pneumomediastinum Suspect related to break down of diffuse honeycombing with recent cough and retching 2/2 pancreatitis. Possible  UIP 2/2 IPF vs  Auto Immune etiology  vs recent  TRALI insult Plan Titrate oxygen for sats of 88-92% Auto immune work up as ordered May add sterids once AI work up results There is no intervention for  pneumomediastinum Continue Robitussin/ Tessalon  for cough Continue Duo Nebs  Continue Albuterol prn DC Breo Start Pulmicort nebs BID Continue Protonix End Stage IPF>> She is very deconditioned. She is not candidate for lung Transplant which is the only option here Palliation Consult for goals of care/ comfort  with Hospice at home  ( Per Dr. Chase Caller she qualifies for Hospice) Suggest anti anxiety, as this may be component of her discomfort Optimize pain medication   All other care per Primary Team.  Dr. Chase Caller is available and on standby to follow up on AI results      Best practice:  Diet: Per Primary Team Pain/Anxiety/Delirium protocol (if indicated): Per Primary Team VAP protocol (if indicated): Per Primary DVT prophylaxis: Eliquis GI prophylaxis: Protonix Glucose control: Per Primary Team Mobility: BR Code Status: Full Family Communication: Discussed with patient at bedside Disposition: Progressive care  Labs   CBC: Recent Labs  Lab 09/15/18 2048 09/16/18 0652 09/13/2018 1014 09/21/18 0711  WBC 29.0* 28.9* 20.5* 20.2*  NEUTROABS 24.7* 21.7* 16.1*  --   HGB 11.5* 10.8* 11.8* 10.6*  HCT 36.1 33.4* 36.9 33.1*  MCV 96.3 96.8 95.8 96.2  PLT 593* 533* 497* 453*    Basic Metabolic Panel: Recent Labs  Lab 09/15/18 2048 09/22/2018 1014 09/21/18 0711  NA 140 137 134*  K 4.7 3.7 3.3*  CL 102 100 99  CO2 23 27 27   GLUCOSE 127* 103* 114*  BUN 14 6 5*  CREATININE 0.54 0.51 0.63  CALCIUM 9.1 8.7* 8.1*   GFR: Estimated Creatinine Clearance: 61.1 mL/min (by C-G formula based on SCr of 0.63 mg/dL). Recent Labs  Lab 09/15/18 2048 09/16/18 1308 09/15/2018  1014 09/03/2018 1611 09/21/18 0711  WBC 29.0* 28.9* 20.5*  --  20.2*  LATICACIDVEN  --   --   --  1.0  --     Liver Function Tests: No results for input(s): AST, ALT, ALKPHOS, BILITOT, PROT, ALBUMIN in the last 168 hours. Recent Labs  Lab 09/18/2018 1611  LIPASE 28   No results for input(s): AMMONIA in  the last 168 hours.  ABG    Component Value Date/Time   PHART 7.261 (L) 05/24/2018 1137   PCO2ART 63.0 (H) 05/24/2018 1137   PO2ART 55.0 (L) 05/24/2018 1137   HCO3 28.2 (H) 05/24/2018 1137   TCO2 30 05/24/2018 1137   ACIDBASEDEF 8.0 (H) 05/20/2018 1049   O2SAT 82.0 05/24/2018 1137     Coagulation Profile: Recent Labs  Lab 09/06/2018 1611  INR 1.0    Cardiac Enzymes: Recent Labs  Lab 09/15/18 2048  TROPONINI <0.03    HbA1C: Hgb A1c MFr Bld  Date/Time Value Ref Range Status  09/10/2018 05:56 AM 5.8 (H) 4.8 - 5.6 % Final    Comment:    (NOTE) Pre diabetes:          5.7%-6.4% Diabetes:              >6.4% Glycemic control for   <7.0% adults with diabetes     CBG: No results for input(s): GLUCAP in the last 168 hours.  Review of Systems:    Gen: Denies fever, chills, +weight change, +fatigue, night sweats HEENT: Denies blurred vision, double vision, hearing loss, tinnitus, sinus congestion, rhinorrhea, sore throat, neck stiffness, dysphagia PULM: + shortness of breath, +cough, sputum production, hemoptysis, wheezing CV: Denies chest pain, edema, orthopnea, paroxysmal nocturnal dyspnea, palpitations GI: Denies abdominal pain, +nausea, +vomiting, diarrhea, hematochezia, melena, constipation, change in bowel habits GU: Denies dysuria, hematuria, polyuria, oliguria, urethral discharge Endocrine: Denies hot or cold intolerance, polyuria, polyphagia or appetite change Derm: Denies rash, dry skin, scaling or peeling skin change Heme: Denies easy bruising, bleeding, bleeding gums Neuro: Denies headache, numbness, weakness, slurred speech, loss of memory or consciousness  She is very anxious   Past Medical History  She,  has a past medical history of ADD (attention deficit disorder), Anxiety, Bipolar disorder ([place]), Chronic pain, COPD (chronic obstructive pulmonary disease) ([place]), Depression, Narcotic abuse in remission ([place]), Schizophrenia ([place]), and Seizures ([place]).    Surgical History    Past Surgical History:  Procedure Laterality Date   BACK SURGERY     ESOPHAGOGASTRODUODENOSCOPY N/A 05/19/2018   Procedure: ESOPHAGOGASTRODUODENOSCOPY (EGD);  Surgeon: Yetta Flock, MD;  [facility] ENDOSCOPY;  Service: Gastroenterology;  Laterality: N/A;  egd at bedside in ICU, intubated   IR [place]  05/20/2018   IR ANGIOGRAM SELECTIVE EACH ADDITIONAL VESSEL  05/20/2018   IR ANGIOGRAM VISCERAL SELECTIVE  05/20/2018   IR ANGIOGRAM VISCERAL SELECTIVE  05/20/2018   IR EMBO ART  VEN HEMORR LYMPH EXTRAV  INC GUIDE ROADMAPPING  05/20/2018   IR US GUIDE VASC ACCESS LEFT  05/20/2018     Social History   reports that she has quit smoking. She smoked 0.50 packs per day. She has never used smokeless tobacco. She reports that she does not drink alcohol or use drugs.   Family History   Her family history is not on file.   Allergies Allergies  Allergen Reactions   Nsaids Other (See Comments)    Causes GI bleeding   Tramadol Hcl     Per Daughter- patient has " sleep  paralysis"      Home Medications  Prior to Admission medications   Medication Sig Start Date End Date Taking? Authorizing Provider  acetaminophen (TYLENOL) 500 MG tablet Take 1 tablet (500 mg total) by mouth every 8 (eight) hours as needed for moderate pain. 06/08/18 06/08/19 Yes Thurnell Lose, MD  apixaban (ELIQUIS) 5 MG TABS tablet Take 1 tablet (5 mg total) by mouth 2 (two) times daily. 09/13/18  Yes Johnson, Clanford L, MD  benzonatate (TESSALON) 100 MG capsule Take 1 capsule (100 mg total) by mouth 3 (three) times daily as needed for cough. 09/13/18  Yes Johnson, Clanford L, MD  cetirizine (ZYRTEC) 10 MG tablet Take 10 mg by mouth daily.   Yes [provider]  FLUoxetine (PROZAC) 40 MG capsule Take 1 capsule (40 mg total) by mouth daily. For depression and anxiety. 08/08/11  Yes Mashburn, Marlane Hatcher, PA-C  Fluticasone-Umeclidin-Vilant (TRELEGY ELLIPTA)  100-62.5-25 MCG/INH AEPB Inhale 1 puff into the lungs daily.   Yes [provider]  guaiFENesin (MUCINEX) 600 MG 12 hr tablet Take 800 mg by mouth 2 (two) times daily.    Yes [provider]  ipratropium-albuterol (DUONEB) 0.5-2.5 (3) MG/3ML SOLN Take 3 mLs by nebulization every 6 (six) hours.   Yes [provider]  Melatonin 5 MG CAPS Take 20 mg by mouth at bedtime.    Yes [provider]  oxyCODONE (OXY IR/ROXICODONE) 5 MG immediate release tablet Take 0.5 tablets (2.5 mg total) by mouth every 6 (six) hours as needed for severe pain. 09/13/18  Yes Johnson, Clanford L, MD  OXYGEN Inhale 3 L into the lungs continuous.   Yes [provider]  pantoprazole (PROTONIX) 40 MG tablet Take 1 tablet (40 mg total) by mouth 2 (two) times daily. 06/08/18  Yes Thurnell Lose, MD  predniSONE (DELTASONE) 20 MG tablet Take 2 tablets (40 mg total) by mouth daily with breakfast for 7 days. 09/14/18 09/21/18 Yes Johnson, Clanford L, MD  Probiotic Product (PROBIOTIC PO) Take 1 tablet by mouth daily. Women's probiotic for GI health   Yes [provider]     Critical care time: 60 minutes    Magdalen Spatz, AGACNP-BC [place] Pager # [phone] After 4 pm call [phone] 09/21/2018 3:08 PM

## 2018-09-21 NOTE — Progress Notes (Addendum)
I was called by nursing regarding patient with an abrupt decline in status after she was taken to the bathroom. She had onset of chest pain and severe desaturation. She is now resting in bed. She is saturating at 100% on a 100% non-rebreather. EKG, pCXR, and ABG have been ordered. I went to evaluate the patient. She appeared a little anxious and short of breath on the 100% non-rebreather. Heart rate was in the 110's-120.   Lungs: No wheezes, rales, or rhonchi are auscultated. Heart: regular rate and rhythm.  Awaiting results of EKG, CXR, and ABG.   I have confirmed with the patient that she wants to be a full code. She stated "I want you to save me?". She may require transfer to the ICU.  Additional time 42 minutes.

## 2018-09-21 NOTE — Progress Notes (Addendum)
PROGRESS NOTE  Christine Ortega RDE:[id number] DOB: 1960/10/15 DOA: 09/12/2018 PCP: Lucia Gaskins, MD  Brief History   Christine Ortega is a 58 y.o. female with medical history significant of COPD oxygen dependent on 3 L, schizophrenia, bipolar disorder, depression, chronic pain, DVT on Eliquis; who presents with complaints of chest pain. Patient had just been hospitalized from 6/5-6/11 for healthcare associated pneumonia.  Then returned to the hospital on 6/13 for worsening shortness of breath for which CT angiogram revealed pneumomediastinum.  However, patient refused to have soluble esophagogram study and ultimately left AGAINST MEDICAL ADVICE on 6/14.  Since leaving the hospital patient reports that she has had progressively worsening pain in her chest and back.  She continues to shortness of breath and cough that is intermittently productive of thick white sputum.  Denies having any fever, chills, nausea, or vomiting.  Upon admission into the emergency department patient was seen to be afebrile, pulse 83-119, respirations 19-30, blood pressures 127/93-153/103, and O2 saturations 89 to 100% on 3 L of nasal cannula oxygen.  Labs revealed WBC 20.5, hemoglobin 11.8, and platelets 497.  Chest x-ray showed fibrotic changes in the lungs right greater than left, but no other interval changes.  CT angiogram of the chest did reveal interval increase in pneumomediastinum, extensive interstitial fibrosis, and clinical findings consistent with pancreatitis.  Patient had been given oxycodone 5-325 mg, morphine 6mg  IV, and Tessalon 100 mg orally.  TRH called to admit.  Negative for COVID 4 days ago.  Cardiothoracic Surgery was consulted regarding the pneumomediastinum. Esophagram was obtained that did not demonstrate any extravasation to suggest perforation. It did demonstrate likely presbyesophagus and GERD. They stated that no surgical intervention was necessary.   Consultants  . None  Procedures  . None   Antibiotics   . None  Subjective  The patient is sitting up in bed. She is complaining of "pain in her ribs". She refuses to allow me to examine her abdomen, but states that that is not where the pain is.  Objective   Vitals:  Vitals:   09/21/18 0836 09/21/18 1127  BP: (!) 139/95 (!) 129/91  Pulse: (!) 106 (!) 103  Resp: (!) 23 (!) 34  Temp: 98.3 F (36.8 C) 97.7 F (36.5 C)  SpO2: 97% 94%    Exam:  Constitutional:  . The patient is awake, alert, and oriented x 3. Postive for acute distress from "rib pain". Respiratory:  . Positive for increased work of breathing, particularly with conversation. . No wheezes, rales, or rhonchi are auscultated. . No tactile fremitus Cardiovascular:  . Regular rate and rhythm. . No murmurs, ectopy, or gallups. . No lateral PMI. No thrills. Abdomen: Patient refuses to allow examination. Musculoskeletal:  . No cyanosis, clubbing, or edema Skin:  . No rashes, lesions, ulcers . palpation of skin: no induration or nodules Neurologic:  . CN 2-12 intact . Sensation all 4 extremities intact Psychiatric:  . Mental status o Mood, affect appropriate: Mood - depressed. Affect agitated. o Orientation to person, place, time  . judgment and insight appear poor   I have personally reviewed the following:   Today's Data  . Vitals, CBC, BMP  Micro Data  . COVID - 19 negative  Imaging  . CTA Chest: No {PTX Increase in pneumomediastinum and new subcutaneous emphysema.   Scheduled Meds: . acidophilus   Oral Daily  . apixaban  5 mg Oral BID  . FLUoxetine  40 mg Oral Daily  . fluticasone furoate-vilanterol  1  puff Inhalation Daily   And  . umeclidinium bromide  1 puff Inhalation Daily  . Melatonin  18 mg Oral QHS  . pantoprazole  40 mg Oral BID  . potassium chloride  40 mEq Oral Once  . predniSONE  40 mg Oral Q breakfast  . sodium chloride flush  3 mL Intravenous Q12H   Continuous Infusions: . sodium chloride 75 mL/hr at 09/21/18  8127  . piperacillin-tazobactam (ZOSYN)  IV 3.375 g (09/21/18 5170)    Principal Problem:   Pneumomediastinum ([place]) Active Problems:   Bipolar disorder ([place])   Chronic pain   Schizophrenia, paranoid type ([place])   Thrombocytosis ([place])   LOS: 1 day    A & P   Pneumomediastinum: Patient presents complaining of worsening shortness of breath, chest pain, and back pain.  Previously left the hospital AMA after being found to have pneumoperitoneum.  Repeat CT angiogram reveals worsening pneumomediastinum. Barium esophagram was negative and CTS states that there is no indication for their involvement. Dysphagia diet will be started. Continue IV Zosyn. Consult pulmonology.  Acute on chronic hypoxic respiratory failure: Pulmonary fibrosis on CTA chest. Saturating in 90's on 5 Liters. Nebulizer treatments and prednisone continued. Pt uses 3L O2 continuously at home.  Pulmonary fibrosis: I have discussed the patient with Dr. Posey Rea who will see the patient. He states that the patient's pneumomediastinum is likely due to the bursting of blebs due to her end stage pulmonary fibrosis. He states that he will see the patient and has ordered an autoimmune work up.  SIRS, Leukocytosis: Patient was found to be tachycardic and tachypneic on admission.  WBC elevated at 20.5. Patient currently afebrile, but meet team SIRS criteria. Suspect secondary to above. Lactic acid 1.0. Blood cultures x 2 show no growth.  Chronic respiratory failure with hypoxia, interstitial lung disease: As noted on CT imaging. Continue home breathing treatments and prednisone.  Pancreatitis: Pt has a history of alcohol abuse. Although her lipase is unremarkable at 28, the CT demonstrates edema and stranding about the tail of the pancrease consistent with pancreatitis. Possibly acute exacerbation of chronic pancreatitis? Pt refuses to allow me to palpate or otherwise examine her abdomen. She denies abdominal pain and states that her  pain is in her ribs. Monitor for signs of worsening pain with diet. Repeat CT abdomen if warranted. Pain control and antiemetics offered.  Paranoid schizophrenia, depressive disorder: Patient had just recently been seen by telemetry psychiatry on 6/10 who recommended outpatient follow-up as patient previously being seen at [facility].   History of DVT on anticoagulation: Patient with history of right lower extremity DVT and left upper extremity DVT during hospitalization in 06/02/18.  Patient has been on Eliquis since that time and took last dose of medicine last night. Continue Eliquis as esophagram negative for perforation/tear of esophagus.  Suspect pancreatitis: Acute. Incidental finding on CT angiogram of the chest.  Patient denies having any nausea or vomiting symptoms. Lipasse negative, but possible acute on chronic/burned out pancreatitis. Have restarted diet. Consider CT abdomen and pelvis is pain worsens. Monitor. Patient refuses to allow me to examine her abdomen.  Thrombocytosis: Platelet count 497 on admission. Trended down to 453 today. Monitor.   Schizophrenia, bipolar disorder, anxiety/depression: Continue Prozac, but consider role of prozac in pulmonary fibrosis.  GERD: Protonix p.o.  DVT prophylaxis: SCDs Code Status: Full Family Communication: No family present at bedside Disposition Plan: To be determined  Jerren Flinchbaugh, DO [facility] Direct contact: see [email or web address]  7PM-7AM contact night  coverage as above  09/21/2018, 1:24 PM  LOS: 1 day

## 2018-09-21 NOTE — Progress Notes (Signed)
RT NOTES: Called to room by nurse. Patient desaturated to 65%, RN placing on HFNC. Patient states she breathes better out of her mouth. Now placed on 55% venturi mask so patient can mouth breathe.

## 2018-09-21 NOTE — Consult Note (Signed)
   [facility] CM Inpatient Consult   09/21/2018  AILANI GOVERNALE Nov 06, 1960 [id number]    Patient screened if potential [place] [facility] services needed bythis [employer] memberwith readmission within 7 days and within 30 days in the past 6 months; and has 31% extreme high risk for unplanned readmission.  Per chart review and History and Physical dated 09/30/2018 reveal as follows:  [name] is a 58 y.o. female with medical history significant of COPD oxygen dependent on 3 L, schizophrenia, bipolar disorder, depression, chronic pain, DVT on Eliquis;   who presents with complaints of chest pain.  Patient had just been hospitalized from 6/5-6/11 for healthcare associated pneumonia.  Then returned to the hospital on 6/13 for worsening shortness of breath for which CT angiogram revealed pneumomediastinum.  However, patient refused to have soluble esophagogram study and ultimately left AGAINST MEDICAL ADVICE on 6/14.  Since leaving the hospital patient reports that she has had progressively worsening pain in her chest and back.  She continues to have shortness of breath and cough that is intermittently productive of thick white sputum. Denies having any fever, chills, nausea, or vomiting. She is in agreement of further testing at this time.  Primary Care Provider listed: Dr.Richard M. Dondiego (cardiology).  Attempted to confirm with provider's office of affiliation at [phone] but office is close for the day, and left a voicemail and requested a return call. Currently, listed as Unk Lightning, MD office at [place], [place], which is not a [facility] affiliate.   Call attempts made to speak to patient in her room but unsuccessful; called the nursing station as well, and was connected to patient's room but still with no response.  Reached out to transition of care RN CM for potential needs but disposition still to be determined for now.   Will follow  for disposition and if eligible for [facility] services with PCP.  Of note, [facility] Care Management services does not replace or interfere with any services that are arranged by transition of care case management or social work.   For questions and additional information, please call:  Ashauna Bertholf A. Aidian Salomon, BSN, RN-BC [facility] Liaison Cell: [phone]

## 2018-09-22 LAB — RHEUMATOID FACTOR: Rheumatoid fact SerPl-aCnc: 10.1 IU/mL (ref 0.0–13.9)

## 2018-09-22 LAB — RESPIRATORY PANEL BY PCR

## 2018-09-22 LAB — BASIC METABOLIC PANEL
Anion gap: 10 (ref 5–15)
BUN: <5 mg/dL — ABNORMAL LOW (ref 6–20)
CO2: 28 mmol/L (ref 22–32)
Calcium: 8.4 mg/dL — ABNORMAL LOW (ref 8.9–10.3)
Chloride: 100 mmol/L (ref 98–111)
Creatinine, Ser: 0.58 mg/dL (ref 0.44–1.00)
GFR calc Af Amer: >60 mL/min (ref >60)
GFR calc non Af Amer: >60 mL/min (ref >60)
Glucose, Bld: 92 mg/dL (ref 70–99)
Potassium: 3.8 mmol/L (ref 3.5–5.1)
Sodium: 138 mmol/L (ref 135–145)

## 2018-09-22 LAB — CBC
HCT: 30.3 % — ABNORMAL LOW (ref 36.0–46.0)
Hemoglobin: 9.8 g/dL — ABNORMAL LOW (ref 12.0–15.0)
MCH: 30.7 pg (ref 26.0–34.0)
MCHC: 32.3 g/dL (ref 30.0–36.0)
MCV: 95 fL (ref 80.0–100.0)
Platelets: 438 10*3/uL — ABNORMAL HIGH (ref 150–400)
RBC: 3.19 MIL/uL — ABNORMAL LOW (ref 3.87–5.11)
RDW: 14.3 % (ref 11.5–15.5)
WBC: 19.5 10*3/uL — ABNORMAL HIGH (ref 4.0–10.5)
nRBC: 0 % (ref 0.0–0.2)

## 2018-09-22 LAB — ANTI-DNA ANTIBODY, DOUBLE-STRANDED: ds DNA Ab: <1 IU/mL (ref 0–9)

## 2018-09-22 LAB — SJOGRENS SYNDROME-A EXTRACTABLE NUCLEAR ANTIBODY: SSA (Ro) (ENA) Antibody, IgG: <0.2 AI (ref 0.0–0.9)

## 2018-09-22 LAB — ANTI-SCLERODERMA ANTIBODY: Scleroderma (Scl-70) (ENA) Antibody, IgG: <0.2 AI (ref 0.0–0.9)

## 2018-09-22 LAB — TROPONIN I: Troponin I: <0.03 ng/mL (ref <0.03)

## 2018-09-22 LAB — SJOGRENS SYNDROME-B EXTRACTABLE NUCLEAR ANTIBODY: SSB (La) (ENA) Antibody, IgG: 0.2 AI (ref 0.0–0.9)

## 2018-09-22 MED ORDER — LIDOCAINE 5 % EX PTCH
1.0000 | MEDICATED_PATCH | CUTANEOUS | Status: DC
  Filled 2018-09-22: qty 1

## 2018-09-22 MED ORDER — LIDOCAINE 5 % EX PTCH
1.0000 | MEDICATED_PATCH | CUTANEOUS | Status: DC
  Administered 2018-09-22 – 2018-09-23 (×2): 1 via TRANSDERMAL
  Filled 2018-09-22 (×2): qty 1

## 2018-09-22 NOTE — Progress Notes (Signed)
PROGRESS NOTE  Christine Ortega JYN:[id number] DOB: 1961-02-23 DOA: 09/05/2018 PCP: Lucia Gaskins, MD  Brief History   Christine Ortega is a 58 y.o. female with medical history significant of COPD oxygen dependent on 3 L, schizophrenia, bipolar disorder, depression, chronic pain, DVT on Eliquis; who presents with complaints of chest pain. Patient had just been hospitalized from 6/5-6/11 for healthcare associated pneumonia.  Then returned to the hospital on 6/13 for worsening shortness of breath for which CT angiogram revealed pneumomediastinum.  However, patient refused to have soluble esophagogram study and ultimately left AGAINST MEDICAL ADVICE on 6/14.  Since leaving the hospital patient reports that she has had progressively worsening pain in her chest and back.  She continues to shortness of breath and cough that is intermittently productive of thick white sputum.  Denies having any fever, chills, nausea, or vomiting.  Upon admission into the emergency department patient was seen to be afebrile, pulse 83-119, respirations 19-30, blood pressures 127/93-153/103, and O2 saturations 89 to 100% on 3 L of nasal cannula oxygen.  Labs revealed WBC 20.5, hemoglobin 11.8, and platelets 497.  Chest x-ray showed fibrotic changes in the lungs right greater than left, but no other interval changes.  CT angiogram of the chest did reveal interval increase in pneumomediastinum, extensive interstitial fibrosis, and clinical findings consistent with pancreatitis.  Patient had been given oxycodone 5-325 mg, morphine 6mg  IV, and Tessalon 100 mg orally.  TRH called to admit.  Negative for COVID 4 days ago.  Cardiothoracic Surgery was consulted regarding the pneumomediastinum. Esophagram was obtained that did not demonstrate any extravasation to suggest perforation. It did demonstrate likely presbyesophagus and GERD. They stated that no surgical intervention was necessary.   Pulmonology was consulted the following  morning. The patient has end stage IPF wit severe honeycombing. The pneumomediastinum is due to breaking of blebs. Dr. Chase Caller stated that this was most likely due to autoimmune causes and he ordered some labwork to investigate this. He further stated that she is not eligible for a lung transplant which would be the only medical help for her.   Consultants   Pulmonology  Palliative Care  Procedures   None  Antibiotics    Zosyn  Subjective  The patient is sitting up in bed. She states that she is feeling better, although she continues to require a non-rebreather. No new complaints.  Objective   Vitals:  Vitals:   09/22/18 1502 09/22/18 1551  BP:    Pulse: (!) 118 87  Resp:    Temp:    SpO2: 97% 99%    Exam:  Constitutional:   The patient is awake, alert, and oriented x 3. No acute distress. Respiratory:   Positive for increased work of breathing, particularly with conversation.  No wheezes, rales, or rhonchi are auscultated.  No tactile fremitus Cardiovascular:   Regular rate and rhythm.  No murmurs, ectopy, or gallups.  No lateral PMI. No thrills. Abdomen: Patient refuses to allow examination. Musculoskeletal:   No cyanosis, clubbing, or edema Skin:   No rashes, lesions, ulcers  palpation of skin: no induration or nodules Neurologic:   CN 2-12 intact  Sensation all 4 extremities intact Psychiatric:   Mental status o Mood, affect appropriate: Mood - depressed. Affect agitated. o Orientation to person, place, time   judgment and insight appear poor   I have personally reviewed the following:   Today's Data   Vitals, CBC, BMP  Micro Data   COVID - 19 negative  Imaging  CTA Chest: No {PTX Increase in pneumomediastinum and new subcutaneous emphysema.  CXR: Extensive intersitial fibrotic lung disease with slightly increased interstitial prominence as compared to previous, which could reflect a degree of superimposed interstitial  congestion/edema.  Scheduled Meds:  acidophilus   Oral Daily   apixaban  5 mg Oral BID   budesonide (PULMICORT) nebulizer solution  0.25 mg Nebulization BID   FLUoxetine  40 mg Oral Daily   furosemide  20 mg Intravenous Q12H   ipratropium-albuterol  3 mL Nebulization Q6H   lidocaine  1 patch Transdermal Q24H   Melatonin  18 mg Oral QHS   pantoprazole  40 mg Oral BID   predniSONE  40 mg Oral Q breakfast   sodium chloride flush  3 mL Intravenous Q12H   umeclidinium bromide  1 puff Inhalation Daily   Continuous Infusions:  piperacillin-tazobactam (ZOSYN)  IV 3.375 g (09/22/18 1339)    Principal Problem:   Pneumomediastinum ([facility]) Active Problems:   Bipolar disorder (H[facility])   Chronic pain   Schizophrenia, paranoid type ([place])   Thrombocytosis ([place])   ILD (interstitial lung disease) ([place])   LOS: 2 days   A & P   Pneumomediastinum: Patient presents complaining of worsening shortness of breath, chest pain, and back pain.  Previously left the hospital AMA after being found to have pneumoperitoneum.  Repeat CT angiogram reveals worsening pneumomediastinum. Barium esophagram was negative and CTS states that there is no indication for their involvement. Dysphagia diet will be started. Continue IV Zosyn. Consult pulmonology.Pulmonology was consulted the following morning. The patient has end stage IPF wit severe honeycombing. The pneumomediastinum is due to breaking of blebs. Dr. Chase Caller stated that this was most likely due to autoimmune causes and he ordered some labwork to investigate this. He further stated that she is not eligible for a lung transplant which would be the only medical help for her. She decompensated yesterday afternoon, and I confirmed with her that she wanted to be a full code. This morning I spoke with her husband, Christine Ortega, who stated that given the end stage of her disease and the lack of options for treatment, that she should be hospice and a DNR. Palliative care  has been consulted and the patient remains a full code for now as this was the patient's decision at this time.  Acute on chronic hypoxic respiratory failure: Pulmonary fibrosis on CTA chest. Saturating in high 90's on 100% NRB. Nebulizer treatments and prednisone continued. Pt uses 3L O2 continuously at home.  Pulmonary fibrosis: I have discussed the patient with Dr. Posey Rea who will see the patient. He states that the patient's pneumomediastinum is likely due to the bursting of blebs due to her end stage pulmonary fibrosis. He states that he will see the patient and has ordered an autoimmune work up. There are no options for treatment for the patient and she is not eligible for a lung transplant. Palliative care has been consulted.  SIRS, Leukocytosis: Patient was found to be tachycardic and tachypneic on admission.  WBC elevated at 20.5. Patient currently afebrile, but meet team SIRS criteria. Suspect secondary to above. Lactic acid 1.0. Blood cultures x 2 show no growth.  Chronic respiratory failure with hypoxia, interstitial lung disease: As noted on CT imaging. Continue home breathing treatments and prednisone.  Pancreatitis: Pt has a history of alcohol abuse. Although her lipase is unremarkable at 28, the CT demonstrates edema and stranding about the tail of the pancrease consistent with pancreatitis. Possibly acute exacerbation of chronic  pancreatitis? Pt refuses to allow me to palpate or otherwise examine her abdomen. She denies abdominal pain and states that her pain is in her ribs. However, the patient is eating and does not complain of abdominal pain or nausea following meals. Doubt pancreatitis. Monitor.  Paranoid schizophrenia, depressive disorder: Patient had just recently been seen by telemetry psychiatry on 6/10 who recommended outpatient follow-up as patient previously being seen at [facility].   History of DVT on anticoagulation: Patient with history of right lower extremity DVT  and left upper extremity DVT during hospitalization in 06/02/18.  Patient has been on Eliquis since that time and took last dose of medicine last night. Continue Eliquis as esophagram negative for perforation/tear of esophagus.  Thrombocytosis: Platelet count 497 on admission. Trended down to 438 today. Monitor.   GERD: Protonix p.o.  I have seen and examined this patient myself. I have spent 52 minutes in her evaluation and care. I have spent more than 50% of this time in counseling with the patient's husband Christine Ortega. I answered his questions to the best of my ability.  DVT prophylaxis: SCDs Code Status: Full Family Communication: No family present at bedside Disposition Plan: To be determined  Christine Trager, DO [facility] Direct contact: see [email or web address]  7PM-7AM contact night coverage as above  09/22/2018, 4:37 PM  LOS: 1 day

## 2018-09-22 NOTE — Progress Notes (Signed)
Came into patient's room and she said that she could not take her breathing treatment right now because she was having trouble breathing.  Patient was on NRB and her sats were 99%.  I explained that I could give her the treatment without her having to remove the NRB mask.  I had already scanned medicines and opened the Duoneb packet to give her and the patient stated she did not want the treatment.  Could not return treatment due to already opening it and just about to administer it.

## 2018-09-23 DIAGNOSIS — Z515 Encounter for palliative care: Secondary | ICD-10-CM

## 2018-09-23 LAB — BASIC METABOLIC PANEL
Anion gap: 12 (ref 5–15)
BUN: <5 mg/dL — ABNORMAL LOW (ref 6–20)
CO2: 37 mmol/L — ABNORMAL HIGH (ref 22–32)
Calcium: 8.7 mg/dL — ABNORMAL LOW (ref 8.9–10.3)
Chloride: 85 mmol/L — ABNORMAL LOW (ref 98–111)
Creatinine, Ser: 0.64 mg/dL (ref 0.44–1.00)
GFR calc Af Amer: >60 mL/min (ref >60)
GFR calc non Af Amer: >60 mL/min (ref >60)
Glucose, Bld: 94 mg/dL (ref 70–99)
Potassium: 3.1 mmol/L — ABNORMAL LOW (ref 3.5–5.1)
Sodium: 134 mmol/L — ABNORMAL LOW (ref 135–145)

## 2018-09-23 MED ORDER — ALPRAZOLAM 0.25 MG PO TABS
0.2500 mg | ORAL_TABLET | Freq: Three times a day (TID) | ORAL | Status: DC | PRN
  Administered 2018-09-23 – 2018-09-24 (×3): 0.25 mg via ORAL
  Filled 2018-09-23 (×2): qty 1

## 2018-09-23 MED ORDER — POTASSIUM CHLORIDE CRYS ER 20 MEQ PO TBCR
40.0000 meq | EXTENDED_RELEASE_TABLET | Freq: Two times a day (BID) | ORAL | Status: AC
  Administered 2018-09-23 (×2): 40 meq via ORAL
  Filled 2018-09-23 (×2): qty 2

## 2018-09-23 MED ORDER — OXYCODONE HCL 5 MG PO TABS
10.0000 mg | ORAL_TABLET | ORAL | Status: DC | PRN
  Administered 2018-09-23 – 2018-09-24 (×6): 15 mg via ORAL
  Filled 2018-09-23 (×6): qty 3

## 2018-09-23 MED ORDER — BENZONATATE 100 MG PO CAPS: 200.0000 mg | ORAL_CAPSULE | Freq: Three times a day (TID) | ORAL | Status: DC

## 2018-09-23 MED ORDER — BENZONATATE 100 MG PO CAPS
100.0000 mg | ORAL_CAPSULE | Freq: Three times a day (TID) | ORAL | Status: DC
  Administered 2018-09-23 (×2): 100 mg via ORAL
  Filled 2018-09-23 (×3): qty 1

## 2018-09-23 NOTE — Progress Notes (Signed)
Patient refused breathing treatments once again.  Keeps replying that she can't take them.  Will continue to monitor.

## 2018-09-23 NOTE — Progress Notes (Addendum)
Autoimmne negative  A - this is UIP with negative autimmune. Likely IPF. Current severity she is end stage  Plan  - she is hospice eligible  - o2  - supportive care - can try 2 week steroids but unclear if beneficial v harmful   Ccm will sign off     SIGNATURE    Dr. Brand Males, M.D., F.C.C.P,  Pulmonary and Critical Care Medicine Staff Physician, [place] Director - Interstitial Lung Disease  Program  Pulmonary [place] [facility] [place], [place], [address]  Pager: [phone], If no answer or between  15:00h - 7:00h: call [phone] Telephone: [phone]  11:00 AM 09/23/2018    Results for BREKYN, HUNTOON (MRN [id number]) as of 09/23/2018 10:58  Ref. Range 09/21/2018 15:58  ds DNA Ab Latest Ref Range: 0 - 9 IU/mL <1  RA Latex Turbid. Latest Ref Range: 0.0 - 13.9 IU/mL 10.1  SSA (Ro) (ENA) Antibody, IgG Latest Ref Range: 0.0 - 0.9 AI <0.2  SSB (La) (ENA) Antibody, IgG Latest Ref Range: 0.0 - 0.9 AI 0.2  Scleroderma (Scl-70) (ENA) Antibody, IgG Latest Ref Range: 0.0 - 0.9 AI <0.2

## 2018-09-23 NOTE — Progress Notes (Signed)
Patient has required multiple visits to room this evening and is requesting pain medication at least every two hours.  She states pain is at a 8 to 10 / 10, and has been on the phone with family stating "they are withholding my pain medicine".  This RN spoke with April this evening that we are not withholding pain medicine that usually pain medicine is a prn and requires the patient asking for it.  Initially at the beginning of the shift she became angry and swearing, however, after that incident an arrangement was made to assess on a schedule for pain and medicate as ordered.  She has been agreeable to this.  Her IV was noted to be removed, she states she does not know how this happened.  This did delay her antibiotic and a Dilaudid dose as it took some time to replace the IV.

## 2018-09-23 NOTE — Progress Notes (Addendum)
PROGRESS NOTE  Christine Ortega TOI:[id number] DOB: October 29, 1960 DOA: 09/15/2018 PCP: Lucia Gaskins, MD  Brief History   Christine Ortega is a 58 y.o. female with medical history significant of COPD oxygen dependent on 3 L, schizophrenia, bipolar disorder, depression, chronic pain, DVT on Eliquis; who presents with complaints of chest pain. Patient had just been hospitalized from 6/5-6/11 for healthcare associated pneumonia.  Then returned to the hospital on 6/13 for worsening shortness of breath for which CT angiogram revealed pneumomediastinum.  However, patient refused to have soluble esophagogram study and ultimately left AGAINST MEDICAL ADVICE on 6/14.  Since leaving the hospital patient reports that she has had progressively worsening pain in her chest and back.  She continues to shortness of breath and cough that is intermittently productive of thick white sputum.  Denies having any fever, chills, nausea, or vomiting.  Upon admission into the emergency department patient was seen to be afebrile, pulse 83-119, respirations 19-30, blood pressures 127/93-153/103, and O2 saturations 89 to 100% on 3 L of nasal cannula oxygen.  Labs revealed WBC 20.5, hemoglobin 11.8, and platelets 497.  Chest x-ray showed fibrotic changes in the lungs right greater than left, but no other interval changes.  CT angiogram of the chest did reveal interval increase in pneumomediastinum, extensive interstitial fibrosis, and clinical findings consistent with pancreatitis.  Patient had been given oxycodone 5-325 mg, morphine 6mg  IV, and Tessalon 100 mg orally. TR[facility] called to admit.  Negative for COVID 4 days ago.  Cardiothoracic Surgery was consulted regarding the pneumomediastinum. Esophagram was obtained that did not demonstrate any extravasation to suggest perforation. It did demonstrate likely presbyesophagus and GERD. They stated that no surgical intervention was necessary.   Pulmonology was consulted the following  morning. The patient has end stage IPF wit severe honeycombing. The pneumomediastinum is due to breaking of blebs. Dr. Chase Caller stated that this was most likely due to autoimmune causes and he ordered some labwork to investigate this. It has not demonstrated likely autoimmune cause.  He further stated that she is not eligible for a lung transplant which would be the only medical help for her. He feels that she is hospice appropriate. He cautions that should she deteriorate he would not intubate her as the risk of barotrauma, pneumothorax, and other complications from mechanical ventilation are greater than the benefit to the patient. Palliative care has been consulted. The patient's husband would want the patient to be DNR and hospice care. The patient has stated her desire for full code status. Dr. Chase Caller endorses the use of High Flow O2 by nasal cannula, should the patient's oxygen saturations drop significantly on NRB.  Consultants   Pulmonology  Palliative Care  Procedures   None  Antibiotics    Zosyn  Subjective  The patient is sitting up in bed. She states that she is feeling tired. Her cough is constant. Oxygen requirements go up and down.  Objective   Vitals:  Vitals:   09/23/18 0834 09/23/18 1141  BP: 122/78   Pulse: (!) 124   Resp: (!) 27   Temp: 98.6 F (37 C) 97.7 F (36.5 C)  SpO2: 95%     Exam:  Constitutional:   The patient is awake, alert, and oriented x 3. No acute distress. Respiratory:   Positive for increased work of breathing, particularly with conversation. She is coughing frequently.  No wheezes, rales, or rhonchi are auscultated.  No tactile fremitus Cardiovascular:   Regular rate and rhythm. Fast rate.  No murmurs,  ectopy, or gallups.  No lateral PMI. No thrills. Abdomen: Patient refuses to allow examination. Musculoskeletal:   No cyanosis, clubbing, or edema Skin:   No rashes, lesions, ulcers  palpation of skin: no induration  or nodules Neurologic:   CN 2-12 intact  Sensation all 4 extremities intact Psychiatric:   Mental status o Mood, affect appropriate: Mood - depressed. Affect agitated. o Orientation to person, place, time   judgment and insight appear poor  I have personally reviewed the following:   Today's Data   Vitals, CBC, BMP  Micro Data   COVID - 19 negative  Imaging   CTA Chest: No {PTX Increase in pneumomediastinum and new subcutaneous emphysema.  CXR: Extensive intersitial fibrotic lung disease with slightly increased interstitial prominence as compared to previous, which could reflect a degree of superimposed interstitial congestion/edema.  Scheduled Meds:  acidophilus   Oral Daily   apixaban  5 mg Oral BID   benzonatate  100 mg Oral TID   budesonide (PULMICORT) nebulizer solution  0.25 mg Nebulization BID   FLUoxetine  40 mg Oral Daily   furosemide  20 mg Intravenous Q12H   ipratropium-albuterol  3 mL Nebulization Q6H   lidocaine  1 patch Transdermal Q24H   Melatonin  18 mg Oral QHS   pantoprazole  40 mg Oral BID   predniSONE  40 mg Oral Q breakfast   sodium chloride flush  3 mL Intravenous Q12H   umeclidinium bromide  1 puff Inhalation Daily   Continuous Infusions:  piperacillin-tazobactam (ZOSYN)  IV 3.375 g (09/23/18 0849)    Principal Problem:   Pneumomediastinum (HCC) Active Problems:   Bipolar disorder (HCC)   Chronic pain   Schizophrenia, paranoid type ([place])   Thrombocytosis ([place])   ILD (interstitial lung disease) ([place])   LOS: 3 days   A & P   Pneumomediastinum: Patient presents complaining of worsening shortness of breath, chest pain, and back pain.  Previously left the hospital AMA after being found to have pneumoperitoneum.  Repeat CT angiogram reveals worsening pneumomediastinum. Barium esophagram was negative and CTS states that there is no indication for their involvement. Dysphagia diet will be started. Continue IV Zosyn. Consult  pulmonology.Pulmonology was consulted the following morning. The patient has end stage IPF wit severe honeycombing. The pneumomediastinum is due to breaking of blebs. Dr. Chase Caller stated that this was most likely due to autoimmune causes and he ordered some labwork to investigate this. He further stated that she is not eligible for a lung transplant which would be the only medical help for her. She decompensated yesterday afternoon, and I confirmed with her that she wanted to be a full code. This morning I spoke with her husband, Christine Ortega, who stated that given the end stage of her disease and the lack of options for treatment, that she should be hospice and a DNR. Palliative care has been consulted and the patient remains a full code for now as this was the patient's decision at this time.  Acute on chronic hypoxic respiratory failure: Pulmonary fibrosis on CTA chest. Saturating in high 90's on 100% NRB. Nebulizer treatments and prednisone continued. Pt uses 3L O2 continuously at home. Dr. Chase Caller endorses the use of high flow oxygen by nasal cannula should her oxygen saturations drop significantly on NRB.  Pulmonary fibrosis: I have discussed the patient with Dr. Posey Rea who will see the patient. He states that the patient's pneumomediastinum is likely due to the bursting of blebs due to her end stage pulmonary fibrosis.  He states that he will see the patient and has ordered an autoimmune work up. There are no options for treatment for the patient and she is not eligible for a lung transplant. Palliative care has been consulted.  SIRS, Leukocytosis: Patient was found to be tachycardic and tachypneic on admission.  WBC elevated at 20.5. Patient currently afebrile, but meet team SIRS criteria. Suspect secondary to above. Lactic acid 1.0. Blood cultures x 2 show no growth.  Chronic respiratory failure with hypoxia, interstitial lung disease: As noted on CT imaging. Continue home breathing treatments and  prednisone.  Pancreatitis: Pt has a history of alcohol abuse. Although her lipase is unremarkable at 28, the CT demonstrates edema and stranding about the tail of the pancrease consistent with pancreatitis. Possibly acute exacerbation of chronic pancreatitis? Pt refuses to allow me to palpate or otherwise examine her abdomen. She denies abdominal pain and states that her pain is in her ribs. However, the patient is eating and does not complain of abdominal pain or nausea following meals. Doubt pancreatitis. Monitor.  Paranoid schizophrenia, depressive disorder: Patient had just recently been seen by telemetry psychiatry on 6/10 who recommended outpatient follow-up as patient previously being [facility] at [facility].   History of DVT on anticoagulation: Patient with history of right lower extremity DVT and left upper extremity DVT during hospitalization in 06/02/18.  Patient has been on Eliquis since that time and took last dose of medicine last night. Continue Eliquis as esophagram negative for perforation/tear of esophagus.  Thrombocytosis: Platelet count 497 on admission. Trended down to 438 today. Monitor.   GERD: Protonix p.o.  I have seen and examined this patient myself. I have spent 52 minutes in her evaluation and care. I have spent more than 50% of this time in coordination of care  with Sr. Ramaswamy. I answered his questions to the best of my ability.  DVT prophylaxis: SCDs Code Status: Full Family Communication: No family present at bedside Disposition Plan: To be determined  Nemiah Kissner, DO [facility] Direct contact: see [email or web address]  7PM-7AM contact night coverage as above  09/23/2018, 2:02 PM  LOS: 1 day

## 2018-09-23 NOTE — Consult Note (Signed)
Consultation Note Date: 09/23/2018   Patient Name: Christine Ortega  DOB: Mar 20, 1961  MRN: [id number]  Age / Sex: 58 y.o., female  PCP: Lucia Gaskins, MD Referring Physician: Karie Kirks, DO  Reason for Consultation: Establishing goals of care and Psychosocial/spiritual support  HPI/Patient Profile: 58 y.o. female  with past medical history of COPD on 3 L, schizoaffective disorder, chronic pain, history of DVT right lower extremity and left upper extremity, seizure disorder admitted on 09/26/2018 with complaints of chest pain radiating to her back.  Chest x-ray revealed fibrotic changes to her lungs, left greater than right, as well as CTA revealed extensive interstitial fibrosis in addition to pneumomediastinum, which has increased and new pancreatitis.  Patient was recently admitted to the hospital from 6/5 to 09/13/2018 with a diagnosis of pneumonia.  She represented to the hospital on 09/15/2018 with worsening shortness of breath and was found to have a pneumomediastinum.  Esophagram was recommended at that time.  At that time she refused and ultimately left AMA  During this admission she has undergone an esophagram.  It is negative for perforation but compared to prior imaging, pneumomediastinum has increased and likely secondary due to blebs breaking loose.  Consult ordered for goals of care.   Clinical Assessment and Goals of Care: Patient seen, chart reviewed.  Patient appears anxious, irritable.  She is quite sick, short of breath and coughing.  Does endorse that her pain is better controlled with a combination of Dilaudid and oxycodone.  She desats with conversation and minimal exertion and especially with coughing.  When I began to discuss with bedside RN medications for cough patient verbalized "you all are trying to overdose me".  Introduced palliative medicine services as an additional resource and  source of support with care directed at her goals and improving her quality of life.  She is oriented x3 but still seems slightly confused probably after medication administration of Dilaudid or oxycodone.  She tells me she does not have COPD and was told she has cancer and pleurisy.  Per chart review, patient had indicated that she wanted full scope of treatment upon admission.  When husband was called with clinical updates regarding the severity of her underlying lung disease he verbalized DNR and more comfort care approach.  Patient at this point is able to speak for herself however she feels very poorly this afternoon and would recommend revisiting with her directly when her respiratory status is better controlled for further goals of care before deferring completely to her healthcare proxy.  Her healthcare proxy where she unable to speak for herself is her husband, Christine Ortega at [phone]   SUMMARY OF RECOMMENDATIONS   Full code Palliative medicine to continue to support patient and family and address goals of care in the setting of extensive interstitial fibrosis, pneumomediastinum Code Status/Advance Care Planning:  Full code    Symptom Management:   Pain: Continue as directed by attending, Dilaudid as needed as well as oxycodone as needed  Cough: Opioids should help cough; she also  has guaifenesin ordered as needed.  I will change Tessalon Perles to 100 mg 3 times a day on a scheduled basis.  If this is not effective, would recommend Hycodan syrup every 6 hours as needed  Anxiety: Continue with Xanax 0.25 as needed  Palliative Prophylaxis:   Aspiration, Bowel Regimen, Delirium Protocol, Eye Care, Frequent Pain Assessment, Oral Care and Turn Reposition  Additional Recommendations (Limitations, Scope, Preferences):  Full Scope Treatment  Psycho-social/Spiritual:   Desire for further Chaplaincy support:no  Additional Recommendations: Referral to Community Resources    Prognosis:   Unable to determine  Discharge Planning: To Be Determined      Primary Diagnoses: Present on Admission: . (Resolved) Pneumoperitoneum . Bipolar disorder ([place]) . Schizophrenia, paranoid type ([place]) . Thrombocytosis ([place]) . Pneumomediastinum ([place]) . Chronic pain   I have reviewed the medical record, interviewed the patient and family, and examined the patient. The following aspects are pertinent.  Past Medical History:  Diagnosis Date  . ADD (attention deficit disorder)   . Anxiety   . Bipolar disorder ([place])   . Chronic pain   . COPD (chronic obstructive pulmonary disease) ([place])   . Depression   . Narcotic abuse in remission ([place])   . Schizophrenia ([place])   . Seizures ([place])    Social History   Socioeconomic History  . Marital status: Single    Spouse name: Not on file  . Number of children: Not on file  . Years of education: Not on file  . Highest education level: Not on file  Occupational History  . Not on file  Social Needs  . Financial resource strain: Patient refused  . Food insecurity    Worry: Patient refused    Inability: Patient refused  . Transportation needs    Medical: Patient refused    Non-medical: Patient refused  Tobacco Use  . Smoking status: Former Smoker    Packs/day: 0.50  . Smokeless tobacco: Never Used  Substance and Sexual Activity  . Alcohol use: No  . Drug use: No  . Sexual activity: Never  Lifestyle  . Physical activity    Days per week: Patient refused    Minutes per session: Patient refused  . Stress: Patient refused  Relationships  . Social Herbalist on phone: Patient refused    Gets together: Patient refused    Attends religious service: Patient refused    Active member of club or organization: Patient refused    Attends meetings of clubs or organizations: Patient refused    Relationship status: Patient refused  Other Topics Concern  . Not on file  Social History Narrative  . Not on file    History reviewed. No pertinent family history. Scheduled Meds: . acidophilus   Oral Daily  . apixaban  5 mg Oral BID  . benzonatate  100 mg Oral TID  . budesonide (PULMICORT) nebulizer solution  0.25 mg Nebulization BID  . FLUoxetine  40 mg Oral Daily  . furosemide  20 mg Intravenous Q12H  . ipratropium-albuterol  3 mL Nebulization Q6H  . lidocaine  1 patch Transdermal Q24H  . Melatonin  18 mg Oral QHS  . pantoprazole  40 mg Oral BID  . predniSONE  40 mg Oral Q breakfast  . sodium chloride flush  3 mL Intravenous Q12H  . umeclidinium bromide  1 puff Inhalation Daily   Continuous Infusions: . piperacillin-tazobactam (ZOSYN)  IV 3.375 g (09/23/18 0849)   PRN Meds:.acetaminophen, albuterol, ALPRAZolam, guaiFENesin-dextromethorphan, HYDROmorphone (DILAUDID) injection,  ondansetron **OR** ondansetron (ZOFRAN) IV, oxyCODONE Medications Prior to Admission:  Prior to Admission medications   Medication Sig Start Date End Date Taking? Authorizing Provider  acetaminophen (TYLENOL) 500 MG tablet Take 1 tablet (500 mg total) by mouth every 8 (eight) hours as needed for moderate pain. 06/08/18 06/08/19 Yes Thurnell Lose, MD  apixaban (ELIQUIS) 5 MG TABS tablet Take 1 tablet (5 mg total) by mouth 2 (two) times daily. 09/13/18  Yes Johnson, Clanford L, MD  benzonatate (TESSALON) 100 MG capsule Take 1 capsule (100 mg total) by mouth 3 (three) times daily as needed for cough. 09/13/18  Yes Johnson, Clanford L, MD  cetirizine (ZYRTEC) 10 MG tablet Take 10 mg by mouth daily.   Yes [provider]  FLUoxetine (PROZAC) 40 MG capsule Take 1 capsule (40 mg total) by mouth daily. For depression and anxiety. 08/08/11  Yes Mashburn, Marlane Hatcher, PA-C  Fluticasone-Umeclidin-Vilant (TRELEGY ELLIPTA) 100-62.5-25 MCG/INH AEPB Inhale 1 puff into the lungs daily.   Yes [provider]  guaiFENesin (MUCINEX) 600 MG 12 hr tablet Take 800 mg by mouth 2 (two) times daily.    Yes [provider]   ipratropium-albuterol (DUONEB) 0.5-2.5 (3) MG/3ML SOLN Take 3 mLs by nebulization every 6 (six) hours.   Yes [provider]  Melatonin 5 MG CAPS Take 20 mg by mouth at bedtime.    Yes [provider]  oxyCODONE (OXY IR/ROXICODONE) 5 MG immediate release tablet Take 0.5 tablets (2.5 mg total) by mouth every 6 (six) hours as needed for severe pain. 09/13/18  Yes Johnson, Clanford L, MD  OXYGEN Inhale 3 L into the lungs continuous.   Yes [provider]  pantoprazole (PROTONIX) 40 MG tablet Take 1 tablet (40 mg total) by mouth 2 (two) times daily. 06/08/18  Yes Thurnell Lose, MD  Probiotic Product (PROBIOTIC PO) Take 1 tablet by mouth daily. Women's probiotic for GI health   Yes [provider]   Allergies  Allergen Reactions  . Nsaids Other (See Comments)    Causes GI bleeding  . Tramadol Hcl     Per Daughter- patient has " sleep paralysis"    Review of Systems  Unable to perform ROS: Severe respiratory distress    Physical Exam Vitals signs and nursing note reviewed.  Constitutional:      Appearance: She is ill-appearing.     Comments: Older female who appears acutely ill She is coughing a great deal  Neck:     Musculoskeletal: Normal range of motion.  Cardiovascular:     Rate and Rhythm: Tachycardia present.  Pulmonary:     Comments: Increased work of breathing at rest Desats with conversation and cough Wearing nonrebreather Neurological:     Mental Status: She is oriented to person, place, and time.     Comments: Somnolent secondary to medication but is oriented x3  Psychiatric:     Comments: Anxious, irritable     Vital Signs: BP 122/78 (BP Location: Left Arm)   Pulse (!) 124   Temp 97.7 F (36.5 C) (Axillary)   Resp (!) 27   Ht 5\' 3"  (1.6 m)   Wt 49.9 kg   SpO2 95%   BMI 19.49 kg/m  Pain Scale: 0-10 POSS *See Group Information*: 1-Acceptable,Awake and alert Pain Score: 7    SpO2: SpO2: 95 % O2 Device:SpO2: 95 % O2 Flow  Rate: .O2 Flow Rate (L/min): 15 L/min  IO: Intake/output summary: No intake or output data in the 24 hours  ending 09/23/18 1341  LBM: Last BM Date: 09/22/18 Baseline Weight: Weight: 49.9 kg Most recent weight: Weight: 49.9 kg     Palliative Assessment/Data:   Flowsheet Rows     Most Recent Value  Intake Tab  Referral Department  Hospitalist  Unit at Time of Referral  Med/Surg Unit  Palliative Care Primary Diagnosis  Pulmonary  Date Notified  09/22/18  Palliative Care Type  New Palliative care  Reason for referral  Clarify Goals of Care, Psychosocial or Spiritual support  Date of Admission  09/13/2018  Date first seen by Palliative Care  09/23/18  # of days Palliative referral response time  1 Day(s)  # of days IP prior to Palliative referral  2  Clinical Assessment  Palliative Performance Scale Score  50%  Pain Max last 24 hours  8  Pain Min Last 24 hours  5  Dyspnea Max Last 24 Hours  8  Dyspnea Min Last 24 hours  7  Nausea Max Last 24 Hours  0  Nausea Min Last 24 Hours  0  Anxiety Max Last 24 Hours  Other (Comment)  Anxiety Min Last 24 Hours  Other (Comment)  Other Max Last 24 Hours  Other (Comment)  Psychosocial & Spiritual Assessment  Palliative Care Outcomes  Patient/Family meeting held?  Yes  Who was at the meeting?  pt  Palliative Care Outcomes  Provided psychosocial or spiritual support  Palliative Care follow-up planned  Yes, Facility      Time In: 1300 Time Out: 1352 Time Total: 52 min Greater than 50%  of this time was spent counseling and coordinating care related to the above assessment and plan.  Signed by: Dory Horn, NP   Please contact Palliative Medicine Team phone at [phone] for questions and concerns.  For individual provider: See Shea Evans

## 2018-09-24 DIAGNOSIS — Z7189 Other specified counseling: Secondary | ICD-10-CM

## 2018-09-24 DIAGNOSIS — Z515 Encounter for palliative care: Secondary | ICD-10-CM

## 2018-09-24 LAB — ANTINUCLEAR ANTIBODIES, IFA: ANA Ab, IFA: NEGATIVE

## 2018-09-24 LAB — BASIC METABOLIC PANEL
Anion gap: 15 (ref 5–15)
BUN: 6 mg/dL (ref 6–20)
CO2: 38 mmol/L — ABNORMAL HIGH (ref 22–32)
Calcium: 8.8 mg/dL — ABNORMAL LOW (ref 8.9–10.3)
Chloride: 83 mmol/L — ABNORMAL LOW (ref 98–111)
Creatinine, Ser: 0.75 mg/dL (ref 0.44–1.00)
GFR calc Af Amer: >60 mL/min (ref >60)
GFR calc non Af Amer: >60 mL/min (ref >60)
Glucose, Bld: 152 mg/dL — ABNORMAL HIGH (ref 70–99)
Potassium: 3.1 mmol/L — ABNORMAL LOW (ref 3.5–5.1)
Sodium: 136 mmol/L (ref 135–145)

## 2018-09-24 LAB — CYCLIC CITRUL PEPTIDE ANTIBODY, IGG/IGA: CCP Antibodies IgG/IgA: 6 units (ref 0–19)

## 2018-09-24 LAB — GLOMERULAR BASEMENT MEMBRANE ANTIBODIES: GBM Ab: 5 units (ref 0–20)

## 2018-09-24 MED ORDER — HYDROCOD POLST-CPM POLST ER 10-8 MG/5ML PO SUER
5.0000 mL | Freq: Two times a day (BID) | ORAL | Status: DC | PRN
  Administered 2018-09-24: 5 mL via ORAL
  Filled 2018-09-24: qty 5

## 2018-09-24 MED ORDER — LORAZEPAM 2 MG/ML IJ SOLN
0.5000 mg | INTRAMUSCULAR | Status: DC | PRN
  Administered 2018-09-24 – 2018-09-25 (×4): 0.5 mg via INTRAVENOUS
  Filled 2018-09-24 (×4): qty 1

## 2018-09-24 MED ORDER — DIPHENHYDRAMINE HCL 12.5 MG/5ML PO ELIX: 12.5000 mg | ORAL_SOLUTION | Freq: Four times a day (QID) | ORAL | Status: DC | PRN

## 2018-09-24 MED ORDER — LORAZEPAM 0.5 MG PO TABS: 0.5000 mg | ORAL_TABLET | ORAL | Status: DC | PRN

## 2018-09-24 MED ORDER — HYDROMORPHONE HCL 1 MG/ML IJ SOLN
2.0000 mg | INTRAMUSCULAR | Status: DC | PRN
  Administered 2018-09-24 – 2018-09-25 (×3): 2 mg via INTRAVENOUS
  Filled 2018-09-24 (×3): qty 2

## 2018-09-24 MED ORDER — DIPHENHYDRAMINE HCL 50 MG/ML IJ SOLN: 12.5000 mg | Freq: Four times a day (QID) | INTRAMUSCULAR | Status: DC | PRN

## 2018-09-24 MED ORDER — HYDROMORPHONE 1 MG/ML IV SOLN
INTRAVENOUS | Status: DC
  Administered 2018-09-24: 14:00:00 via INTRAVENOUS
  Filled 2018-09-24: qty 30

## 2018-09-24 MED ORDER — BENZONATATE 100 MG PO CAPS: 100.0000 mg | ORAL_CAPSULE | Freq: Three times a day (TID) | ORAL | Status: DC | PRN

## 2018-09-24 MED ORDER — HYDROMORPHONE 1 MG/ML IV SOLN
INTRAVENOUS | Status: DC
  Filled 2018-09-24 (×3): qty 30

## 2018-09-24 MED ORDER — GUAIFENESIN-CODEINE 100-10 MG/5ML PO SOLN
10.0000 mL | Freq: Once | ORAL | Status: AC
  Administered 2018-09-24: 10 mL via ORAL
  Filled 2018-09-24: qty 10

## 2018-09-24 NOTE — Progress Notes (Signed)
Pt husband and niece at bedside and updated on pt status.

## 2018-09-24 NOTE — Discharge Instructions (Signed)

## 2018-09-24 NOTE — Plan of Care (Signed)
  Problem: Education: Goal: Knowledge of General Education information will improve Description: Including pain rating scale, medication(s)/side effects and non-pharmacologic comfort measures Outcome: Not Progressing   Problem: Clinical Measurements: Goal: Ability to maintain clinical measurements within normal limits will improve Outcome: Not Progressing Goal: Respiratory complications will improve Outcome: Not Progressing   Problem: Pain Managment: Goal: General experience of comfort will improve Outcome: Not Progressing

## 2018-09-24 NOTE — Progress Notes (Signed)
PROGRESS NOTE    Christine Ortega  OPF:[id number] DOB: 1960/05/04 DOA: 09/17/2018 PCP: Lucia Gaskins, MD    Brief Narrative:  58 y.o.femalewith medical history significant ofCOPD oxygen dependent on 3 L, schizophrenia, bipolar disorder, depression, chronic pain, DVT on Eliquis;who presents with complaints of chest pain. Patient had just been hospitalized from 6/5-6/11 for healthcare associated pneumonia. Then returned to the hospital on 6/13for worsening shortness of breath for which CT angiogram revealed pneumomediastinum. However, patient refused to have soluble esophagogram study and ultimately left AGAINST MEDICAL ADVICE on 6/14. Since leaving the hospital patient reports that she has had progressively worsening pain in her chest and back. She continues to shortness of breath and cough that is intermittently productive ofthick white sputum. Denies having any fever, chills, nausea, or vomiting.  Upon admission into the emergency department patient was seen to be afebrile, pulse 83-119, respirations 19-30, blood pressures 127/93-153/103, and O2 saturations 89 to 100% on 3 L of nasal cannula oxygen. Labs revealed WBC 20.5, hemoglobin 11.8, and platelets 497. Chest x-ray showed fibrotic changes in the lungs right greater than left, but no other interval changes. CT angiogram of the chest did reveal interval increase in pneumomediastinum, extensive interstitial fibrosis, and clinical findings consistent with pancreatitis. Patient had been given oxycodone 5-325 mg, morphine 6mg  IV, and Tessalon 100 mg orally. TRH called to admit. Negative for COVID 4 days ago.  Cardiothoracic Surgery was consulted regarding the pneumomediastinum. Esophagram was obtained that did not demonstrate any extravasation to suggest perforation. It did demonstrate likely presbyesophagus and GERD. They stated that no surgical intervention was necessary.   Pulmonology was consulted the following morning. The  patient has end stage IPF wit severe honeycombing. The pneumomediastinum is due to breaking of blebs. Dr. Chase Caller stated that this was most likely due to autoimmune causes and he ordered some labwork to investigate this. It has not demonstrated likely autoimmune cause.  He further stated that she is not eligible for a lung transplant which would be the only medical help for her. He feels that she is hospice appropriate. He cautions that should she deteriorate he would not intubate her as the risk of barotrauma, pneumothorax, and other complications from mechanical ventilation are greater than the benefit to the patient. Palliative care has been consulted. The patient's husband would want the patient to be DNR and hospice care. The patient has stated her desire for full code status. Dr. Chase Caller endorses the use of High Flow O2 by nasal cannula, should the patient's oxygen saturations drop significantly on NRB.  Assessment & Plan:   Principal Problem:   Pneumomediastinum ([place]) Active Problems:   Bipolar disorder (HCC)   Chronic pain   Schizophrenia, paranoid type ([place])   Thrombocytosis ([place])   ILD (interstitial lung disease) ([place])   Palliative care by specialist  Pneumomediastinum: -Patient presents complaining of worsening shortness of breath, chest pain, and back pain.  -Previously left the hospital AMAafter being found to have pneumoperitoneum.  -Repeat CT angiogram reveals worsening pneumomediastinum.  -Barium esophagram was negative and CTS states that there is no indication for their involvement.  -Continued on IV Zosyn.  -Pulmonology was consulted. The patient noted to have end stage IPF wit severe honeycombing and per Pulm, not eligible for a lung transplant -Recent decompensation on 6/21 noted. -Appreciate Palliative Care input. Now DNR. PCA started for air hunger.  Pulmonary fibrosis: - Pulmonary was consulted. Per Pulm, there are no options for treatment for the patient and  she is not eligible  for a lung transplant.  - Appreciate Palliative Care input per above  Sepsis secondary to pneumodediastinum present on admit: -Patient was found to be tachycardic and tachypneic on admission. -WBC elevated at 20.5.  -Blood cultures x 2 show no growth.  Chronic respiratory failure with hypoxia, interstitial lung disease:  -As noted on CT imaging. Continue home breathing treatmentsand prednisone.  Pancreatitis:  -Pt has a history of alcohol abuse.  -CT demonstrates edema and stranding about the tail of the pancrease consistent with pancreatitis.  -Cont with analgesia and supportive care  Paranoid schizophrenia, depressive disorder:  -Patient had just recently been seen by telemetry psychiatry on 6/10 who recommended outpatient follow-up as patient previously being seen at [facility].   History of DVT on anticoagulation:  -Patient with history of right lower extremity DVT and left upper extremity DVT during hospitalization in 06/02/18.  -Patient has been continued on Eliquis.  Thrombocytosis: -Platelet count 497 on admission.  -Stable -Repeat cbc in AM  GERD: Protonixp.o.  DVT prophylaxis: Eliquis Code Status: DNR Family Communication: Pt in room, family not at bedside Disposition Plan: Uncertain at this time  Consultants:   Palliative Care  Procedures:     Antimicrobials: Anti-infectives (From admission, onward)   Start     Dose/Rate Route Frequency Ordered Stop   09/26/2018 1500  piperacillin-tazobactam (ZOSYN) IVPB 3.375 g     3.375 g 12.5 mL/hr over 240 Minutes Intravenous Every 8 hours 09/26/2018 1444     09/23/2018 1445  piperacillin-tazobactam (ZOSYN) IVPB 3.375 g  Status:  Discontinued     3.375 g 100 mL/hr over 30 Minutes Intravenous Every 8 hours 09/09/2018 1438 10/01/2018 1444       Subjective: Reports feeling somewhat better. Eager to start using PCA this AM  Objective: Vitals:   09/24/18 0740 09/24/18 0830 09/24/18 1229  09/24/18 1333  BP:  121/69 120/60   Pulse:  (!) 119 (!) 104   Resp:  (!) 29 16 15   Temp:  98.1 F (36.7 C) 99.3 F (37.4 C)   TempSrc:  Axillary Oral   SpO2: 93% 91% 97% 91%  Weight:      Height:        Intake/Output Summary (Last 24 hours) at 09/24/2018 1442 Last data filed at 09/24/2018 9449 Gross per 24 hour  Intake -  Output 200 ml  Net -200 ml   Filed Weights   09/15/2018 0904  Weight: 49.9 kg    Examination:  General exam: Appears calm and comfortable  Respiratory system: Clear to auscultation. Respiratory effort increased Cardiovascular system: S1 & S2 heard, RRR Gastrointestinal system: Abdomen is nondistended, flat appearing Central nervous system: Alert and oriented. No focal neurological deficits. Extremities: Symmetric 5 x 5 power. Skin: No rashes, lesions Psychiatry: Judgement and insight appear normal. Mood & affect appropriate.   Data Reviewed: I have personally reviewed following labs and imaging studies  CBC: Recent Labs  Lab 09/19/2018 1014 09/21/18 0711 09/22/18 0310  WBC 20.5* 20.2* 19.5*  NEUTROABS 16.1*  --   --   HGB 11.8* 10.6* 9.8*  HCT 36.9 33.1* 30.3*  MCV 95.8 96.2 95.0  PLT 497* 453* 675*   Basic Metabolic Panel: Recent Labs  Lab 09/21/2018 1014 09/21/18 0711 09/22/18 0310 09/23/18 0803 09/24/18 0932  NA 137 134* 138 134* 136  K 3.7 3.3* 3.8 3.1* 3.1*  CL 100 99 100 85* 83*  CO2 27 27 28  37* 38*  GLUCOSE 103* 114* 92 94 152*  BUN 6 5* <5* <5* 6  CREATININE 0.51 0.63 0.58 0.64 0.75  CALCIUM 8.7* 8.1* 8.4* 8.7* 8.8*   GFR: Estimated Creatinine Clearance: 61.1 mL/min (by C-G formula based on SCr of 0.75 mg/dL). Liver Function Tests: No results for input(s): AST, ALT, ALKPHOS, BILITOT, PROT, ALBUMIN in the last 168 hours. Recent Labs  Lab 10/01/2018 1611  LIPASE 28   No results for input(s): AMMONIA in the last 168 hours. Coagulation Profile: Recent Labs  Lab 09/25/2018 1611  INR 1.0   Cardiac Enzymes: Recent Labs  Lab  09/21/18 1558 09/21/18 2232 09/22/18 0310  TROPONINI <0.03 <0.03 <0.03   BNP (last 3 results) No results for input(s): PROBNP in the last 8760 hours. HbA1C: No results for input(s): HGBA1C in the last 72 hours. CBG: No results for input(s): GLUCAP in the last 168 hours. Lipid Profile: No results for input(s): CHOL, HDL, LDLCALC, TRIG, CHOLHDL, LDLDIRECT in the last 72 hours. Thyroid Function Tests: No results for input(s): TSH, T4TOTAL, FREET4, T3FREE, THYROIDAB in the last 72 hours. Anemia Panel: No results for input(s): VITAMINB12, FOLATE, FERRITIN, TIBC, IRON, RETICCTPCT in the last 72 hours. Sepsis Labs: Recent Labs  Lab 09/23/2018 1611  LATICACIDVEN 1.0    Recent Results (from the past 240 hour(s))  SARS Coronavirus 2 (CEPHEID - Performed in [facility] hospital lab), Hosp Order     Status: None   Collection Time: 09/16/18 12:56 AM   Specimen: Nasopharyngeal Swab  Result Value Ref Range Status   SARS Coronavirus 2 NEGATIVE NEGATIVE Final    Comment: (NOTE) If result is NEGATIVE SARS-CoV-2 target nucleic acids are NOT DETECTED. The SARS-CoV-2 RNA is generally detectable in upper and lower  respiratory specimens during the acute phase of infection. The lowest  concentration of SARS-CoV-2 viral copies this assay can detect is 250  copies / mL. A negative result does not preclude SARS-CoV-2 infection  and should not be used as the sole basis for treatment or other  patient management decisions.  A negative result may occur with  improper specimen collection / handling, submission of specimen other  than nasopharyngeal swab, presence of viral mutation(s) within the  areas targeted by this assay, and inadequate number of viral copies  (<250 copies / mL). A negative result must be combined with clinical  observations, patient history, and epidemiological information. If result is POSITIVE SARS-CoV-2 target nucleic acids are DETECTED. The SARS-CoV-2 RNA is generally  detectable in upper and lower  respiratory specimens dur ing the acute phase of infection.  Positive  results are indicative of active infection with SARS-CoV-2.  Clinical  correlation with patient history and other diagnostic information is  necessary to determine patient infection status.  Positive results do  not rule out bacterial infection or co-infection with other viruses. If result is PRESUMPTIVE POSTIVE SARS-CoV-2 nucleic acids MAY BE PRESENT.   A presumptive positive result was obtained on the submitted specimen  and confirmed on repeat testing.  While 2019 novel coronavirus  (SARS-CoV-2) nucleic acids may be present in the submitted sample  additional confirmatory testing may be necessary for epidemiological  and / or clinical management purposes  to differentiate between  SARS-CoV-2 and other Sarbecovirus currently known to infect humans.  If clinically indicated additional testing with an alternate test  methodology [phone]) is advised. The SARS-CoV-2 RNA is generally  detectable in upper and lower respiratory sp ecimens during the acute  phase of infection. The expected result is Negative. Fact Sheet for Patients:  StrictlyIdeas.no Fact Sheet for Healthcare Providers: BankingDealers.co.za This test  is not yet approved or cleared by the [place] and has been authorized for detection and/or diagnosis of SARS-CoV-2 by FDA under an Emergency Use Authorization (EUA).  This EUA will remain in effect (meaning this test can be used) for the duration of the COVID-19 declaration under Section 564(b)(1) of the Act, 21 U.S.C. section 360bbb-3(b)(1), unless the authorization is terminated or revoked sooner. Performed at [facility], [address] [place], [place] [address]   Novel Coronavirus,NAA,(SEND-OUT TO REF LAB - TAT 24-48 hrs); Hosp Order     Status: None   Collection Time: 09/21/2018 10:51 AM   Specimen:  Nasopharyngeal Swab; Respiratory  Result Value Ref Range Status   SARS-CoV-2, NAA NOT DETECTED NOT DETECTED Final    Comment: (NOTE) This test was developed and its performance characteristics determined by [employer]. This test has not been FDA cleared or approved. This test has been authorized by FDA under an Emergency Use Authorization (EUA). This test is only authorized for the duration of time the declaration that circumstances exist justifying the authorization of the emergency use of in vitro diagnostic tests for detection of SARS-CoV-2 virus and/or diagnosis of COVID-19 infection under section 564(b)(1) of the Act, 21 U.S.C. 510CHE-5(I)(7), unless the authorization is terminated or revoked sooner. When diagnostic testing is negative, the possibility of a false negative result should be considered in the context of a patient's recent exposures and the presence of clinical signs and symptoms consistent with COVID-19. An individual without symptoms of COVID-19 and who is not shedding SARS-CoV-2 virus would expect to have a negative (not detected) result in this assay. Performed  At: [facility] [address] [place], [place] [id number] Rush Farmer MD RW:[id number]    [place]  Final    Comment: Performed at [facility], [place] [address], [place], [place] [address]  Culture, blood (routine x 2)     Status: None (Preliminary result)   Collection Time: 10/01/2018  4:18 PM   Specimen: BLOOD RIGHT HAND  Result Value Ref Range Status   Specimen Description BLOOD RIGHT HAND  Final   Special Requests   Final    BOTTLES DRAWN AEROBIC ONLY Blood Culture results may not be optimal due to an inadequate volume of blood received in culture bottles   Culture   Final    NO GROWTH 4 DAYS Performed at [facility], [place] [address] [place], [place] [address]    Report Status PENDING  Incomplete  Culture, blood (routine x 2)     Status:  None (Preliminary result)   Collection Time: 09/06/2018  4:23 PM   Specimen: BLOOD LEFT HAND  Result Value Ref Range Status   Specimen Description BLOOD LEFT HAND  Final   Special Requests   Final    BOTTLES DRAWN AEROBIC ONLY Blood Culture results may not be optimal due to an inadequate volume of blood received in culture bottles   Culture   Final    NO GROWTH 4 DAYS Performed at [facility], [place] [address] [place],  [address]    Report Status PENDING  Incomplete  Respiratory Panel by PCR     Status: None   Collection Time: 09/21/18 11:11 PM  Result Value Ref Range Status   Adenovirus NOT DETECTED NOT DETECTED Final   Coronavirus 229E NOT DETECTED NOT DETECTED Final    Comment: (NOTE) The Coronavirus on the Respiratory Panel, DOES NOT test for the novel  Coronavirus (2019 nCoV)  Coronavirus HKU1 NOT DETECTED NOT DETECTED Final   Coronavirus NL63 NOT DETECTED NOT DETECTED Final   Coronavirus OC43 NOT DETECTED NOT DETECTED Final   Metapneumovirus NOT DETECTED NOT DETECTED Final   Rhinovirus / Enterovirus NOT DETECTED NOT DETECTED Final   Influenza A NOT DETECTED NOT DETECTED Final   Influenza B NOT DETECTED NOT DETECTED Final   Parainfluenza Virus 1 NOT DETECTED NOT DETECTED Final   Parainfluenza Virus 2 NOT DETECTED NOT DETECTED Final   Parainfluenza Virus 3 NOT DETECTED NOT DETECTED Final   Parainfluenza Virus 4 NOT DETECTED NOT DETECTED Final   Respiratory Syncytial Virus NOT DETECTED NOT DETECTED Final   Bordetella pertussis NOT DETECTED NOT DETECTED Final   Chlamydophila pneumoniae NOT DETECTED NOT DETECTED Final   Mycoplasma pneumoniae NOT DETECTED NOT DETECTED Final    Comment: Performed at [facility], [place] [address] [place],  [address]     Radiology Studies: No results found.  Scheduled Meds: . apixaban  5 mg Oral BID  . budesonide (PULMICORT) nebulizer solution  0.25 mg Nebulization BID  . furosemide  20 mg Intravenous Q12H  .  HYDROmorphone   Intravenous Q4H  . ipratropium-albuterol  3 mL Nebulization Q6H  . lidocaine  1 patch Transdermal Q24H  . pantoprazole  40 mg Oral BID  . predniSONE  40 mg Oral Q breakfast  . sodium chloride flush  3 mL Intravenous Q12H  . umeclidinium bromide  1 puff Inhalation Daily   Continuous Infusions: . piperacillin-tazobactam (ZOSYN)  IV 3.375 g (09/24/18 3343)     LOS: 4 days   Marylu Lund, MD [facility] Pager On Amion  If 7PM-7AM, please contact night-coverage 09/24/2018, 2:42 PM

## 2018-09-24 NOTE — Progress Notes (Signed)
Daily Progress Note   Patient Name: Christine Ortega       Date: 09/24/2018 DOB: 10-06-1960  Age: 58 y.o. MRN#: [id number] Attending Physician: Christine Hazel, MD Primary Care Physician: Christine Gaskins, MD Admit Date: 10/02/2018  Reason for Consultation/Follow-up: Establishing goals of care  Subjective: Having significant pain and very uncomfortable. Holding HFNC out of nose and says it is too high.   Length of Stay: 4  Current Medications: Scheduled Meds:   apixaban  5 mg Oral BID   budesonide (PULMICORT) nebulizer solution  0.25 mg Nebulization BID   furosemide  20 mg Intravenous Q12H   HYDROmorphone   Intravenous Q4H   ipratropium-albuterol  3 mL Nebulization Q6H   lidocaine  1 patch Transdermal Q24H   pantoprazole  40 mg Oral BID   predniSONE  40 mg Oral Q breakfast   sodium chloride flush  3 mL Intravenous Q12H   umeclidinium bromide  1 puff Inhalation Daily    Continuous Infusions:  piperacillin-tazobactam (ZOSYN)  IV 3.375 g (09/24/18 0643)    PRN Meds: acetaminophen, albuterol, benzonatate, diphenhydrAMINE **OR** diphenhydrAMINE, guaiFENesin-dextromethorphan, HYDROmorphone (DILAUDID) injection, LORazepam **OR** LORazepam, ondansetron **OR** ondansetron (ZOFRAN) IV  Physical Exam Vitals signs and nursing note reviewed.  Constitutional:      General: She is in acute distress.     Appearance: She is ill-appearing.     Comments: Frail   Cardiovascular:     Rate and Rhythm: Tachycardia present.  Pulmonary:     Effort: Tachypnea, accessory muscle usage and respiratory distress present.  Abdominal:     General: Abdomen is flat.  Neurological:     Mental Status: She is alert. She is confused.             Vital Signs: BP 121/69 (BP Location: Left Arm)     Pulse (!) 119    Temp 98.1 F (36.7 C) (Axillary)    Resp (!) 29    Ht '5\' 3"'  (1.6 m)    Wt 49.9 kg    SpO2 91%    BMI 19.49 kg/m  SpO2: SpO2: 91 % O2 Device: O2 Device: NRB O2 Flow Rate: O2 Flow Rate (L/min): 15 L/min  Intake/output summary:   Intake/Output Summary (Last 24 hours) at 09/24/2018 1053 Last data filed at 09/24/2018 5300 Gross per 24 hour  Intake --  Output 200 ml  Net -200 ml   LBM: Last BM Date: 09/23/18 Baseline Weight: Weight: 49.9 kg Most recent weight: Weight: 49.9 kg       Palliative Assessment/Data:    Flowsheet Rows     Most Recent Value  Intake Tab  Referral Department  Hospitalist  Unit at Time of Referral  Med/Surg Unit  Palliative Care Primary Diagnosis  Pulmonary  Date Notified  09/22/18  Palliative Care Type  New Palliative care  Reason for referral  Clarify Goals of Care, Psychosocial or Spiritual support  Date of Admission  09/26/2018  Date first seen by Palliative Care  09/23/18  # of days Palliative referral response time  1 Day(s)  # of days IP prior to Palliative referral  2  Clinical Assessment  Palliative Performance Scale Score  50%  Pain Max last 24 hours  8  Pain Min Last 24 hours  5  Dyspnea Max Last 24 Hours  8  Dyspnea Min Last 24 hours  7  Nausea Max Last 24 Hours  0  Nausea Min Last 24 Hours  0  Anxiety Max Last 24 Hours  Other (Comment)  Anxiety Min Last 24 Hours  Other (Comment)  Other Max Last 24 Hours  Other (Comment)  Psychosocial & Spiritual Assessment  Palliative Care Outcomes  Patient/Family meeting held?  Yes  Who was at the meeting?  pt  Palliative Care Outcomes  Provided psychosocial or spiritual support  Palliative Care follow-up planned  Yes, Facility      Patient Active Problem List   Diagnosis Date Noted   Palliative care by specialist    ILD (interstitial lung disease) ([place])    Pneumomediastinum ([place]) 09/16/2018   Normocytic anemia 09/08/2018   GERD (gastroesophageal reflux disease)  09/08/2018   Hyperglycemia 09/08/2018   COPD with acute exacerbation ([place]) 09/08/2018   Pulmonary fibrosis ([place]) 09/08/2018   Acute on chronic respiratory failure with hypoxia ([place]) 09/07/2018   History of DVT (deep vein thrombosis) 09/07/2018   HCAP (healthcare-associated pneumonia) 08/01/2018   Bowel perforation ([place])    Acute blood loss anemia 05/19/2018   Thrombocytosis ([place]) 05/19/2018   Hypokalemia    Schizophrenia, paranoid type ([place]) 08/08/2011   Narcotic abuse in remission ([place])    Bipolar disorder ([place])    Chronic pain    ADD (attention deficit disorder)     Palliative Care Assessment & Plan   HPI: 58 yo female with PMH COPD, bipolar disorder, schizophrenia, seizures, anxiety, depression, DVT on Eliquis admitted on 09/19/2018 with chest pain r/t acute/end stage pulmonary fibrosis (not eligible for lung transplant per notes) with sepsis pneumomediastinum (though to be r/t ruptured blebs with pulmonary fibrosis). Prognosis grim.   Assessment: I met today at Christine Ortega' bedside. We discussed her condition and she tells me that she has lung cancer. We discussed that she does have a severe lung disease that is unlikely to improve. She begins to ask more information but stops and says "I don't want to know." Her level of discomfort is worse today and she is hoping for better relief. I told her that the best way we can help her at this stage is to focus on her symptoms and relieve her symptoms burden so she is not struggling in pain or to breathe. She replies "thank you." I further explained that we can continue current interventions as she tells me that she wants to continue "to fight this" but that we can also keep her comfortable.  I told her that if the time comes for her heart or lungs declined then we would not place her on breathing machines or pump on her chest with CPR that would cause her further discomfort and she agrees she does not want this now. She would like to  go home and we discussed that I will ask her husband come to the hospital so we can discuss and plan.   I called and spoke with her husband and we will allow him to come be with his wife. He is hopeful that she can return home with hospice. We did discuss that the barriers to hospice would be the high amount of oxygen that she is requiring (10L really is the most that can be provided at home) as well as her symptom burden. If we can get her symptoms better managed on a regimen that can be given at home and if her oxygen needs allow then we can pursue this option. He did ask if I believe this is possible and I told him that I have my doubts but only time will tell. We discussed that prognosis could be days but could be weeks depending on how she does the next ~24 hours we will know better. I explained that this is why I wanted to allow them to be with her here in the hospital in case she continues to further decline as I fear she may. This conversation began on phone but was continued at bedside when husband arrived.   Emotional support provided.   Recommendations/Plan:  Comfort is focus of care but will continue respiratory interventions as desired by patient.   Requiring frequent dilaudid pushes with relief but does not last long. Patient agrees to dilaudid PCA to give her some control as well. Dilaudid infusion began at basal rate 0.5 mg/hr (increased to 1 mg/hr) with bolus 1 mg every 30 min (increased to every 15 min) as needed.    Ativan prn agitation/anxiety.   Minimized pill burden for comfort.   Goals of Care and Additional Recommendations:  Limitations on Scope of Treatment:Comfort focused care  Code Status:  DNR  Prognosis:   Days to weeks  Discharge Planning:  Hospital death vs home with hospice  Care plan was discussed with RN and Dr. Wyline Copas.   Thank you for allowing the Palliative Medicine Team to assist in the care of this patient.   Time In/Out: 0945-1045,1450-1520  Total Time 90 min Prolonged Time Billed  yes       Greater than 50%  of this time was spent counseling and coordinating care related to the above assessment and plan.  Vinie Sill, NP Palliative Medicine Team Pager # [phone] (M-F 8a-5p) Team Phone # [phone] (Nights/Weekends)

## 2018-09-25 LAB — CULTURE, BLOOD (ROUTINE X 2)
Culture: NO GROWTH
Culture: NO GROWTH

## 2018-09-25 LAB — MPO/PR-3 (ANCA) ANTIBODIES
ANCA Proteinase 3: <3.5 U/mL (ref 0.0–3.5)
Myeloperoxidase Abs: <9 U/mL (ref 0.0–9.0)

## 2018-09-25 MED ORDER — LORAZEPAM 2 MG/ML IJ SOLN
2.0000 mg | Freq: Once | INTRAMUSCULAR | Status: AC
  Administered 2018-09-25: 2 mg via INTRAVENOUS
  Filled 2018-09-25: qty 1

## 2018-09-25 MED ORDER — HALOPERIDOL LACTATE 5 MG/ML IJ SOLN
2.0000 mg | Freq: Four times a day (QID) | INTRAMUSCULAR | Status: DC | PRN
  Filled 2018-09-25: qty 1

## 2018-09-25 MED ORDER — SODIUM CHLORIDE 0.9 % IV SOLN
2.0000 mg/h | INTRAVENOUS | Status: DC
  Administered 2018-09-25 – 2018-09-26 (×3): 2 mg/h via INTRAVENOUS
  Administered 2018-09-27: 4 mg/h via INTRAVENOUS
  Filled 2018-09-25 (×5): qty 5

## 2018-09-25 MED ORDER — HYDROMORPHONE HCL 1 MG/ML IJ SOLN
2.0000 mg | INTRAMUSCULAR | Status: AC
  Administered 2018-09-25: 2 mg via INTRAVENOUS
  Filled 2018-09-25: qty 2

## 2018-09-25 MED ORDER — HYDROMORPHONE BOLUS VIA INFUSION
2.0000 mg | INTRAVENOUS | Status: DC | PRN
  Administered 2018-09-25 – 2018-09-26 (×8): 2 mg via INTRAVENOUS
  Filled 2018-09-25: qty 2

## 2018-09-25 MED ORDER — SODIUM CHLORIDE 0.9 % IV SOLN: 2.0000 mg/h | INTRAVENOUS | Status: DC

## 2018-09-25 MED ORDER — GLYCOPYRROLATE 0.2 MG/ML IJ SOLN
0.4000 mg | INTRAMUSCULAR | Status: DC | PRN
  Administered 2018-09-26: 0.4 mg via INTRAVENOUS
  Filled 2018-09-25: qty 2

## 2018-09-25 MED ORDER — MORPHINE SULFATE (PF) 4 MG/ML IV SOLN
4.0000 mg | INTRAVENOUS | Status: AC
  Administered 2018-09-25: 4 mg via INTRAVENOUS
  Filled 2018-09-25: qty 1

## 2018-09-25 MED ORDER — DIPHENHYDRAMINE HCL 50 MG/ML IJ SOLN: 12.5000 mg | Freq: Four times a day (QID) | INTRAMUSCULAR | Status: DC | PRN

## 2018-09-25 MED ORDER — LORAZEPAM 2 MG/ML IJ SOLN
1.0000 mg/h | INTRAVENOUS | Status: DC
  Administered 2018-09-25 – 2018-09-26 (×2): 1 mg/h via INTRAVENOUS
  Filled 2018-09-25 (×3): qty 25

## 2018-09-25 MED ORDER — LORAZEPAM 2 MG/ML IJ SOLN
0.5000 mg | Freq: Once | INTRAMUSCULAR | Status: AC
  Administered 2018-09-25: 0.5 mg via INTRAVENOUS
  Filled 2018-09-25: qty 1

## 2018-09-25 MED ORDER — LORAZEPAM BOLUS VIA INFUSION
1.0000 mg | INTRAVENOUS | Status: DC | PRN
  Administered 2018-09-26 (×2): 1 mg via INTRAVENOUS
  Filled 2018-09-25: qty 2

## 2018-09-25 NOTE — Progress Notes (Signed)
Palliative:  HPI: 58 yo female with PMH COPD, bipolar disorder, schizophrenia, seizures, anxiety, depression, DVT on Eliquis admitted on 09/18/2018 with chest pain r/t acute/end stage pulmonary fibrosis (not eligible for lung transplant per notes) with sepsis pneumomediastinum (though to be r/t ruptured blebs with pulmonary fibrosis). Prognosis grim.    I came directly to Christine Ortega' bedside as I was paged by RN and Christine Ortega is not responsive or following commands but is moaning and rolling in bed and very uncomfortable. She has had significant decline overnight. Her daughter, April, and niece are at bedside distressed by Christine Ortega' suffering. Family have been clear about their priority being her comfort. I discussed changing PCA to dilaudid infusion given she can no longer administer bolus doses and will add ativan infusion as she has responded well to prn ativan but this does not provide relief for long. Spent some time providing support and reassurance to family at bedside. Discussed signs of progression at EOL with niece. I ordered bolus doses to be administered by RN during my visit and be the time I left Christine Ortega was more comfortable and resting more peacefully.   I returned this evening at the end of my shift to ensure Christine Ortega continues to be comfortable on current regimen. Her husband and son are at bedside. Husband is happy that she is comfortable but struggling with watching her at EOL and unable to respond to him. He reflects on their life together with 14 yrs of marriage and 3 children. He reflects on the good and bad times. Therapeutic listening and emotional support provided.   Exam (at initial visit): Minimally responsive. Distressed with tachypnea and accessory muscle usage. Extremely tense with tight fist and moaning. Grimacing.   Plan: - Full comfort care, anticipate hospital death.  - Dyspnea/pain: PCA changed to dilaudid infusion 2 mg/hr with 2 mg IV bolus every 15 min prn.   - Anxiety/agitation: Ativan infusion 1 mg/hr with IV 1-2 mg bolus every 30 min prn.  - Robinul prn secretions/gurgling.   [id number], [id number] 80 min  Vinie Sill, NP Palliative Medicine Team Pager [phone] (Please see [email or web address] for schedule) Team Phone [phone]    Greater than 50%  of this time was spent counseling and coordinating care related to the above assessment and plan

## 2018-09-25 NOTE — Progress Notes (Signed)
Patient's dilaudid PCA discontinued with Joesph Fillers, RN. PCA was empty and no medication was wasted. New dilaudid drip was started.

## 2018-09-25 NOTE — Progress Notes (Signed)
PROGRESS NOTE    Christine Ortega  MCN:[id number] DOB: 1960/09/17 DOA: 09/23/2018 PCP: Lucia Gaskins, MD    Brief Narrative:  58 y.o.femalewith medical history significant ofCOPD oxygen dependent on 3 L, schizophrenia, bipolar disorder, depression, chronic pain, DVT on Eliquis;who presents with complaints of chest pain. Patient had just been hospitalized from 6/5-6/11 for healthcare associated pneumonia. Then returned to the hospital on 6/13for worsening shortness of breath for which CT angiogram revealed pneumomediastinum. However, patient refused to have soluble esophagogram study and ultimately left AGAINST MEDICAL ADVICE on 6/14. Since leaving the hospital patient reports that she has had progressively worsening pain in her chest and back. She continues to shortness of breath and cough that is intermittently productive ofthick white sputum. Denies having any fever, chills, nausea, or vomiting.  Upon admission into the emergency department patient was seen to be afebrile, pulse 83-119, respirations 19-30, blood pressures 127/93-153/103, and O2 saturations 89 to 100% on 3 L of nasal cannula oxygen. Labs revealed WBC 20.5, hemoglobin 11.8, and platelets 497. Chest x-ray showed fibrotic changes in the lungs right greater than left, but no other interval changes. CT angiogram of the chest did reveal interval increase in pneumomediastinum, extensive interstitial fibrosis, and clinical findings consistent with pancreatitis. Patient had been given oxycodone 5-325 mg, morphine 6mg  IV, and Tessalon 100 mg orally. TRH called to admit. Negative for COVID 4 days ago.  Cardiothoracic Surgery was consulted regarding the pneumomediastinum. Esophagram was obtained that did not demonstrate any extravasation to suggest perforation. It did demonstrate likely presbyesophagus and GERD. They stated that no surgical intervention was necessary.   Pulmonology was consulted the following morning. The  patient has end stage IPF wit severe honeycombing. The pneumomediastinum is due to breaking of blebs. Dr. Chase Caller stated that this was most likely due to autoimmune causes and he ordered some labwork to investigate this. It has not demonstrated likely autoimmune cause.  He further stated that she is not eligible for a lung transplant which would be the only medical help for her. He feels that she is hospice appropriate. He cautions that should she deteriorate he would not intubate her as the risk of barotrauma, pneumothorax, and other complications from mechanical ventilation are greater than the benefit to the patient. Palliative care has been consulted. The patient's husband would want the patient to be DNR and hospice care. The patient has stated her desire for full code status. Dr. Chase Caller endorses the use of High Flow O2 by nasal cannula, should the patient's oxygen saturations drop significantly on NRB.  Assessment & Plan:   Principal Problem:   Pneumomediastinum ([place]) Active Problems:   Bipolar disorder ([facility])   Chronic pain   Schizophrenia, paranoid type ([place])   Thrombocytosis ([place])   ILD (interstitial lung disease) ([place])   Palliative care by specialist   Goals of care, counseling/discussion   Palliative care encounter  Pneumomediastinum: -Patient presents complaining of worsening shortness of breath, chest pain, and back pain.  -Previously left the hospital AMAafter being found to have pneumoperitoneum.  -Repeat CT angiogram reveals worsening pneumomediastinum.  -Barium esophagram was negative and CTS states that there is no indication for their involvement.  -Continued on IV Zosyn.  -Pulmonology was consulted. The patient noted to have end stage IPF wit severe honeycombing and per Pulm, not eligible for a lung transplant -Recent decompensation on 6/21 noted. -Appreciate Palliative Care input. Now DNR with focus on comfort.  Pulmonary fibrosis: - Pulmonary was consulted.  Per Pulm, there are no options  for treatment for the patient and she is not eligible for a lung transplant.  - Appreciate Palliative Care input per above. Focus on comfort  Sepsis secondary to pneumodediastinum present on admit: -Patient was found to be tachycardic and tachypneic on admission. -WBC elevated at 20.5.  -Blood cultures x 2 show no growth. Focus is more on comfort  Chronic respiratory failure with hypoxia, interstitial lung disease:  -As noted on CT imaging. Focus on comfort  Pancreatitis:  -Pt has a history of alcohol abuse.  -CT demonstrates edema and stranding about the tail of the pancrease consistent with pancreatitis.  -Cont with analgesia and supportive care  Paranoid schizophrenia, depressive disorder:  -Patient had just recently been seen by telemetry psychiatry on 6/10 who recommended outpatient follow-up -Focus on comfort care as per above. Pt seems lethargic this AM, unable to vocalize  History of DVT on anticoagulation:  -Patient with history of right lower extremity DVT and left upper extremity DVT during hospitalization in 06/02/18.  -Patient was continued on Eliquis. Now focus on comfort  Thrombocytosis: -Platelet count 497 on admission.   GERD: Protonixp.o.  DVT prophylaxis: Eliquis Code Status: DNR Family Communication: Pt in room Disposition Plan: Uncertain at this time  Consultants:   Palliative Care  Procedures:     Antimicrobials: Anti-infectives (From admission, onward)   Start     Dose/Rate Route Frequency Ordered Stop   09/18/2018 1500  piperacillin-tazobactam (ZOSYN) IVPB 3.375 g  Status:  Discontinued     3.375 g 12.5 mL/hr over 240 Minutes Intravenous Every 8 hours 09/19/2018 1444 09/25/18 0819   09/10/2018 1445  piperacillin-tazobactam (ZOSYN) IVPB 3.375 g  Status:  Discontinued     3.375 g 100 mL/hr over 30 Minutes Intravenous Every 8 hours 09/08/2018 1438 09/08/2018 1444      Subjective: Reports feeling somewhat  better. Eager to start using PCA this AM  Objective: Vitals:   09/24/18 1333 09/24/18 2126 09/25/18 0800 09/25/18 0903  BP:      Pulse:      Resp: 15 14 14 14   Temp:      TempSrc:      SpO2: 91% 94% 90%   Weight:      Height:        Intake/Output Summary (Last 24 hours) at 09/25/2018 1407 Last data filed at 09/25/2018 0900 Gross per 24 hour  Intake 218.91 ml  Output --  Net 218.91 ml   Filed Weights   09/21/2018 0904  Weight: 49.9 kg    Examination: General exam: Asleep, lethargic, laying in bed, in nad Respiratory system: Increased respiratory effort, no audible wheezing  Data Reviewed: I have personally reviewed following labs and imaging studies  CBC: Recent Labs  Lab 09/19/2018 1014 09/21/18 0711 09/22/18 0310  WBC 20.5* 20.2* 19.5*  NEUTROABS 16.1*  --   --   HGB 11.8* 10.6* 9.8*  HCT 36.9 33.1* 30.3*  MCV 95.8 96.2 95.0  PLT 497* 453* 662*   Basic Metabolic Panel: Recent Labs  Lab 09/03/2018 1014 09/21/18 0711 09/22/18 0310 09/23/18 0803 09/24/18 0932  NA 137 134* 138 134* 136  K 3.7 3.3* 3.8 3.1* 3.1*  CL 100 99 100 85* 83*  CO2 27 27 28  37* 38*  GLUCOSE 103* 114* 92 94 152*  BUN 6 5* <5* <5* 6  CREATININE 0.51 0.63 0.58 0.64 0.75  CALCIUM 8.7* 8.1* 8.4* 8.7* 8.8*   GFR: Estimated Creatinine Clearance: 61.1 mL/min (by C-G formula based on SCr of 0.75 mg/dL). Liver Function  Tests: No results for input(s): AST, ALT, ALKPHOS, BILITOT, PROT, ALBUMIN in the last 168 hours. Recent Labs  Lab 09/22/2018 1611  LIPASE 28   No results for input(s): AMMONIA in the last 168 hours. Coagulation Profile: Recent Labs  Lab 10/01/2018 1611  INR 1.0   Cardiac Enzymes: Recent Labs  Lab 09/21/18 1558 09/21/18 2232 09/22/18 0310  TROPONINI <0.03 <0.03 <0.03   BNP (last 3 results) No results for input(s): PROBNP in the last 8760 hours. HbA1C: No results for input(s): HGBA1C in the last 72 hours. CBG: No results for input(s): GLUCAP in the last 168  hours. Lipid Profile: No results for input(s): CHOL, HDL, LDLCALC, TRIG, CHOLHDL, LDLDIRECT in the last 72 hours. Thyroid Function Tests: No results for input(s): TSH, T4TOTAL, FREET4, T3FREE, THYROIDAB in the last 72 hours. Anemia Panel: No results for input(s): VITAMINB12, FOLATE, FERRITIN, TIBC, IRON, RETICCTPCT in the last 72 hours. Sepsis Labs: Recent Labs  Lab 09/03/2018 1611  LATICACIDVEN 1.0    Recent Results (from the past 240 hour(s))  SARS Coronavirus 2 (CEPHEID - Performed in [facility] hospital lab), Hosp Order     Status: None   Collection Time: 09/16/18 12:56 AM   Specimen: Nasopharyngeal Swab  Result Value Ref Range Status   SARS Coronavirus 2 NEGATIVE NEGATIVE Final    Comment: (NOTE) If result is NEGATIVE SARS-CoV-2 target nucleic acids are NOT DETECTED. The SARS-CoV-2 RNA is generally detectable in upper and lower  respiratory specimens during the acute phase of infection. The lowest  concentration of SARS-CoV-2 viral copies this assay can detect is 250  copies / mL. A negative result does not preclude SARS-CoV-2 infection  and should not be used as the sole basis for treatment or other  patient management decisions.  A negative result may occur with  improper specimen collection / handling, submission of specimen other  than nasopharyngeal swab, presence of viral mutation(s) within the  areas targeted by this assay, and inadequate number of viral copies  (<250 copies / mL). A negative result must be combined with clinical  observations, patient history, and epidemiological information. If result is POSITIVE SARS-CoV-2 target nucleic acids are DETECTED. The SARS-CoV-2 RNA is generally detectable in upper and lower  respiratory specimens dur ing the acute phase of infection.  Positive  results are indicative of active infection with SARS-CoV-2.  Clinical  correlation with patient history and other diagnostic information is  necessary to determine patient  infection status.  Positive results do  not rule out bacterial infection or co-infection with other viruses. If result is PRESUMPTIVE POSTIVE SARS-CoV-2 nucleic acids MAY BE PRESENT.   A presumptive positive result was obtained on the submitted specimen  and confirmed on repeat testing.  While 2019 novel coronavirus  (SARS-CoV-2) nucleic acids may be present in the submitted sample  additional confirmatory testing may be necessary for epidemiological  and / or clinical management purposes  to differentiate between  SARS-CoV-2 and other Sarbecovirus currently known to infect humans.  If clinically indicated additional testing with an alternate test  methodology [phone]) is advised. The SARS-CoV-2 RNA is generally  detectable in upper and lower respiratory sp ecimens during the acute  phase of infection. The expected result is Negative. Fact Sheet for Patients:  StrictlyIdeas.no Fact Sheet for Healthcare Providers: BankingDealers.co.za This test is not yet approved or cleared by the [place] FDA and has been authorized for detection and/or diagnosis of SARS-CoV-2 by FDA under an Emergency Use Authorization (EUA).  This EUA  will remain in effect (meaning this test can be used) for the duration of the COVID-19 declaration under Section 564(b)(1) of the Act, 21 U.S.C. section 360bbb-3(b)(1), unless the authorization is terminated or revoked sooner. Performed at [facility], [address] [place], [place] [address]   Novel Coronavirus,NAA,(SEND-OUT TO REF LAB - TAT 24-48 hrs); Hosp Order     Status: None   Collection Time: 09/16/2018 10:51 AM   Specimen: Nasopharyngeal Swab; Respiratory  Result Value Ref Range Status   SARS-CoV-2, NAA NOT DETECTED NOT DETECTED Final    Comment: (NOTE) This test was developed and its performance characteristics determined by [employer]. This test has not been FDA cleared or approved. This test  has been authorized by FDA under an Emergency Use Authorization (EUA). This test is only authorized for the duration of time the declaration that circumstances exist justifying the authorization of the emergency use of in vitro diagnostic tests for detection of SARS-CoV-2 virus and/or diagnosis of COVID-19 infection under section 564(b)(1) of the Act, 21 U.S.C. 283TDV-7(O)(1), unless the authorization is terminated or revoked sooner. When diagnostic testing is negative, the possibility of a false negative result should be considered in the context of a patient's recent exposures and the presence of clinical signs and symptoms consistent with COVID-19. An individual without symptoms of COVID-19 and who is not shedding SARS-CoV-2 virus would expect to have a negative (not detected) result in this assay. Performed  At: [facility] [address] [place], [place] [id number] Rush Farmer MD IR:[id number]    [place]  Final    Comment: Performed at [facility], [place] [address], [place], [place] [address]  Culture, blood (routine x 2)     Status: None   Collection Time: 09/29/2018  4:18 PM   Specimen: BLOOD RIGHT HAND  Result Value Ref Range Status   Specimen Description BLOOD RIGHT HAND  Final   Special Requests   Final    BOTTLES DRAWN AEROBIC ONLY Blood Culture results may not be optimal due to an inadequate volume of blood received in culture bottles   Culture   Final    NO GROWTH 5 DAYS Performed at [facility], [place] [address], [place], [place] [address]    Report Status 09/25/2018 FINAL  Final  Culture, blood (routine x 2)     Status: None   Collection Time: 09/09/2018  4:23 PM   Specimen: BLOOD LEFT HAND  Result Value Ref Range Status   Specimen Description BLOOD LEFT HAND  Final   Special Requests   Final    BOTTLES DRAWN AEROBIC ONLY Blood Culture results may not be optimal due to an inadequate volume of blood received in culture  bottles   Culture   Final    NO GROWTH 5 DAYS Performed at [facility], [place] [address], [place], [place] [address]    Report Status 09/25/2018 FINAL  Final  Respiratory Panel by PCR     Status: None   Collection Time: 09/21/18 11:11 PM  Result Value Ref Range Status   Adenovirus NOT DETECTED NOT DETECTED Final   Coronavirus 229E NOT DETECTED NOT DETECTED Final    Comment: (NOTE) The Coronavirus on the Respiratory Panel, DOES NOT test for the novel  Coronavirus (2019 nCoV)    Coronavirus HKU1 NOT DETECTED NOT DETECTED Final   Coronavirus NL63 NOT DETECTED NOT DETECTED Final   Coronavirus OC43 NOT DETECTED NOT DETECTED Final   Metapneumovirus NOT DETECTED NOT  DETECTED Final   Rhinovirus / Enterovirus NOT DETECTED NOT DETECTED Final   Influenza A NOT DETECTED NOT DETECTED Final   Influenza B NOT DETECTED NOT DETECTED Final   Parainfluenza Virus 1 NOT DETECTED NOT DETECTED Final   Parainfluenza Virus 2 NOT DETECTED NOT DETECTED Final   Parainfluenza Virus 3 NOT DETECTED NOT DETECTED Final   Parainfluenza Virus 4 NOT DETECTED NOT DETECTED Final   Respiratory Syncytial Virus NOT DETECTED NOT DETECTED Final   Bordetella pertussis NOT DETECTED NOT DETECTED Final   Chlamydophila pneumoniae NOT DETECTED NOT DETECTED Final   Mycoplasma pneumoniae NOT DETECTED NOT DETECTED Final    Comment: Performed at [facility], [place] [address], [place], [place] [address]     Radiology Studies: No results found.  Scheduled Meds:  furosemide  20 mg Intravenous Q12H   lidocaine  1 patch Transdermal Q24H   sodium chloride flush  3 mL Intravenous Q12H   Continuous Infusions:  HYDROmorphone 2 mg/hr (09/25/18 0954)   LORazepam (ATIVAN) infusion 1 mg/hr (09/25/18 0956)     LOS: 5 days   Marylu Lund, MD [facility] Pager On Amion  If 7PM-7AM, please contact night-coverage 09/25/2018, 2:07 PM

## 2018-09-25 NOTE — Consult Note (Signed)
   [facility] CM Inpatient Consult   09/25/2018  Christine Ortega 30-Sep-1960 [id number]    Follow-up note:  Patient being followed for disposition and follow-up call made to Dr. Lesia Hausen office to verify if patient is currently seeing this provider as her primary care physician- as listed on Epic, however, office staff refuses to disclose any information.  Called and spoke to Inpatient transition of care CM and was informed that patient is "anticipating death" due to end stage IPF, now DNR with focus on comfort care.  No current [facility] care management needs identified at this point.   For questions and additional information, please contact:  Romari Gasparro A. Paysen Goza, BSN, RN-BC [facility] Liaison Cell: [phone]

## 2018-09-25 NOTE — Progress Notes (Signed)
   09/25/18 1110  Clinical Encounter Type  Visited With Patient and family together  Visit Type Initial;Spiritual support  Referral From Other (Comment) (Unit Sec)  Consult/Referral To Chaplain  The Unit Sec shared a Comfort Tray is ordered for Pt. family.  The chaplain arrived at the time of the tray and introduced herself to the Pt. daughters-April and Jonelle Sidle and a cousin.  The chaplain heard April words describing her mother's trusting relationship with Jesus. The chaplain assisted with adding ice to the tray and informed the family how to reach a chaplain if needed. This chaplain is available for F/U spiritual care as needed.

## 2018-09-26 MED ORDER — HYDROMORPHONE BOLUS VIA INFUSION
2.0000 mg | INTRAVENOUS | Status: DC | PRN
  Administered 2018-09-26: 2 mg via INTRAVENOUS
  Administered 2018-09-27: 3 mg via INTRAVENOUS
  Administered 2018-09-27 (×3): 4 mg via INTRAVENOUS
  Administered 2018-09-27: 3 mg via INTRAVENOUS
  Filled 2018-09-26: qty 4

## 2018-09-26 MED ORDER — LORAZEPAM BOLUS VIA INFUSION
1.0000 mg | INTRAVENOUS | Status: DC | PRN
  Administered 2018-09-26 – 2018-09-27 (×2): 2 mg via INTRAVENOUS
  Administered 2018-09-27: 4 mg via INTRAVENOUS
  Filled 2018-09-26: qty 4

## 2018-09-26 NOTE — Progress Notes (Signed)
Dilaudid 2mg  bolus given per verbal communication via phone from [place] NP, with charge [employer].  New bags of Ativan & Dilaudid started with [place] RN. Wasted 34ml Dilaudid in Pyxis. Wasted 55ml Ativan with Engineer, mining to stericycle.

## 2018-09-26 NOTE — Progress Notes (Signed)
PROGRESS NOTE  Christine Ortega GBT:[id number] DOB: 08-27-1960 DOA: 09/04/2018 PCP: Lucia Gaskins, MD  HPI/Recap of past 24 hours: Brief Narrative:  58 y.o.femalewith medical history significant ofCOPD oxygen dependent on 3 L, schizophrenia, bipolar disorder, depression, chronic pain, DVT on Eliquis;who presents with complaints of chest pain. Patient had just been hospitalized from 6/5-6/11 for healthcare associated pneumonia. Then returned to the hospital on 6/13for worsening shortness of breath for which CT angiogram revealed pneumomediastinum. However, patient refused to have soluble esophagogram study and ultimately left AGAINST MEDICAL ADVICE on 6/14. Since leaving the hospital patient reports that she has had progressively worsening pain in her chest and back. She continues to shortness of breath and cough that is intermittently productive ofthick white sputum. Denies having any fever, chills, nausea, or vomiting.  Upon admission into the emergency department patient was seen to be afebrile, pulse 83-119, respirations 19-30, blood pressures 127/93-153/103, and O2 saturations 89 to 100% on 3 L of nasal cannula oxygen. Labs revealed WBC 20.5, hemoglobin 11.8, and platelets 497. Chest x-ray showed fibrotic changes in the lungs right greater than left, but no other interval changes. CT angiogram of the chest did reveal interval increase in pneumomediastinum, extensive interstitial fibrosis, and clinical findings consistent with pancreatitis. Patient had been given oxycodone 5-325 mg, morphine 6mg  IV, and Tessalon 100 mg orally. [facility] called to admit. Negative for COVID 4 days ago.  Cardiothoracic Surgery was consulted regarding the pneumomediastinum. Esophagram was obtained that did not demonstrate any extravasation to suggest perforation. It did demonstrate likely presbyesophagus and GERD. They stated that no surgical intervention was necessary.   Pulmonology was consulted the  following morning. The patient has end stage IPF wit severe honeycombing. The pneumomediastinum is due to breaking of blebs. Dr. Chase Caller stated that this was most likely due to autoimmune causes and he ordered some labwork to investigate this.It has not demonstrated likely autoimmune cause.He further stated that she is not eligible for a lung transplant which would be the only medical help for her. He feels that she is hospice appropriate. He cautions that should she deteriorate he would not intubate her as the risk of barotrauma, pneumothorax, and other complications from mechanical ventilation are greater than the benefit to the patient. Palliative care has been consulted. The patient's husband would want the patient to be DNR and hospice care. The patient has stated her desire for full code status. Dr. Chase Caller endorses the use of High Flow O2 by nasal cannula, should the patient's oxygen saturations drop significantly on NRB.  09/26/18: Seen and examined at her bedside.  On nonrebreather.  Family member asleep at bedside.  Agonal breathing noted.  Comfort measures only.  Death is imminent.   Assessment/Plan: Principal Problem:   Pneumomediastinum (HCC) Active Problems:   Bipolar disorder (HCC)   Chronic pain   Schizophrenia, paranoid type ([place])   Thrombocytosis ([place])   ILD (interstitial lung disease) ([place])   Palliative care by specialist   Goals of care, counseling/discussion   Palliative care encounter   Assessment:  Pneumomediastinum, present on admission Pulmonary fibrosis Acute pancreatitis Paranoid schizophrenia History of DVT on anticoagulation Thrombocytosis GERD  Plan: Comfort measures only. Anticipated hospital death. Management per palliative care team.  CODE STATUS: DNR.   Objective: Vitals:   09/25/18 0800 09/25/18 0903 09/25/18 1713 09/26/18 0337  BP:   93/68   Pulse:   100   Resp: 14 14  18   Temp:   98 F (36.7 C)   TempSrc:  Oral   SpO2: 90%   100%   Weight:      Height:        Intake/Output Summary (Last 24 hours) at 09/26/2018 1338 Last data filed at 09/25/2018 1500 Gross per 24 hour  Intake 29.22 ml  Output -  Net 29.22 ml   Filed Weights   09/13/2018 0904  Weight: 49.9 kg    Exam:  . General: 58 y.o. year-old female chronically ill-appearing on nonrebreather.  Agonal breathing noted.  . Cardiovascular: Regular rate and rhythm with no rubs or gallops.  Marland Kitchen Respiratory: Agonal breathing.   . Abdomen: Soft . Musculoskeletal: No lower extremity edema.  Data Reviewed: CBC: Recent Labs  Lab 09/10/2018 1014 09/21/18 0711 09/22/18 0310  WBC 20.5* 20.2* 19.5*  NEUTROABS 16.1*  --   --   HGB 11.8* 10.6* 9.8*  HCT 36.9 33.1* 30.3*  MCV 95.8 96.2 95.0  PLT 497* 453* 096*   Basic Metabolic Panel: Recent Labs  Lab 09/23/2018 1014 09/21/18 0711 09/22/18 0310 09/23/18 0803 09/24/18 0932  NA 137 134* 138 134* 136  K 3.7 3.3* 3.8 3.1* 3.1*  CL 100 99 100 85* 83*  CO2 27 27 28  37* 38*  GLUCOSE 103* 114* 92 94 152*  BUN 6 5* <5* <5* 6  CREATININE 0.51 0.63 0.58 0.64 0.75  CALCIUM 8.7* 8.1* 8.4* 8.7* 8.8*   GFR: Estimated Creatinine Clearance: 61.1 mL/min (by C-G formula based on SCr of 0.75 mg/dL). Liver Function Tests: No results for input(s): AST, ALT, ALKPHOS, BILITOT, PROT, ALBUMIN in the last 168 hours. Recent Labs  Lab 09/09/2018 1611  LIPASE 28   No results for input(s): AMMONIA in the last 168 hours. Coagulation Profile: Recent Labs  Lab 09/09/2018 1611  INR 1.0   Cardiac Enzymes: Recent Labs  Lab 09/21/18 1558 09/21/18 2232 09/22/18 0310  TROPONINI <0.03 <0.03 <0.03   BNP (last 3 results) No results for input(s): PROBNP in the last 8760 hours. HbA1C: No results for input(s): HGBA1C in the last 72 hours. CBG: No results for input(s): GLUCAP in the last 168 hours. Lipid Profile: No results for input(s): CHOL, HDL, LDLCALC, TRIG, CHOLHDL, LDLDIRECT in the last 72 hours. Thyroid Function  Tests: No results for input(s): TSH, T4TOTAL, FREET4, T3FREE, THYROIDAB in the last 72 hours. Anemia Panel: No results for input(s): VITAMINB12, FOLATE, FERRITIN, TIBC, IRON, RETICCTPCT in the last 72 hours. Urine analysis:    Component Value Date/Time   COLORURINE YELLOW 05/19/2018 0921   APPEARANCEUR CLOUDY (A) 05/19/2018 0921   LABSPEC 1.008 05/19/2018 0921   PHURINE 6.0 05/19/2018 0921   GLUCOSEU NEGATIVE 05/19/2018 0921   HGBUR SMALL (A) 05/19/2018 0921   BILIRUBINUR NEGATIVE 05/19/2018 [place] 05/19/2018 0921   PROTEINUR 30 (A) 05/19/2018 0921   NITRITE NEGATIVE 05/19/2018 0921   LEUKOCYTESUR LARGE (A) 05/19/2018 0921   Sepsis Labs: @LABRCNTIP (procalcitonin:4,lacticidven:4)  ) Recent Results (from the past 240 hour(s))  Novel Coronavirus,NAA,(SEND-OUT TO REF LAB - TAT 24-48 hrs); Hosp Order     Status: None   Collection Time: 09/16/2018 10:51 AM   Specimen: Nasopharyngeal Swab; Respiratory  Result Value Ref Range Status   SARS-CoV-2, NAA NOT DETECTED NOT DETECTED Final    Comment: (NOTE) This test was developed and its performance characteristics determined by [employer]. This test has not been FDA cleared or approved. This test has been authorized by FDA under an Emergency Use Authorization (EUA). This test is only authorized for the duration of time the declaration  that circumstances exist justifying the authorization of the emergency use of in vitro diagnostic tests for detection of SARS-CoV-2 virus and/or diagnosis of COVID-19 infection under section 564(b)(1) of the Act, 21 U.S.C. 979GXQ-1(J)(9), unless the authorization is terminated or revoked sooner. When diagnostic testing is negative, the possibility of a false negative result should be considered in the context of a patient's recent exposures and the presence of clinical signs and symptoms consistent with COVID-19. An individual without symptoms of COVID-19 and who is not shedding  SARS-CoV-2 virus would expect to have a negative (not detected) result in this assay. Performed  At: [facility] [address] [place], [place] [id number] Rush Farmer MD YJ:[id number]    [place]  Final    Comment: Performed at [facility], [place] [address] [place], [place] [address]  Culture, blood (routine x 2)     Status: None   Collection Time: 09/16/2018  4:18 PM   Specimen: BLOOD RIGHT HAND  Result Value Ref Range Status   Specimen Description BLOOD RIGHT HAND  Final   Special Requests   Final    BOTTLES DRAWN AEROBIC ONLY Blood Culture results may not be optimal due to an inadequate volume of blood received in culture bottles   Culture   Final    NO GROWTH 5 DAYS Performed at [facility], [place] [address] [place], [place] [address]    Report Status 09/25/2018 FINAL  Final  Culture, blood (routine x 2)     Status: None   Collection Time: 09/26/2018  4:23 PM   Specimen: BLOOD LEFT HAND  Result Value Ref Range Status   Specimen Description BLOOD LEFT HAND  Final   Special Requests   Final    BOTTLES DRAWN AEROBIC ONLY Blood Culture results may not be optimal due to an inadequate volume of blood received in culture bottles   Culture   Final    NO GROWTH 5 DAYS Performed at [facility], [place] [address] [place], [place] [address]    Report Status 09/25/2018 FINAL  Final  Respiratory Panel by PCR     Status: None   Collection Time: 09/21/18 11:11 PM  Result Value Ref Range Status   Adenovirus NOT DETECTED NOT DETECTED Final   Coronavirus 229E NOT DETECTED NOT DETECTED Final    Comment: (NOTE) The Coronavirus on the Respiratory Panel, DOES NOT test for the novel  Coronavirus (2019 nCoV)    Coronavirus HKU1 NOT DETECTED NOT DETECTED Final   Coronavirus NL63 NOT DETECTED NOT DETECTED Final   Coronavirus OC43 NOT DETECTED NOT DETECTED Final   Metapneumovirus NOT DETECTED NOT DETECTED Final   Rhinovirus / Enterovirus NOT  DETECTED NOT DETECTED Final   Influenza A NOT DETECTED NOT DETECTED Final   Influenza B NOT DETECTED NOT DETECTED Final   Parainfluenza Virus 1 NOT DETECTED NOT DETECTED Final   Parainfluenza Virus 2 NOT DETECTED NOT DETECTED Final   Parainfluenza Virus 3 NOT DETECTED NOT DETECTED Final   Parainfluenza Virus 4 NOT DETECTED NOT DETECTED Final   Respiratory Syncytial Virus NOT DETECTED NOT DETECTED Final   Bordetella pertussis NOT DETECTED NOT DETECTED Final   Chlamydophila pneumoniae NOT DETECTED NOT DETECTED Final   Mycoplasma pneumoniae NOT DETECTED NOT DETECTED Final    Comment: Performed at [facility], [place]. [address], [place],  [address]      Studies: No results found.  Scheduled Meds: . furosemide  20 mg Intravenous Q12H  .  lidocaine  1 patch Transdermal Q24H  . sodium chloride flush  3 mL Intravenous Q12H    Continuous Infusions: . HYDROmorphone 2 mg/hr (09/26/18 0422)  . LORazepam (ATIVAN) infusion 1 mg/hr (09/26/18 0423)     LOS: 6 days     Kayleen Memos, MD [facility] Pager [phone]  If 7PM-7AM, please contact night-coverage [email or web address] Password [facility] 09/26/2018, 1:38 PM

## 2018-09-26 NOTE — Progress Notes (Signed)
Pt nonverbal, non eye opening this shift. Breathing became irregular with short periods of apnea. Received Robinul x1 prn, Dilaudid bolus prn x5, ativan bolus prn x2 this shift.

## 2018-09-26 NOTE — Progress Notes (Signed)
Palliative:  HPI: 58 yo female with PMH COPD, bipolar disorder, schizophrenia, seizures, anxiety, depression, DVT on Eliquis admitted on 09/03/2018 with chest pain r/t acute/end stage pulmonary fibrosis (not eligible for lung transplant per notes) with sepsis pneumomediastinum (though to be r/t ruptured blebs with pulmonary fibrosis). Prognosis grim.  I met today at Christine Ortega' bedside with daughter, April. April reflects on the struggles of their relationship s/t to her mother's drug abuse but she received an apology just a few weeks ago for what she has put her family through over the years. April says that even with the drug abuse she has always been very close with her mother. She has fought and advocated for her. April tells me that she is struggling to give her mother permission to die and feels this is why she is still here. I spent some time listening and offering emotional support.   I also mentioned to April consideration of removing NRB as this is likely prolonging her suffering. April understands but needs to think about this. She wants to be here when her mother dies if at all possible. She has a meeting to discuss funeral arrangements this afternoon. April reports that her mother seems to have distress ~3 am and requires multiple prn meds to get symptoms controlled. April reports this is also when her mother historically gets distressed at home s/t schizophrenia. PRN doses will be further liberalized to ensure comfort overnight. All questions/concerns addressed. Emotional support provided.   Exam: Minimally responsive. Able to see some expression with raised eyebrows and hand squeezes at times but inconsistent. Breathing is unlabored and at times irregular. No distress noted.   Plan: - Full comfort care, anticipate hospital death.  - Dyspnea/pain: Dilaudid infusion 2 mg/hr with 2-4 mg IV bolus every 15 min prn.  - Anxiety/agitation: Ativan infusion 1 mg/hr with IV 1-4 mg bolus every 30 min  prn.  - Robinul prn secretions/gurgling.    [place], NP Palliative Medicine Team Pager [phone] (Please see [email or web address] for schedule) Team Phone [phone]    Greater than 50%  of this time was spent counseling and coordinating care related to the above assessment and plan

## 2018-09-27 MED ORDER — GLYCOPYRROLATE 0.2 MG/ML IJ SOLN
0.2000 mg | Freq: Three times a day (TID) | INTRAMUSCULAR | Status: DC
  Administered 2018-09-27 (×2): 0.2 mg via INTRAVENOUS
  Filled 2018-09-27 (×2): qty 1

## 2018-10-03 NOTE — Progress Notes (Signed)
Palliative:  HPI:58 yo female with PMH COPD, bipolar disorder, schizophrenia, seizures, anxiety, depression, DVT on Eliquis admitted on 09/30/2018 with chest pain r/t acute/end stage pulmonary fibrosis (not eligible for lung transplant per notes) with sepsis pneumomediastinum (though to be r/t ruptured blebs with pulmonary fibrosis). Prognosis grim and hospital death expected.   I spent some time at Ms. Piercey' bedside. Offered emotional support and listening to daughter, April. April tells me that her mother will still at times squeeze her hand and she felt that her mother indicated to her that she did not want to remove NRB mask.   I followed up in the evening and April tells me that she did finally tell her mother that it is okay to go and that she will be fine. April shares some memories she has had with her mother. April is scared to leave as she wants to ensure that she is present with her mother when she dies. She feels that her mother is likely to die overnight tonight. We discussed that this is very possible but could also be another couple days. She has progressed today with facial muscles drawn back, increased apnea, and dusky lips. She does appear comfortable. Emotional support provided to April.   Exam: More pale, dusky skin. Muscles drawn back. Apnea present.   Plan: - Full comfort care, anticipate hospital death.  - Dyspnea/pain: Dilaudid infusion 2 mg/hr with 2-4 mg IV bolus every 15 min prn.  - Anxiety/agitation: Ativan infusion 1 mg/hr with IV 1-4 mg bolus every 30 min prn.  - Robinul scheduled and prn.    [place], NP Palliative Medicine Team Pager [phone] (Please see [email or web address] for schedule) Team Phone [phone]    Greater than 50%  of this time was spent counseling and coordinating care related to the above assessment and plan

## 2018-10-03 NOTE — Care Management Important Message (Signed)
Important Message  Patient Details  Name: Christine Ortega MRN: [id number] Date of Birth: 07/20/1960   Medicare Important Message Given:  Yes     [name] October 20, 2018, 1:35 PM

## 2018-10-03 NOTE — Discharge Summary (Signed)
Death Summary  Christine Ortega PPJ:[id number] DOB: 1960-11-16 DOA: 10/03/18  PCP: Lucia Gaskins, MD  Admit date: Oct 03, 2018 Date of Death: 2018/10/15 Time of Death: 07/09/1908 Notification: Lucia Gaskins, MD notified of death of 15-Oct-2018   History of present illness: 58 y.o.femalewith medical history significant ofCOPD oxygen dependent on 3 L, schizophrenia, bipolar disorder, depression, chronic pain, DVT on Eliquis;who presents with complaints of chest pain. Patient had just been hospitalized from 6/5-6/11/20 for healthcare associated pneumonia. Then returned to the hospital on 6/13/20for worsening shortness of breath for which CT angiogram revealed pneumomediastinum. However, patient refused to have soluble esophagogram study and ultimately left AGAINST MEDICAL ADVICE on 09/16/18. Since leaving the hospital patient reports that she has had progressively worsening pain in her chest and back. She continued to have shortness of breath and cough intermittently productive ofthick white sputum.   Upon admission into the emergency department, Labs revealed WBC 20.5, hemoglobin 11.8, and platelets 497. Chest x-ray showed fibrotic changes in the lungs right greater than left, but no other interval changes. CT angiogram of the chest did reveal interval increase in pneumomediastinum, extensive interstitial fibrosis, and clinical findings consistent with pancreatitis. [facility] called to admit. Negative for COVID 4 days ago.  Cardiothoracic Surgery consulted regarding the pneumomediastinum. Esophagram was obtained that did not demonstrate any extravasation to suggest perforation. It did demonstrate likely presbyesophagus and GERD. No surgical intervention was necessary.   Pulmonology consulted. The patient has end stage IPF with severe honeycombing. The pneumomediastinum is possibly due to breaking of blebs. Autoimmune cause ruled out.Poor prognosis.Palliative care followed. The patient's family  made decision for DNR and hospice care.   10/10/18: Patient was seen and examined with her daughter at her bedside. No new concerns per her daughter. Agonal breathing noted. Comfort measures in place. Death is imminent.   Final Diagnoses:  Cardiopulmonary arrest Severe interstitial pulmonary fibrosis, likely end stage. Pneumomediastinum. Bipolar disorder. Paranoid schizophrenia. Seizure disorder. History of DVT on anticoagulation. Thrombocytosis. GERD  The patient was comfort care as requested by her family.  The patient expired at 1908/07/09 with her daughter present at her bedside.     The results of significant diagnostics from this hospitalization (including imaging, microbiology, ancillary and laboratory) are listed below for reference.    Significant Diagnostic Studies: Dg Chest 1 View  Result Date: 09/12/2018 CLINICAL DATA:  Chest wall pain EXAM: CHEST  1 VIEW COMPARISON:  09/11/2018 chest radiograph. FINDINGS: Left rotated chest radiograph stable cardiomediastinal silhouette with normal heart size. No pneumothorax. No pleural effusion. Extensive patchy reticular opacity throughout both lungs, right greater than left, not substantially changed accounting for differences in patient position. IMPRESSION: No appreciable change in extensive patchy reticular opacities throughout the right greater than left lungs, compatible with advanced fibrotic interstitial lung disease as seen on 07/27/2018 chest CT. Electronically Signed   By: Ilona Sorrel M.D.   On: 09/12/2018 14:18   Ct Angio Chest Pe W And/or Wo Contrast  Result Date: 10-03-2018 CLINICAL DATA:  Reported pneumonia diagnosed 1 week ago. Patient complaining of chest and chest wall pain. EXAM: CT ANGIOGRAPHY CHEST WITH CONTRAST TECHNIQUE: Multidetector CT imaging of the chest was performed using the standard protocol during bolus administration of intravenous contrast. Multiplanar CT image reconstructions and MIPs were obtained to  evaluate the vascular anatomy. CONTRAST:  30mL OMNIPAQUE IOHEXOL 350 MG/ML SOLN COMPARISON:  CTA chest, 09/15/2018. FINDINGS: Cardiovascular: There is satisfactory opacification of the pulmonary arteries to the segmental level. There is no evidence of a pulmonary embolism.  Heart is normal in size. No pericardial effusion. No coronary artery calcifications. Great vessels are normal caliber. No aortic dissection. Mild aortic atherosclerosis. Mediastinum/Nodes: There is pneumomediastinum. This lies adjacent to the esophagus, but is also seen in the middle and anterior mediastinum and in the supraclavicular regions and neck base. Overall, there has been an increase in mediastinal air since the prior CTA. The neck base subcutaneous emphysema is new. Esophagus is mild-to-moderately distended as was on the prior CT. No mediastinal or masses. There scattered prominent lymph nodes along the right paratracheal change, subcarinal region and along both hila, with no discrete, pathologically enlarged no. Subcarinal nodes are contiguous with the distended esophagus. Lungs/Pleura: Lungs show advanced fibrosis with large areas of honeycombing, unchanged from prior CTA. No convincing pneumonia or pulmonary edema. No pleural effusion and no pneumothorax. Upper Abdomen: Irregular fluid attenuation lies adjacent to the pancreatic tail increased in extent compared to the most recent prior chest CT, and new when compared to the CT dated 07/27/2018. Musculoskeletal: No fracture or acute finding. No osteoblastic or osteolytic lesions. Review of the MIP images confirms the above findings. IMPRESSION: 1. No evidence of a pulmonary embolism. 2. Interval increase in pneumomediastinum, now with air extending to neck base and supraclavicular regions. This may be due to barotrauma in the setting of severe lung fibrosis. However, the esophagus is distended with possible wall thickening. And esophageal source of this air should be considered. 3.  Low attenuation adjacent to the pancreas. This may reflect fluid/inflammation if there are clinical findings consistent with pancreatitis. This appears mildly increased from the most recent prior CT and new from the study dated 07/27/2018. 4. Extensive interstitial fibrosis. Appearance of the lungs is stable from the prior exams. 5. Mild aortic atherosclerosis. Aortic Atherosclerosis (ICD10-I70.0). Electronically Signed   By: Lajean Manes M.D.   On: 09/29/2018 12:06   Ct Angio Chest Pe W And/or Wo Contrast  Result Date: 09/15/2018 CLINICAL DATA:  Pain and shortness of breath. EXAM: CT ANGIOGRAPHY CHEST WITH CONTRAST TECHNIQUE: Multidetector CT imaging of the chest was performed using the standard protocol during bolus administration of intravenous contrast. Multiplanar CT image reconstructions and MIPs were obtained to evaluate the vascular anatomy. CONTRAST:  66mL OMNIPAQUE IOHEXOL 350 MG/ML SOLN COMPARISON:  CT dated 07/27/2018 FINDINGS: Cardiovascular: Examination is severely limited by motion artifact. Heart size is enlarged. There is no large centrally located pulmonary embolus. Detection of smaller segmental and subsegmental pulmonary emboli is severely limited by respiratory motion artifact. Atherosclerotic changes are again noted of the thoracic aorta. Mediastinum/Nodes: There is an enlarged subcarinal lymph node measuring approximately 1.4 cm. There are additional prominent mediastinal and hilar lymph nodes. No significant axillary adenopathy. No significant supraclavicular adenopathy. Lungs/Pleura: Again noted is extensive pulmonary fibrosis bilaterally. There is no focal area of consolidation. There appears to be a few small areas of new pneumomediastinum, most notably about the lower esophagus and adjacent to the proximal left mainstem bronchus. Upper Abdomen: The patient appears to be status post prior coil embolization of the GDA. There is likely cholelithiasis. There is some mild diffuse wall  thickening of the esophagus. There is an apparent new low-attenuation collection adjacent to the pancreas that appears to be centered within the lesser sac. Musculoskeletal: Evaluation for fractures is severely limited by motion artifact. There are multiple suspicious areas involving the anterior left ribs and sternum which are favored to be secondary to motion artifact. Review of the MIP images confirms the above findings. IMPRESSION: 1. Evaluation is severely  limited by motion artifact. 2. Given the limitations described above, no PE was identified on this exam. 3. Apparent new small volume pneumomediastinum. This may represent spontaneous pneumomediastinum in the setting of severe pulmonary fibrosis. Other differential considerations include traumatic causes of says and esophageal tear or tracheobronchial injury. 4. Diffuse wall thickening of the esophagus, similar to prior study. This may represent underlying esophagitis. 5. New low-attenuation structure anterior to the pancreatic body, likely centered within the lesser sac. This was not well appreciated on CT dated 07/27/2018. This could represent a small amount of free fluid or a pancreatic pseudocyst. Correlation with lipase is recommended. This can be followed up with a nonemergent outpatient CT of the abdomen. 6. Persistent but stable prominent mediastinal lymph nodes, likely reactive. 7. No pneumothorax.  Aortic Atherosclerosis (ICD10-I70.0). Electronically Signed   By: Constance Holster M.D.   On: 09/15/2018 23:46   Dg Chest Port 1 View  Result Date: 09/21/2018 CLINICAL DATA:  Initial evaluation for acute chest pain. History of COPD. EXAM: PORTABLE CHEST 1 VIEW COMPARISON:  Prior radiograph and CT from 09/10/2018. FINDINGS: Patient is rotated to the right. Cardiac and mediastinal silhouettes are stable in size and contour, and remain within normal limits. Overall lung volumes are restricted. Extensive interstitial fibrotic lung changes again seen  involving the right greater than left lungs, little interval change from previous. Slightly increased interstitial prominence as compared to previous could reflect a degree of superimposed interstitial congestion/edema. No frank alveolar edema. No pleural effusion. No consolidative opacity or pneumothorax. Osseous structures unchanged. IMPRESSION: 1. Extensive interstitial fibrotic lung disease with slightly increased interstitial prominence as compared to previous, which could reflect a degree of superimposed interstitial congestion/edema. 2. No other new active cardiopulmonary disease. Electronically Signed   By: Jeannine Boga M.D.   On: 09/21/2018 16:43   Dg Chest Port 1 View  Result Date: 09/18/2018 CLINICAL DATA:  Shortness of breath. EXAM: PORTABLE CHEST 1 VIEW COMPARISON:  September 15, 2018 FINDINGS: No pneumothorax. Fibrotic changes in the lungs are stable. The cardiomediastinal silhouette is stable. No other abnormalities. IMPRESSION: Fibrotic changes in the lungs, right greater than left. No other interval changes or acute abnormalities. Electronically Signed   By: Dorise Bullion III M.D   On: 10/01/2018 10:04   Dg Chest Port 1 View  Result Date: 09/15/2018 CLINICAL DATA:  Shortness of breath EXAM: PORTABLE CHEST 1 VIEW COMPARISON:  09/12/2018 FINDINGS: Again identified are coarsened reticular airspace opacities throughout both lung fields. This appearance is similar to multiple prior studies. There is no new focal area of consolidation. The heart size is unchanged. There is no acute osseous abnormality. IMPRESSION: Stable appearance of the chest with no new focal abnormality. Electronically Signed   By: Constance Holster M.D.   On: 09/15/2018 21:24   Dg Chest Port 1 View  Result Date: 09/11/2018 CLINICAL DATA:  Cough and shortness of breath EXAM: PORTABLE CHEST 1 VIEW COMPARISON:  09/10/2018 FINDINGS: Cardiac shadow is mildly enlarged but stable in appearance. Diffuse bilateral airspace  opacities stable from the previous exam. No new superimposed abnormality is noted. IMPRESSION: Stable airspace opacities bilaterally. No new focal abnormality is noted. Electronically Signed   By: Inez Catalina M.D.   On: 09/11/2018 08:40   Dg Chest Port 1 View  Result Date: 09/10/2018 CLINICAL DATA:  Cough and congestion EXAM: PORTABLE CHEST 1 VIEW COMPARISON:  09/09/2018 FINDINGS: Cardiac shadow is stable. Diffuse bilateral airspace opacities are again identified stable. No new focal infiltrate is seen. No  bony abnormality is noted. IMPRESSION: Bilateral airspace opacities stable from the previous exam. Electronically Signed   By: Inez Catalina M.D.   On: 09/10/2018 15:45   Dg Chest Port 1 View  Result Date: 09/09/2018 CLINICAL DATA:  Shortness of breath EXAM: PORTABLE CHEST 1 VIEW COMPARISON:  Chest radiograph 08/08/2018 FINDINGS: Monitoring leads overlie the patient. Stable cardiomegaly. Slight improved aeration mild improvement in diffuse bilateral airspace opacities. No pleural effusion or pneumothorax. IMPRESSION: Mild interval improvement diffuse bilateral airspace opacities. Findings may be secondary to infection or edema chronic background of fibrotic changes. Electronically Signed   By: Lovey Newcomer M.D.   On: 09/09/2018 09:11   Dg Chest Port 1 View  Result Date: 09/08/2018 CLINICAL DATA:  Pneumonia twice this year with current difficulty breathing with cough. EXAM: PORTABLE CHEST 1 VIEW COMPARISON:  09/07/2018, 05/19/2018 and chest CT 05/23/2018 FINDINGS: Lungs are somewhat hypoinflated demonstrate a background of diffuse interstitial disease compatible with known fibrosis. There is mild persistent hazy bilateral central lung opacification which may be due to superimposed edema versus infection. No effusion. Cardiomediastinal silhouette and remainder of the exam is unchanged. IMPRESSION: Persistent hazy central lung opacification which may be due to mild interstitial edema versus acute infection on  background of chronic fibrosis. Electronically Signed   By: Marin Olp M.D.   On: 09/08/2018 13:28   Dg Chest Port 1 View  Result Date: 09/07/2018 CLINICAL DATA:  Cough with shortness of breath. EXAM: PORTABLE CHEST 1 VIEW COMPARISON:  08/01/2018. FINDINGS: The heart is enlarged. On a background of chronic interstitial lung disease, there is asymmetric increased opacity in the RIGHT mid and lower lung zones which could represent infection or edema. No effusion or pneumothorax. Bones unremarkable. IMPRESSION: Cardiomegaly. Asymmetric increased opacity in the RIGHT mid and lower lung zones which could represent infection or edema. Worsening aeration. Chronic interstitial lung disease. Correlate clinically for pulmonary edema versus early RIGHT lung infiltrate. Electronically Signed   By: Staci Righter M.D.   On: 09/07/2018 17:14   Dg Esophagus W Single Cm (sol Or Thin Ba)  Result Date: 09/25/2018 CLINICAL DATA:  Pneumomediastinum on CT. EXAM: ESOPHOGRAM/BARIUM SWALLOW TECHNIQUE: Single contrast examination was performed using 100 cc of Omnipaque 300 FLUOROSCOPY TIME:  Fluoroscopy Time:  1 minutes 42 seconds Radiation Exposure Index (if provided by the fluoroscopic device): Not applicable. Number of Acquired Spot Images: 0 COMPARISON:  Today's chest CT. FINDINGS: Single-contrast, focused exam performed with patient in LPO and RPO position. Limitations secondary to patient clinical status, immobility, shortness of breath. Full column evaluation esophagus demonstrates no persistent narrowing or stricture. No contrast extravasation. There are tertiary contractions within the lower esophagus, suggesting dysmotility. Spontaneous gastroesophageal reflux identified on series 2. IMPRESSION: 1. Moderate limitations, as detailed above. 2. No evidence of contrast extravasation to suggest perforation. 3. Probable presbyesophagus and spontaneous gastroesophageal reflux. Electronically Signed   By: Abigail Miyamoto M.D.   On:  09/14/2018 15:40    Microbiology: No results found for this or any previous visit (from the past 240 hour(s)).   Labs: Basic Metabolic Panel: No results for input(s): NA, K, CL, CO2, GLUCOSE, BUN, CREATININE, CALCIUM, MG, PHOS in the last 168 hours. Liver Function Tests: No results for input(s): AST, ALT, ALKPHOS, BILITOT, PROT, ALBUMIN in the last 168 hours. No results for input(s): LIPASE, AMYLASE in the last 168 hours. No results for input(s): AMMONIA in the last 168 hours. CBC: No results for input(s): WBC, NEUTROABS, HGB, HCT, MCV, PLT in the last 168  hours. Cardiac Enzymes: No results for input(s): CKTOTAL, CKMB, CKMBINDEX, TROPONINI in the last 168 hours. D-Dimer No results for input(s): DDIMER in the last 72 hours. BNP: Invalid input(s): POCBNP CBG: No results for input(s): GLUCAP in the last 168 hours. Anemia work up No results for input(s): VITAMINB12, FOLATE, FERRITIN, TIBC, IRON, RETICCTPCT in the last 72 hours. Urinalysis    Component Value Date/Time   COLORURINE YELLOW 05/19/2018 0921   APPEARANCEUR CLOUDY (A) 05/19/2018 0921   LABSPEC 1.008 05/19/2018 0921   PHURINE 6.0 05/19/2018 0921   GLUCOSEU NEGATIVE 05/19/2018 0921   HGBUR SMALL (A) 05/19/2018 0921   BILIRUBINUR NEGATIVE 05/19/2018 0921   KETONESUR NEGATIVE 05/19/2018 0921   PROTEINUR 30 (A) 05/19/2018 0921   NITRITE NEGATIVE 05/19/2018 0921   LEUKOCYTESUR LARGE (A) 05/19/2018 0921   Sepsis Labs Invalid input(s): PROCALCITONIN,  WBC,  LACTICIDVEN     SIGNED:  Kayleen Memos, MD  [facility] 10/02/2018, 5:12 PM Pager   If 7PM-7AM, please contact night-coverage [email or web address] Password [email or web address]

## 2018-10-03 NOTE — Progress Notes (Signed)
PROGRESS NOTE  Christine Ortega WJX:[id number] DOB: 12-11-1960 DOA: 09/13/2018 PCP: Lucia Gaskins, MD  HPI/Recap of past 24 hours: Brief Narrative:  58 y.o.femalewith medical history significant ofCOPD oxygen dependent on 3 L, schizophrenia, bipolar disorder, depression, chronic pain, DVT on Eliquis;who presents with complaints of chest pain. Patient had just been hospitalized from 6/5-6/11 for healthcare associated pneumonia. Then returned to the hospital on 6/13for worsening shortness of breath for which CT angiogram revealed pneumomediastinum. However, patient refused to have soluble esophagogram study and ultimately left AGAINST MEDICAL ADVICE on 6/14. Since leaving the hospital patient reports that she has had progressively worsening pain in her chest and back. She continues to shortness of breath and cough that is intermittently productive ofthick white sputum. Denies having any fever, chills, nausea, or vomiting.  Upon admission into the emergency department patient was seen to be afebrile, pulse 83-119, respirations 19-30, blood pressures 127/93-153/103, and O2 saturations 89 to 100% on 3 L of nasal cannula oxygen. Labs revealed WBC 20.5, hemoglobin 11.8, and platelets 497. Chest x-ray showed fibrotic changes in the lungs right greater than left, but no other interval changes. CT angiogram of the chest did reveal interval increase in pneumomediastinum, extensive interstitial fibrosis, and clinical findings consistent with pancreatitis. Patient had been given oxycodone 5-325 mg, morphine 6mg  IV, and Tessalon 100 mg orally. [facility] called to admit. Negative for COVID 4 days ago.  Cardiothoracic Surgery was consulted regarding the pneumomediastinum. Esophagram was obtained that did not demonstrate any extravasation to suggest perforation. It did demonstrate likely presbyesophagus and GERD. They stated that no surgical intervention was necessary.   Pulmonology was consulted the  following morning. The patient has end stage IPF wit severe honeycombing. The pneumomediastinum is due to breaking of blebs. Dr. Chase Caller stated that this was most likely due to autoimmune causes and he ordered some labwork to investigate this.It has not demonstrated likely autoimmune cause.He further stated that she is not eligible for a lung transplant which would be the only medical help for her. He feels that she is hospice appropriate. He cautions that should she deteriorate he would not intubate her as the risk of barotrauma, pneumothorax, and other complications from mechanical ventilation are greater than the benefit to the patient. Palliative care has been consulted. The patient's husband would want the patient to be DNR and hospice care. The patient has stated her desire for full code status. Dr. Chase Caller endorses the use of High Flow O2 by nasal cannula, should the patient's oxygen saturations drop significantly on NRB.  10-22-2018: Seen and examined with her daughter at bedside. No new concerns per her daughter. Agonal breathing persists. Comfort measures in place. Death is imminent.   Assessment/Plan: Principal Problem:   Pneumomediastinum (HCC) Active Problems:   Bipolar disorder (HCC)   Chronic pain   Schizophrenia, paranoid type ([place])   Thrombocytosis ([place])   ILD (interstitial lung disease) ([place])   Palliative care by specialist   Goals of care, counseling/discussion   Palliative care encounter   Assessment:  Pneumomediastinum, present on admission Pulmonary fibrosis Acute pancreatitis Paranoid schizophrenia History of DVT on anticoagulation Thrombocytosis GERD  Plan: Comfort measures only. Anticipated hospital death. Management per palliative care team.  CODE STATUS: DNR.   Objective: Vitals:   09/25/18 0903 09/25/18 1713 09/26/18 0337 10-22-2018 0000  BP:  93/68    Pulse:  100    Resp: 14  18 12   Temp:  98 F (36.7 C)    TempSrc:  Oral  SpO2:  100%     Weight:      Height:       No intake or output data in the 24 hours ending 10/24/2018 1231 Filed Weights   09/07/2018 0904  Weight: 49.9 kg    Exam:  . General: 58 y.o. year-old female Chronically ill appearing, Agonal breathing. . Cardiovascular: RRR no rubs or gallops.  Marland Kitchen Respiratory: Agonal breathing on NRB.   Marland Kitchen Abdomen: Soft ND  . Musculoskeletal: No LE edema bilaterally.  Data Reviewed: CBC: Recent Labs  Lab 09/21/18 0711 09/22/18 0310  WBC 20.2* 19.5*  HGB 10.6* 9.8*  HCT 33.1* 30.3*  MCV 96.2 95.0  PLT 453* 725*   Basic Metabolic Panel: Recent Labs  Lab 09/21/18 0711 09/22/18 0310 09/23/18 0803 09/24/18 0932  NA 134* 138 134* 136  K 3.3* 3.8 3.1* 3.1*  CL 99 100 85* 83*  CO2 27 28 37* 38*  GLUCOSE 114* 92 94 152*  BUN 5* <5* <5* 6  CREATININE 0.63 0.58 0.64 0.75  CALCIUM 8.1* 8.4* 8.7* 8.8*   GFR: Estimated Creatinine Clearance: 61.1 mL/min (by C-G formula based on SCr of 0.75 mg/dL). Liver Function Tests: No results for input(s): AST, ALT, ALKPHOS, BILITOT, PROT, ALBUMIN in the last 168 hours. Recent Labs  Lab 09/26/2018 1611  LIPASE 28   No results for input(s): AMMONIA in the last 168 hours. Coagulation Profile: Recent Labs  Lab 09/03/2018 1611  INR 1.0   Cardiac Enzymes: Recent Labs  Lab 09/21/18 1558 09/21/18 2232 09/22/18 0310  TROPONINI <0.03 <0.03 <0.03   BNP (last 3 results) No results for input(s): PROBNP in the last 8760 hours. HbA1C: No results for input(s): HGBA1C in the last 72 hours. CBG: No results for input(s): GLUCAP in the last 168 hours. Lipid Profile: No results for input(s): CHOL, HDL, LDLCALC, TRIG, CHOLHDL, LDLDIRECT in the last 72 hours. Thyroid Function Tests: No results for input(s): TSH, T4TOTAL, FREET4, T3FREE, THYROIDAB in the last 72 hours. Anemia Panel: No results for input(s): VITAMINB12, FOLATE, FERRITIN, TIBC, IRON, RETICCTPCT in the last 72 hours. Urine analysis:    Component Value Date/Time    COLORURINE YELLOW 05/19/2018 0921   APPEARANCEUR CLOUDY (A) 05/19/2018 0921   LABSPEC 1.008 05/19/2018 0921   PHURINE 6.0 05/19/2018 0921   GLUCOSEU NEGATIVE 05/19/2018 0921   HGBUR SMALL (A) 05/19/2018 0921   BILIRUBINUR NEGATIVE 05/19/2018 [place] 05/19/2018 0921   PROTEINUR 30 (A) 05/19/2018 0921   NITRITE NEGATIVE 05/19/2018 0921   LEUKOCYTESUR LARGE (A) 05/19/2018 0921   Sepsis Labs: @LABRCNTIP (procalcitonin:4,lacticidven:4)  ) Recent Results (from the past 240 hour(s))  Novel Coronavirus,NAA,(SEND-OUT TO REF LAB - TAT 24-48 hrs); Hosp Order     Status: None   Collection Time: 09/15/2018 10:51 AM   Specimen: Nasopharyngeal Swab; Respiratory  Result Value Ref Range Status   SARS-CoV-2, NAA NOT DETECTED NOT DETECTED Final    Comment: (NOTE) This test was developed and its performance characteristics determined by [employer]. This test has not been FDA cleared or approved. This test has been authorized by FDA under an Emergency Use Authorization (EUA). This test is only authorized for the duration of time the declaration that circumstances exist justifying the authorization of the emergency use of in vitro diagnostic tests for detection of SARS-CoV-2 virus and/or diagnosis of COVID-19 infection under section 564(b)(1) of the Act, 21 U.S.C. 366YQI-3(K)(7), unless the authorization is terminated or revoked sooner. When diagnostic testing is negative, the possibility of a false negative  result should be considered in the context of a patient's recent exposures and the presence of clinical signs and symptoms consistent with COVID-19. An individual without symptoms of COVID-19 and who is not shedding SARS-CoV-2 virus would expect to have a negative (not detected) result in this assay. Performed  At: [facility] [address] [place], [place] [id number] Rush Farmer MD BD:[id number]    [place]  Final    Comment:  Performed at [facility], [place] [address], [place], [place] [address]  Culture, blood (routine x 2)     Status: None   Collection Time: 09/30/2018  4:18 PM   Specimen: BLOOD RIGHT HAND  Result Value Ref Range Status   Specimen Description BLOOD RIGHT HAND  Final   Special Requests   Final    BOTTLES DRAWN AEROBIC ONLY Blood Culture results may not be optimal due to an inadequate volume of blood received in culture bottles   Culture   Final    NO GROWTH 5 DAYS Performed at [facility], [place] [address], [place], [place] [address]    Report Status 09/25/2018 FINAL  Final  Culture, blood (routine x 2)     Status: None   Collection Time: 09/14/2018  4:23 PM   Specimen: BLOOD LEFT HAND  Result Value Ref Range Status   Specimen Description BLOOD LEFT HAND  Final   Special Requests   Final    BOTTLES DRAWN AEROBIC ONLY Blood Culture results may not be optimal due to an inadequate volume of blood received in culture bottles   Culture   Final    NO GROWTH 5 DAYS Performed at [facility], [place] [address] [place], [place] [address]    Report Status 09/25/2018 FINAL  Final  Respiratory Panel by PCR     Status: None   Collection Time: 09/21/18 11:11 PM  Result Value Ref Range Status   Adenovirus NOT DETECTED NOT DETECTED Final   Coronavirus 229E NOT DETECTED NOT DETECTED Final    Comment: (NOTE) The Coronavirus on the Respiratory Panel, DOES NOT test for the novel  Coronavirus (2019 nCoV)    Coronavirus HKU1 NOT DETECTED NOT DETECTED Final   Coronavirus NL63 NOT DETECTED NOT DETECTED Final   Coronavirus OC43 NOT DETECTED NOT DETECTED Final   Metapneumovirus NOT DETECTED NOT DETECTED Final   Rhinovirus / Enterovirus NOT DETECTED NOT DETECTED Final   Influenza A NOT DETECTED NOT DETECTED Final   Influenza B NOT DETECTED NOT DETECTED Final   Parainfluenza Virus 1 NOT DETECTED NOT DETECTED Final   Parainfluenza Virus 2 NOT DETECTED NOT DETECTED Final   Parainfluenza Virus 3  NOT DETECTED NOT DETECTED Final   Parainfluenza Virus 4 NOT DETECTED NOT DETECTED Final   Respiratory Syncytial Virus NOT DETECTED NOT DETECTED Final   Bordetella pertussis NOT DETECTED NOT DETECTED Final   Chlamydophila pneumoniae NOT DETECTED NOT DETECTED Final   Mycoplasma pneumoniae NOT DETECTED NOT DETECTED Final    Comment: Performed at [facility], [place]. [address] [place], [place] [address]      Studies: No results found.  Scheduled Meds: . glycopyrrolate  0.2 mg Intravenous TID  . sodium chloride flush  3 mL Intravenous Q12H    Continuous Infusions: . HYDROmorphone 2 mg/hr (09/26/18 2225)  . LORazepam (ATIVAN) infusion 1 mg/hr (09/26/18 1612)     LOS: 7 days     Kayleen Memos, MD [facility] Pager [phone]  If 7PM-7AM, please contact  night-coverage [email or web address] Password [facility] 10/17/2018, 12:31 PM

## 2018-10-03 NOTE — Progress Notes (Signed)
Agonal breathing; daughter at bedside. During this shift pt was able to respond to simple questions with hand squeezes, facial expression during a short period of time. Received 3x dilaudid bolus', 1x ativan.

## 2018-10-03 NOTE — Progress Notes (Addendum)
The patient passed away. We have lorazepam 50mg  drip and hydromorphone 50mg  drip that we need to waste in the hazardous bin in the pharmacy.Marland Kitchen

## 2018-10-03 NOTE — Progress Notes (Signed)
Patient passed away at approximately 1908/07/10 with daughter present.  Death was pronounced by Staci Righter, RN and this RN.  Patient had a dilaudid and ativan drip at the time of death.  There was approximately 100 ml of Dilaudid and 25 ml of Ativan remaining.  Both medications were wasted and disposed of in the toilet.  The waste was witnessed by Staci Righter, RN.  [facility] was notified.  Their reference number is [id number].  The patient was not a candidate for organ donation.

## 2018-10-03 DEATH — deceased

## 2020-04-04 IMAGING — DX DG ABD PORTABLE 1V
1 series · 1 of 1 positions shown · non-contrast
Comparison: None.

CLINICAL DATA: Bowel perforation.

EXAM:
PORTABLE ABDOMEN - 1 VIEW

[abdomen kub]
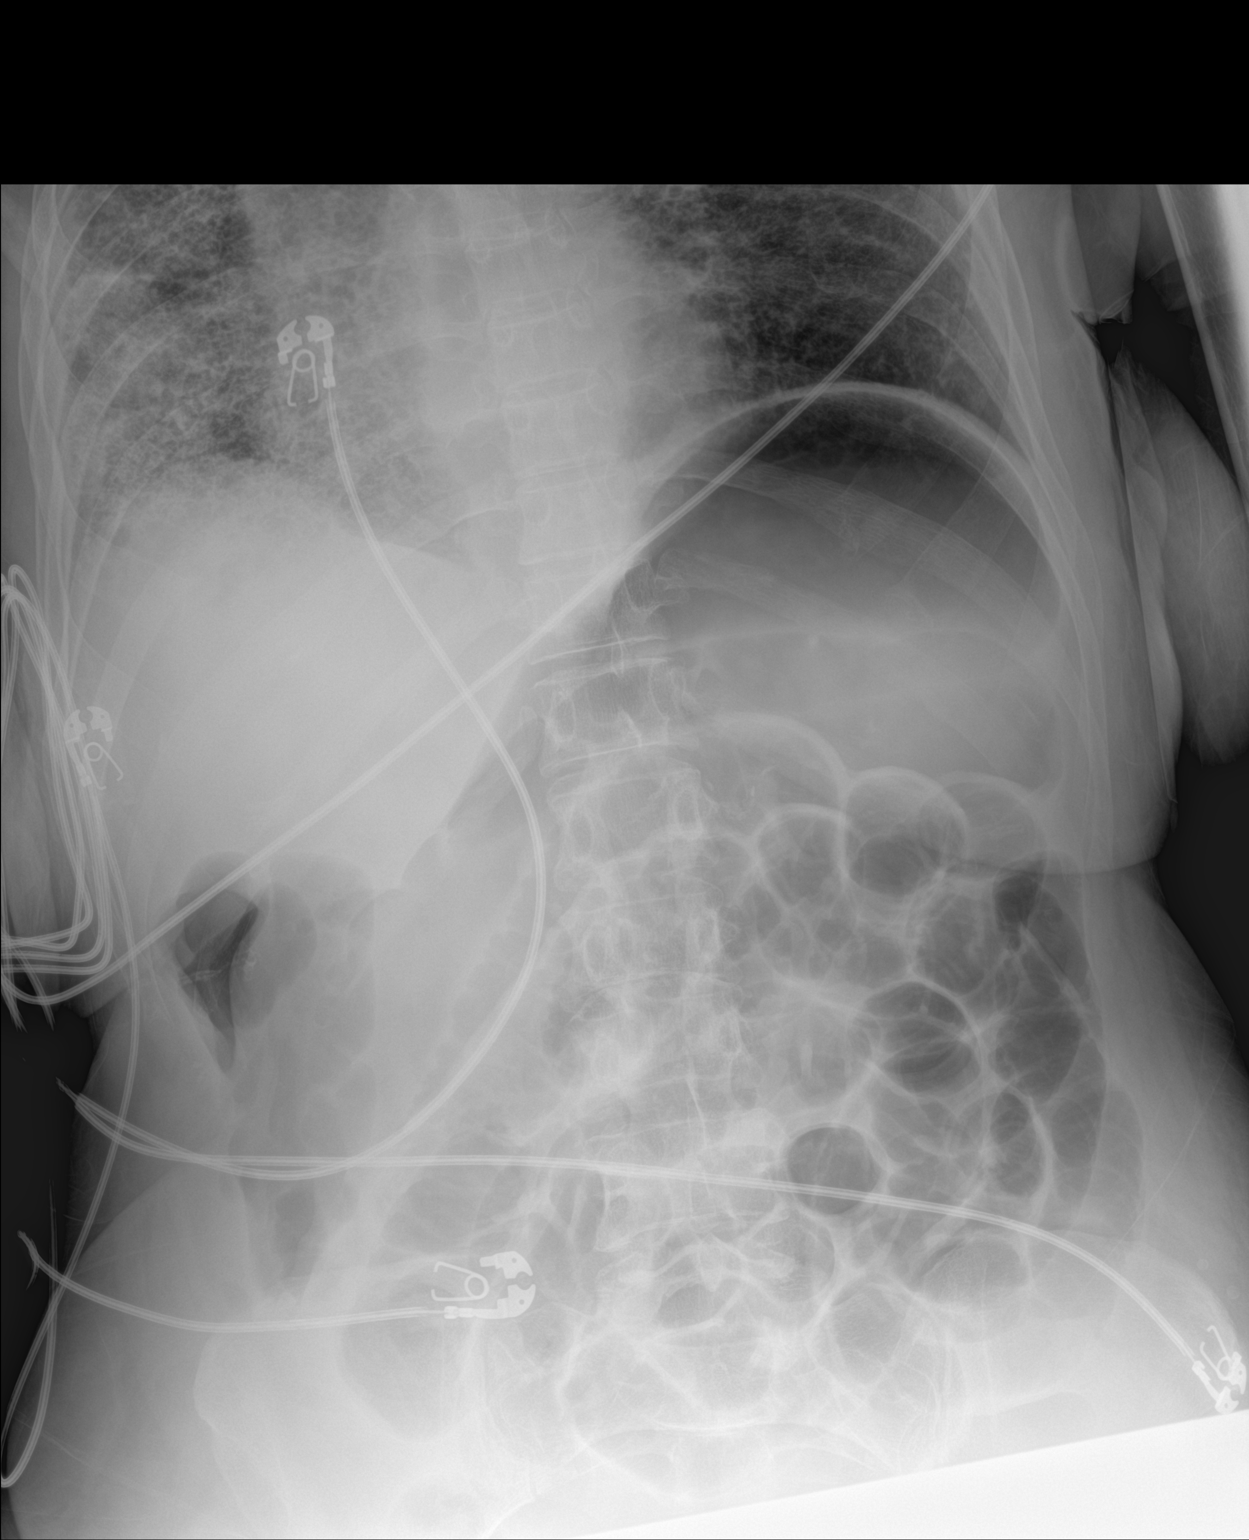

[1 of 1 positions shown; findings below may reference images not displayed]

FINDINGS: Stomach and small bowel are distended with air. No evidence of small
bowel obstruction. No convincing evidence of free intraperitoneal
air.
IMPRESSION: No convincing evidence of bowel perforation on this semi-erect plain
film examination of the abdomen. If clinical concern for bowel
perforation persists, would consider additional decubitus view.

## 2020-04-04 IMAGING — DX DG CHEST 1V PORT
1 series · 1 of 1 positions shown · non-contrast
Comparison: Chest x-ray from earlier same day.

CLINICAL DATA: Respiratory failure

EXAM:
PORTABLE CHEST 1 VIEW

[chest ap]
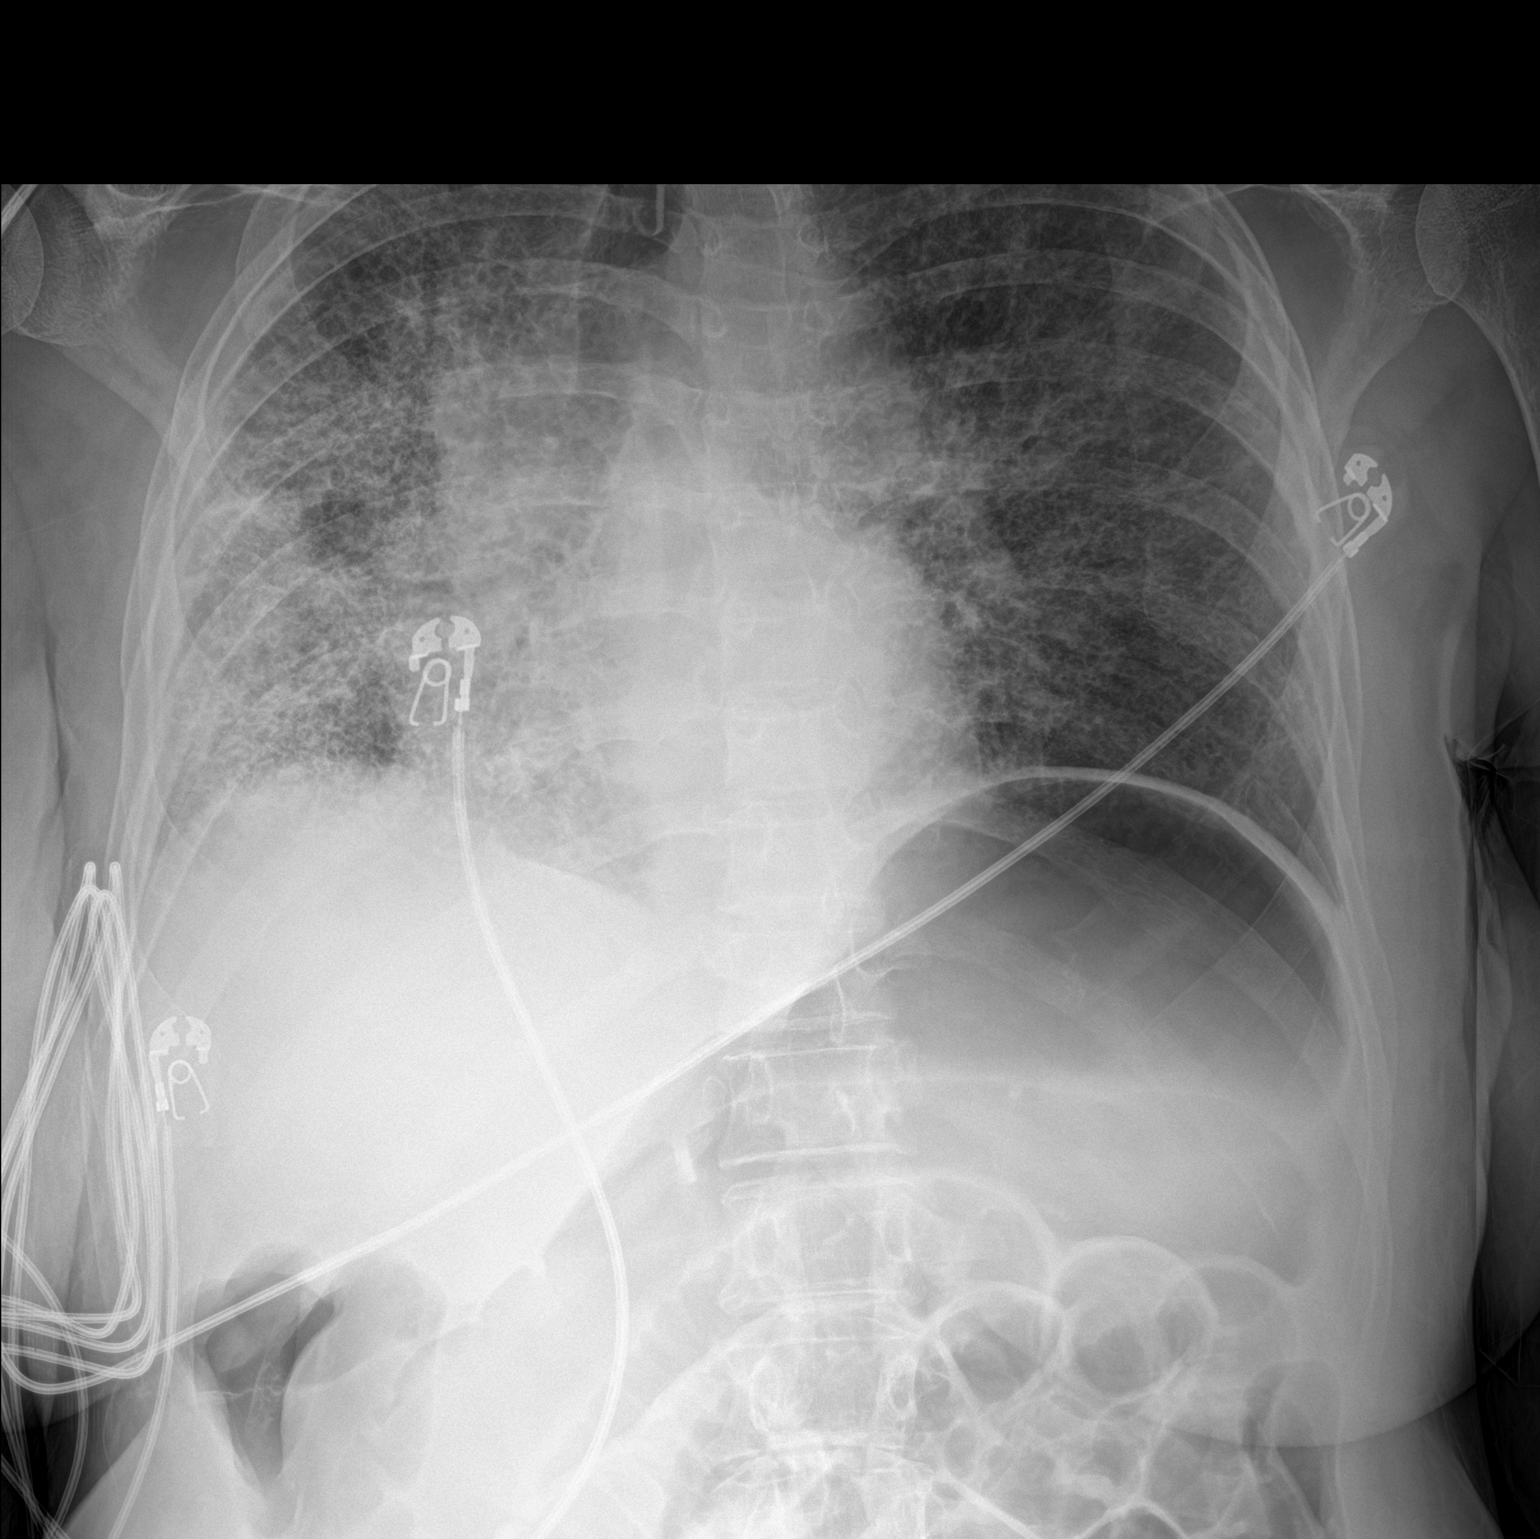

[1 of 1 positions shown; findings below may reference images not displayed]

FINDINGS: Endotracheal tube has been placed with tip well-positioned at
approximately 3 cm above the carina. Heart size and mediastinal
contours are stable in the short-term interval. The bilateral
reticulonodular opacities are unchanged in the short-term interval.
No pleural effusion or pneumothorax seen.
IMPRESSION: 1. Endotracheal tube well positioned with tip approximately 3 cm
above the carina.
2. Otherwise stable exam.

## 2020-04-05 IMAGING — DX DG ABD PORTABLE 1V
1 series · 1 of 1 positions shown · non-contrast
Comparison: One view abdomen 05/19/2018.

CLINICAL DATA: Orogastric tube placement.

EXAM:
PORTABLE ABDOMEN - 1 VIEW

[abdomen kub]
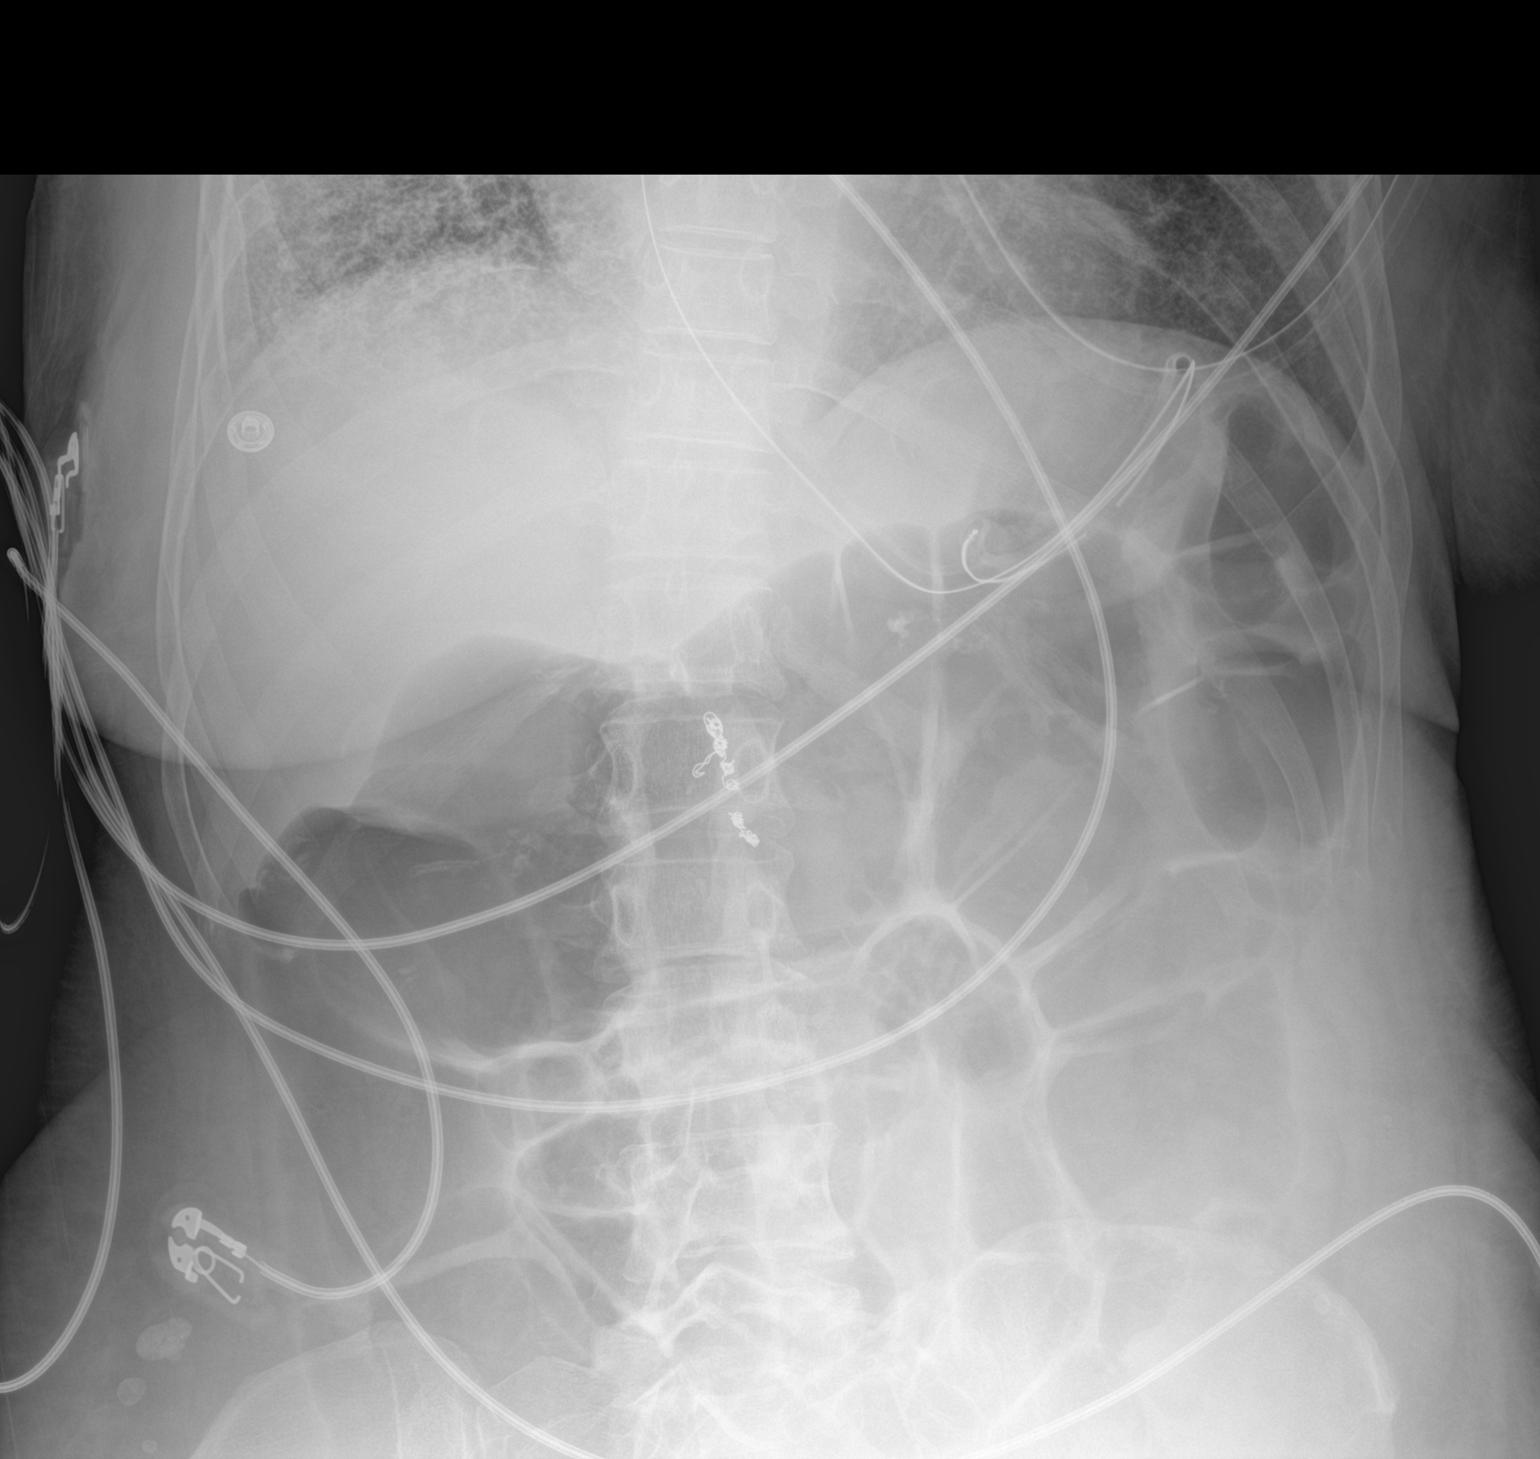

[1 of 1 positions shown; findings below may reference images not displayed]

FINDINGS: 6348 hours. Enteric tube is looped in the proximal stomach. Interval
decompression of the stomach. There is moderate distention of the
colon. Embolization clips overlie the upper lumbar spine. There are
fibrotic changes at both lung bases.
IMPRESSION: Enteric tube is coiled in the proximal stomach with good
decompression of the stomach. Increased colonic distension, likely
ileus.

## 2020-07-27 IMAGING — CR PORTABLE CHEST - 1 VIEW
1 series · 2 of 2 positions shown · non-contrast
Comparison: 09/09/2018

CLINICAL DATA: Cough and congestion

EXAM:
PORTABLE CHEST 1 VIEW

[Series 1: portable · 0.17mm/px · 2 of 2 slices shown]
[im 1/2]
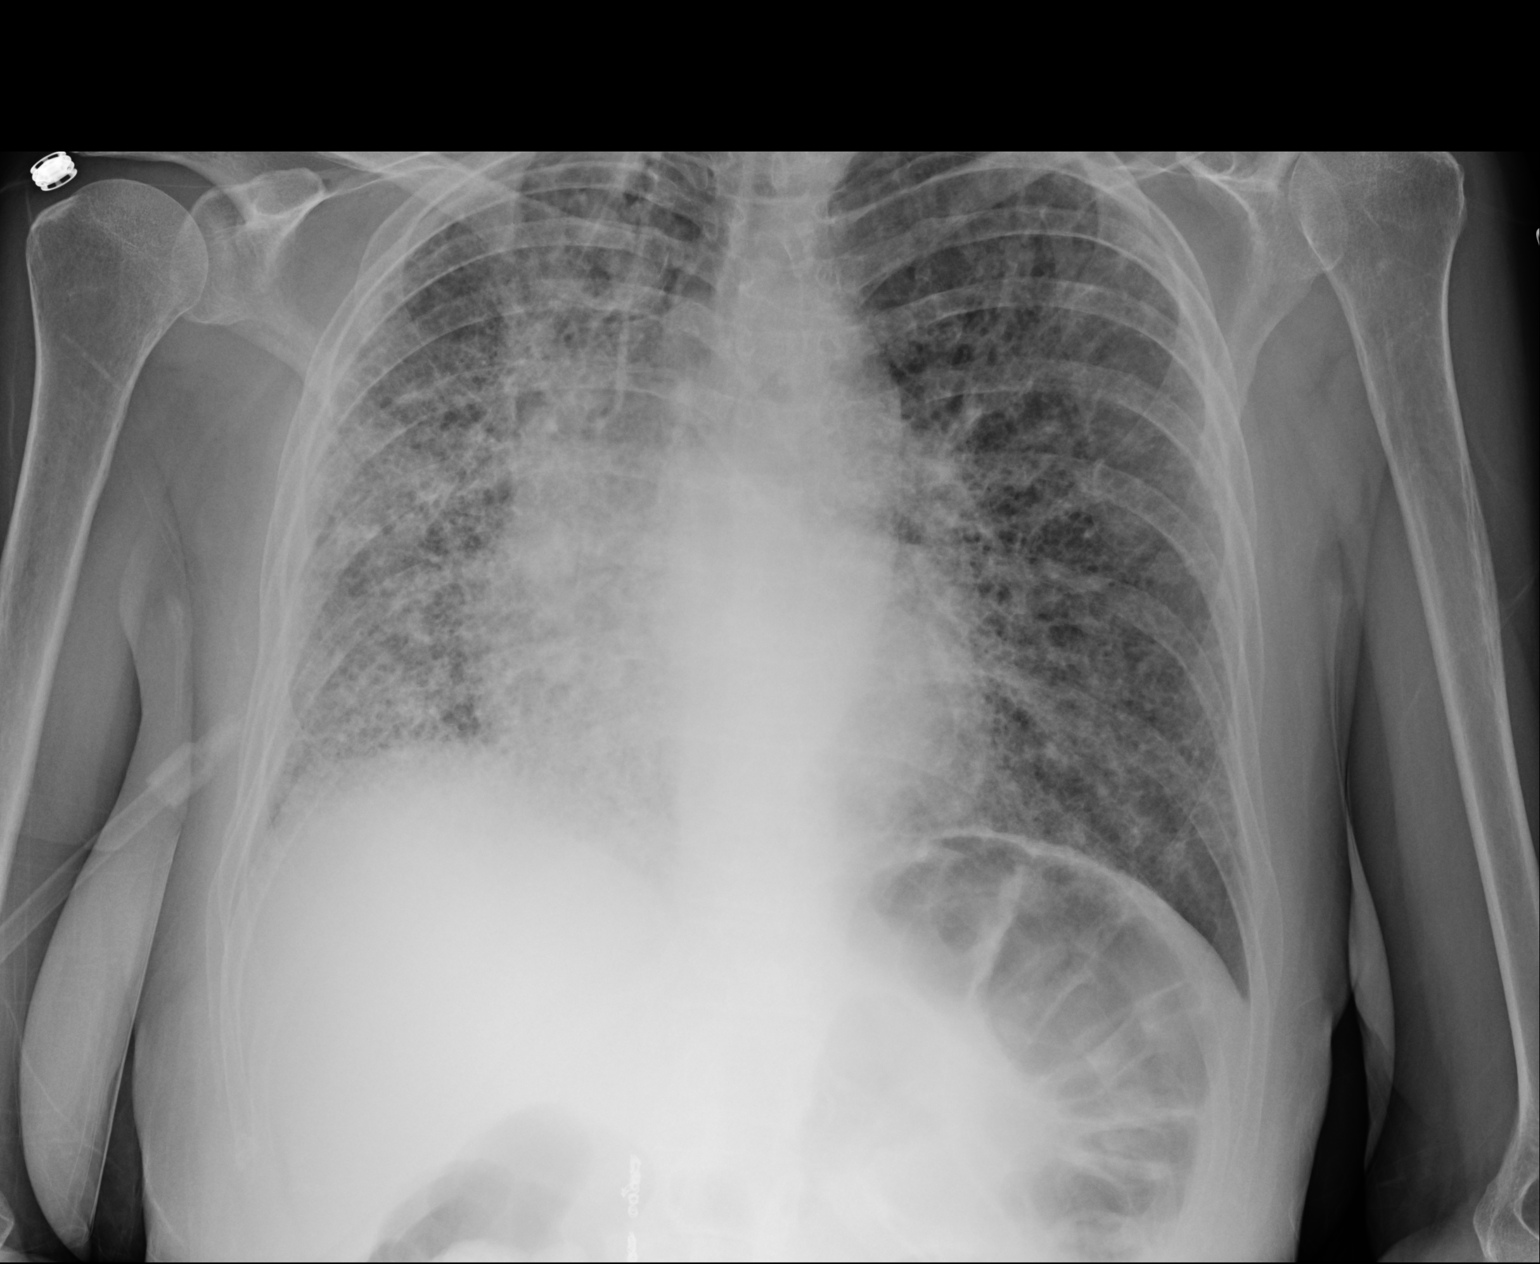
[im 2/2]
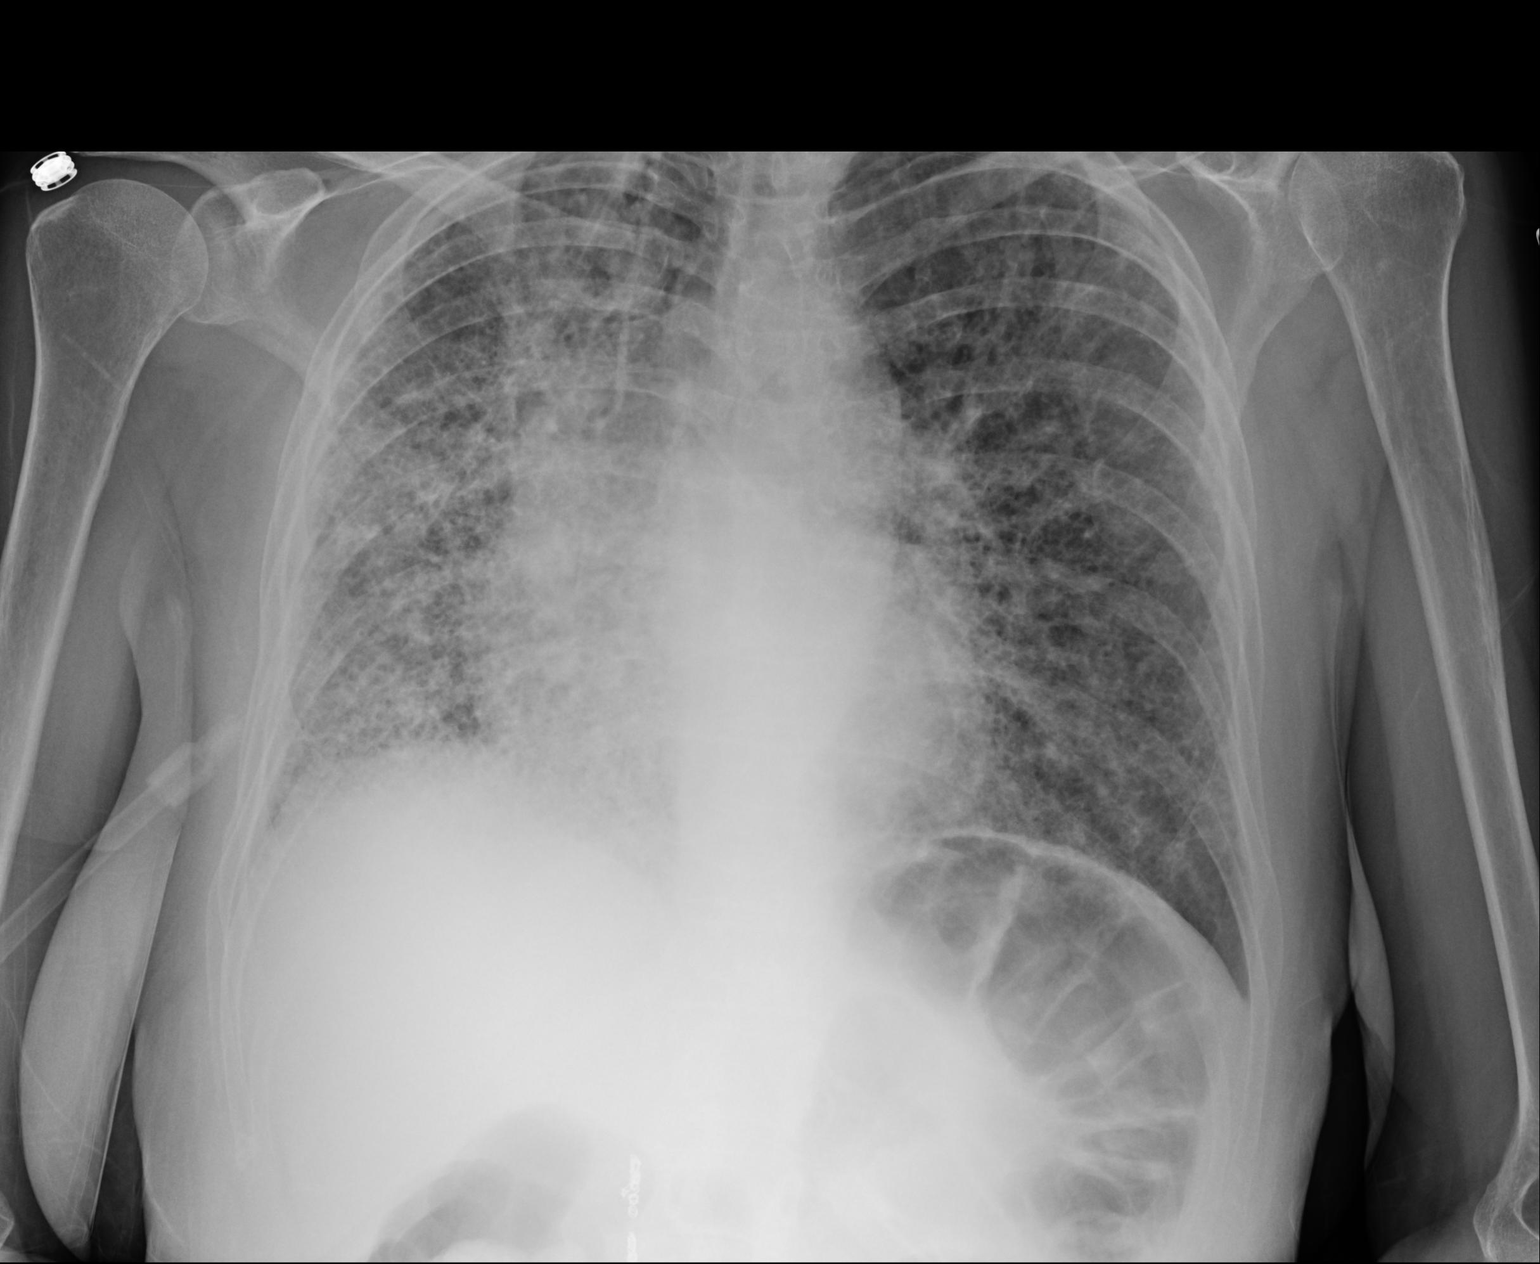

[2 of 2 positions shown; findings below may reference images not displayed]

FINDINGS: Cardiac shadow is stable. Diffuse bilateral airspace opacities are
again identified stable. No new focal infiltrate is seen. No bony
abnormality is noted.
IMPRESSION: Bilateral airspace opacities stable from the previous exam.

## 2020-07-29 IMAGING — DX CHEST  1 VIEW
1 series · 1 of 1 positions shown · non-contrast
Comparison: 09/11/2018 chest radiograph.

CLINICAL DATA: Chest wall pain

EXAM:
CHEST  1 VIEW

[chest pa]
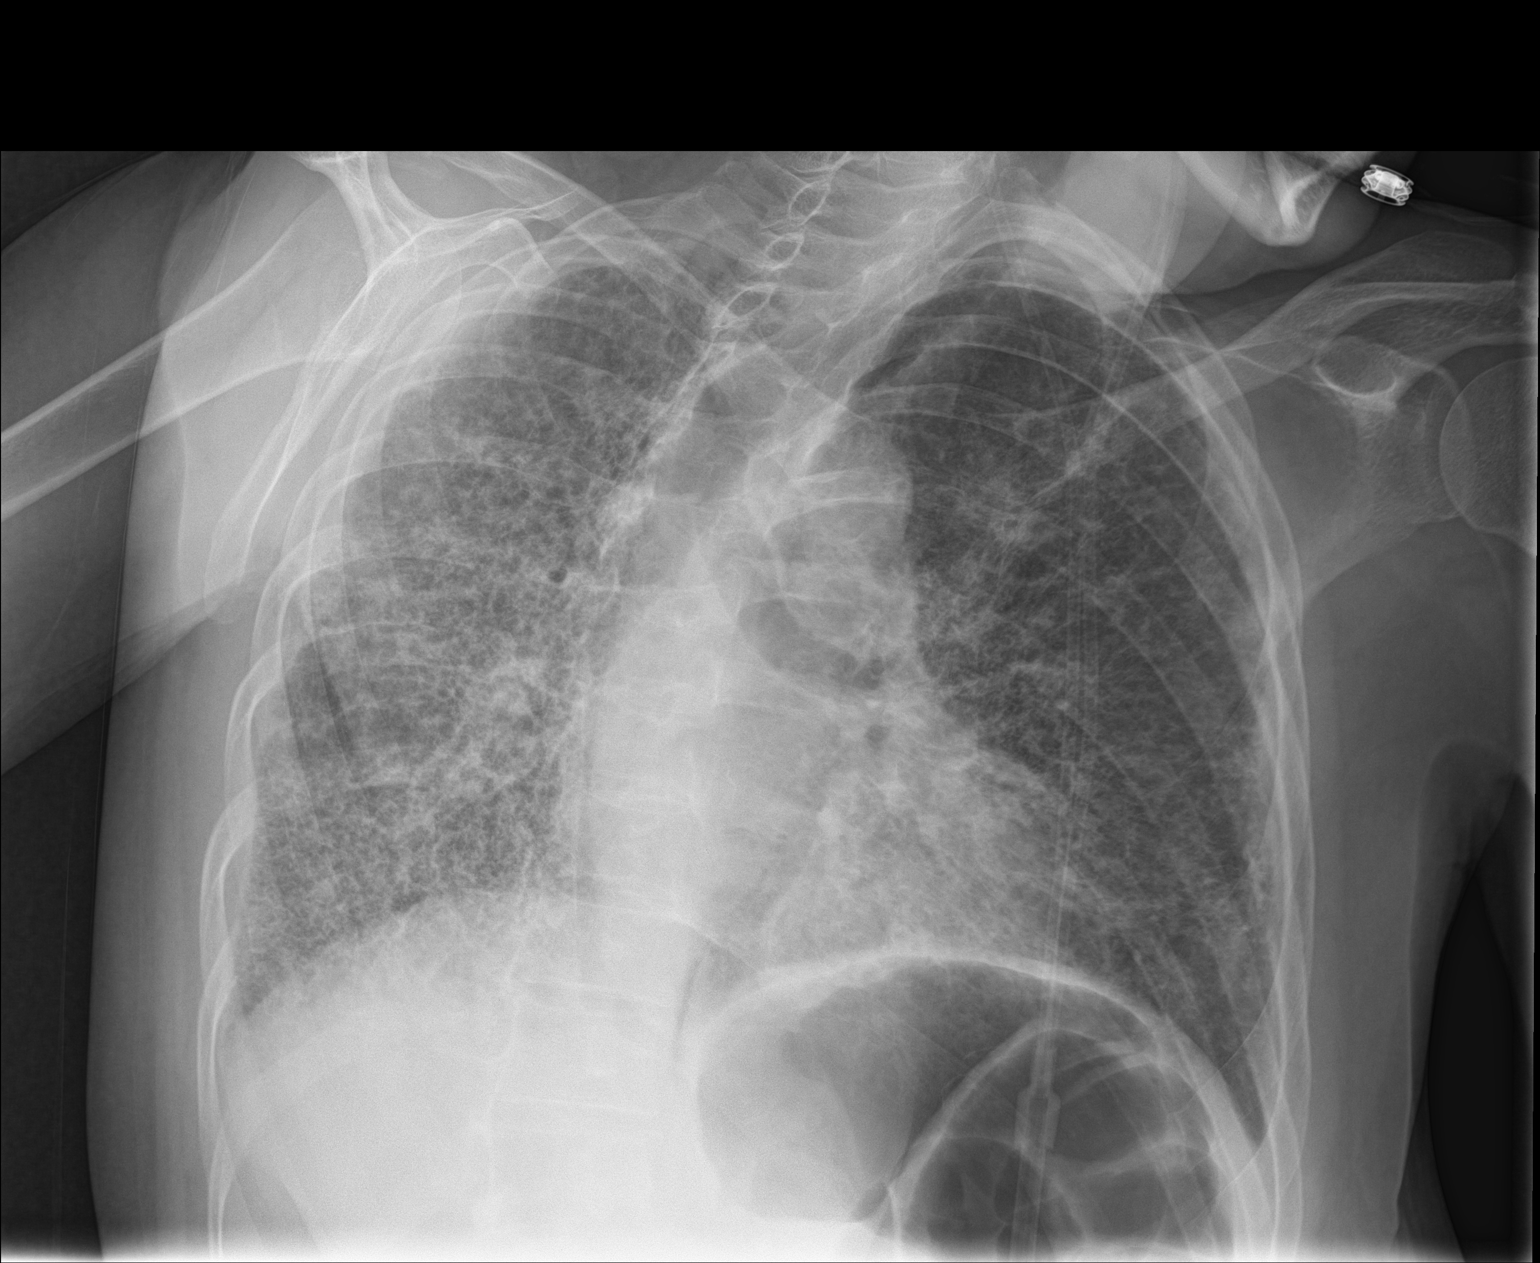

[1 of 1 positions shown; findings below may reference images not displayed]

FINDINGS: Left rotated chest radiograph stable cardiomediastinal silhouette
with normal heart size. No pneumothorax. No pleural effusion.
Extensive patchy reticular opacity throughout both lungs, right
greater than left, not substantially changed accounting for
differences in patient position.
IMPRESSION: No appreciable change in extensive patchy reticular opacities
throughout the right greater than left lungs, compatible with
advanced fibrotic interstitial lung disease as seen on 07/27/2018
chest CT.
